# Patient Record
Sex: Male | Born: 1955 | Race: Black or African American | Hispanic: No | Marital: Single | Smoking: Current every day smoker
Health system: Southern US, Community
[De-identification: ages and names within clinical notes are randomized; demographics above are authoritative.]

## PROBLEM LIST (undated history)

## (undated) DIAGNOSIS — J449 Chronic obstructive pulmonary disease, unspecified: Secondary | ICD-10-CM

## (undated) DIAGNOSIS — E119 Type 2 diabetes mellitus without complications: Secondary | ICD-10-CM

## (undated) DIAGNOSIS — I10 Essential (primary) hypertension: Secondary | ICD-10-CM

## (undated) DIAGNOSIS — Z720 Tobacco use: Secondary | ICD-10-CM

## (undated) DIAGNOSIS — I2510 Atherosclerotic heart disease of native coronary artery without angina pectoris: Secondary | ICD-10-CM

## (undated) DIAGNOSIS — K219 Gastro-esophageal reflux disease without esophagitis: Secondary | ICD-10-CM

## (undated) DIAGNOSIS — M719 Bursopathy, unspecified: Secondary | ICD-10-CM

## (undated) DIAGNOSIS — C3490 Malignant neoplasm of unspecified part of unspecified bronchus or lung: Secondary | ICD-10-CM

## (undated) DIAGNOSIS — Z8546 Personal history of malignant neoplasm of prostate: Secondary | ICD-10-CM

## (undated) DIAGNOSIS — I739 Peripheral vascular disease, unspecified: Secondary | ICD-10-CM

## (undated) DIAGNOSIS — E785 Hyperlipidemia, unspecified: Secondary | ICD-10-CM

## (undated) HISTORY — DX: Gastro-esophageal reflux disease without esophagitis: K21.9

## (undated) HISTORY — DX: Peripheral vascular disease, unspecified: I73.9

## (undated) HISTORY — DX: Essential (primary) hypertension: I10

## (undated) HISTORY — DX: Hyperlipidemia, unspecified: E78.5

## (undated) HISTORY — DX: Atherosclerotic heart disease of native coronary artery without angina pectoris: I25.10

## (undated) HISTORY — DX: Personal history of malignant neoplasm of prostate: Z85.46

## (undated) HISTORY — DX: Tobacco use: Z72.0

---

## 2011-11-01 HISTORY — PX: STENT PLACEMENT VASCULAR (ARMC HX): HXRAD1737

## 2013-03-31 HISTORY — PX: FEMORAL ARTERY STENT: SHX1583

## 2014-12-30 HISTORY — PX: CORONARY STENT PLACEMENT: SHX1402

## 2015-10-23 ENCOUNTER — Ambulatory Visit (INDEPENDENT_AMBULATORY_CARE_PROVIDER_SITE_OTHER): Payer: Medicare Other | Admitting: Urology

## 2015-10-23 DIAGNOSIS — N50811 Right testicular pain: Secondary | ICD-10-CM | POA: Diagnosis not present

## 2015-10-23 DIAGNOSIS — C61 Malignant neoplasm of prostate: Secondary | ICD-10-CM

## 2015-10-23 DIAGNOSIS — N5201 Erectile dysfunction due to arterial insufficiency: Secondary | ICD-10-CM | POA: Diagnosis not present

## 2015-11-26 ENCOUNTER — Ambulatory Visit: Payer: Medicare Other

## 2015-11-26 DIAGNOSIS — M25512 Pain in left shoulder: Secondary | ICD-10-CM

## 2015-12-10 ENCOUNTER — Ambulatory Visit: Payer: Medicare Other | Admitting: Orthopaedic Surgery

## 2015-12-10 ENCOUNTER — Encounter: Payer: Self-pay | Admitting: Orthopaedic Surgery

## 2015-12-23 ENCOUNTER — Ambulatory Visit (INDEPENDENT_AMBULATORY_CARE_PROVIDER_SITE_OTHER): Payer: Medicare Other | Admitting: Orthopaedic Surgery

## 2015-12-23 ENCOUNTER — Encounter: Payer: Self-pay | Admitting: Orthopaedic Surgery

## 2015-12-23 VITALS — BP 135/80 | HR 89 | Temp 97.9°F | Ht 68.0 in | Wt 166.8 lb

## 2015-12-23 DIAGNOSIS — M25512 Pain in left shoulder: Secondary | ICD-10-CM | POA: Diagnosis not present

## 2015-12-23 MED ORDER — HYDROCODONE-ACETAMINOPHEN 5-325 MG PO TABS: 1.0000 | ORAL_TABLET | ORAL | Status: DC | PRN

## 2015-12-23 MED ORDER — DICLOFENAC SODIUM 75 MG PO TBEC: 75.0000 mg | DELAYED_RELEASE_TABLET | Freq: Two times a day (BID) | ORAL | Status: DC

## 2015-12-23 NOTE — Progress Notes (Signed)
Patient Fred Hernandez:OINOM Womac, male DOB:Nov 10, 1955, 60 y.o. VEH:[id number]  Chief Complaint  Patient presents with  . Results    MRI Left shoulder    HPI  Fred Hernandez is a 60 y.o. male who has chronic left shoulder pain for 8 months.  He had a MRI recently at [place].  He continues to have pain;  Shoulder Pain  The pain is present in the left shoulder. This is a chronic problem. The current episode started more than 1 month ago. There has been no history of extremity trauma. The problem occurs daily. The problem has been gradually worsening. The quality of the pain is described as aching and dull. The pain is at a severity of 5/10. The pain is moderate. Associated symptoms include a limited range of motion. Pertinent negatives include no numbness, stiffness or tingling. The symptoms are aggravated by activity and cold. He has tried cold, NSAIDS, oral narcotics and rest for the symptoms. The treatment provided mild relief. His past medical history is significant for diabetes.    Body mass index is 25.37 kg/(m^2).   Review of Systems  Constitutional:       Patient has Diabetes Mellitus. Patient has hypertension. Patient has COPD or shortness of breath. Patient does not have BMI > 35. Patient has current smoking history  Musculoskeletal: Positive for myalgias and arthralgias. Negative for stiffness.  Neurological: Negative for tingling and numbness.    Past Medical History  Diagnosis Date  . Hypertension   . Diabetes [facility])     Past Surgical History  Procedure Laterality Date  . Stent placement vascular ([facility])    . Coronary stent placement      No family history on file.  Social History Social History  Substance Use Topics  . Smoking status: Not on file  . Smokeless tobacco: Not on file  . Alcohol Use: Not on file    Allergies  Allergen Reactions  . Lisinopril Anaphylaxis  . Penicillins Anaphylaxis  . Ace Inhibitors Swelling    Current Outpatient Prescriptions   Medication Sig Dispense Refill  . aspirin 81 MG tablet Take 81 mg by mouth daily.    . clopidogrel (PLAVIX) 75 MG tablet Take 75 mg by mouth daily.    . metFORMIN (GLUCOPHAGE) 500 MG tablet Take 500 mg by mouth 2 (two) times daily with a meal.    . metoprolol tartrate (LOPRESSOR) 25 MG tablet Take 25 mg by mouth 2 (two) times daily.    . Omega-3 Fatty Acids (FISH OIL PO) Take by mouth.    . ranitidine (ZANTAC) 150 MG capsule Take 150 mg by mouth 2 (two) times daily.    . diclofenac (VOLTAREN) 75 MG EC tablet Take 1 tablet (75 mg total) by mouth 2 (two) times daily with a meal. 60 tablet 2  . HYDROcodone-acetaminophen (NORCO/VICODIN) 5-325 MG tablet Take 1 tablet by mouth every 4 (four) hours as needed for moderate pain (Must last 30 days.  Do not take and drive a car or use machinery.). 120 tablet 0   No current facility-administered medications for this visit.     Physical Exam  Blood pressure 135/80, pulse 89, temperature 97.9 F (36.6 C), height '5\' 8"'$  (1.727 m), weight 166 lb 12.8 oz (75.66 kg).  Constitutional: overall normal hygiene, normal nutrition, well developed, normal grooming, normal body habitus. Assistive device:none  Musculoskeletal: gait and station Limp none, muscle tone and strength are normal, no tremors or atrophy is present.  .  Neurological: coordination overall normal.  Deep tendon reflex/nerve stretch intact.  Sensation normal.  Cranial nerves II-XII intact.   Skin:   normal overall no scars, lesions, ulcers or rashes. No psoriasis.  Psychiatric: Alert and oriented x 3.  Recent memory intact, remote memory unclear.  Normal mood and affect. Well groomed.  Good eye contact.  Cardiovascular: overall no swelling, no varicosities, no edema bilaterally, normal temperatures of the legs and arms, no clubbing, cyanosis and good capillary refill.  Lymphatic: palpation is normal.  Examination of left Upper Extremity is done.  Inspection:   Overall:  Elbow non-tender  without crepitus or defects, forearm non-tender without crepitus or defects, wrist non-tender without crepitus or defects, hand non-tender.    Shoulder: with glenohumeral joint tenderness, without effusion.   Upper arm: without swelling and tenderness   Range of motion:   Overall:  Full range of motion of the elbow, full range of motion of wrist and full range of motion in fingers.   Shoulder:  left  100 degrees forward flexion; 75 degrees abduction; 30 degrees internal rotation, 30 degrees external rotation, 15 degrees extension, 40 degrees adduction.   Stability:   Overall:  Shoulder, elbow and wrist stable   Strength and Tone:   Overall full shoulder muscles strength, full upper arm strength and normal upper arm bulk and tone. Extremities:the right shoulder has full motion, no pain Inspection normal right shoulder Strength and tone normal right shoulder Range of motion full right shoulder  Additional services performed: I reviewed the MRI from [facility] and the report.  I went over the findings with him.  It was normal.  I will begin PT for him.  He has had medicines, injections, normal x-rays and normal MRI.  He continues to have pain.  I will switch to diclofenac as his Mobic has not helped.  I have gone over exercises with him as well.  He needs to cut back on smoking. He does not feel he can.  His diabetes is well controlled.  He is monitoring it well.  The patient has been educated about the nature of the problem(s) and counseled on treatment options.  The patient appeared to understand what I have discussed and is in agreement with it.  PLAN Call if any problems.  Precautions discussed.  Continue current medications.   Return to clinic 2 wks

## 2015-12-23 NOTE — Patient Instructions (Addendum)
Call [facility] in [place] to schedule your therapy visits  Stop Meloxicam, start Diclofenac  Use heat for comfort, no more than 30 minutes at a time

## 2015-12-30 ENCOUNTER — Ambulatory Visit: Payer: Medicare Other | Admitting: Orthopaedic Surgery

## 2016-01-01 ENCOUNTER — Encounter: Payer: Self-pay | Admitting: Orthopaedic Surgery

## 2016-01-06 ENCOUNTER — Ambulatory Visit: Payer: Medicare Other | Admitting: Orthopaedic Surgery

## 2016-01-14 ENCOUNTER — Other Ambulatory Visit: Payer: Self-pay | Admitting: Orthopaedic Surgery

## 2016-01-14 DIAGNOSIS — M25512 Pain in left shoulder: Secondary | ICD-10-CM

## 2016-01-22 ENCOUNTER — Ambulatory Visit (INDEPENDENT_AMBULATORY_CARE_PROVIDER_SITE_OTHER): Payer: Medicare Other | Admitting: Urology

## 2016-01-22 DIAGNOSIS — C61 Malignant neoplasm of prostate: Secondary | ICD-10-CM

## 2016-01-22 DIAGNOSIS — N50811 Right testicular pain: Secondary | ICD-10-CM | POA: Diagnosis not present

## 2016-01-22 DIAGNOSIS — N5201 Erectile dysfunction due to arterial insufficiency: Secondary | ICD-10-CM

## 2016-01-26 ENCOUNTER — Ambulatory Visit (INDEPENDENT_AMBULATORY_CARE_PROVIDER_SITE_OTHER): Payer: Medicare Other | Admitting: Orthopaedic Surgery

## 2016-01-26 ENCOUNTER — Encounter: Payer: Self-pay | Admitting: Orthopaedic Surgery

## 2016-01-26 VITALS — BP 133/77 | HR 78 | Temp 97.5°F | Resp 16 | Ht 68.0 in | Wt 170.6 lb

## 2016-01-26 DIAGNOSIS — M25512 Pain in left shoulder: Secondary | ICD-10-CM | POA: Diagnosis not present

## 2016-01-26 MED ORDER — HYDROCODONE-ACETAMINOPHEN 5-325 MG PO TABS: 1.0000 | ORAL_TABLET | ORAL | Status: DC | PRN

## 2016-01-26 NOTE — Progress Notes (Signed)
Patient Fred Hernandez, male DOB:Aug 03, 1956, 60 y.o. EXB:[id number]  Chief Complaint  Patient presents with  . Shoulder Pain    left    HPI  Fred Hernandez is a 60 y.o. male who has left shoulder pain. He has been going to PT and is making some progress.  His MRI done prior was negative.  He has some exercises to do at home and he has started doing them.  He has no new trauma.He has no paresthesia.  He says he sees some improvement.  He has no new pains.  HPI  Body mass index is 25.95 kg/(m^2).  Review of Systems  Constitutional:       Patient has Diabetes Mellitus. Patient has hypertension. Patient has COPD or shortness of breath. Patient does not have BMI > 35. Patient has current smoking history  HENT: Negative for congestion.   Cardiovascular: Negative for chest pain.  Endocrine: Positive for cold intolerance.  Musculoskeletal: Positive for myalgias and arthralgias.  Allergic/Immunologic: Positive for environmental allergies.  Neurological: Negative for numbness.    Past Medical History  Diagnosis Date  . Hypertension   . Diabetes [facility])     Past Surgical History  Procedure Laterality Date  . Stent placement vascular ([facility])    . Coronary stent placement      No family history on file.  Social History Social History  Substance Use Topics  . Smoking status: Current Every Day Smoker  . Smokeless tobacco: Never Used  . Alcohol Use: 0.0 oz/week    0 Standard drinks or equivalent per week     Comment: socially    Allergies  Allergen Reactions  . Lisinopril Anaphylaxis  . Penicillins Anaphylaxis  . Ace Inhibitors Swelling    Current Outpatient Prescriptions  Medication Sig Dispense Refill  . aspirin 81 MG tablet Take 81 mg by mouth daily.    [name] atorvastatin (LIPITOR) 80 MG tablet TK 1 T PO QD HS  1  . clopidogrel (PLAVIX) 75 MG tablet Take 75 mg by mouth daily.    . diclofenac (VOLTAREN) 75 MG EC tablet Take 1 tablet (75 mg total) by mouth 2 (two) times daily  with a meal. 60 tablet 2  . fluticasone (FLONASE) 50 MCG/ACT nasal spray SHAKE LQ AND U 1 SPR IEN QD  3  . HYDROcodone-acetaminophen (NORCO/VICODIN) 5-325 MG tablet Take 1 tablet by mouth every 4 (four) hours as needed for moderate pain (Must last 30 days.  Do not take and drive a car or use machinery.). 120 tablet 0  . metFORMIN (GLUCOPHAGE) 500 MG tablet Take 500 mg by mouth 2 (two) times daily with a meal.    . metoprolol tartrate (LOPRESSOR) 25 MG tablet Take 25 mg by mouth 2 (two) times daily.    . Omega-3 Fatty Acids (FISH OIL PO) Take by mouth.    [name] omeprazole (PRILOSEC) 20 MG capsule TK 2 CS PO QD  1  . ranitidine (ZANTAC) 150 MG capsule Take 150 mg by mouth 2 (two) times daily.     No current facility-administered medications for this visit.     Physical Exam  Blood pressure 133/77, pulse 78, temperature 97.5 F (36.4 C), temperature source Tympanic, resp. rate 16, height '5\' 8"'$  (1.727 m), weight 170 lb 9.6 oz (77.384 kg).  Constitutional: overall normal hygiene, normal nutrition, well developed, normal grooming, normal body habitus. Assistive device:none  Musculoskeletal: gait and station Limp none, muscle tone and strength are normal, no tremors or atrophy is present.  [name]  Neurological: coordination overall normal.  Deep tendon reflex/nerve stretch intact.  Sensation normal.  Cranial nerves II-XII intact.   Skin:   normal overall no scars, lesions, ulcers or rashes. No psoriasis.  Psychiatric: Alert and oriented x 3.  Recent memory intact, remote memory unclear.  Normal mood and affect. Well groomed.  Good eye contact.  Cardiovascular: overall no swelling, no varicosities, no edema bilaterally, normal temperatures of the legs and arms, no clubbing, cyanosis and good capillary refill.  Lymphatic: palpation is normal.  Examination of left Upper Extremity is done.  Inspection:   Overall:  Elbow non-tender without crepitus or defects, forearm non-tender without crepitus or  defects, wrist non-tender without crepitus or defects, hand non-tender.    Shoulder: with glenohumeral joint tenderness, without effusion.   Upper arm: without swelling and tenderness   Range of motion:   Overall:  Full range of motion of the elbow, full range of motion of wrist and full range of motion in fingers.   Shoulder:  left  135 degrees forward flexion; 100 degrees abduction; 35 degrees internal rotation, 35 degrees external rotation, 20 degrees extension, 40 degrees adduction.   Stability:   Overall:  Shoulder, elbow and wrist stable   Strength and Tone:   Overall full shoulder muscles strength, full upper arm strength and normal upper arm bulk and tone.   The patient has been educated about the nature of the problem(s) and counseled on treatment options.  The patient appeared to understand what I have discussed and is in agreement with it.  Encounter Diagnosis  Name Primary?  . Left shoulder pain Yes    PLAN Call if any problems.  Precautions discussed.  Continue current medications.   Continue PT.  Return to clinic 3 weeks

## 2016-02-09 ENCOUNTER — Telehealth: Payer: Self-pay | Admitting: Orthopaedic Surgery

## 2016-02-09 NOTE — Telephone Encounter (Signed)
Routing to DR. Luna Glasgow

## 2016-02-09 NOTE — Telephone Encounter (Signed)
[facility] representative called and stated they are in need of new PT order.  Please fax to [phone]

## 2016-02-10 NOTE — Telephone Encounter (Signed)
Do it

## 2016-02-16 ENCOUNTER — Ambulatory Visit: Payer: Medicare Other | Admitting: Orthopaedic Surgery

## 2016-02-18 ENCOUNTER — Encounter: Payer: Self-pay | Admitting: *Deleted

## 2016-02-22 ENCOUNTER — Encounter: Payer: Self-pay | Admitting: Cardiovascular Disease

## 2016-02-22 ENCOUNTER — Ambulatory Visit (INDEPENDENT_AMBULATORY_CARE_PROVIDER_SITE_OTHER): Payer: Medicare Other | Admitting: Cardiovascular Disease

## 2016-02-22 ENCOUNTER — Encounter: Payer: Self-pay | Admitting: *Deleted

## 2016-02-22 VITALS — BP 118/77 | HR 71 | Ht 69.0 in | Wt 178.0 lb

## 2016-02-22 DIAGNOSIS — I739 Peripheral vascular disease, unspecified: Secondary | ICD-10-CM

## 2016-02-22 DIAGNOSIS — I25810 Atherosclerosis of coronary artery bypass graft(s) without angina pectoris: Secondary | ICD-10-CM | POA: Diagnosis not present

## 2016-02-22 DIAGNOSIS — Z955 Presence of coronary angioplasty implant and graft: Secondary | ICD-10-CM

## 2016-02-22 DIAGNOSIS — E785 Hyperlipidemia, unspecified: Secondary | ICD-10-CM | POA: Diagnosis not present

## 2016-02-22 DIAGNOSIS — I2510 Atherosclerotic heart disease of native coronary artery without angina pectoris: Secondary | ICD-10-CM | POA: Diagnosis not present

## 2016-02-22 DIAGNOSIS — Z716 Tobacco abuse counseling: Secondary | ICD-10-CM

## 2016-02-22 NOTE — Patient Instructions (Signed)
   Referral to Dr. Gwenlyn Found or Dr. Fletcher Anon for peripheral vascular disease. Continue all current medications. Your physician has requested that you have an ankle brachial index (ABI). During this test an ultrasound and blood pressure cuff are used to evaluate the arteries that supply the arms and legs with blood. Allow thirty minutes for this exam. There are no restrictions or special instructions. Office will contact with results via phone or letter.   Your physician wants you to follow up in:  1 year.  You will receive a reminder letter in the mail one-two months in advance.  If you don't receive a letter, please call our office to schedule the follow up appointment

## 2016-02-22 NOTE — Progress Notes (Signed)
Patient ID: Fred Hernandez, male   DOB: 07/22/1956, 60 y.o.   MRN: [id number]       CARDIOLOGY CONSULT NOTE  Patient ID: Fred Hernandez MRN: [id number] DOB/AGE: [birth year] 60 y.o.  Admit date: (Not on file) Primary Physician: [facility] Referring Physician:   Reason for Consultation: PVD  HPI: The patientIs a 60 year old male with a history of coronary artery disease and stent placement (?artery) in March 2016, diabetes, hyperlipidemia, and tobacco abuse. He is referred today for right thigh and calf pain with walking.  He apparently has also undergone stenting of both legs. I do not have these records and it was reportedly performed in [place], [place]. He denies exertional chest pain. He has chronic exertional dyspnea which is stable. He smokes 1.5 packs of cigarettes daily. For the past one month, he has had tightening and cramping in his right calf and his right hamstring with walking. He said he can't stop smoking.  ECG performed in the office today which I personally reviewed demonstrates normal sinus rhythm with no ischemic ST segment or T-wave abnormalities, nor any arrhythmias.     Allergies  Allergen Reactions  . Lisinopril Anaphylaxis  . Penicillins Anaphylaxis  . Ace Inhibitors Swelling    Current Outpatient Prescriptions  Medication Sig Dispense Refill  . aspirin 81 MG tablet Take 81 mg by mouth daily.    [name] atorvastatin (LIPITOR) 80 MG tablet Take 80 mg by mouth daily.    . clopidogrel (PLAVIX) 75 MG tablet Take 75 mg by mouth daily.    . fluticasone (FLONASE) 50 MCG/ACT nasal spray SHAKE LQ AND U 1 SPR IEN QD  3  . meloxicam (MOBIC) 7.5 MG tablet Take 1 tablet by mouth daily.  0  . metFORMIN (GLUCOPHAGE) 500 MG tablet Take 500 mg by mouth 2 (two) times daily with a meal.    . metoprolol succinate (TOPROL-XL) 50 MG 24 hr tablet Take 1 tablet by mouth daily.    . Omega-3 Fatty Acids (FISH OIL PO) Take 2 capsules by mouth 2 (two) times daily.      [name] omeprazole (PRILOSEC) 20 MG capsule Take 40 mg by mouth daily.     No current facility-administered medications for this visit.    Past Medical History  Diagnosis Date  . Hypertension   . Diabetes ([place])   . GERD (gastroesophageal reflux disease)   . History of prostate cancer   . CAD (coronary artery disease)   . Hyperlipemia   . Tobacco abuse     Past Surgical History  Procedure Laterality Date  . Stent placement vascular ([facility])  2013  . Coronary stent placement  12-2014    [facility]    Social History   Social History  . Marital Status: Single    Spouse Name: N/A  . Number of Children: N/A  . Years of Education: N/A   Occupational History  . Not on file.   Social History Main Topics  . Smoking status: Current Every Day Smoker -- 0.50 packs/day    Types: Cigarettes    Start date: 02/12/1971  . Smokeless tobacco: Never Used  . Alcohol Use: 0.0 oz/week    0 Standard drinks or equivalent per week     Comment: socially  . Drug Use: No  . Sexual Activity: Not on file   Other Topics Concern  . Not on file   Social History Narrative     No family history of premature CAD  in 1st degree relatives.  Prior to Admission medications   Medication Sig Start Date End Date Taking? Authorizing Provider  aspirin 81 MG tablet Take 81 mg by mouth daily.   Yes Historical Provider, MD  atorvastatin (LIPITOR) 80 MG tablet Take 80 mg by mouth daily.   Yes Historical Provider, MD  clopidogrel (PLAVIX) 75 MG tablet Take 75 mg by mouth daily.   Yes Historical Provider, MD  fluticasone (FLONASE) 50 MCG/ACT nasal spray SHAKE LQ AND U 1 SPR IEN QD 01/04/16  Yes Historical Provider, MD  meloxicam (MOBIC) 7.5 MG tablet Take 1 tablet by mouth daily. 01/27/16  Yes Historical Provider, MD  metFORMIN (GLUCOPHAGE) 500 MG tablet Take 500 mg by mouth 2 (two) times daily with a meal.   Yes Historical Provider, MD  metoprolol succinate (TOPROL-XL) 50 MG 24 hr tablet  Take 1 tablet by mouth daily. 02/08/16  Yes Historical Provider, MD  Omega-3 Fatty Acids (FISH OIL PO) Take 2 capsules by mouth 2 (two) times daily.    Yes Historical Provider, MD  omeprazole (PRILOSEC) 20 MG capsule Take 40 mg by mouth daily.   Yes Historical Provider, MD     Review of systems complete and found to be negative unless listed above in HPI     Physical exam Blood pressure 118/77, pulse 71, height '5\' 9"'$  (1.753 m), weight 178 lb (80.74 kg), SpO2 98 %. General: NAD Neck: No JVD, no thyromegaly or thyroid nodule.  Lungs: Diminished throughout, no rales/wheezes. CV: Nondisplaced PMI. Regular rate and rhythm, normal S1/S2, no S3/S4, no murmur.  No peripheral edema. Diminished pedal pulses on right, right calf cooler to palpation than left.  Abdomen: Soft, nontender, no distention.  Skin: Intact without lesions or rashes.  Neurologic: Alert and oriented x 3.  Psych: Normal affect. Extremities: No clubbing or cyanosis.  HEENT: Normal.   ECG: Most recent ECG reviewed.  Labs:  No results found for: WBC, HGB, HCT, MCV, PLT No results for input(s): NA, K, CL, CO2, BUN, CREATININE, CALCIUM, PROT, BILITOT, ALKPHOS, ALT, AST, GLUCOSE in the last 168 hours.  Invalid input(s): LABALBU No results found for: CKTOTAL, CKMB, CKMBINDEX, TROPONINI No results found for: CHOL No results found for: HDL No results found for: LDLCALC No results found for: TRIG No results found for: CHOLHDL No results found for: LDLDIRECT       Studies: No results found.  ASSESSMENT AND PLAN:  1. Right thigh and calf claudication in context of PVD with prior stenting: Will likely need angiography. I will obtain ABI's.  Continue aspirin, Plavix, and statin. I will make a referral to my colleagues who specialize in peripheral vascular disease. Tobacco cessation emphasized. Will obtain records from [place] to determine where stents were placed.  2. CAD with stent: Stable. Continue aspirin, statin, and  Plavix for the time being. Will obtain records from [facility] to determine which coronary artery was stented.  3. Hyperlipidemia: Continue high intensity statin therapy with Lipitor 80 mg.  4. Tobacco abuse: Cessation counseling provided (1 minute). Says he cannot quit.  Dispo: fu 1 year.   Signed: Kate Sable, M.D., F.A.C.C.  02/22/2016, 1:48 PM

## 2016-02-22 NOTE — Addendum Note (Signed)
Addended by: Laurine Blazer on: 02/22/2016 02:15 PM   Modules accepted: Orders

## 2016-02-24 ENCOUNTER — Encounter: Payer: Self-pay | Admitting: Orthopaedic Surgery

## 2016-02-24 ENCOUNTER — Ambulatory Visit (INDEPENDENT_AMBULATORY_CARE_PROVIDER_SITE_OTHER): Payer: Medicare Other | Admitting: Orthopaedic Surgery

## 2016-02-24 VITALS — BP 124/77 | HR 80 | Temp 97.7°F | Resp 16 | Ht 69.0 in | Wt 178.0 lb

## 2016-02-24 DIAGNOSIS — M25512 Pain in left shoulder: Secondary | ICD-10-CM | POA: Diagnosis not present

## 2016-02-24 MED ORDER — HYDROCODONE-ACETAMINOPHEN 5-325 MG PO TABS: 1.0000 | ORAL_TABLET | ORAL | Status: DC | PRN

## 2016-02-24 NOTE — Progress Notes (Signed)
Patient [name], male DOB:Jan 22, 1956, 60 y.o. JOA:[id number]  Chief Complaint  Patient presents with  . Follow-up    left shoulder pain    HPI  Fred Hernandez is a 60 y.o. male who has had left shoulder pain. He has been to PT/OT and is doing well with the left shoulder.  He has full motion and almost no pain now.  The right shoulder is bothering him now.  He is doing the exercises he learned in PT for the right shoulder.  He has some popping and some tenderness at night.  He has no paresthesias or new trauma. He is taking his medicine and using ice.  HPI  Body mass index is 26.27 kg/(m^2).  Review of Systems  Constitutional:       Patient has Diabetes Mellitus. Patient has hypertension. Patient has COPD or shortness of breath. Patient does not have BMI > 35. Patient has current smoking history  HENT: Negative for congestion.   Cardiovascular: Negative for chest pain.  Endocrine: Positive for cold intolerance.  Musculoskeletal: Positive for myalgias and arthralgias.  Allergic/Immunologic: Positive for environmental allergies.  Neurological: Negative for numbness.    Past Medical History  Diagnosis Date  . Hypertension   . Diabetes ([place])   . GERD (gastroesophageal reflux disease)   . History of prostate cancer   . CAD (coronary artery disease)   . Hyperlipemia   . Tobacco abuse     Past Surgical History  Procedure Laterality Date  . Stent placement vascular ([facility])  2013  . Coronary stent placement  12-2014    [facility]    Family History  Problem Relation Age of Onset  . Diabetes Mother   . Heart disease Mother   . Hypertension Mother   . Arthritis Brother   . Heart disease Brother   . Arthritis Sister   . Heart disease Sister     Social History Social History  Substance Use Topics  . Smoking status: Current Every Day Smoker -- 0.50 packs/day    Types: Cigarettes    Start date: 02/12/1971  . Smokeless tobacco: Never Used  .  Alcohol Use: 0.0 oz/week    0 Standard drinks or equivalent per week     Comment: socially    Allergies  Allergen Reactions  . Lisinopril Anaphylaxis  . Penicillins Anaphylaxis  . Ace Inhibitors Swelling    Current Outpatient Prescriptions  Medication Sig Dispense Refill  . aspirin 81 MG tablet Take 81 mg by mouth daily.    [name] atorvastatin (LIPITOR) 80 MG tablet Take 80 mg by mouth daily.    . clopidogrel (PLAVIX) 75 MG tablet Take 75 mg by mouth daily.    . fluticasone (FLONASE) 50 MCG/ACT nasal spray SHAKE LQ AND U 1 SPR IEN QD  3  . meloxicam (MOBIC) 7.5 MG tablet Take 1 tablet by mouth daily.  0  . metFORMIN (GLUCOPHAGE) 500 MG tablet Take 500 mg by mouth 2 (two) times daily with a meal.    . metoprolol succinate (TOPROL-XL) 50 MG 24 hr tablet Take 1 tablet by mouth daily.    . Omega-3 Fatty Acids (FISH OIL PO) Take 2 capsules by mouth 2 (two) times daily.     [name] omeprazole (PRILOSEC) 20 MG capsule Take 40 mg by mouth daily.    [name] HYDROcodone-acetaminophen (NORCO/VICODIN) 5-325 MG tablet Take 1 tablet by mouth every 4 (four) hours as needed for moderate pain (Must last 30 days.  Do not take and  drive a car or use machinery.). 120 tablet 0   No current facility-administered medications for this visit.     Physical Exam  Blood pressure 124/77, pulse 80, temperature 97.7 F (36.5 C), resp. rate 16, height '5\' 9"'$  (1.753 m), weight 178 lb (80.74 kg).  Constitutional: overall normal hygiene, normal nutrition, well developed, normal grooming, normal body habitus. Assistive device:none  Musculoskeletal: gait and station Limp none, muscle tone and strength are normal, no tremors or atrophy is present.  .  Neurological: coordination overall normal.  Deep tendon reflex/nerve stretch intact.  Sensation normal.  Cranial nerves II-XII intact.   Skin:   normal overall no scars, lesions, ulcers or rashes. No psoriasis.  Psychiatric: Alert and oriented x 3.  Recent memory intact, remote  memory unclear.  Normal mood and affect. Well groomed.  Good eye contact.  Cardiovascular: overall no swelling, no varicosities, no edema bilaterally, normal temperatures of the legs and arms, no clubbing, cyanosis and good capillary refill.  Lymphatic: palpation is normal. Right Shoulder Exam   Tenderness  The patient is experiencing tenderness in the acromioclavicular joint.  Muscle Strength  The patient has normal right shoulder strength.  Tests  Apprehension: negative  Other  Erythema: absent Scars: absent Sensation: normal Pulse: present  Comments:  He has full motion but the right shoulder is tender now.  He has no crepitus. He has no redness.  It is just sore.   Left Shoulder Exam  Left shoulder exam is normal.  Range of Motion  The patient has normal left shoulder ROM.  Muscle Strength  The patient has normal left shoulder strength.  Tests  Apprehension: positive  Other  Erythema: absent Scars: absent Sensation: normal Pulse: present       The patient has been educated about the nature of the problem(s) and counseled on treatment options.  The patient appeared to understand what I have discussed and is in agreement with it.  Encounter Diagnosis  Name Primary?  . Left shoulder pain Yes    PLAN Call if any problems.  Precautions discussed.  Continue current medications.   Return to clinic 6 weeks

## 2016-02-25 ENCOUNTER — Other Ambulatory Visit: Payer: Self-pay | Admitting: Cardiovascular Disease

## 2016-02-25 DIAGNOSIS — I739 Peripheral vascular disease, unspecified: Secondary | ICD-10-CM

## 2016-03-08 ENCOUNTER — Encounter: Payer: Medicare Other | Admitting: Cardiovascular Disease

## 2016-03-10 ENCOUNTER — Ambulatory Visit: Payer: Medicare Other

## 2016-03-10 DIAGNOSIS — I739 Peripheral vascular disease, unspecified: Secondary | ICD-10-CM | POA: Diagnosis not present

## 2016-03-14 ENCOUNTER — Telehealth: Payer: Self-pay | Admitting: *Deleted

## 2016-03-14 NOTE — Telephone Encounter (Signed)
Notes Recorded by Laurine Blazer, LPN on 5/46/5681 at 2:75 PM Fiance Royden Purl) notified. Copy to pmd.  Notes Recorded by [name], MD on 03/11/2016 at 1:49 PM Occluded right SFA stent. Already has appt scheduled with Dr. Gwenlyn Found.

## 2016-03-16 ENCOUNTER — Encounter: Payer: Self-pay | Admitting: Cardiovascular Disease

## 2016-03-16 ENCOUNTER — Ambulatory Visit (INDEPENDENT_AMBULATORY_CARE_PROVIDER_SITE_OTHER): Payer: Medicare Other | Admitting: Cardiovascular Disease

## 2016-03-16 ENCOUNTER — Other Ambulatory Visit: Payer: Self-pay | Admitting: *Deleted

## 2016-03-16 VITALS — BP 128/80 | HR 78 | Ht 69.0 in | Wt 170.4 lb

## 2016-03-16 DIAGNOSIS — I739 Peripheral vascular disease, unspecified: Secondary | ICD-10-CM

## 2016-03-16 DIAGNOSIS — Z01818 Encounter for other preprocedural examination: Secondary | ICD-10-CM

## 2016-03-16 DIAGNOSIS — Z79899 Other long term (current) drug therapy: Secondary | ICD-10-CM

## 2016-03-16 DIAGNOSIS — I779 Disorder of arteries and arterioles, unspecified: Secondary | ICD-10-CM | POA: Diagnosis not present

## 2016-03-16 NOTE — Assessment & Plan Note (Signed)
Fred Hernandez presents now as a referral from Dr. Bronson Ing  for evaluation of symptomatic. PAD. He has a history of bilateral reduction restenting in [place] in 2013 which resulted in improvement in his claudication symptoms. Her Lasix months he's had recurrent symptoms. Dopplers performed 03/10/16 the right ABI 0.67 with an occluded right SFA. We will arrange for him to undergo angiography and potential intervention for lifestyle limiting claudication.

## 2016-03-16 NOTE — Progress Notes (Signed)
03/16/2016 Fred Hernandez   Apr 05, 1956  [id number]  Primary [facility] Primary Cardiologist: Lorretta Harp MD Renae Gloss   HPI:  Fred Hernandez is a 60 year old engaged African-American male with no children accompanied by his fiancs [name]. He was referred by Dr. Bronson Ing for evaluation of symptomatic PAD. He has a history of 75 years of tobacco abuse smoking 1-1/2 packs per day In addition, he has treated hypertension, hyperlipidemia and diabetes. He did have a stent placed in his coronary arteries Daniel 2016 after positive stress test. He's had recurrent claudication over the last 6 months in his right lower extremity. Dopplers performed 03/10/16 revealed a right ABI of 0.67 with occluded right SFA.   Current Outpatient Prescriptions  Medication Sig Dispense Refill  . aspirin 81 MG tablet Take 81 mg by mouth daily.    [name] atorvastatin (LIPITOR) 80 MG tablet Take 80 mg by mouth daily.    . clopidogrel (PLAVIX) 75 MG tablet Take 75 mg by mouth daily.    . fluticasone (FLONASE) 50 MCG/ACT nasal spray SHAKE LQ AND U 1 SPR IEN QD  3  . HYDROcodone-acetaminophen (NORCO/VICODIN) 5-325 MG tablet Take 1 tablet by mouth every 4 (four) hours as needed for moderate pain (Must last 30 days.  Do not take and drive a car or use machinery.). 120 tablet 0  . metFORMIN (GLUCOPHAGE) 500 MG tablet Take 500 mg by mouth 2 (two) times daily with a meal.    . metoprolol succinate (TOPROL-XL) 50 MG 24 hr tablet Take 1 tablet by mouth daily.    . Omega-3 Fatty Acids (FISH OIL PO) Take 2 capsules by mouth 2 (two) times daily.     [name] omeprazole (PRILOSEC) 20 MG capsule Take 40 mg by mouth daily.     No current facility-administered medications for this visit.    Allergies  Allergen Reactions  . Lisinopril Anaphylaxis  . Penicillins Anaphylaxis  . Ace Inhibitors Swelling    Social History   Social History  . Marital Status: Single    Spouse Name: N/A  .  Number of Children: N/A  . Years of Education: N/A   Occupational History  . Not on file.   Social History Main Topics  . Smoking status: Current Every Day Smoker -- 0.50 packs/day    Types: Cigarettes    Start date: 02/12/1971  . Smokeless tobacco: Never Used  . Alcohol Use: 0.0 oz/week    0 Standard drinks or equivalent per week     Comment: socially  . Drug Use: No  . Sexual Activity: Not on file   Other Topics Concern  . Not on file   Social History Narrative     Review of Systems: General: negative for chills, fever, night sweats or weight changes.  Cardiovascular: negative for chest pain, dyspnea on exertion, edema, orthopnea, palpitations, paroxysmal nocturnal dyspnea or shortness of breath Dermatological: negative for rash Respiratory: negative for cough or wheezing Urologic: negative for hematuria Abdominal: negative for nausea, vomiting, diarrhea, bright red blood per rectum, melena, or hematemesis Neurologic: negative for visual changes, syncope, or dizziness All other systems reviewed and are otherwise negative except as noted above.    Blood pressure 128/80, pulse 78, height '5\' 9"'$  (1.753 m), weight 170 lb 6 oz (77.282 kg).  General appearance: alert and no distress Neck: no adenopathy, no JVD, supple, symmetrical, trachea midline, thyroid not enlarged, symmetric, no tenderness/mass/nodules and oft right carotid bruit Lungs: clear to auscultation  bilaterally Heart: regular rate and rhythm, S1, S2 normal, no murmur, click, rub or gallop Extremities: 2+ left pedal pulse, absent right pedal pulse  EKG not performed today  ASSESSMENT AND PLAN:   Peripheral arterial disease ([place]) Fred. Eyer presents now as a referral from Dr. Bronson Ing  for evaluation of symptomatic. PAD. He has a history of bilateral reduction restenting in [place] in 2013 which resulted in improvement in his claudication symptoms. Her Lasix months he's had recurrent symptoms. Dopplers  performed 03/10/16 the right ABI 0.67 with an occluded right SFA. We will arrange for him to undergo angiography and potential intervention for lifestyle limiting claudication.      Lorretta Harp MD FACP,FACC,FAHA, [facility] 03/16/2016 1:46 PM

## 2016-03-16 NOTE — Patient Instructions (Addendum)
Medication Instructions:  Your physician recommends that you continue on your current medications as directed. Please refer to the Current Medication list given to you today.   Labwork: Your physician recommends that you return for lab work WITHIN 14 DAYS OF [place].  The lab can be found on the FIRST FLOOR of out building in [address]    Testing/Procedures: Your physician has requested that you have a carotid duplex. This test is an ultrasound of the carotid arteries in your neck. It looks at blood flow through these arteries that supply the brain with blood. Allow one hour for this exam. There are no restrictions or special instructions. BEFORE ANGIOGRAM.   Your physician has requested that you have a peripheral vascular angiogram. This exam is performed at the hospital. During this exam IV contrast is used to look at arterial blood flow. Please review the information sheet given for details. June 5 OR June 8TH.   Following your catheterization, you will not be allowed to drive for 3 days.  No lifting, pushing, or pulling greater that 10 pounds is allowed for 1 week.  You will be required to have the following tests prior to the procedure:  1. Blood work-the blood work can be done no more than 14 days prior to the procedure.  It can be done at any [facility] lab.  There is one downstairs on the first floor of this building and one in the [facility] building [phone]).  2. Chest Xray-the chest xray order has already been placed at the [facility].      Puncture site left groin REPS Scott and Kandy Garrison    Any Other Special Instructions Will Be Listed Below (If Applicable).     If you need a refill on your cardiac medications before your next appointment, please call your pharmacy.

## 2016-03-17 ENCOUNTER — Encounter (HOSPITAL_COMMUNITY): Payer: Medicare Other

## 2016-03-29 ENCOUNTER — Ambulatory Visit
Admission: RE | Admit: 2016-03-29 | Discharge: 2016-03-29 | Disposition: A | Discharge status: Home / Self Care | Payer: Medicare Other | Source: Ambulatory Visit | Attending: Cardiovascular Disease | Admitting: Cardiovascular Disease

## 2016-03-29 ENCOUNTER — Ambulatory Visit (HOSPITAL_COMMUNITY)
Admission: RE | Admit: 2016-03-29 | Discharge: 2016-03-29 | Disposition: A | Discharge status: Home / Self Care | Payer: Medicare Other | Source: Ambulatory Visit | Attending: Cardiovascular Disease | Admitting: Cardiovascular Disease

## 2016-03-29 DIAGNOSIS — K219 Gastro-esophageal reflux disease without esophagitis: Secondary | ICD-10-CM | POA: Diagnosis not present

## 2016-03-29 DIAGNOSIS — Z720 Tobacco use: Secondary | ICD-10-CM | POA: Insufficient documentation

## 2016-03-29 DIAGNOSIS — I6523 Occlusion and stenosis of bilateral carotid arteries: Secondary | ICD-10-CM | POA: Insufficient documentation

## 2016-03-29 DIAGNOSIS — I739 Peripheral vascular disease, unspecified: Secondary | ICD-10-CM | POA: Diagnosis not present

## 2016-03-29 DIAGNOSIS — I779 Disorder of arteries and arterioles, unspecified: Secondary | ICD-10-CM | POA: Diagnosis not present

## 2016-03-29 DIAGNOSIS — I10 Essential (primary) hypertension: Secondary | ICD-10-CM | POA: Diagnosis not present

## 2016-03-29 DIAGNOSIS — E785 Hyperlipidemia, unspecified: Secondary | ICD-10-CM | POA: Diagnosis not present

## 2016-03-29 DIAGNOSIS — Z01818 Encounter for other preprocedural examination: Secondary | ICD-10-CM

## 2016-03-29 DIAGNOSIS — R0989 Other specified symptoms and signs involving the circulatory and respiratory systems: Secondary | ICD-10-CM | POA: Diagnosis not present

## 2016-03-29 DIAGNOSIS — E119 Type 2 diabetes mellitus without complications: Secondary | ICD-10-CM | POA: Insufficient documentation

## 2016-03-29 DIAGNOSIS — I2510 Atherosclerotic heart disease of native coronary artery without angina pectoris: Secondary | ICD-10-CM | POA: Diagnosis not present

## 2016-03-30 ENCOUNTER — Telehealth: Payer: Self-pay | Admitting: Cardiovascular Disease

## 2016-03-30 LAB — CBC WITH DIFFERENTIAL/PLATELET
Basophils Absolute: 96 {cells}/uL (ref 0–200)
Basophils Relative: 1 %
Eosinophils Absolute: 480 {cells}/uL (ref 15–500)
Eosinophils Relative: 5 %
HCT: 45.1 % (ref 38.5–50.0)
Hemoglobin: 14.7 g/dL (ref 13.2–17.1)
Lymphocytes Relative: 28 %
Lymphs Abs: 2688 {cells}/uL (ref 850–3900)
MCH: 28.6 pg (ref 27.0–33.0)
MCHC: 32.6 g/dL (ref 32.0–36.0)
MCV: 87.7 fL (ref 80.0–100.0)
MPV: 9.6 fL (ref 7.5–12.5)
Monocytes Absolute: 672 {cells}/uL (ref 200–950)
Monocytes Relative: 7 %
Neutro Abs: 5664 {cells}/uL (ref 1500–7800)
Neutrophils Relative %: 59 %
Platelets: 346 K/uL (ref 140–400)
RBC: 5.14 MIL/uL (ref 4.20–5.80)
RDW: 14.6 % (ref 11.0–15.0)
WBC: 9.6 K/uL (ref 3.8–10.8)

## 2016-03-30 LAB — BASIC METABOLIC PANEL
BUN: 10 mg/dL (ref 7–25)
CHLORIDE: 104 mmol/L (ref 98–110)
CO2: 24 mmol/L (ref 20–31)
Calcium: 9.5 mg/dL (ref 8.6–10.3)
Creat: 0.86 mg/dL (ref 0.70–1.25)
GLUCOSE: 104 mg/dL — AB (ref 65–99)
POTASSIUM: 4.7 mmol/L (ref 3.5–5.3)
SODIUM: 137 mmol/L (ref 135–146)

## 2016-03-30 LAB — PROTIME-INR
INR: 0.96
Prothrombin Time: 12.9 s (ref 11.6–15.2)

## 2016-03-30 LAB — APTT: aPTT: 33 s (ref 24–37)

## 2016-03-30 LAB — TSH: TSH: 3.6 mIU/L (ref 0.40–4.50)

## 2016-03-30 NOTE — Telephone Encounter (Signed)
New Message  Pt wife calling to clarify all instructions for cardioversion scheduled for 6/1. Please call back and discuss.

## 2016-03-30 NOTE — Telephone Encounter (Signed)
Spoke to Ms Radford Pax, confirmed the correct address to take patient to day for procedure Main entrance "A" at [place]- go to short stay area. Ms Radford Pax verbalized understanding.

## 2016-04-04 ENCOUNTER — Encounter (HOSPITAL_COMMUNITY)
Admission: RE | Disposition: A | Discharge status: Home / Self Care | Payer: Self-pay | Source: Ambulatory Visit | Attending: Cardiovascular Disease

## 2016-04-04 ENCOUNTER — Ambulatory Visit (HOSPITAL_COMMUNITY)
Admission: RE | Admit: 2016-04-04 | Discharge: 2016-04-05 | Disposition: A | Discharge status: Home / Self Care | Payer: Medicare Other | Source: Ambulatory Visit | Attending: Cardiovascular Disease | Admitting: Cardiovascular Disease

## 2016-04-04 DIAGNOSIS — Z720 Tobacco use: Secondary | ICD-10-CM

## 2016-04-04 DIAGNOSIS — I10 Essential (primary) hypertension: Secondary | ICD-10-CM | POA: Diagnosis not present

## 2016-04-04 DIAGNOSIS — Z7982 Long term (current) use of aspirin: Secondary | ICD-10-CM | POA: Insufficient documentation

## 2016-04-04 DIAGNOSIS — Z7984 Long term (current) use of oral hypoglycemic drugs: Secondary | ICD-10-CM | POA: Diagnosis not present

## 2016-04-04 DIAGNOSIS — I70211 Atherosclerosis of native arteries of extremities with intermittent claudication, right leg: Secondary | ICD-10-CM | POA: Insufficient documentation

## 2016-04-04 DIAGNOSIS — E785 Hyperlipidemia, unspecified: Secondary | ICD-10-CM

## 2016-04-04 DIAGNOSIS — F17210 Nicotine dependence, cigarettes, uncomplicated: Secondary | ICD-10-CM | POA: Diagnosis not present

## 2016-04-04 DIAGNOSIS — Y812 Prosthetic and other implants, materials and accessory general- and plastic-surgery devices associated with adverse incidents: Secondary | ICD-10-CM | POA: Insufficient documentation

## 2016-04-04 DIAGNOSIS — Z955 Presence of coronary angioplasty implant and graft: Secondary | ICD-10-CM | POA: Diagnosis not present

## 2016-04-04 DIAGNOSIS — Z880 Allergy status to penicillin: Secondary | ICD-10-CM | POA: Diagnosis not present

## 2016-04-04 DIAGNOSIS — E1151 Type 2 diabetes mellitus with diabetic peripheral angiopathy without gangrene: Secondary | ICD-10-CM | POA: Diagnosis present

## 2016-04-04 DIAGNOSIS — I2510 Atherosclerotic heart disease of native coronary artery without angina pectoris: Secondary | ICD-10-CM | POA: Diagnosis not present

## 2016-04-04 DIAGNOSIS — Z7902 Long term (current) use of antithrombotics/antiplatelets: Secondary | ICD-10-CM | POA: Insufficient documentation

## 2016-04-04 DIAGNOSIS — I7092 Chronic total occlusion of artery of the extremities: Secondary | ICD-10-CM | POA: Diagnosis not present

## 2016-04-04 DIAGNOSIS — I739 Peripheral vascular disease, unspecified: Secondary | ICD-10-CM | POA: Diagnosis present

## 2016-04-04 DIAGNOSIS — T82856A Stenosis of peripheral vascular stent, initial encounter: Secondary | ICD-10-CM | POA: Insufficient documentation

## 2016-04-04 DIAGNOSIS — E1159 Type 2 diabetes mellitus with other circulatory complications: Secondary | ICD-10-CM

## 2016-04-04 HISTORY — PX: PERIPHERAL VASCULAR CATHETERIZATION: SHX172C

## 2016-04-04 HISTORY — DX: Type 2 diabetes mellitus without complications: E11.9

## 2016-04-04 LAB — GLUCOSE, CAPILLARY
GLUCOSE-CAPILLARY: 101 mg/dL — AB (ref 65–99)
GLUCOSE-CAPILLARY: 89 mg/dL (ref 65–99)
GLUCOSE-CAPILLARY: 102 mg/dL — AB (ref 65–99)

## 2016-04-04 LAB — POCT ACTIVATED CLOTTING TIME
ACTIVATED CLOTTING TIME: 291 s
ACTIVATED CLOTTING TIME: 157 s
Activated Clotting Time: 235 seconds
Activated Clotting Time: 183 seconds

## 2016-04-04 SURGERY — LOWER EXTREMITY ANGIOGRAPHY

## 2016-04-04 MED ORDER — CLOPIDOGREL BISULFATE 75 MG PO TABS
75.0000 mg | ORAL_TABLET | Freq: Every day | ORAL | Status: DC
  Administered 2016-04-05: 09:00:00 75 mg via ORAL
  Filled 2016-04-04: qty 1

## 2016-04-04 MED ORDER — HEPARIN SODIUM (PORCINE) 1000 UNIT/ML IJ SOLN
INTRAMUSCULAR | Status: AC
  Filled 2016-04-04: qty 1

## 2016-04-04 MED ORDER — ATROPINE SULFATE 1 MG/10ML IJ SOSY
PREFILLED_SYRINGE | INTRAMUSCULAR | Status: AC
  Filled 2016-04-04: qty 10

## 2016-04-04 MED ORDER — SODIUM CHLORIDE 0.9 % IV SOLN
INTRAVENOUS | Status: AC
  Administered 2016-04-04: 16:00:00 via INTRAVENOUS

## 2016-04-04 MED ORDER — HEPARIN (PORCINE) IN NACL 2-0.9 UNIT/ML-% IJ SOLN
INTRAMUSCULAR | Status: AC
  Filled 2016-04-04: qty 2000

## 2016-04-04 MED ORDER — HEPARIN SODIUM (PORCINE) 1000 UNIT/ML IJ SOLN
INTRAMUSCULAR | Status: DC | PRN
  Administered 2016-04-04: 2500 [IU] via INTRAVENOUS
  Administered 2016-04-04: 7000 [IU] via INTRAVENOUS

## 2016-04-04 MED ORDER — LIDOCAINE HCL (PF) 1 % IJ SOLN
INTRAMUSCULAR | Status: AC
  Filled 2016-04-04: qty 60

## 2016-04-04 MED ORDER — SODIUM CHLORIDE 0.9% FLUSH: 3.0000 mL | INTRAVENOUS | Status: DC | PRN

## 2016-04-04 MED ORDER — HYDROCODONE-ACETAMINOPHEN 5-325 MG PO TABS
1.0000 | ORAL_TABLET | ORAL | Status: DC | PRN
  Administered 2016-04-04: 17:00:00 1 via ORAL
  Filled 2016-04-04: qty 1

## 2016-04-04 MED ORDER — METOPROLOL SUCCINATE ER 50 MG PO TB24
50.0000 mg | ORAL_TABLET | Freq: Every day | ORAL | Status: DC
  Filled 2016-04-04: qty 1

## 2016-04-04 MED ORDER — LIDOCAINE HCL (PF) 1 % IJ SOLN
INTRAMUSCULAR | Status: DC | PRN
  Administered 2016-04-04: 30 mL via SUBCUTANEOUS

## 2016-04-04 MED ORDER — NITROGLYCERIN 1 MG/10 ML FOR IR/CATH LAB
INTRA_ARTERIAL | Status: AC
  Filled 2016-04-04: qty 10

## 2016-04-04 MED ORDER — SODIUM CHLORIDE 0.9 % WEIGHT BASED INFUSION: 1.0000 mL/kg/h | INTRAVENOUS | Status: DC

## 2016-04-04 MED ORDER — MIDAZOLAM HCL 2 MG/2ML IJ SOLN
INTRAMUSCULAR | Status: DC | PRN
  Administered 2016-04-04: 1 mg via INTRAVENOUS

## 2016-04-04 MED ORDER — NITROGLYCERIN 1 MG/10 ML FOR IR/CATH LAB
INTRA_ARTERIAL | Status: DC | PRN
Start: 2016-04-04 — End: 2016-04-04
  Administered 2016-04-04: 200 ug via INTRA_ARTERIAL

## 2016-04-04 MED ORDER — PANTOPRAZOLE SODIUM 40 MG PO TBEC
80.0000 mg | DELAYED_RELEASE_TABLET | Freq: Every day | ORAL | Status: DC
  Administered 2016-04-05: 80 mg via ORAL
  Filled 2016-04-04: qty 2

## 2016-04-04 MED ORDER — MIDAZOLAM HCL 2 MG/2ML IJ SOLN
INTRAMUSCULAR | Status: AC
  Filled 2016-04-04: qty 2

## 2016-04-04 MED ORDER — HEPARIN (PORCINE) IN NACL 2-0.9 UNIT/ML-% IJ SOLN
INTRAMUSCULAR | Status: DC | PRN
  Administered 2016-04-04: 1000 mL

## 2016-04-04 MED ORDER — FENTANYL CITRATE (PF) 100 MCG/2ML IJ SOLN
INTRAMUSCULAR | Status: DC | PRN
  Administered 2016-04-04: 25 ug via INTRAVENOUS

## 2016-04-04 MED ORDER — MORPHINE SULFATE (PF) 2 MG/ML IV SOLN
2.0000 mg | INTRAVENOUS | Status: DC | PRN
  Administered 2016-04-04: 2 mg via INTRAVENOUS
  Filled 2016-04-04: qty 1

## 2016-04-04 MED ORDER — FENTANYL CITRATE (PF) 100 MCG/2ML IJ SOLN
INTRAMUSCULAR | Status: AC
  Filled 2016-04-04: qty 2

## 2016-04-04 MED ORDER — SODIUM CHLORIDE 0.9 % WEIGHT BASED INFUSION
3.0000 mL/kg/h | INTRAVENOUS | Status: DC
  Administered 2016-04-04: 3 mL/kg/h via INTRAVENOUS

## 2016-04-04 MED ORDER — HYDRALAZINE HCL 20 MG/ML IJ SOLN
10.0000 mg | INTRAMUSCULAR | Status: DC | PRN
Start: 2016-04-04 — End: 2016-04-05
  Filled 2016-04-04: qty 1

## 2016-04-04 MED ORDER — ASPIRIN EC 325 MG PO TBEC: 325.0000 mg | DELAYED_RELEASE_TABLET | Freq: Every day | ORAL | Status: DC

## 2016-04-04 MED ORDER — CLOPIDOGREL BISULFATE 75 MG PO TABS: 75.0000 mg | ORAL_TABLET | Freq: Every day | ORAL | Status: DC

## 2016-04-04 MED ORDER — ONDANSETRON HCL 4 MG/2ML IJ SOLN: 4.0000 mg | Freq: Four times a day (QID) | INTRAMUSCULAR | Status: DC | PRN

## 2016-04-04 MED ORDER — ASPIRIN EC 81 MG PO TBEC
81.0000 mg | DELAYED_RELEASE_TABLET | Freq: Every day | ORAL | Status: DC
  Administered 2016-04-05: 81 mg via ORAL
  Filled 2016-04-04: qty 1

## 2016-04-04 MED ORDER — ACETAMINOPHEN 325 MG PO TABS
650.0000 mg | ORAL_TABLET | ORAL | Status: DC | PRN
  Administered 2016-04-05: 09:00:00 650 mg via ORAL
  Filled 2016-04-04: qty 2

## 2016-04-04 MED ORDER — ASPIRIN 81 MG PO CHEW: 81.0000 mg | CHEWABLE_TABLET | ORAL | Status: DC

## 2016-04-04 MED ORDER — ANGIOPLASTY BOOK
Freq: Once | Status: AC
  Administered 2016-04-04: 22:00:00
  Filled 2016-04-04: qty 1

## 2016-04-04 MED ORDER — SODIUM CHLORIDE 0.9 % IV SOLN: 250.0000 mL | INTRAVENOUS | Status: DC | PRN

## 2016-04-04 MED ORDER — ATORVASTATIN CALCIUM 80 MG PO TABS
80.0000 mg | ORAL_TABLET | Freq: Every day | ORAL | Status: DC
  Filled 2016-04-04: qty 1

## 2016-04-04 MED ORDER — IODIXANOL 320 MG/ML IV SOLN
INTRAVENOUS | Status: DC | PRN
  Administered 2016-04-04: 200 mL

## 2016-04-04 MED ORDER — SODIUM CHLORIDE 0.9% FLUSH: 3.0000 mL | Freq: Two times a day (BID) | INTRAVENOUS | Status: DC

## 2016-04-04 SURGICAL SUPPLY — 30 items
BAG SNAP BAND KOVER 36X36 (MISCELLANEOUS) ×4 IMPLANT
BALLN ANGIOSCULPT OTW 5.0X20 (BALLOONS) ×4
BALLN ARMADA 18 6.0 X 200 (BALLOONS) ×4
BALLN COYOTE OTW 2.5X220X150 (BALLOONS) ×4
BALLN STERLING OTW 5X220X150 (BALLOONS) ×4
BALLOON ANGIOSCULPT OTW 5.0X20 (BALLOONS) ×2 IMPLANT
BALLOON ARMADA 18 6.0 X 200 (BALLOONS) ×2 IMPLANT
BALLOON COYOTE OTW 2.5X220X150 (BALLOONS) ×2 IMPLANT
BALLOON STERLING OTW 5X220X150 (BALLOONS) ×2 IMPLANT
CATH ANGIO 5F PIGTAIL 65CM (CATHETERS) ×4 IMPLANT
CATH CROSS OVER TEMPO 5F (CATHETERS) ×4 IMPLANT
CATH VIANCE CROSS STAND 150CM (MICROCATHETER) ×4
CATH VIANCE CROSS STD 150CM (MICROCATHETER) ×2 IMPLANT
DEVICE CONTINUOUS FLUSH (MISCELLANEOUS) ×4 IMPLANT
GUIDEWIRE ASTATO XS 20G 300CM (WIRE) ×4 IMPLANT
KIT ENCORE 26 ADVANTAGE (KITS) ×4 IMPLANT
KIT PV (KITS) ×4 IMPLANT
SHEATH HIGHFLEX ANSEL 6FRX55 (SHEATH) ×4 IMPLANT
SHEATH PINNACLE 5F 10CM (SHEATH) ×4 IMPLANT
SHEATH PINNACLE 6F 10CM (SHEATH) ×4 IMPLANT
STENT VIABAHN 6X150X120 (Permanent Stent) ×4 IMPLANT
STENT VIABAHN 6X250X120 (Permanent Stent) ×4 IMPLANT
STOPCOCK MORSE 400PSI 3WAY (MISCELLANEOUS) ×4 IMPLANT
SYRINGE MEDRAD AVANTA MACH 7 (SYRINGE) ×4 IMPLANT
TAPE RADIOPAQUE TURBO (MISCELLANEOUS) ×4 IMPLANT
TRANSDUCER W/STOPCOCK (MISCELLANEOUS) ×4 IMPLANT
TRAY PV CATH (CUSTOM PROCEDURE TRAY) ×4 IMPLANT
TUBING CIL FLEX 10 FLL-RA (TUBING) ×4 IMPLANT
WIRE HITORQ VERSACORE ST 145CM (WIRE) ×4 IMPLANT
WIRE SPARTACORE .014X300CM (WIRE) ×8 IMPLANT

## 2016-04-04 NOTE — H&P (View-Only) (Signed)
03/16/2016 Fred Hernandez   04/07/1956  [id number]  Primary [facility] Primary Cardiologist: Lorretta Harp MD Renae Gloss   HPI:  Fred Fred Hernandez is a 60 year old engaged African-American male with no children accompanied by his fiancs [name]. He was referred by Dr. Bronson Ing for evaluation of symptomatic PAD. He has a history of 75 years of tobacco abuse smoking 1-1/2 packs per day In addition, he has treated hypertension, hyperlipidemia and diabetes. He did have a stent placed in his coronary arteries Daniel 2016 after positive stress test. He's had recurrent claudication over the last 6 months in his right lower extremity. Dopplers performed 03/10/16 revealed a right ABI of 0.67 with occluded right SFA.   Current Outpatient Prescriptions  Medication Sig Dispense Refill  . aspirin 81 MG tablet Take 81 mg by mouth daily.    [name] atorvastatin (LIPITOR) 80 MG tablet Take 80 mg by mouth daily.    . clopidogrel (PLAVIX) 75 MG tablet Take 75 mg by mouth daily.    . fluticasone (FLONASE) 50 MCG/ACT nasal spray SHAKE LQ AND U 1 SPR IEN QD  3  . HYDROcodone-acetaminophen (NORCO/VICODIN) 5-325 MG tablet Take 1 tablet by mouth every 4 (four) hours as needed for moderate pain (Must last 30 days.  Do not take and drive a car or use machinery.). 120 tablet 0  . metFORMIN (GLUCOPHAGE) 500 MG tablet Take 500 mg by mouth 2 (two) times daily with a meal.    . metoprolol succinate (TOPROL-XL) 50 MG 24 hr tablet Take 1 tablet by mouth daily.    . Omega-3 Fatty Acids (FISH OIL PO) Take 2 capsules by mouth 2 (two) times daily.     [name] omeprazole (PRILOSEC) 20 MG capsule Take 40 mg by mouth daily.     No current facility-administered medications for this visit.    Allergies  Allergen Reactions  . Lisinopril Anaphylaxis  . Penicillins Anaphylaxis  . Ace Inhibitors Swelling    Social History   Social History  . Marital Status: Single    Spouse Name: N/A  .  Number of Children: N/A  . Years of Education: N/A   Occupational History  . Not on file.   Social History Main Topics  . Smoking status: Current Every Day Smoker -- 0.50 packs/day    Types: Cigarettes    Start date: 02/12/1971  . Smokeless tobacco: Never Used  . Alcohol Use: 0.0 oz/week    0 Standard drinks or equivalent per week     Comment: socially  . Drug Use: No  . Sexual Activity: Not on file   Other Topics Concern  . Not on file   Social History Narrative     Review of Systems: General: negative for chills, fever, night sweats or weight changes.  Cardiovascular: negative for chest pain, dyspnea on exertion, edema, orthopnea, palpitations, paroxysmal nocturnal dyspnea or shortness of breath Dermatological: negative for rash Respiratory: negative for cough or wheezing Urologic: negative for hematuria Abdominal: negative for nausea, vomiting, diarrhea, bright red blood per rectum, melena, or hematemesis Neurologic: negative for visual changes, syncope, or dizziness All other systems reviewed and are otherwise negative except as noted above.    Blood pressure 128/80, pulse 78, height '5\' 9"'$  (1.753 m), weight 170 lb 6 oz (77.282 kg).  General appearance: alert and no distress Neck: no adenopathy, no JVD, supple, symmetrical, trachea midline, thyroid not enlarged, symmetric, no tenderness/mass/nodules and oft right carotid bruit Lungs: clear to auscultation  bilaterally Heart: regular rate and rhythm, S1, S2 normal, no murmur, click, rub or gallop Extremities: 2+ left pedal pulse, absent right pedal pulse  EKG not performed today  ASSESSMENT AND PLAN:   Peripheral arterial disease ([place]) Fred. Hernandez presents now as a referral from Dr. Bronson Ing  for evaluation of symptomatic. PAD. He has a history of bilateral reduction restenting in [place] in 2013 which resulted in improvement in his claudication symptoms. Her Lasix months he's had recurrent symptoms. Dopplers  performed 03/10/16 the right ABI 0.67 with an occluded right SFA. We will arrange for him to undergo angiography and potential intervention for lifestyle limiting claudication.      Lorretta Harp MD FACP,FACC,FAHA, [facility] 03/16/2016 1:46 PM

## 2016-04-04 NOTE — Interval H&P Note (Signed)
History and Physical Interval Note:  04/04/2016 1:49 PM  Fred Hernandez  has presented today for surgery, with the diagnosis of claudicaiton  The various methods of treatment have been discussed with the patient and family. After consideration of risks, benefits and other options for treatment, the patient has consented to  Procedure(s): Lower Extremity Angiography (N/A) as a surgical intervention .  The patient's history has been reviewed, patient examined, no change in status, stable for surgery.  I have reviewed the patient's chart and labs.  Questions were answered to the patient's satisfaction.     Quay Burow

## 2016-04-05 ENCOUNTER — Other Ambulatory Visit: Payer: Self-pay | Admitting: Physician Assistant

## 2016-04-05 ENCOUNTER — Telehealth: Payer: Self-pay | Admitting: Physician Assistant

## 2016-04-05 ENCOUNTER — Encounter (HOSPITAL_COMMUNITY): Payer: Self-pay

## 2016-04-05 DIAGNOSIS — E1151 Type 2 diabetes mellitus with diabetic peripheral angiopathy without gangrene: Secondary | ICD-10-CM | POA: Diagnosis not present

## 2016-04-05 DIAGNOSIS — I739 Peripheral vascular disease, unspecified: Secondary | ICD-10-CM

## 2016-04-05 DIAGNOSIS — Z720 Tobacco use: Secondary | ICD-10-CM

## 2016-04-05 DIAGNOSIS — E785 Hyperlipidemia, unspecified: Secondary | ICD-10-CM

## 2016-04-05 DIAGNOSIS — I10 Essential (primary) hypertension: Secondary | ICD-10-CM

## 2016-04-05 DIAGNOSIS — E1159 Type 2 diabetes mellitus with other circulatory complications: Secondary | ICD-10-CM

## 2016-04-05 LAB — BASIC METABOLIC PANEL
ANION GAP: 10 (ref 5–15)
BUN: 11 mg/dL (ref 6–20)
CHLORIDE: 106 mmol/L (ref 101–111)
CO2: 22 mmol/L (ref 22–32)
CREATININE: 0.85 mg/dL (ref 0.61–1.24)
Calcium: 9 mg/dL (ref 8.9–10.3)
GFR calc non Af Amer: >60 mL/min (ref >60)
GLUCOSE: 109 mg/dL — AB (ref 65–99)
Potassium: 4.2 mmol/L (ref 3.5–5.1)
Sodium: 138 mmol/L (ref 135–145)

## 2016-04-05 LAB — GLUCOSE, CAPILLARY
GLUCOSE-CAPILLARY: 151 mg/dL — AB (ref 65–99)
GLUCOSE-CAPILLARY: 128 mg/dL — AB (ref 65–99)

## 2016-04-05 LAB — CBC
HCT: 43 % (ref 39.0–52.0)
HEMOGLOBIN: 13.8 g/dL (ref 13.0–17.0)
MCH: 27.9 pg (ref 26.0–34.0)
MCHC: 32.1 g/dL (ref 30.0–36.0)
MCV: 87 fL (ref 78.0–100.0)
PLATELETS: 303 10*3/uL (ref 150–400)
RBC: 4.94 MIL/uL (ref 4.22–5.81)
RDW: 13.7 % (ref 11.5–15.5)
WBC: 9.3 10*3/uL (ref 4.0–10.5)

## 2016-04-05 MED ORDER — PANTOPRAZOLE SODIUM 40 MG PO TBEC: 80.0000 mg | DELAYED_RELEASE_TABLET | Freq: Every day | ORAL | Status: DC

## 2016-04-05 MED ORDER — NICOTINE 21 MG/24HR TD PT24
21.0000 mg | MEDICATED_PATCH | Freq: Every day | TRANSDERMAL | Status: DC
  Administered 2016-04-05: 05:00:00 21 mg via TRANSDERMAL
  Filled 2016-04-05 (×2): qty 1

## 2016-04-05 NOTE — Progress Notes (Signed)
Subjective:  No chest pain or sob; walked this am without claudication  Objective:   Vital Signs : Filed Vitals:   04/05/16 0000 04/05/16 0100 04/05/16 0429 04/05/16 0735  BP: 127/70 124/61 124/62 136/78  Pulse:   73 76  Temp:   98.1 F (36.7 C) 97 F (36.1 C)  TempSrc:   Oral Oral  Resp: '20 19 18 21  '$ Height:      Weight:   171 lb 15.3 oz (78 kg)   SpO2:   94% 95%    Intake/Output from previous day:  Intake/Output Summary (Last 24 hours) at 04/05/16 1006 Last data filed at 04/05/16 0735  Gross per 24 hour  Intake   1065 ml  Output    700 ml  Net    365 ml    I/O since admission: +365  Wt Readings from Last 3 Encounters:  04/05/16 171 lb 15.3 oz (78 kg)  03/16/16 170 lb 6 oz (77.282 kg)  02/24/16 178 lb (80.74 kg)    Medications: . aspirin EC  81 mg Oral Daily  . atorvastatin  80 mg Oral Daily  . clopidogrel  75 mg Oral Daily  . metoprolol succinate  50 mg Oral Daily  . nicotine  21 mg Transdermal Daily  . pantoprazole  80 mg Oral Daily  . sodium chloride flush  3 mL Intravenous Q12H       Physical Exam:   General appearance: alert, cooperative and no distress Neck: no adenopathy, no JVD, supple, symmetrical, trachea midline and thyroid not enlarged, symmetric, no tenderness/mass/nodules Lungs: clear to auscultation bilaterally Heart: regular rate and rhythm; 1/6 systolic murmur Abdomen: soft, non-tender; bowel sounds normal; no masses,  no organomegaly Extremities: no edema, redness or tenderness in the calves or thighs Pulses: 2+ and symmetric: left groin site tender but without ecchymosis or hematoma Skin: Skin color, texture, turgor normal. No rashes or lesions Neurologic: Grossly normal   Rate: 75  Rhythm: normal sinus rhythm   Lab Results:   Recent Labs  04/05/16 0420  NA 138  K 4.2  CL 106  CO2 22  GLUCOSE 109*  BUN 11  CREATININE 0.85  CALCIUM 9.0    No flowsheet data found.   Recent Labs  04/05/16 0420  WBC 9.3  HGB  13.8  HCT 43.0  MCV 87.0  PLT 303    No results for input(s): TROPONINI in the last 72 hours.  Invalid input(s): CK, MB  Lab Results  Component Value Date   TSH 3.60 03/29/2016   No results for input(s): HGBA1C in the last 72 hours.  No results for input(s): PROT, ALBUMIN, AST, ALT, ALKPHOS, BILITOT, BILIDIR, IBILI in the last 72 hours. No results for input(s): INR in the last 72 hours. BNP (last 3 results) No results for input(s): BNP in the last 8760 hours.  ProBNP (last 3 results) No results for input(s): PROBNP in the last 8760 hours.   Lipid Panel  No results found for: CHOL, TRIG, HDL, CHOLHDL, VLDL, LDLCALC, LDLDIRECT    Imaging:  No results found.    PV PROCEDURE: Angiographic Data:   1: Abdominal aortogram-renal artery is widely patent. The infrarenal aorta was free of atherosclerotic changes 2: Left lower extremity-there was a patent nitinol self expanding stent in the mid and distal left SFA With 3 vessel runoff 3:Right lower extremity-theright SFA stent was occluded from its origin down to the adductor canal. There was two-vessel runoff with occluded anterior tibial  Final Impression:  successful PTA and covered stenting using overlapping Viabahn covered stents of a long segment "in-stent restenosis of previously placed nitinol self expanding stents in the proximal mid and distal right SFA. The patient tolerated the procedure well. He is already on dual antibiotic therapy. Sheath will be removed and pressure held once the AST falls below 170. He'll be hydrated overnight, discharged home in the morning.[name] He'll get follow-up lower extremity arterial Doppler studies in our [place] l office next week and I will see him back 2-3 weeks thereafter. Assessment/Plan:   Active Problems:   Peripheral arterial disease ([facility])   Claudication ([facility])   Day 1 s/p successful PTA and covered stenting using overlapping Viabahncovered stents of a long segment in-stent  restenosis of previously placed nitinol self expanding stents in the proximal mid and distal right SFA. Will dc today.   Troy Sine, MD, [facility] 04/05/2016, 10:06 AM

## 2016-04-05 NOTE — Care Management Note (Signed)
Case Management Note  Patient Details  Name: JAEDON SILER MRN: [id number] Date of Birth: [birth year]  Subjective/Objective:     s/p PTA with stent         Action/Plan: Discharge Planning: AVS reviewed: Chart reviewed. No NCM needs identified.    Expected Discharge Date:  04/05/16               Expected Discharge Plan:  Home/Self Care  In-House Referral:  NA  Discharge planning Services  CM Consult  Post Acute Care Choice:  NA Choice offered to:  NA  DME Arranged:  N/A DME Agency:  NA  HH Arranged:  NA HH Agency:  NA  Status of Service:  Completed, signed off  Medicare Important Message Given:    Date Medicare IM Given:    Medicare IM give by:    Date Additional Medicare IM Given:    Additional Medicare Important Message give by:     If discussed at [place] of Stay Meetings, dates discussed:    Additional Comments:  Erenest Rasher, RN 04/05/2016, 12:25 PM

## 2016-04-05 NOTE — Progress Notes (Signed)
Site area: left groin  Site Prior to Removal:  Level 0  Pressure Applied For 20 MINUTES    Minutes Beginning at 2045  Manual:   Yes.    Patient Status During Pull:  Hemodynamically stable; no s/s vagal reaction. No complaints   Post Pull Groin Site:  Level 0  Post Pull Instructions Given:  Yes.    Post Pull Pulses Present:  Yes.    Dressing Applied:  Yes.    Comments:  the patient tolerated well; has no complaints. Verbalizes understanding of bedrest, arterial bleeding, precautions, and what to do if bleeding occurs. Site is soft, hemostasis achieved.

## 2016-04-05 NOTE — Telephone Encounter (Signed)
Error

## 2016-04-05 NOTE — Discharge Summary (Signed)
Discharge Summary    Patient ID: Fred Hernandez,  MRN: [id number], DOB/AGE: October 07, 1956 60 y.o.  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 PV angio - see below. _____________   History of Present Illness & Hospital Course    Fred Hernandez is a 60 y/o M with history of HTN, HLD, DM, tobacco abuse, CAD s/p stent placement unknown artery 12/2014, PAD (s/p prior leg stent placement) who presented for planned PV angiogram. He was recently referred to Fred Hernandez for evaluation of claudication and abnormal LE duplex. On 04/04/16 he underwent successful PTA and covered stenting using overlapping Viabahn covered stents of a long segment in-stent restenosis of previously placed nitinol self expanding stents in the proximal mid and distal right SFA. He will be maintained on DAPT. He will get f/u LE art duplex studies next week and f/u 2-3 weeks thereafter. He did well post-procedure. Fred Hernandez has seen and examined the patient today and feels he is stable for discharge. He was told he can restart his Metformin tomorrow evening (>48 hr out from cath) and was instructed to change Prilosec to equivalent Protonix given Plavix use.  Consultants: N/A _____________  Discharge Vitals Blood pressure 121/77, pulse 80, temperature 97.7 F (36.5 C), temperature source Oral, resp. rate 21, height '5\' 9"'$  (1.753 m), weight 171 lb 15.3 oz (78 kg), SpO2 98 %.  Filed Weights   04/04/16 0940 04/05/16 0429  Weight: 171 lb (77.565 kg) 171 lb 15.3 oz (78 kg)    Labs & Radiologic Studies    CBC  Recent Labs  04/05/16 0420  WBC 9.3  HGB 13.8    HCT 43.0  MCV 87.0  PLT 768   Basic Metabolic Panel  Recent Labs  04/05/16 0420  NA 138  K 4.2  CL 106  CO2 22  GLUCOSE 109*  BUN 11  CREATININE 0.85  CALCIUM 9.0   _____________  Dg Chest 2 View  03/29/2016  CLINICAL DATA:  Preoperative examination prior to heart catheterization; occasional cough and shortness of breath, current smoker. History of coronary stent placement, diabetes, and peripheral vascular disease. EXAM: CHEST  2 VIEW COMPARISON:  None in PACs FINDINGS: The left lung is well-expanded and clear. On the right there are is increased density in the perihilar region. There is linear density in the right upper lobe. No air bronchograms are observed. There is no pleural effusion or pneumothorax. The bony thorax is unremarkable. IMPRESSION: Increased interstitial density in the right perihilar region consistent with atelectasis or pneumonia or less likely scarring. There is likely scarring in the right apex. Without previous studies it is not possible to confirm whether or not the findings are chronic. Electronically Signed   By: Fred  Hernandez M.D.   On: 03/29/2016 14:41   Disposition   Pt is being discharged home today in good condition.  Follow-up Plans & Appointments    Follow-up Information    Follow up with Fred Hernandez.   Specialty:  Cardiology   Why:  04/13/16 at 2:30pm for your follow-up leg ultrasound   Contact information:   [place]  [place] [phone]      Follow up with Fred Burow, MD.   Specialties:  Cardiology, Radiology   Why:  04/21/16 at [facility] information:   [address] [place] [address] [phone]      Discharge Instructions    Diet - low sodium heart healthy    Complete by:  As directed      Increase activity slowly    Complete by:  As directed   No driving for 2 days. No lifting over 5 lbs for 1 week. No sexual activity for 1 week. Keep  procedure site clean & dry. If you notice increased pain, swelling, bleeding or pus, call/return!  You may shower, but no soaking baths/hot tubs/pools for 1 week.   You may restart your Metformin tomorrow evening (04/06/16).  Some studies suggest Prilosec/Omeprazole interacts with Plavix. We changed your Prilosec/Omeprazole to equivalent dose of Protonix for less chance of interaction.           Discharge Medications   Current Discharge Medication List    START taking these medications   Details  pantoprazole (PROTONIX) 40 MG tablet Take 2 tablets (80 mg total) by mouth daily. Qty: 60 tablet, Refills: 1      CONTINUE these medications which have NOT CHANGED   Details  aspirin 81 MG tablet Take 81 mg by mouth daily.    atorvastatin (LIPITOR) 80 MG tablet Take 80 mg by mouth daily.    clopidogrel (PLAVIX) 75 MG tablet Take 75 mg by mouth daily.    fluticasone (FLONASE) 50 MCG/ACT nasal spray Place 1 spray into both nostrils daily.    HYDROcodone-acetaminophen (NORCO/VICODIN) 5-325 MG tablet Take 1 tablet by mouth every 4 (four) hours as needed for moderate pain (Must last 30 days.  Do not take and drive a car or use machinery.).     metFORMIN (GLUCOPHAGE) 500 MG tablet Take 500 mg by mouth 2 (two) times daily with a meal.     metoprolol succinate (TOPROL-XL) 50 MG 24 hr tablet Take 1 tablet by mouth daily.    Omega-3 Fatty Acids (FISH OIL PO) Take 2 capsules by mouth 2 (two) times daily.       STOP taking these medications     omeprazole (PRILOSEC) 20 MG capsule          Allergies:  Allergies  Allergen Reactions  . Lisinopril Anaphylaxis  . Penicillins Anaphylaxis  . Ace Inhibitors Swelling    Outstanding Labs/Studies   NA  Duration of Discharge Encounter   Greater than 30 minutes including physician time.  Signed, Charlie Pitter PA-C 04/05/2016, 11:47 AM

## 2016-04-06 ENCOUNTER — Other Ambulatory Visit: Payer: Self-pay | Admitting: Cardiovascular Disease

## 2016-04-06 ENCOUNTER — Encounter: Payer: Self-pay | Admitting: Orthopaedic Surgery

## 2016-04-06 ENCOUNTER — Ambulatory Visit (INDEPENDENT_AMBULATORY_CARE_PROVIDER_SITE_OTHER): Payer: Medicare Other | Admitting: Orthopaedic Surgery

## 2016-04-06 VITALS — BP 122/73 | HR 90 | Temp 97.9°F | Ht 67.0 in | Wt 169.4 lb

## 2016-04-06 DIAGNOSIS — M25512 Pain in left shoulder: Secondary | ICD-10-CM | POA: Diagnosis not present

## 2016-04-06 DIAGNOSIS — I10 Essential (primary) hypertension: Secondary | ICD-10-CM

## 2016-04-06 DIAGNOSIS — I739 Peripheral vascular disease, unspecified: Secondary | ICD-10-CM

## 2016-04-06 MED ORDER — HYDROCODONE-ACETAMINOPHEN 7.5-325 MG PO TABS: 1.0000 | ORAL_TABLET | ORAL | Status: DC | PRN

## 2016-04-06 NOTE — Progress Notes (Signed)
Patient Fred Hernandez:Fred Hernandez, male DOB:Mar 24, 1956, 60 y.o. HWE:[id number]  Chief Complaint  Patient presents with  . Follow-up    left shoulder pain    HPI  GER RINGENBERG is a 60 y.o. male who has chronic pain of the left shoulder.  He has no new trauma but he did have a surgical procedure for placement of a stent into the right leg two days ago.  They kept the left arm out on an arm board which made his left shoulder pain worse.  He has no paresthesias, no redness or other trauma.  He is doing his exercises.    HPI  Body mass index is 26.53 kg/(m^2).  ROS  Review of Systems  Constitutional:       Patient has Diabetes Mellitus. Patient has hypertension. Patient has COPD or shortness of breath. Patient does not have BMI > 35. Patient has current smoking history  HENT: Negative for congestion.   Cardiovascular: Negative for chest pain.  Endocrine: Positive for cold intolerance.  Musculoskeletal: Positive for myalgias and arthralgias.  Allergic/Immunologic: Positive for environmental allergies.  Neurological: Negative for numbness.    Past Medical History  Diagnosis Date  . Hypertension   . Diabetes mellitus ([place])   . GERD (gastroesophageal reflux disease)   . History of prostate cancer   . CAD (coronary artery disease)      s/p stent placement unknown artery 12/2014,  . Hyperlipemia   . Tobacco abuse   . Peripheral arterial disease ([place])     a. h/o stents. b. 03/2016  - successful PTA and covered stenting using overlapping Viabahn covered stents of a long segment in-stent restenosis of previously placed nitinol self expanding stents in the proximal mid and distal right SFA.    Past Surgical History  Procedure Laterality Date  . Stent placement vascular ([facility])  2013  . Coronary stent placement  12-2014    [facility]  . Peripheral vascular catheterization N/A 04/04/2016    Procedure: Lower Extremity Angiography;  Surgeon: Lorretta Harp, MD;  Location:  [facility];  Service: Cardiovascular;  Laterality: N/A;  . Peripheral vascular catheterization N/A 04/04/2016    Procedure: Abdominal Aortogram;  Surgeon: Lorretta Harp, MD;  Location: [place] [facility];  Service: Cardiovascular;  Laterality: N/A;  . Peripheral vascular catheterization  04/04/2016    Procedure: Peripheral Vascular Intervention;  Surgeon: Lorretta Harp, MD;  Location: [place] [facility];  Service: Cardiovascular;;  rt SFA stent    Family History  Problem Relation Age of Onset  . Diabetes Mother   . Heart disease Mother   . Hypertension Mother   . Arthritis Brother   . Heart disease Brother   . Arthritis Sister   . Heart disease Sister     Social History Social History  Substance Use Topics  . Smoking status: Current Every Day Smoker -- 1.50 packs/day    Types: Cigarettes    Start date: 02/12/1971  . Smokeless tobacco: Never Used  . Alcohol Use: 0.0 oz/week    0 Standard drinks or equivalent per week     Comment: socially    Allergies  Allergen Reactions  . Lisinopril Anaphylaxis  . Penicillins Anaphylaxis  . Ace Inhibitors Swelling    Current Outpatient Prescriptions  Medication Sig Dispense Refill  . aspirin 81 MG tablet Take 81 mg by mouth daily.    [name] atorvastatin (LIPITOR) 80 MG tablet Take 80 mg by mouth daily.    [name]  clopidogrel (PLAVIX) 75 MG tablet Take 75 mg by mouth daily.    . fluticasone (FLONASE) 50 MCG/ACT nasal spray Place 1 spray into both nostrils daily.    [name] HYDROcodone-acetaminophen (NORCO) 7.5-325 MG tablet Take 1 tablet by mouth every 4 (four) hours as needed for moderate pain (Must last 30 days.  Do not drive or operate machinery while taking this medicine.). 120 tablet 0  . metFORMIN (GLUCOPHAGE) 500 MG tablet Take 500 mg by mouth 2 (two) times daily with a meal.    . metoprolol succinate (TOPROL-XL) 50 MG 24 hr tablet Take 1 tablet by mouth daily.    . Omega-3 Fatty Acids (FISH OIL PO) Take 2 capsules by mouth 2 (two) times  daily.     . pantoprazole (PROTONIX) 40 MG tablet Take 2 tablets (80 mg total) by mouth daily. 60 tablet 1   No current facility-administered medications for this visit.     Physical Exam  Blood pressure 122/73, pulse 90, temperature 97.9 F (36.6 C), height '5\' 7"'$  (1.702 m), weight 169 lb 6.4 oz (76.839 kg).  Constitutional: overall normal hygiene, normal nutrition, well developed, normal grooming, normal body habitus. Assistive device:none  Musculoskeletal: gait and station Limp none, muscle tone and strength are normal, no tremors or atrophy is present.  .  Neurological: coordination overall normal.  Deep tendon reflex/nerve stretch intact.  Sensation normal.  Cranial nerves II-XII intact.   Skin:   normal overall no scars, lesions, ulcers or rashes. No psoriasis.  Psychiatric: Alert and oriented x 3.  Recent memory intact, remote memory unclear.  Normal mood and affect. Well groomed.  Good eye contact.  Cardiovascular: overall no swelling, no varicosities, no edema bilaterally, normal temperatures of the legs and arms, no clubbing, cyanosis and good capillary refill.  Lymphatic: palpation is normal.  Examination of left Upper Extremity is done.  Inspection:   Overall:  Elbow non-tender without crepitus or defects, forearm non-tender without crepitus or defects, wrist non-tender without crepitus or defects, hand non-tender.    Shoulder: with glenohumeral joint tenderness, without effusion.   Upper arm: without swelling and tenderness   Range of motion:   Overall:  Full range of motion of the elbow, full range of motion of wrist and full range of motion in fingers.   Shoulder:  left  145 degrees forward flexion; 120 degrees abduction; 30 degrees internal rotation, 30 degrees external rotation, 10 degrees extension, 35 degrees adduction.   Stability:   Overall:  Shoulder, elbow and wrist stable   Strength and Tone:   Overall full shoulder muscles strength, full upper arm  strength and normal upper arm bulk and tone.    The patient has been educated about the nature of the problem(s) and counseled on treatment options.  The patient appeared to understand what I have discussed and is in agreement with it.  Encounter Diagnoses  Name Primary?  . Left shoulder pain Yes  . Essential hypertension   . Peripheral arterial disease ([place])     PLAN Call if any problems.  Precautions discussed.  Continue current medications.   Return to clinic 6 weeks   Continue exercises.  Electronically [place], MD 6/7/20172:43 PM

## 2016-04-08 ENCOUNTER — Encounter (HOSPITAL_COMMUNITY): Payer: Self-pay | Admitting: Cardiovascular Disease

## 2016-04-13 ENCOUNTER — Ambulatory Visit (HOSPITAL_COMMUNITY)
Admission: RE | Admit: 2016-04-13 | Discharge: 2016-04-13 | Disposition: A | Discharge status: Home / Self Care | Payer: Medicare Other | Source: Ambulatory Visit | Attending: Physician Assistant | Admitting: Physician Assistant

## 2016-04-13 DIAGNOSIS — E785 Hyperlipidemia, unspecified: Secondary | ICD-10-CM | POA: Insufficient documentation

## 2016-04-13 DIAGNOSIS — I10 Essential (primary) hypertension: Secondary | ICD-10-CM | POA: Diagnosis not present

## 2016-04-13 DIAGNOSIS — K219 Gastro-esophageal reflux disease without esophagitis: Secondary | ICD-10-CM | POA: Insufficient documentation

## 2016-04-13 DIAGNOSIS — I739 Peripheral vascular disease, unspecified: Secondary | ICD-10-CM | POA: Insufficient documentation

## 2016-04-13 DIAGNOSIS — Z720 Tobacco use: Secondary | ICD-10-CM | POA: Insufficient documentation

## 2016-04-13 DIAGNOSIS — E119 Type 2 diabetes mellitus without complications: Secondary | ICD-10-CM | POA: Diagnosis not present

## 2016-04-13 DIAGNOSIS — I70201 Unspecified atherosclerosis of native arteries of extremities, right leg: Secondary | ICD-10-CM | POA: Insufficient documentation

## 2016-04-13 DIAGNOSIS — I2510 Atherosclerotic heart disease of native coronary artery without angina pectoris: Secondary | ICD-10-CM | POA: Insufficient documentation

## 2016-04-21 ENCOUNTER — Ambulatory Visit (INDEPENDENT_AMBULATORY_CARE_PROVIDER_SITE_OTHER): Payer: Medicare Other | Admitting: Cardiovascular Disease

## 2016-04-21 ENCOUNTER — Encounter: Payer: Self-pay | Admitting: Cardiovascular Disease

## 2016-04-21 VITALS — BP 102/64 | HR 70 | Ht 69.0 in | Wt 172.0 lb

## 2016-04-21 DIAGNOSIS — I739 Peripheral vascular disease, unspecified: Secondary | ICD-10-CM | POA: Diagnosis not present

## 2016-04-21 NOTE — Patient Instructions (Signed)
Medication Instructions:  Your physician recommends that you continue on your current medications as directed. Please refer to the Current Medication list given to you today.   Labwork: NONE  Testing/Procedures: Your physician has requested that you have a lower extremity arterial doppler- During this test, ultrasound is used to evaluate arterial blood flow in the legs. Allow approximately one hour for this exam.  IN 6 MONTHS.   Follow-Up: Your physician wants you to follow-up in: [place]. You will receive a reminder letter in the mail two months in advance. If you don't receive a letter, please call our office to schedule the follow-up appointment.   Any Other Special Instructions Will Be Listed Below (If Applicable).     If you need a refill on your cardiac medications before your next appointment, please call your pharmacy.

## 2016-04-21 NOTE — Assessment & Plan Note (Signed)
Fred Hernandez was referred to me by Dr. Bronson Ing  for evaluation of symptomatically. His initial Dopplers performed 03/10/16 revealed a right ABI 0.67 with an occluded right SFA. I intubated him and him 04/04/16 confirming a total right SFA down to the adductor canal. I was able to revascularize him with overlapping via Bahn covered stents.his follow-up Dopplers performed 04/13/16 revealed a widely patent SFA with a right ABI that it had increased from 0.67 up to 0.95. He no longer has claudication. He is on dual antiplatelet therapy.

## 2016-04-21 NOTE — Progress Notes (Signed)
04/21/2016 Fred Hernandez   [birth year]  [id number]  Primary Physician Antionette Fairy, PA-C Primary Cardiologist: Lorretta Harp MD Renae Gloss  HPI:  Fred Hernandez is a 60 year old engaged African-American male with no children accompanied by his fiancs Fred Hernandez. He was referred by Dr. Bronson Ing for evaluation of symptomatic PAD. I last saw him in the office 03/16/16.He has a history of 75 years of tobacco abuse smoking 1-1/2 packs per day In addition, he has treated hypertension, hyperlipidemia and diabetes. He did have a stent placed in his coronary arteries Fred Hernandez 2016 after positive stress test. He's had recurrent claudication over the last 6 months in his right lower extremity. Dopplers performed 03/10/16 revealed a right ABI of 0.67 with occluded right SFA. He presents today for angiography and potential intervention. I performed angiography on him 04/04/16 confirming occluded SFA from the origin down to the adductor canal with two-vessel runoff is able to revascularize him percutaneously with overlapping bye-bye and covered stents excellent clinical angiographic and ultrasonographic result. He no longer has claudication. He does continue to smoke however.   Current Outpatient Prescriptions  Medication Sig Dispense Refill  . aspirin 81 MG tablet Take 81 mg by mouth daily.    [name] atorvastatin (LIPITOR) 80 MG tablet Take 80 mg by mouth daily.    . clopidogrel (PLAVIX) 75 MG tablet Take 75 mg by mouth daily.    . fluticasone (FLONASE) 50 MCG/ACT nasal spray Place 1 spray into both nostrils daily.    [name] HYDROcodone-acetaminophen (NORCO) 7.5-325 MG tablet Take 1 tablet by mouth every 4 (four) hours as needed for moderate pain (Must last 30 days.  Do not drive or operate machinery while taking this medicine.). 120 tablet 0  . metFORMIN (GLUCOPHAGE) 500 MG tablet Take 500 mg by mouth 2 (two) times daily with a meal.    . metoprolol succinate (TOPROL-XL) 50 MG 24 hr tablet Take 1 tablet by  mouth daily.    . Omega-3 Fatty Acids (FISH OIL PO) Take 2 capsules by mouth 2 (two) times daily.     . pantoprazole (PROTONIX) 40 MG tablet Take 2 tablets (80 mg total) by mouth daily. 60 tablet 1   No current facility-administered medications for this visit.    Allergies  Allergen Reactions  . Lisinopril Anaphylaxis  . Penicillins Anaphylaxis  . Ace Inhibitors Swelling    Social History   Social History  . Marital Status: Single    Spouse Name: N/A  . Number of Children: N/A  . Years of Education: N/A   Occupational History  . Not on file.   Social History Main Topics  . Smoking status: Current Every Day Smoker -- 1.50 packs/day    Types: Cigarettes    Start date: 02/12/1971  . Smokeless tobacco: Never Used  . Alcohol Use: 0.0 oz/week    0 Standard drinks or equivalent per week     Comment: socially  . Drug Use: No  . Sexual Activity: Not on file   Other Topics Concern  . Not on file   Social History Narrative     Review of Systems: General: negative for chills, fever, night sweats or weight changes.  Cardiovascular: negative for chest pain, dyspnea on exertion, edema, orthopnea, palpitations, paroxysmal nocturnal dyspnea or shortness of breath Dermatological: negative for rash Respiratory: negative for cough or wheezing Urologic: negative for hematuria Abdominal: negative for nausea, vomiting, diarrhea, bright red blood per rectum, melena, or hematemesis Neurologic: negative for visual  changes, syncope, or dizziness All other systems reviewed and are otherwise negative except as noted above.    Blood pressure 102/64, pulse 70, height '5\' 9"'$  (1.753 m), weight 172 lb (78.019 kg).  General appearance: alert and no distress Neck: no adenopathy, no carotid bruit, no JVD, supple, symmetrical, trachea midline and thyroid not enlarged, symmetric, no tenderness/mass/nodules Lungs: clear to auscultation bilaterally Heart: regular rate and rhythm, S1, S2 normal, no  murmur, click, rub or gallop Extremities: extremities normal, atraumatic, no cyanosis or edema and his left femoral arterial puncture site is well-healed. He has a palpable right posterior tibial pulse.  EKG not performed today  ASSESSMENT AND PLAN:   Peripheral arterial disease [facility]) Fred. Soy was referred to me by Dr. Bronson Ing  for evaluation of symptomatically. His initial Dopplers performed 03/10/16 revealed a right ABI 0.67 with an occluded right SFA. I intubated him and him 04/04/16 confirming a total right SFA down to the adductor canal. I was able to revascularize him with overlapping via Bahn covered stents.his follow-up Dopplers performed 04/13/16 revealed a widely patent SFA with a right ABI that it had increased from 0.67 up to 0.95. He no longer has claudication. He is on dual antiplatelet therapy.      Lorretta Harp MD FACP,FACC,FAHA, [facility] 04/21/2016 11:53 AM

## 2016-05-09 ENCOUNTER — Telehealth: Payer: Self-pay | Admitting: Orthopaedic Surgery

## 2016-05-09 MED ORDER — HYDROCODONE-ACETAMINOPHEN 7.5-325 MG PO TABS: 1.0000 | ORAL_TABLET | ORAL | Status: DC | PRN

## 2016-05-09 NOTE — Telephone Encounter (Signed)
Hydrocodone-Acetaminophen 7.5/325mg Qty 120 Tablets °

## 2016-05-09 NOTE — Telephone Encounter (Signed)
Rx done. 

## 2016-05-18 ENCOUNTER — Ambulatory Visit (INDEPENDENT_AMBULATORY_CARE_PROVIDER_SITE_OTHER): Payer: Medicare Other | Admitting: Orthopaedic Surgery

## 2016-05-18 VITALS — BP 126/77 | HR 83 | Temp 97.5°F | Ht 68.0 in | Wt 172.8 lb

## 2016-05-18 DIAGNOSIS — M25512 Pain in left shoulder: Secondary | ICD-10-CM | POA: Diagnosis not present

## 2016-05-18 NOTE — Progress Notes (Signed)
Patient PI:Fred Hernandez, male DOB:03/15/1956, 60 y.o. Fred Hernandez  Chief Complaint  Patient presents with  . Follow-up    Left shoulder    HPI  Fred Hernandez is a 60 y.o. male who has left shoulder pain.  He is somewhat better.  He has no paresthesias, no redness, no trauma.  He is doing his exercises and taking his medicine.  HPI  Body mass index is 26.28 kg/(m^2).  ROS  Review of Systems  Constitutional:       Patient has Diabetes Mellitus. Patient has hypertension. Patient has COPD or shortness of breath. Patient does not have BMI > 35. Patient has current smoking history  HENT: Negative for congestion.   Cardiovascular: Negative for chest pain.  Endocrine: Positive for cold intolerance.  Musculoskeletal: Positive for myalgias and arthralgias.  Allergic/Immunologic: Positive for environmental allergies.  Neurological: Negative for numbness.    Past Medical History  Diagnosis Date  . Hypertension   . Diabetes mellitus ([place])   . GERD (gastroesophageal reflux disease)   . History of prostate cancer   . CAD (coronary artery disease)      s/p stent placement unknown artery 12/2014,  . Hyperlipemia   . Tobacco abuse   . Peripheral arterial disease ([place])     a. h/o stents. b. 03/2016  - successful PTA and covered stenting using overlapping Viabahn covered stents of a long segment in-stent restenosis of previously placed nitinol self expanding stents in the proximal mid and distal right SFA.    Past Surgical History  Procedure Laterality Date  . Stent placement vascular ([facility] h[facility])  2013  . Coronary stent placement  12-2014    [facility]  . Peripheral vascular catheterization N/A 04/04/2016    Procedure: Lower Extremity Angiography;  Surgeon: Lorretta Harp, MD;  Location: [place] [facility];  Service: Cardiovascular;  Laterality: N/A;  . Peripheral vascular catheterization N/A 04/04/2016    Procedure: Abdominal Aortogram;  Surgeon: Lorretta Harp,  MD;  Location: [place] [facility];  Service: Cardiovascular;  Laterality: N/A;  . Peripheral vascular catheterization  04/04/2016    Procedure: Peripheral Vascular Intervention;  Surgeon: Lorretta Harp, MD;  Location: [place] [facility];  Service: Cardiovascular;;  rt SFA stent    Family History  Problem Relation Age of Onset  . Diabetes Mother   . Heart disease Mother   . Hypertension Mother   . Healthy Brother   . Other Brother     accident   . Arthritis Sister   . Arthritis Sister   . SIDS Brother     Social History Social History  Substance Use Topics  . Smoking status: Current Every Day Smoker -- 1.50 packs/day    Types: Cigarettes    Start date: 02/12/1971  . Smokeless tobacco: Never Used  . Alcohol Use: 0.0 oz/week    0 Standard drinks or equivalent per week     Comment: socially    Allergies  Allergen Reactions  . Lisinopril Anaphylaxis  . Penicillins Anaphylaxis  . Ace Inhibitors Swelling    Current Outpatient Prescriptions  Medication Sig Dispense Refill  . aspirin 81 MG tablet Take 81 mg by mouth daily.    [name] atorvastatin (LIPITOR) 80 MG tablet Take 80 mg by mouth daily.    . clopidogrel (PLAVIX) 75 MG tablet Take 75 mg by mouth daily.    . fluticasone (FLONASE) 50 MCG/ACT nasal spray Place 1 spray into both nostrils daily.    [name]  HYDROcodone-acetaminophen (NORCO) 7.5-325 MG tablet Take 1 tablet by mouth every 4 (four) hours as needed for moderate pain (Must last 30 days.  Do not drive or operate machinery while taking this medicine.). 120 tablet 0  . metFORMIN (GLUCOPHAGE) 500 MG tablet Take 500 mg by mouth 2 (two) times daily with a meal.    . metoprolol succinate (TOPROL-XL) 50 MG 24 hr tablet Take 1 tablet by mouth daily.    . Omega-3 Fatty Acids (FISH OIL PO) Take 2 capsules by mouth 2 (two) times daily.     . pantoprazole (PROTONIX) 40 MG tablet Take 2 tablets (80 mg total) by mouth daily. 60 tablet 1   No current facility-administered medications for  this visit.     Physical Exam  Blood pressure 126/77, pulse 83, temperature 97.5 F (36.4 C), height '5\' 8"'$  (1.727 m), weight 172 lb 12.8 oz (78.382 kg).  Constitutional: overall normal hygiene, normal nutrition, well developed, normal grooming, normal body habitus. Assistive device:none  Musculoskeletal: gait and station Limp none, muscle tone and strength are normal, no tremors or atrophy is present.  .  Neurological: coordination overall normal.  Deep tendon reflex/nerve stretch intact.  Sensation normal.  Cranial nerves II-XII intact.   Skin:   normal overall no scars, lesions, ulcers or rashes. No psoriasis.  Psychiatric: Alert and oriented x 3.  Recent memory intact, remote memory unclear.  Normal mood and affect. Well groomed.  Good eye contact.  Cardiovascular: overall no swelling, no varicosities, no edema bilaterally, normal temperatures of the legs and arms, no clubbing, cyanosis and good capillary refill.  Lymphatic: palpation is normal.  Examination of left Upper Extremity is done.  Inspection:   Overall:  Elbow non-tender without crepitus or defects, forearm non-tender without crepitus or defects, wrist non-tender without crepitus or defects, hand non-tender.    Shoulder: with glenohumeral joint tenderness, without effusion.   Upper arm: without swelling and tenderness   Range of motion:   Overall:  Full range of motion of the elbow, full range of motion of wrist and full range of motion in fingers.   Shoulder:  left  180 degrees forward flexion; 165 degrees abduction; 35 degrees internal rotation, 35 degrees external rotation, 20 degrees extension, 40 degrees adduction.   Stability:   Overall:  Shoulder, elbow and wrist stable   Strength and Tone:   Overall full shoulder muscles strength, full upper arm strength and normal upper arm bulk and tone.  The patient has been educated about the nature of the problem(s) and counseled on treatment options.  The patient  appeared to understand what I have discussed and is in agreement with it.  Encounter Diagnosis  Name Primary?  . Left shoulder pain Yes    PLAN Call if any problems.  Precautions discussed.  Continue current medications.   Return to clinic 1 month   Electronically Signed Sanjuana Kava, MD 7/19/201711:24 PM

## 2016-06-15 ENCOUNTER — Ambulatory Visit (INDEPENDENT_AMBULATORY_CARE_PROVIDER_SITE_OTHER): Payer: Medicare Other | Admitting: Orthopaedic Surgery

## 2016-06-15 ENCOUNTER — Encounter: Payer: Self-pay | Admitting: Orthopaedic Surgery

## 2016-06-15 VITALS — BP 122/79 | HR 81 | Temp 97.7°F | Ht 68.0 in | Wt 174.0 lb

## 2016-06-15 DIAGNOSIS — I10 Essential (primary) hypertension: Secondary | ICD-10-CM

## 2016-06-15 DIAGNOSIS — Z720 Tobacco use: Secondary | ICD-10-CM

## 2016-06-15 DIAGNOSIS — M25512 Pain in left shoulder: Secondary | ICD-10-CM | POA: Diagnosis not present

## 2016-06-15 DIAGNOSIS — F17200 Nicotine dependence, unspecified, uncomplicated: Secondary | ICD-10-CM

## 2016-06-15 MED ORDER — HYDROCODONE-ACETAMINOPHEN 7.5-325 MG PO TABS: 1.0000 | ORAL_TABLET | ORAL | 0 refills | Status: DC | PRN

## 2016-06-15 NOTE — Progress Notes (Signed)
Patient Fred Hernandez, male DOB:22-Jan-1956, 60 y.o. VXB:[id number]  Chief Complaint  Patient presents with  . Follow-up    left shoulder pain    HPI  Fred Hernandez is a 60 y.o. male who has chronic left shoulder pain.  He has been doing his exercises at home daily.  He has slowly improved but still has pain with overhead use.  He has no paresthesias or new trauma.  He is taking his medicine.  He has been trying to cut back on smoking.  He has just begun to use the patch and is tolerating it well.  I have encouraged him to continue. HPI  Body mass index is 26.46 kg/m.  ROS  Review of Systems  Constitutional:       Patient has Diabetes Mellitus. Patient has hypertension. Patient has COPD or shortness of breath. Patient does not have BMI > 35. Patient has current smoking history  HENT: Negative for congestion.   Cardiovascular: Negative for chest pain.  Endocrine: Positive for cold intolerance.  Musculoskeletal: Positive for arthralgias and myalgias.  Allergic/Immunologic: Positive for environmental allergies.  Neurological: Negative for numbness.    Past Medical History:  Diagnosis Date  . CAD (coronary artery disease)     s/p stent placement unknown artery 12/2014,  . Diabetes mellitus ([place])   . GERD (gastroesophageal reflux disease)   . History of prostate cancer   . Hyperlipemia   . Hypertension   . Peripheral arterial disease ([place])    a. h/o stents. b. 03/2016  - successful PTA and covered stenting using overlapping Viabahn covered stents of a long segment in-stent restenosis of previously placed nitinol self expanding stents in the proximal mid and distal right SFA.  . Tobacco abuse     Past Surgical History:  Procedure Laterality Date  . CORONARY STENT PLACEMENT  12-2014   [facility]  . PERIPHERAL VASCULAR CATHETERIZATION N/A 04/04/2016   Procedure: Lower Extremity Angiography;  Surgeon: Lorretta Harp, MD;  Location: [facility];   Service: Cardiovascular;  Laterality: N/A;  . PERIPHERAL VASCULAR CATHETERIZATION N/A 04/04/2016   Procedure: Abdominal Aortogram;  Surgeon: Lorretta Harp, MD;  Location: [place] [facility];  Service: Cardiovascular;  Laterality: N/A;  . PERIPHERAL VASCULAR CATHETERIZATION  04/04/2016   Procedure: Peripheral Vascular Intervention;  Surgeon: Lorretta Harp, MD;  Location: [facility];  Service: Cardiovascular;;  rt SFA stent  . STENT PLACEMENT VASCULAR ([place] [facility])  2013    Family History  Problem Relation Age of Onset  . Diabetes Mother   . Heart disease Mother   . Hypertension Mother   . Healthy Brother   . Other Brother     accident   . Arthritis Sister   . Arthritis Sister   . SIDS Brother     Social History Social History  Substance Use Topics  . Smoking status: Current Every Day Smoker    Packs/day: 1.50    Types: Cigarettes    Start date: 02/12/1971  . Smokeless tobacco: Never Used  . Alcohol use 0.0 oz/week     Comment: socially    Allergies  Allergen Reactions  . Lisinopril Anaphylaxis  . Penicillins Anaphylaxis  . Ace Inhibitors Swelling    Current Outpatient Prescriptions  Medication Sig Dispense Refill  . aspirin 81 MG tablet Take 81 mg by mouth daily.    [name] atorvastatin (LIPITOR) 80 MG tablet Take 80 mg by mouth daily.    . clopidogrel (PLAVIX)  75 MG tablet Take 75 mg by mouth daily.    . fluticasone (FLONASE) 50 MCG/ACT nasal spray Place 1 spray into both nostrils daily.    [name] HYDROcodone-acetaminophen (NORCO) 7.5-325 MG tablet Take 1 tablet by mouth every 4 (four) hours as needed for moderate pain (Must last 30 days.  Do not drive or operate machinery while taking this medicine.). 120 tablet 0  . metFORMIN (GLUCOPHAGE) 500 MG tablet Take 500 mg by mouth 2 (two) times daily with a meal.    . metoprolol succinate (TOPROL-XL) 50 MG 24 hr tablet Take 1 tablet by mouth daily.    . Omega-3 Fatty Acids (FISH OIL PO) Take 2 capsules by mouth 2 (two) times  daily.     . pantoprazole (PROTONIX) 40 MG tablet Take 2 tablets (80 mg total) by mouth daily. 60 tablet 1   No current facility-administered medications for this visit.      Physical Exam  Blood pressure 122/79, pulse 81, temperature 97.7 F (36.5 C), height '5\' 8"'$  (1.727 m), weight 174 lb (78.9 kg).  Constitutional: overall normal hygiene, normal nutrition, well developed, normal grooming, normal body habitus. Assistive device:none  Musculoskeletal: gait and station Limp none, muscle tone and strength are normal, no tremors or atrophy is present.  .  Neurological: coordination overall normal.  Deep tendon reflex/nerve stretch intact.  Sensation normal.  Cranial nerves II-XII intact.   Skin:   normal overall no scars, lesions, ulcers or rashes. No psoriasis.  Psychiatric: Alert and oriented x 3.  Recent memory intact, remote memory unclear.  Normal mood and affect. Well groomed.  Good eye contact.  Cardiovascular: overall no swelling, no varicosities, no edema bilaterally, normal temperatures of the legs and arms, no clubbing, cyanosis and good capillary refill.  Lymphatic: palpation is normal.  Examination of left Upper Extremity is done.  Inspection:   Overall:  Elbow non-tender without crepitus or defects, forearm non-tender without crepitus or defects, wrist non-tender without crepitus or defects, hand non-tender.    Shoulder: with glenohumeral joint tenderness, without effusion.   Upper arm: without swelling and tenderness   Range of motion:   Overall:  Full range of motion of the elbow, full range of motion of wrist and full range of motion in fingers.   Shoulder:  left  165 degrees forward flexion; 140 degrees abduction; 35 degrees internal rotation, 35 degrees external rotation, 15 degrees extension, 40 degrees adduction.   Stability:   Overall:  Shoulder, elbow and wrist stable   Strength and Tone:   Overall full shoulder muscles strength, full upper arm strength and  normal upper arm bulk and tone.   The patient has been educated about the nature of the problem(s) and counseled on treatment options.  The patient appeared to understand what I have discussed and is in agreement with it.  Encounter Diagnoses  Name Primary?  . Left shoulder pain Yes  . Essential hypertension   . Tobacco smoker within last 12 months     PLAN Call if any problems.  Precautions discussed.  Continue current medications.   Return to clinic 1 month   Continue exercises.  Electronically Signed Sanjuana Kava, MD 8/16/20171:42 PM

## 2016-06-30 ENCOUNTER — Encounter (HOSPITAL_COMMUNITY): Payer: Self-pay | Admitting: *Deleted

## 2016-07-13 ENCOUNTER — Ambulatory Visit (INDEPENDENT_AMBULATORY_CARE_PROVIDER_SITE_OTHER): Payer: Medicare Other | Admitting: Orthopaedic Surgery

## 2016-07-13 ENCOUNTER — Encounter: Payer: Self-pay | Admitting: Orthopaedic Surgery

## 2016-07-13 VITALS — BP 115/69 | HR 81 | Ht 68.0 in | Wt 174.0 lb

## 2016-07-13 DIAGNOSIS — Z720 Tobacco use: Secondary | ICD-10-CM | POA: Diagnosis not present

## 2016-07-13 DIAGNOSIS — I10 Essential (primary) hypertension: Secondary | ICD-10-CM | POA: Diagnosis not present

## 2016-07-13 DIAGNOSIS — M25512 Pain in left shoulder: Secondary | ICD-10-CM

## 2016-07-13 DIAGNOSIS — F17200 Nicotine dependence, unspecified, uncomplicated: Secondary | ICD-10-CM

## 2016-07-13 MED ORDER — HYDROCODONE-ACETAMINOPHEN 7.5-325 MG PO TABS: 1.0000 | ORAL_TABLET | Freq: Four times a day (QID) | ORAL | 0 refills | Status: DC | PRN

## 2016-07-13 NOTE — Progress Notes (Signed)
Patient ST:Fred Hernandez, male DOB:Mar 07, 1956, 60 y.o. [id number]:[id number]  Chief Complaint  Patient presents with  . Follow-up    left shoulder pain    HPI  Fred Hernandez is a 60 y.o. male who has chronic left shoulder pain. He still has pain with overhead use.  He has been to OT and they say they can do no more and recommend MRI of the left shoulder.  I have read their notes and agree.  I will order MRI to rule out rotator cuff tear.  He has had medicine, rest, OT and still has pain and limitations of the left shoulder. HPI  Body mass index is 26.46 kg/m.  ROS  Review of Systems  Constitutional:       Patient has Diabetes Mellitus. Patient has hypertension. Patient has COPD or shortness of breath. Patient does not have BMI > 35. Patient has current smoking history  HENT: Negative for congestion.   Cardiovascular: Negative for chest pain.  Endocrine: Positive for cold intolerance.  Musculoskeletal: Positive for arthralgias and myalgias.  Allergic/Immunologic: Positive for environmental allergies.  Neurological: Negative for numbness.    Past Medical History:  Diagnosis Date  . CAD (coronary artery disease)     s/p stent placement unknown artery 12/2014,  . Diabetes mellitus ([place])   . GERD (gastroesophageal reflux disease)   . History of prostate cancer   . Hyperlipemia   . Hypertension   . Peripheral arterial disease ([place])    a. h/o stents. b. 03/2016  - successful PTA and covered stenting using overlapping Viabahn covered stents of a long segment in-stent restenosis of previously placed nitinol self expanding stents in the proximal mid and distal right SFA.  . Tobacco abuse     Past Surgical History:  Procedure Laterality Date  . CORONARY STENT PLACEMENT  12-2014   [facility]  . PERIPHERAL VASCULAR CATHETERIZATION N/A 04/04/2016   Procedure: Lower Extremity Angiography;  Surgeon: Lorretta Harp, MD;  Location: [place] [facility];  Service:  Cardiovascular;  Laterality: N/A;  . PERIPHERAL VASCULAR CATHETERIZATION N/A 04/04/2016   Procedure: Abdominal Aortogram;  Surgeon: Lorretta Harp, MD;  Location: [facility] [facility];  Service: Cardiovascular;  Laterality: N/A;  . PERIPHERAL VASCULAR CATHETERIZATION  04/04/2016   Procedure: Peripheral Vascular Intervention;  Surgeon: Lorretta Harp, MD;  Location: [place] [facility];  Service: Cardiovascular;;  rt SFA stent  . STENT PLACEMENT VASCULAR ([place] [facility])  2013    Family History  Problem Relation Age of Onset  . Diabetes Mother   . Heart disease Mother   . Hypertension Mother   . Healthy Brother   . Other Brother     accident   . Arthritis Sister   . Arthritis Sister   . SIDS Brother     Social History Social History  Substance Use Topics  . Smoking status: Current Every Day Smoker    Packs/day: 1.50    Types: Cigarettes    Start date: 02/12/1971  . Smokeless tobacco: Never Used  . Alcohol use 0.0 oz/week     Comment: socially    Allergies  Allergen Reactions  . Lisinopril Anaphylaxis  . Penicillins Anaphylaxis  . Ace Inhibitors Swelling    Current Outpatient Prescriptions  Medication Sig Dispense Refill  . azithromycin (ZITHROMAX) 1 g powder Take 1 g by mouth once.    [name] aspirin 81 MG tablet Take 81 mg by mouth daily.    [name] atorvastatin (LIPITOR) 80 MG tablet  Take 80 mg by mouth daily.    . clopidogrel (PLAVIX) 75 MG tablet Take 75 mg by mouth daily.    . fluticasone (FLONASE) 50 MCG/ACT nasal spray Place 1 spray into both nostrils daily.    [name] HYDROcodone-acetaminophen (NORCO) 7.5-325 MG tablet Take 1 tablet by mouth every 6 (six) hours as needed for moderate pain (Must last 30 days.Do not drive or operate machinery while taking this medicine.). 110 tablet 0  . metFORMIN (GLUCOPHAGE) 500 MG tablet Take 500 mg by mouth 2 (two) times daily with a meal.    . metoprolol succinate (TOPROL-XL) 50 MG 24 hr tablet Take 1 tablet by mouth daily.    . Omega-3 Fatty Acids  (FISH OIL PO) Take 2 capsules by mouth 2 (two) times daily.     . pantoprazole (PROTONIX) 40 MG tablet Take 2 tablets (80 mg total) by mouth daily. 60 tablet 1   No current facility-administered medications for this visit.      Physical Exam  Blood pressure 115/69, pulse 81, height '5\' 8"'$  (1.727 m), weight 174 lb (78.9 kg).  Constitutional: overall normal hygiene, normal nutrition, well developed, normal grooming, normal body habitus. Assistive device:none  Musculoskeletal: gait and station Limp none, muscle tone and strength are normal, no tremors or atrophy is present.  .  Neurological: coordination overall normal.  Deep tendon reflex/nerve stretch intact.  Sensation normal.  Cranial nerves II-XII intact.   Skin:   Normal overall no scars, lesions, ulcers or rashes. No psoriasis.  Psychiatric: Alert and oriented x 3.  Recent memory intact, remote memory unclear.  Normal mood and affect. Well groomed.  Good eye contact.  Cardiovascular: overall no swelling, no varicosities, no edema bilaterally, normal temperatures of the legs and arms, no clubbing, cyanosis and good capillary refill.  Lymphatic: palpation is normal.  Examination of left Upper Extremity is done.  Inspection:   Overall:  Elbow non-tender without crepitus or defects, forearm non-tender without crepitus or defects, wrist non-tender without crepitus or defects, hand non-tender.    Shoulder: with glenohumeral joint tenderness, without effusion.   Upper arm: without swelling and tenderness   Range of motion:   Overall:  Full range of motion of the elbow, full range of motion of wrist and full range of motion in fingers.   Shoulder:  left  165 degrees forward flexion; 140 degrees abduction; 30 degrees internal rotation, 30 degrees external rotation, 15 degrees extension, 40 degrees adduction.   Stability:   Overall:  Shoulder, elbow and wrist stable   Strength and Tone:   Overall full shoulder muscles strength, full  upper arm strength and normal upper arm bulk and tone.   The patient has been educated about the nature of the problem(s) and counseled on treatment options.  The patient appeared to understand what I have discussed and is in agreement with it.  Encounter Diagnoses  Name Primary?  . Left shoulder pain Yes  . Essential hypertension   . Tobacco smoker within last 12 months     PLAN Call if any problems.  Precautions discussed.  Continue current medications.   Return to clinic after MRI of the left shoulder   Electronically Signed Sanjuana Kava, MD 9/13/20179:56 AM

## 2016-07-22 ENCOUNTER — Ambulatory Visit: Payer: Medicare Other | Admitting: Urology

## 2016-07-28 ENCOUNTER — Ambulatory Visit
Admission: RE | Admit: 2016-07-28 | Discharge: 2016-07-28 | Disposition: A | Discharge status: Home / Self Care | Payer: Medicare Other | Source: Ambulatory Visit | Attending: Orthopaedic Surgery | Admitting: Orthopaedic Surgery

## 2016-07-28 DIAGNOSIS — M25512 Pain in left shoulder: Secondary | ICD-10-CM

## 2016-08-09 ENCOUNTER — Ambulatory Visit: Payer: Medicare Other | Admitting: Orthopaedic Surgery

## 2016-08-17 ENCOUNTER — Ambulatory Visit (INDEPENDENT_AMBULATORY_CARE_PROVIDER_SITE_OTHER): Payer: Medicare Other | Admitting: Orthopaedic Surgery

## 2016-08-17 VITALS — BP 110/67 | HR 78 | Temp 97.2°F | Ht 68.0 in | Wt 175.0 lb

## 2016-08-17 DIAGNOSIS — M25512 Pain in left shoulder: Secondary | ICD-10-CM

## 2016-08-17 DIAGNOSIS — G8929 Other chronic pain: Secondary | ICD-10-CM

## 2016-08-17 DIAGNOSIS — F17210 Nicotine dependence, cigarettes, uncomplicated: Secondary | ICD-10-CM

## 2016-08-17 DIAGNOSIS — I10 Essential (primary) hypertension: Secondary | ICD-10-CM

## 2016-08-17 MED ORDER — HYDROCODONE-ACETAMINOPHEN 7.5-325 MG PO TABS: 1.0000 | ORAL_TABLET | Freq: Four times a day (QID) | ORAL | 0 refills | Status: DC | PRN

## 2016-08-17 NOTE — Progress Notes (Signed)
Patient Fred Hernandez, male DOB:1956-09-12, 60 y.o. KVQ:[id number]  Chief Complaint  Patient presents with  . Results    MRI Left Shoulder    HPI  Fred Hernandez is a 60 y.o. male who has left shoulder pain. He had the MRI done and it showed: IMPRESSION: 1. Mild tendinosis of the supraspinatus tendon without a tear. 2. Mild tenosynovitis of the proximal extra-articular portion of the long head of the biceps tendon.  I have explained the findings to him.  He does not want to go to PT.  He will do his exercises at home.  He will continue his present medicine.  He is still smoking but trying to cut back.  He is well motivated.  HPI  Body mass index is 26.61 kg/m.  ROS  Review of Systems  Constitutional:       Patient has Diabetes Mellitus. Patient has hypertension. Patient has COPD or shortness of breath. Patient does not have BMI > 35. Patient has current smoking history  HENT: Negative for congestion.   Cardiovascular: Negative for chest pain.  Endocrine: Positive for cold intolerance.  Musculoskeletal: Positive for arthralgias and myalgias.  Allergic/Immunologic: Positive for environmental allergies.  Neurological: Negative for numbness.    Past Medical History:  Diagnosis Date  . CAD (coronary artery disease)     s/p stent placement unknown artery 12/2014,  . Diabetes mellitus ([place])   . GERD (gastroesophageal reflux disease)   . History of prostate cancer   . Hyperlipemia   . Hypertension   . Peripheral arterial disease ([place])    a. h/o stents. b. 03/2016  - successful PTA and covered stenting using overlapping Viabahn covered stents of a long segment in-stent restenosis of previously placed nitinol self expanding stents in the proximal mid and distal right SFA.  . Tobacco abuse     Past Surgical History:  Procedure Laterality Date  . CORONARY STENT PLACEMENT  12-2014   [facility]  . PERIPHERAL VASCULAR CATHETERIZATION N/A 04/04/2016   Procedure: Lower Extremity Angiography;  Surgeon: Lorretta Harp, MD;  Location: [facility];  Service: Cardiovascular;  Laterality: N/A;  . PERIPHERAL VASCULAR CATHETERIZATION N/A 04/04/2016   Procedure: Abdominal Aortogram;  Surgeon: Lorretta Harp, MD;  Location: [facility];  Service: Cardiovascular;  Laterality: N/A;  . PERIPHERAL VASCULAR CATHETERIZATION  04/04/2016   Procedure: Peripheral Vascular Intervention;  Surgeon: Lorretta Harp, MD;  Location: [facility];  Service: Cardiovascular;;  rt SFA stent  . STENT PLACEMENT VASCULAR ([place] [facility])  2013    Family History  Problem Relation Age of Onset  . Diabetes Mother   . Heart disease Mother   . Hypertension Mother   . Healthy Brother   . Other Brother     accident   . Arthritis Sister   . Arthritis Sister   . SIDS Brother     Social History Social History  Substance Use Topics  . Smoking status: Current Every Day Smoker    Packs/day: 1.50    Types: Cigarettes    Start date: 02/12/1971  . Smokeless tobacco: Never Used  . Alcohol use 0.0 oz/week     Comment: socially    Allergies  Allergen Reactions  . Lisinopril Anaphylaxis  . Penicillins Anaphylaxis  . Ace Inhibitors Swelling    Current Outpatient Prescriptions  Medication Sig Dispense Refill  . co-enzyme Q-10 30 MG capsule Take 30 mg by mouth 3 (three) times daily.    [name]  Magnesium 100 MG TABS Take by mouth.    [name] aspirin 81 MG tablet Take 81 mg by mouth daily.    [name] atorvastatin (LIPITOR) 80 MG tablet Take 80 mg by mouth daily.    [name] azithromycin (ZITHROMAX) 1 g powder Take 1 g by mouth once.    . clopidogrel (PLAVIX) 75 MG tablet Take 75 mg by mouth daily.    . fluticasone (FLONASE) 50 MCG/ACT nasal spray Place 1 spray into both nostrils daily.    [name] HYDROcodone-acetaminophen (NORCO) 7.5-325 MG tablet Take 1 tablet by mouth every 6 (six) hours as needed for moderate pain (Must last 30 days.Do not drive or operate machinery while taking this  medicine.). 110 tablet 0  . metFORMIN (GLUCOPHAGE) 500 MG tablet Take 500 mg by mouth 2 (two) times daily with a meal.    . metoprolol succinate (TOPROL-XL) 50 MG 24 hr tablet Take 1 tablet by mouth daily.    . Omega-3 Fatty Acids (FISH OIL PO) Take 2 capsules by mouth 2 (two) times daily.     . pantoprazole (PROTONIX) 40 MG tablet Take 2 tablets (80 mg total) by mouth daily. 60 tablet 1   No current facility-administered medications for this visit.      Physical Exam  Blood pressure 110/67, pulse 78, temperature 97.2 F (36.2 C), height '5\' 8"'$  (1.727 m), weight 175 lb (79.4 kg).  Constitutional: overall normal hygiene, normal nutrition, well developed, normal grooming, normal body habitus. Assistive device:none  Musculoskeletal: gait and station Limp none, muscle tone and strength are normal, no tremors or atrophy is present.  .  Neurological: coordination overall normal.  Deep tendon reflex/nerve stretch intact.  Sensation normal.  Cranial nerves II-XII intact.   Skin:   Normal overall no scars, lesions, ulcers or rashes. No psoriasis.  Psychiatric: Alert and oriented x 3.  Recent memory intact, remote memory unclear.  Normal mood and affect. Well groomed.  Good eye contact.  Cardiovascular: overall no swelling, no varicosities, no edema bilaterally, normal temperatures of the legs and arms, no clubbing, cyanosis and good capillary refill.  Lymphatic: palpation is normal.   Examination of left Upper Extremity is done.  Inspection:   Overall:  Elbow non-tender without crepitus or defects, forearm non-tender without crepitus or defects, wrist non-tender without crepitus or defects, hand non-tender.    Shoulder: with glenohumeral joint tenderness, without effusion.   Upper arm: without swelling and tenderness   Range of motion:   Overall:  Full range of motion of the elbow, full range of motion of wrist and full range of motion in fingers.   Shoulder:  left  full degrees forward  flexion; 165 degrees abduction; full degrees internal rotation, full degrees external rotation, 20 degrees extension, full degrees adduction.   Stability:   Overall:  Shoulder, elbow and wrist stable   Strength and Tone:   Overall full shoulder muscles strength, full upper arm strength and normal upper arm bulk and tone.  The patient has been educated about the nature of the problem(s) and counseled on treatment options.  The patient appeared to understand what I have discussed and is in agreement with it.  Encounter Diagnoses  Name Primary?  . Chronic left shoulder pain Yes  . Essential hypertension   . Cigarette nicotine dependence without complication     PLAN Call if any problems.  Precautions discussed.  Continue current medications.   Return to clinic 1 month   Electronically Signed Sanjuana Kava, MD 10/18/201710:44 AM

## 2016-08-17 NOTE — Addendum Note (Signed)
Addended by: Moreen Fowler R on: 08/17/2016 10:52 AM   Modules accepted: Orders

## 2016-08-17 NOTE — Patient Instructions (Signed)
Shoulder Range of Motion Exercises Shoulder range of motion (ROM) exercises are designed to keep the shoulder moving freely. They are often recommended for people who have shoulder pain. MOVEMENT EXERCISE When you are able, do this exercise 5-6 days per week, or as told by your health care provider. Work toward doing 2 sets of 10 swings. Pendulum Exercise How To Do This Exercise Lying Down 1. Lie face-down on a bed with your abdomen close to the side of the bed. 2. Let your arm hang over the side of the bed. 3. Relax your shoulder, arm, and hand. 4. Slowly and gently swing your arm forward and back. Do not use your neck muscles to swing your arm. They should be relaxed. If you are struggling to swing your arm, have someone gently swing it for you. When you do this exercise for the first time, swing your arm at a 15 degree angle for 15 seconds, or swing your arm 10 times. As pain lessens over time, increase the angle of the swing to 30-45 degrees. 5. Repeat steps 1-4 with the other arm. How To Do This Exercise While Standing 1. Stand next to a sturdy chair or table and hold on to it with your hand.  Bend forward at the waist.  Bend your knees slightly.  Relax your other arm and let it hang limp.  Relax the shoulder blade of the arm that is hanging and let it drop.  While keeping your shoulder relaxed, use body motion to swing your arm in small circles. The first time you do this exercise, swing your arm for about 30 seconds or 10 times. When you do it next time, swing your arm for a little longer.  Stand up tall and relax.  Repeat steps 1-7, this time changing the direction of the circles. 2. Repeat steps 1-8 with the other arm. STRETCHING EXERCISES Do these exercises 3-4 times per day on 5-6 days per week or as told by your health care provider. Work toward holding the stretch for 20 seconds. Stretching Exercise 1 1. Lift your arm straight out in front of you. 2. Bend your arm 90  degrees at the elbow (right angle) so your forearm goes across your body and looks like the letter "L." 3. Use your other arm to gently pull the elbow forward and across your body. 4. Repeat steps 1-3 with the other arm. Stretching Exercise 2 You will need a towel or rope for this exercise. 1. Bend one arm behind your back with the palm facing outward. 2. Hold a towel with your other hand. 3. Reach the arm that holds the towel above your head, and bend that arm at the elbow. Your wrist should be behind your neck. 4. Use your free hand to grab the free end of the towel. 5. With the higher hand, gently pull the towel up behind you. 6. With the lower hand, pull the towel down behind you. 7. Repeat steps 1-6 with the other arm. STRENGTHENING EXERCISES Do each of these exercises at four different times of day (sessions) every day or as told by your health care provider. To begin with, repeat each exercise 5 times (repetitions). Work toward doing 3 sets of 12 repetitions or as told by your health care provider. Strengthening Exercise 1 You will need a light weight for this activity. As you grow stronger, you may use a heavier weight. 1. Standing with a weight in your hand, lift your arm straight out to the side  until it is at the same height as your shoulder. 2. Bend your arm at 90 degrees so that your fingers are pointing to the ceiling. 3. Slowly raise your hand until your arm is straight up in the air. 4. Repeat steps 1-3 with the other arm. Strengthening Exercise 2 You will need a light weight for this activity. As you grow stronger, you may use a heavier weight. 1. Standing with a weight in your hand, gradually move your straight arm in an arc, starting at your side, then out in front of you, then straight up over your head. 2. Gradually move your other arm in an arc, starting at your side, then out in front of you, then straight up over your head. 3. Repeat steps 1-2 with the other  arm. Strengthening Exercise 3 You will need an elastic band for this activity. As you grow stronger, gradually increase the size of the bands or increase the number of bands that you use at one time. 1. While standing, hold an elastic band in one hand and raise that arm up in the air. 2. With your other hand, pull down the band until that hand is by your side. 3. Repeat steps 1-2 with the other arm.   This information is not intended to replace advice given to you by your health care provider. Make sure you discuss any questions you have with your health care provider.   Document Released: 07/16/2003 Document Revised: 03/03/2015 Document Reviewed: 10/13/2014 Elsevier Interactive Patient Education [employer].

## 2016-08-24 ENCOUNTER — Telehealth: Payer: Self-pay

## 2016-08-24 NOTE — Telephone Encounter (Signed)
[phone]  PATIENT RECEIVED LETTER TO SCHEDULE TCS

## 2016-08-31 NOTE — Telephone Encounter (Signed)
I called and spoke with his friend, Fred Hernandez. Pt has hx of cancerous polyps and had to have radiation after the last colonoscopy. Referred from [facility]. [name] will try to go by and get copies of the last reports and bring by before appt.  Leaving this info for susan to follow up on before his appt. He has been scheduled an OV with Walden Field, NP on 09/08/2016 @ 9:30 Am.

## 2016-09-06 NOTE — Telephone Encounter (Signed)
Patient rescheduled his OV to 11/21 and I sent a request for records to [facility].

## 2016-09-08 ENCOUNTER — Ambulatory Visit: Payer: Medicare Other | Admitting: Nurse Practitioner

## 2016-09-08 NOTE — Telephone Encounter (Signed)
Noted  

## 2016-09-15 ENCOUNTER — Telehealth: Payer: Self-pay | Admitting: Orthopaedic Surgery

## 2016-09-15 ENCOUNTER — Other Ambulatory Visit: Payer: Self-pay | Admitting: *Deleted

## 2016-09-15 MED ORDER — HYDROCODONE-ACETAMINOPHEN 7.5-325 MG PO TABS
1.0000 | ORAL_TABLET | Freq: Four times a day (QID) | ORAL | 0 refills | Status: DC | PRN
Start: 2016-09-15 — End: 2016-10-18

## 2016-09-15 NOTE — Telephone Encounter (Signed)
Hydrocodone-Acetaminophen  7.5/'325mg'$  Qty 110 Tablets  Take 1 tablet by mouth every 6(six) hours as needed for moderate pain (Must last 30 days. Do not drive or operate machinery while taking this medicine.).

## 2016-09-20 ENCOUNTER — Telehealth: Payer: Self-pay

## 2016-09-20 ENCOUNTER — Ambulatory Visit (INDEPENDENT_AMBULATORY_CARE_PROVIDER_SITE_OTHER): Payer: Medicare Other | Admitting: Nurse Practitioner

## 2016-09-20 ENCOUNTER — Encounter: Payer: Self-pay | Admitting: Nurse Practitioner

## 2016-09-20 DIAGNOSIS — Z86010 Personal history of colonic polyps: Secondary | ICD-10-CM

## 2016-09-20 NOTE — Assessment & Plan Note (Addendum)
The patient has a history of colon polyps. His last colonoscopy was in 2010 and he cannot remember when it was recommended he have a repeat exam. His last colonoscopy was completed by Dr. Earley Brooke in [place], [place] at [facility]. We will request these records. He is generally asymptomatic from a GI standpoint today.  The patient has also seen cardiology. He is currently on dual antiplatelet therapy and has had cardiac stenting as well as peripheral stenting. His last stent was placed about 5-6 months ago peripherally. There is no indication to hold Plavix or aspirin for colonoscopy, and a general sense. I will request clearance from cardiology just to make sure there is no underlying cardiac etiology that I have not seen in review of his records.  Tentatively scheduled the patient for colonoscopy on propofol/MAC with Dr. Oneida Alar in the near future. The risks, benefits, and alternatives have been discussed in detail with the patient. They state understanding and desire to proceed.   The patient is currently on Plavix, hydrocodone, drinks about 3 times a week with occasional binges, denies drug use. We will plan for the procedure on propofol/MAC to promote adequate sedation.

## 2016-09-20 NOTE — Telephone Encounter (Signed)
Can proceed, but would not stop ASA or Plavix.

## 2016-09-20 NOTE — Progress Notes (Signed)
cc'ed to pcp °

## 2016-09-20 NOTE — Telephone Encounter (Signed)
Dr. Jacinta Shoe,  This mutual patient was seen in our office today by Walden Field, NP.  He would like cardiac clearance to schedule a colonoscopy in the every near future.  Please advise and thanks so much for your help.   Daksh Coates H. Nicole Kindred, LPN

## 2016-09-20 NOTE — Telephone Encounter (Signed)
Forwarding to Walden Field, NP and Ginger.

## 2016-09-20 NOTE — Patient Instructions (Signed)
1. We will contact cardiology to get clearance for your procedure. 2. Presuming we get clearance, we will tentatively schedule your colonoscopy. 3. We will request records from your previous colonoscopy in [place], [place]. 4. We'll get clearance from cardiology we will finalize scheduling over the phone. 5. Return for follow-up based on postprocedure recommendations.

## 2016-09-20 NOTE — Progress Notes (Addendum)
REVIEWED-NO ADDITIONAL RECOMMENDATIONS.  Primary Care Physician:  Vesta Mixer Primary Gastroenterologist:  Dr. Oneida Alar  Chief Complaint  Patient presents with  . Colonoscopy    hx polyps, last TCS approx 11 years    HPI:   Fred Hernandez is a 60 y.o. male who presents on referral from primary care for scheduling of repeat colonoscopy. Per phone note, the patient had a history of "cancerous polyp" and had to have radiation after last colonoscopy. We had requested records. The records were requested from [facility].  No record of colonoscopy or endoscopy in our system.  Review of previous GI records. Last saw GI 11/24/2009 which notes multiple adenomas in December 2010, recommended repeat colonoscopy in one year or earlier. Adenomas were found at the ileocecal valve, hepatic flexure, 50 cm in the colon, 40 cm in the colon. These were removed of adenomatous polyp and tubular adenoma.  Today he states his last colonoscopy was in 2010 and states he had polyps in 2010, not sure when they  recommended repeat colonoscopy. Denies abdominal pain, N/V, hematochezia, melena. Has a bowel movement daily, occasionally/rarely constipated which is diet dependent. Denies fever, chills, unintentional weight loss. Denies chest pain, dyspnea, dizziness, lightheadedness, syncope, near syncope. Denies any other upper or lower GI symptoms.  Had cardiac stenting at [facility]. Peripheral stenting Apr 05 2016 in [place] with Dr. Gwenlyn Found.  Past Medical History:  Diagnosis Date  . CAD (coronary artery disease)     s/p stent placement unknown artery 12/2014,  . Diabetes mellitus ([place])   . GERD (gastroesophageal reflux disease)   . History of prostate cancer   . Hyperlipemia   . Hypertension   . Peripheral arterial disease ([place])    a. h/o stents. b. 03/2016  - successful PTA and covered stenting using overlapping Viabahn covered stents of a long segment in-stent restenosis of previously  placed nitinol self expanding stents in the proximal mid and distal right SFA.  . Tobacco abuse     Past Surgical History:  Procedure Laterality Date  . CORONARY STENT PLACEMENT  12-2014   [facility]  . PERIPHERAL VASCULAR CATHETERIZATION N/A 04/04/2016   Procedure: Lower Extremity Angiography;  Surgeon: Lorretta Harp, MD;  Location: [place] [facility];  Service: Cardiovascular;  Laterality: N/A;  . PERIPHERAL VASCULAR CATHETERIZATION N/A 04/04/2016   Procedure: Abdominal Aortogram;  Surgeon: Lorretta Harp, MD;  Location: [place] [facility];  Service: Cardiovascular;  Laterality: N/A;  . PERIPHERAL VASCULAR CATHETERIZATION  04/04/2016   Procedure: Peripheral Vascular Intervention;  Surgeon: Lorretta Harp, MD;  Location: [place] [facility];  Service: Cardiovascular;;  rt SFA stent  . STENT PLACEMENT VASCULAR ([place] HX)  2013    Current Outpatient Prescriptions  Medication Sig Dispense Refill  . aspirin 81 MG tablet Take 81 mg by mouth daily.    [name] atorvastatin (LIPITOR) 80 MG tablet Take 80 mg by mouth daily.    . Calcium-Magnesium 250-125 MG TABS Take by mouth daily.     . clopidogrel (PLAVIX) 75 MG tablet Take 75 mg by mouth daily.    [name] co-enzyme Q-10 30 MG capsule Take 30 mg by mouth 3 (three) times daily.    . fluticasone (FLONASE) 50 MCG/ACT nasal spray Place 1 spray into both nostrils daily.    [name] HYDROcodone-acetaminophen (NORCO) 7.5-325 MG tablet Take 1 tablet by mouth every 6 (six) hours as needed for moderate pain (Must last 30 days.Do not drive or operate  machinery while taking this medicine.). 110 tablet 0  . metFORMIN (GLUCOPHAGE) 500 MG tablet Take 500 mg by mouth 2 (two) times daily with a meal.    . metoprolol succinate (TOPROL-XL) 50 MG 24 hr tablet Take 1 tablet by mouth daily.    . Omega-3 Fatty Acids (FISH OIL PO) Take 2 capsules by mouth 2 (two) times daily.     . pantoprazole (PROTONIX) 40 MG tablet Take 2 tablets (80 mg total) by mouth daily.  60 tablet 1  . Specialty Vitamins Products (MAGNESIUM, AMINO ACID CHELATE,) 133 MG tablet Take 1 tablet by mouth 2 (two) times daily.     No current facility-administered medications for this visit.     Allergies as of 09/20/2016 - Review Complete 09/20/2016  Allergen Reaction Noted  . Lisinopril Anaphylaxis 12/23/2015  . Penicillins Anaphylaxis 12/23/2015  . Ace inhibitors Swelling 12/23/2015    Family History  Problem Relation Age of Onset  . Diabetes Mother   . Heart disease Mother   . Hypertension Mother   . Healthy Brother   . Other Brother     accident   . Arthritis Sister   . Arthritis Sister   . SIDS Brother   . Colon cancer Neg Hx     Social History   Social History  . Marital status: Single    Spouse name: N/A  . Number of children: N/A  . Years of education: N/A   Occupational History  . Not on file.   Social History Main Topics  . Smoking status: Current Every Day Smoker    Packs/day: 1.50    Types: Cigarettes    Start date: 02/12/1971  . Smokeless tobacco: Never Used  . Alcohol use 0.0 oz/week     Comment: Socially: beer or liquor about 3 times a week; occasional binge.  . Drug use: No  . Sexual activity: Not on file   Other Topics Concern  . Not on file   Social History Narrative  . No narrative on file    Review of Systems: Complete ROS negative except as per HPI.    Physical Exam: BP (!) 148/90   Pulse 88   Temp 98.3 F (36.8 C) (Oral)   Ht '5\' 8"'$  (1.727 m)   Wt 175 lb 12.8 oz (79.7 kg)   BMI 26.73 kg/m  General:   Alert and oriented. Pleasant and cooperative. Well-nourished and well-developed.  Eyes:  Without icterus, sclera clear and conjunctiva pink.  Ears:  Normal auditory acuity. Cardiovascular:  S1, S2 present without murmurs appreciated. Extremities without clubbing or edema. Respiratory:  Clear to auscultation bilaterally. No wheezes, rales, or rhonchi. No distress.  Gastrointestinal:  +BS, soft, non-tender and  non-distended. No HSM noted. No guarding or rebound. No masses appreciated.  Rectal:  Deferred  Musculoskalatal:  Symmetrical without gross deformities.  Neurologic:  Alert and oriented x4;  grossly normal neurologically. Psych:  Alert and cooperative. Normal mood and affect. Heme/Lymph/Immune: No excessive bruising noted.    09/20/2016 9:53 AM   Disclaimer: This note was dictated with voice recognition software. Similar sounding words can inadvertently be transcribed and may not be corrected upon review.

## 2016-09-21 ENCOUNTER — Encounter: Payer: Self-pay | Admitting: Orthopaedic Surgery

## 2016-09-21 ENCOUNTER — Ambulatory Visit (INDEPENDENT_AMBULATORY_CARE_PROVIDER_SITE_OTHER): Payer: Medicare Other | Admitting: Orthopaedic Surgery

## 2016-09-21 VITALS — BP 124/77 | HR 81 | Temp 97.5°F | Wt 176.0 lb

## 2016-09-21 DIAGNOSIS — I10 Essential (primary) hypertension: Secondary | ICD-10-CM

## 2016-09-21 DIAGNOSIS — F17210 Nicotine dependence, cigarettes, uncomplicated: Secondary | ICD-10-CM

## 2016-09-21 DIAGNOSIS — G8929 Other chronic pain: Secondary | ICD-10-CM

## 2016-09-21 DIAGNOSIS — M25512 Pain in left shoulder: Secondary | ICD-10-CM

## 2016-09-21 NOTE — Progress Notes (Signed)
Patient SW:Fred Hernandez, male DOB:08-Dec-1955, 60 y.o. TFT:[id number]  Chief Complaint  Patient presents with  . Follow-up    LEFT SHOULDER PAIN    HPI  Fred Hernandez is a 60 y.o. male who has chronic pain of the left shoulder.  He is better and has less pain.  He has been doing his exercises and taking his medicine.  He continues to smoke but is trying to cut back. HPI  Body mass index is 26.76 kg/m.  ROS  Review of Systems  Constitutional:       Patient has Diabetes Mellitus. Patient has hypertension. Patient has COPD or shortness of breath. Patient does not have BMI > 35. Patient has current smoking history  HENT: Negative for congestion.   Cardiovascular: Negative for chest pain.  Endocrine: Positive for cold intolerance.  Musculoskeletal: Positive for arthralgias and myalgias.  Allergic/Immunologic: Positive for environmental allergies.  Neurological: Negative for numbness.    Past Medical History:  Diagnosis Date  . CAD (coronary artery disease)     s/p stent placement unknown artery 12/2014,  . Diabetes mellitus ([place])   . GERD (gastroesophageal reflux disease)   . History of prostate cancer   . Hyperlipemia   . Hypertension   . Peripheral arterial disease ([place])    a. h/o stents. b. 03/2016  - successful PTA and covered stenting using overlapping Viabahn covered stents of a long segment in-stent restenosis of previously placed nitinol self expanding stents in the proximal mid and distal right SFA.  . Tobacco abuse     Past Surgical History:  Procedure Laterality Date  . CORONARY STENT PLACEMENT  12-2014   [facility]  . PERIPHERAL VASCULAR CATHETERIZATION N/A 04/04/2016   Procedure: Lower Extremity Angiography;  Surgeon: Lorretta Harp, MD;  Location: [place] [facility];  Service: Cardiovascular;  Laterality: N/A;  . PERIPHERAL VASCULAR CATHETERIZATION N/A 04/04/2016   Procedure: Abdominal Aortogram;  Surgeon: Lorretta Harp, MD;   Location: [place] [facility];  Service: Cardiovascular;  Laterality: N/A;  . PERIPHERAL VASCULAR CATHETERIZATION  04/04/2016   Procedure: Peripheral Vascular Intervention;  Surgeon: Lorretta Harp, MD;  Location: [place] [facility];  Service: Cardiovascular;;  rt SFA stent  . STENT PLACEMENT VASCULAR ([place] HX)  2013    Family History  Problem Relation Age of Onset  . Diabetes Mother   . Heart disease Mother   . Hypertension Mother   . Healthy Brother   . Other Brother     accident   . Arthritis Sister   . Arthritis Sister   . SIDS Brother   . Colon cancer Neg Hx     Social History Social History  Substance Use Topics  . Smoking status: Current Every Day Smoker    Packs/day: 1.50    Types: Cigarettes    Start date: 02/12/1971  . Smokeless tobacco: Never Used  . Alcohol use 0.0 oz/week     Comment: Socially: beer or liquor about 3 times a week; occasional binge.    Allergies  Allergen Reactions  . Lisinopril Anaphylaxis  . Penicillins Anaphylaxis  . Ace Inhibitors Swelling    Current Outpatient Prescriptions  Medication Sig Dispense Refill  . aspirin 81 MG tablet Take 81 mg by mouth daily.    [name] atorvastatin (LIPITOR) 80 MG tablet Take 80 mg by mouth daily.    . Calcium-Magnesium 250-125 MG TABS Take by mouth daily.     . clopidogrel (PLAVIX) 75 MG tablet Take 75  mg by mouth daily.    [name] co-enzyme Q-10 30 MG capsule Take 30 mg by mouth 3 (three) times daily.    . fluticasone (FLONASE) 50 MCG/ACT nasal spray Place 1 spray into both nostrils daily.    [name] HYDROcodone-acetaminophen (NORCO) 7.5-325 MG tablet Take 1 tablet by mouth every 6 (six) hours as needed for moderate pain (Must last 30 days.Do not drive or operate machinery while taking this medicine.). 110 tablet 0  . metFORMIN (GLUCOPHAGE) 500 MG tablet Take 500 mg by mouth 2 (two) times daily with a meal.    . metoprolol succinate (TOPROL-XL) 50 MG 24 hr tablet Take 1 tablet by mouth daily.    . Omega-3 Fatty Acids  (FISH OIL PO) Take 2 capsules by mouth 2 (two) times daily.     . pantoprazole (PROTONIX) 40 MG tablet Take 2 tablets (80 mg total) by mouth daily. 60 tablet 1  . Specialty Vitamins Products (MAGNESIUM, AMINO ACID CHELATE,) 133 MG tablet Take 1 tablet by mouth 2 (two) times daily.     No current facility-administered medications for this visit.      Physical Exam  Blood pressure 124/77, pulse 81, temperature 97.5 F (36.4 C), weight 176 lb (79.8 kg).  Constitutional: overall normal hygiene, normal nutrition, well developed, normal grooming, normal body habitus. Assistive device:none  Musculoskeletal: gait and station Limp none, muscle tone and strength are normal, no tremors or atrophy is present.  .  Neurological: coordination overall normal.  Deep tendon reflex/nerve stretch intact.  Sensation normal.  Cranial nerves II-XII intact.   Skin:   Normal overall no scars, lesions, ulcers or rashes. No psoriasis.  Psychiatric: Alert and oriented x 3.  Recent memory intact, remote memory unclear.  Normal mood and affect. Well groomed.  Good eye contact.  Cardiovascular: overall no swelling, no varicosities, no edema bilaterally, normal temperatures of the legs and arms, no clubbing, cyanosis and good capillary refill.  Lymphatic: palpation is normal.  Examination of left Upper Extremity is done.  Inspection:   Overall:  Elbow non-tender without crepitus or defects, forearm non-tender without crepitus or defects, wrist non-tender without crepitus or defects, hand non-tender.    Shoulder: with glenohumeral joint tenderness, without effusion.   Upper arm: with swelling and tenderness   Range of motion:   Overall:  Full range of motion of the elbow, full range of motion of wrist and full range of motion in fingers.   Shoulder:  left  165 degrees forward flexion; 145 degrees abduction; 30 degrees internal rotation, 30 degrees external rotation, 15 degrees extension, 40 degrees  adduction.   Stability:   Overall:  Shoulder, elbow and wrist stable   Strength and Tone:   Overall full shoulder muscles strength, full upper arm strength and normal upper arm bulk and tone.   The patient has been educated about the nature of the problem(s) and counseled on treatment options.  The patient appeared to understand what I have discussed and is in agreement with it.  Encounter Diagnoses  Name Primary?  . Chronic left shoulder pain Yes  . Essential hypertension   . Cigarette nicotine dependence without complication     PLAN Call if any problems.  Precautions discussed.  Continue current medications.   Return to clinic 7 weeks   Electronically Signed Sanjuana Kava, MD 11/22/201710:47 AM

## 2016-09-21 NOTE — Patient Instructions (Signed)
Steps to Quit Smoking Smoking tobacco can be bad for your health. It can also affect almost every organ in your body. Smoking puts you and people around you at risk for many serious long-lasting (chronic) diseases. Quitting smoking is hard, but it is one of the best things that you can do for your health. It is never too late to quit. What are the benefits of quitting smoking? When you quit smoking, you lower your risk for getting serious diseases and conditions. They can include:  Lung cancer or lung disease.  Heart disease.  Stroke.  Heart attack.  Not being able to have children (infertility).  Weak bones (osteoporosis) and broken bones (fractures). If you have coughing, wheezing, and shortness of breath, those symptoms may get better when you quit. You may also get sick less often. If you are pregnant, quitting smoking can help to lower your chances of having a baby of low birth weight. What can I do to help me quit smoking? Talk with your doctor about what can help you quit smoking. Some things you can do (strategies) include:  Quitting smoking totally, instead of slowly cutting back how much you smoke over a period of time.  Going to in-person counseling. You are more likely to quit if you go to many counseling sessions.  Using resources and support systems, such as:  Online chats with a counselor.  Phone quitlines.  Printed self-help materials.  Support groups or group counseling.  Text messaging programs.  Mobile phone apps or applications.  Taking medicines. Some of these medicines may have nicotine in them. If you are pregnant or breastfeeding, do not take any medicines to quit smoking unless your doctor says it is okay. Talk with your doctor about counseling or other things that can help you. Talk with your doctor about using more than one strategy at the same time, such as taking medicines while you are also going to in-person counseling. This can help make quitting  easier. What things can I do to make it easier to quit? Quitting smoking might feel very hard at first, but there is a lot that you can do to make it easier. Take these steps:  Talk to your family and friends. Ask them to support and encourage you.  Call phone quitlines, reach out to support groups, or work with a counselor.  Ask people who smoke to not smoke around you.  Avoid places that make you want (trigger) to smoke, such as:  Bars.  Parties.  Smoke-break areas at work.  Spend time with people who do not smoke.  Lower the stress in your life. Stress can make you want to smoke. Try these things to help your stress:  Getting regular exercise.  Deep-breathing exercises.  Yoga.  Meditating.  Doing a body scan. To do this, close your eyes, focus on one area of your body at a time from head to toe, and notice which parts of your body are tense. Try to relax the muscles in those areas.  Download or buy apps on your mobile phone or tablet that can help you stick to your quit plan. There are many free apps, such as QuitGuide from the CDC (Centers for Disease Control and Prevention). You can find more support from smokefree.gov and other websites. This information is not intended to replace advice given to you by your health care provider. Make sure you discuss any questions you have with your health care provider. Document Released: 08/13/2009 Document Revised: 06/14/2016 Document   Reviewed: 03/03/2015 Elsevier Interactive Patient Education  2017 [employer]  

## 2016-09-29 NOTE — Telephone Encounter (Signed)
Does he need propofol or is he a regular case?

## 2016-09-29 NOTE — Telephone Encounter (Signed)
Propofol please

## 2016-09-29 NOTE — Telephone Encounter (Signed)
Let me know where we're at on scheduling this gentleman and if you need anything else from me.

## 2016-09-30 ENCOUNTER — Other Ambulatory Visit: Payer: Self-pay

## 2016-09-30 DIAGNOSIS — Z86010 Personal history of colonic polyps: Secondary | ICD-10-CM

## 2016-09-30 MED ORDER — PEG 3350-KCL-NA BICARB-NACL 420 G PO SOLR: 4000.0000 mL | ORAL | 0 refills | Status: DC

## 2016-09-30 NOTE — Telephone Encounter (Signed)
Pt is set up for TCS on 10/11/16 @ 815 am. He is aware and instructions are in the mail.

## 2016-10-03 NOTE — Patient Instructions (Signed)
PA for TCS: [id number]

## 2016-10-04 ENCOUNTER — Telehealth: Payer: Self-pay

## 2016-10-04 NOTE — Telephone Encounter (Signed)
Pt's wife called to let us know that he has pneumonia and would not be able to have the TCS. I told her to have him call when he is better to make another office visit.

## 2016-10-06 NOTE — Telephone Encounter (Signed)
Noted, no further recommendations. 

## 2016-10-07 ENCOUNTER — Inpatient Hospital Stay (HOSPITAL_COMMUNITY): Admission: RE | Admit: 2016-10-07 | Payer: Medicare Other | Source: Ambulatory Visit

## 2016-10-11 ENCOUNTER — Encounter (HOSPITAL_COMMUNITY): Admission: RE | Payer: Self-pay | Source: Ambulatory Visit

## 2016-10-11 ENCOUNTER — Ambulatory Visit (HOSPITAL_COMMUNITY): Admission: RE | Admit: 2016-10-11 | Payer: Medicare Other | Source: Ambulatory Visit | Admitting: Gastroenterology

## 2016-10-11 ENCOUNTER — Other Ambulatory Visit: Payer: Self-pay | Admitting: Cardiovascular Disease

## 2016-10-11 DIAGNOSIS — I739 Peripheral vascular disease, unspecified: Secondary | ICD-10-CM

## 2016-10-11 SURGERY — COLONOSCOPY WITH PROPOFOL

## 2016-10-17 ENCOUNTER — Telehealth: Payer: Self-pay | Admitting: Orthopaedic Surgery

## 2016-10-17 NOTE — Telephone Encounter (Signed)
Patient requests a refill on Hydrocodone/Acetaminophen (Norco)  7.5-325  Mgs.   Qty  110       Sig: Take 1 tablet by mouth every 6 (six) hours as needed for moderate pain (Must last 30 days.Do not drive or operate machinery while taking this medicine.).

## 2016-10-18 MED ORDER — HYDROCODONE-ACETAMINOPHEN 7.5-325 MG PO TABS: 1.0000 | ORAL_TABLET | Freq: Four times a day (QID) | ORAL | 0 refills | Status: DC | PRN

## 2016-10-20 ENCOUNTER — Inpatient Hospital Stay (HOSPITAL_COMMUNITY): Admission: RE | Admit: 2016-10-20 | Payer: Medicare Other | Source: Ambulatory Visit

## 2016-10-21 ENCOUNTER — Ambulatory Visit: Payer: Medicare Other | Admitting: Urology

## 2016-10-26 ENCOUNTER — Other Ambulatory Visit: Payer: Self-pay

## 2016-10-26 ENCOUNTER — Telehealth: Payer: Self-pay

## 2016-10-26 DIAGNOSIS — Z86010 Personal history of colonic polyps: Secondary | ICD-10-CM

## 2016-10-26 NOTE — Telephone Encounter (Signed)
Pt's significant other ([name]) called to reschedule TCS. TCS w/Propofol with [facility] rescheduled for 11/22/16 at 10:00 am. Pre-op appt 11/17/16 at 9:00 am. Instructions and letter mailed. Pt already has rx for prep. Orders entered.  Doris, can you please triage pt for upcoming TCS.

## 2016-10-26 NOTE — Telephone Encounter (Signed)
Opened in error

## 2016-11-02 ENCOUNTER — Telehealth: Payer: Self-pay

## 2016-11-02 NOTE — Telephone Encounter (Signed)
Gastroenterology Pre-Procedure Review  Request Date: 11/02/2016 Requesting Physician:   PATIENT REVIEW QUESTIONS: The patient responded to the following health history questions as indicated:    1. Diabetes Melitis: yes 2. Joint replacements in the past 12 months: no 3. Major health problems in the past 3 months: no 4. Has an artificial valve or MVP: no 5. Has a defibrillator: no 6. Has been advised in past to take antibiotics in advance of a procedure like teeth cleaning: no 7. Family history of colon cancer: no  8. Alcohol PPJ:KDTO OCCASIONALLY NOW 9. History of sleep apnea: no  10. History of coronary artery or other vascular stents placed within the last 12 months: yes Right leg  June 2017.  ( he had coronary stent in 12/2014 and right and left leg stents in 2014. )    MEDICATIONS & ALLERGIES:    Patient reports the following regarding taking any blood thinners:   Plavix? YES Aspirin? YES Coumadin? NO Brilinta? no Xarelto? no Eliquis? no Pradaxa? no Savaysa? no Effient? no  Patient confirms/reports the following medications:  Current Outpatient Prescriptions  Medication Sig Dispense Refill  . albuterol (PROVENTIL HFA;VENTOLIN HFA) 108 (90 Base) MCG/ACT inhaler Inhale 1-2 puffs into the lungs every 6 (six) hours as needed for wheezing or shortness of breath.    [name] aspirin 81 MG chewable tablet Chew 81 mg by mouth daily.    [name] atorvastatin (LIPITOR) 80 MG tablet Take 80 mg by mouth at bedtime.     . clopidogrel (PLAVIX) 75 MG tablet Take 75 mg by mouth daily.    [name] co-enzyme Q-10 30 MG capsule Take 30 mg by mouth 3 (three) times daily.    . fluticasone (FLONASE) 50 MCG/ACT nasal spray Place 1 spray into both nostrils daily.    [name] HYDROcodone-acetaminophen (NORCO) 7.5-325 MG tablet Take 1 tablet by mouth every 6 (six) hours as needed for moderate pain (Must last 30 days.Do not drive or operate machinery while taking this medicine.). 100 tablet 0  . ibuprofen (ADVIL,MOTRIN) 200 MG  tablet Take 600 mg by mouth every 8 (eight) hours as needed (for pain.).    [name] ipratropium-albuterol (DUONEB) 0.5-2.5 (3) MG/3ML SOLN Take 3 mLs by nebulization every 6 (six) hours as needed (for shortness of breath).    . metFORMIN (GLUCOPHAGE) 500 MG tablet Take 500 mg by mouth 2 (two) times daily.     . metoprolol succinate (TOPROL-XL) 50 MG 24 hr tablet Take 50 mg by mouth at bedtime.     . Omega-3 Fatty Acids (FISH OIL PO) Take 2 capsules by mouth 2 (two) times daily.     . pantoprazole (PROTONIX) 40 MG tablet Take 2 tablets (80 mg total) by mouth daily. (Patient taking differently: Take 40 mg by mouth 2 (two) times daily. ) 60 tablet 1  . Calcium-Magnesium 250-125 MG TABS Take 1 tablet by mouth at bedtime.     . polyethylene glycol-electrolytes (TRILYTE) 420 g solution Take 4,000 mLs by mouth as directed. 4000 mL 0  . Specialty Vitamins Products (MAGNESIUM, AMINO ACID CHELATE,) 133 MG tablet Take 1 tablet by mouth at bedtime.      No current facility-administered medications for this visit.     Patient confirms/reports the following allergies:  Allergies  Allergen Reactions  . Lisinopril Anaphylaxis  . Penicillins Anaphylaxis    Has patient had a PCN reaction causing immediate rash, facial/tongue/throat swelling, SOB or lightheadedness with hypotension:Yes Has patient had a PCN reaction causing severe rash involving mucus membranes  or skin necrosis:unsure Has patient had a PCN reaction that required hospitalization:unsure Has patient had a PCN reaction occurring within the last 10 years:~10 years per patient If all of the above answers are "NO", then may proceed with Cephalosporin use.   . Ace Inhibitors Swelling    No orders of the defined types were placed in this encounter.   AUTHORIZATION INFORMATION Primary Insurance:  ID #:   Group #:  Pre-Cert / Auth required:  Pre-Cert / Auth #:   Secondary Insurance:   ID #:   Group #:  Pre-Cert / Auth required:  Pre-Cert / Auth #:    SCHEDULE INFORMATION: Procedure has been scheduled as follows:  Date: Time:   Location:   This Gastroenterology Pre-Precedure Review Form is being routed to the following provider(s): Barney Drain, MD

## 2016-11-02 NOTE — Telephone Encounter (Signed)
See triage dated 11/02/2016.

## 2016-11-02 NOTE — Telephone Encounter (Signed)
MOVI PREP SPLIT DOSING, FULL LIQUIDS WITH BREAKFAST.  CONTINUE PLAVIX AND GLUCOPHAGE.  Full Liquid Diet A high-calorie, high-protein supplement should be used to meet your nutritional requirements when the full liquid diet is continued for more than 2 or 3 days. If this diet is to be used for an extended period of time (more than 7 days), a multivitamin should be considered.  Breads and Starches  Allowed: None are allowed   Avoid: Any others.    Potatoes/Pasta/Rice  Allowed: ANY ITEM AS A SOUP OR SMALL PLATE OF MASHED POTATOES OR SCRAMBLED EGGS. (DO NOT EAT MORE THAN ONE SERVING ON THE DAY BEFORE COLONOSCOPY).    Vegetables  Allowed: Strained tomato or vegetable juice. Vegetables pureed in soup.   Avoid: Any others.    Fruit  Allowed: Any strained fruit juices and fruit drinks. Include 1 serving of citrus or vitamin C-enriched fruit juice daily.   Avoid: Any others.  Meat and Meat Substitutes  Allowed: Egg  Avoid: Any meat, fish, or fowl. All cheese.  Milk  Allowed: SOY Milk beverages, including milk shakes and instant breakfast mixes. Smooth yogurt.   Avoid: Any others. Avoid dairy products if not tolerated.    Soups and Combination Foods  Allowed: Broth, strained cream soups. Strained, broth-based soups.   Avoid: Any others.    Desserts and Sweets  Allowed: flavored gelatin, tapioca, ice cream, sherbet, smooth pudding, junket, fruit ices, frozen ice pops, pudding pops, frozen fudge pops, chocolate syrup. Sugar, honey, jelly, syrup.   Avoid: Any others.  Fats and Oils  Allowed: Margarine, butter, cream, sour cream, oils.   Avoid: Any others.  Beverages  Allowed: All.   Avoid: None.  Condiments  Allowed: Iodized salt, pepper, spices, flavorings. Cocoa powder.   Avoid: Any others.    SAMPLE MEAL PLAN Breakfast   cup orange juice.   1 OR 2 EGGS  1 cup milk.   1 cup beverage (coffee or tea).   Cream or sugar, if desired.     Midmorning Snack  2 SCRAMBLED OR HARD BOILED EGG   Lunch  1 cup cream soup.    cup fruit juice.   1 cup milk.    cup custard.   1 cup beverage (coffee or tea).   Cream or sugar, if desired.    Midafternoon Snack  1 cup milk shake.  Dinner  1 cup cream soup.    cup fruit juice.   1 cup MILK    cup pudding.   1 cup beverage (coffee or tea).   Cream or sugar, if desired.  Evening Snack  1 cup supplement.  To increase calories, add sugar, cream, butter, or margarine if possible. Nutritional supplements will also increase the total calories.

## 2016-11-03 NOTE — Telephone Encounter (Signed)
Forwarding to [place], who sent his instructions.

## 2016-11-03 NOTE — Telephone Encounter (Signed)
Called pt, he has already picked up his Tri-Lyte prep.

## 2016-11-03 NOTE — Telephone Encounter (Signed)
Pt's insurance does not cover Movi Prep. Tri-Lyte has already been sent in and instructions mailed.

## 2016-11-09 ENCOUNTER — Ambulatory Visit: Payer: Medicare Other | Admitting: Orthopaedic Surgery

## 2016-11-15 ENCOUNTER — Encounter (HOSPITAL_COMMUNITY): Payer: Medicare Other

## 2016-11-15 NOTE — Patient Instructions (Signed)
Fred Hernandez Eng  11/15/2016     '@PREFPERIOPPHARMACY'$ @   Your procedure is scheduled on 11/22/2016.  Report to [facility] at 8:30 A.M.  Call this number if you have problems the morning of surgery:  [phone]   Remember:  Do not eat food or drink liquids after midnight.  Take these medicines the morning of surgery with A SIP OF WATER flonase, Hydrocodone, Duoneb, Metoprolol, Protonix   Do not wear jewelry, make-up or nail polish.  Do not wear lotions, powders, or perfumes, or deoderant.  Do not shave 48 hours prior to surgery.  Men may shave face and neck.  Do not bring valuables to the hospital.  [facility] is not responsible for any belongings or valuables.  Contacts, dentures or bridgework may not be worn into surgery.  Leave your suitcase in the car.  After surgery it may be brought to your room.  For patients admitted to the hospital, discharge time will be determined by your treatment team.  Patients discharged the day of surgery will not be allowed to drive home.    Please read over the following fact sheets that you were given. Anesthesia Post-op Instructions     PATIENT INSTRUCTIONS POST-ANESTHESIA  IMMEDIATELY FOLLOWING SURGERY:  Do not drive or operate machinery for the first twenty four hours after surgery.  Do not make any important decisions for twenty four hours after surgery or while taking narcotic pain medications or sedatives.  If you develop intractable nausea and vomiting or a severe headache please notify your doctor immediately.  FOLLOW-UP:  Please make an appointment with your surgeon as instructed. You do not need to follow up with anesthesia unless specifically instructed to do so.  WOUND CARE INSTRUCTIONS (if applicable):  Keep a dry clean dressing on the anesthesia/puncture wound site if there is drainage.  Once the wound has quit draining you may leave it open to air.  Generally you should leave the bandage intact for twenty four hours unless  there is drainage.  If the epidural site drains for more than 36-48 hours please call the anesthesia department.  QUESTIONS?:  Please feel free to call your physician or the hospital operator if you have any questions, and they will be happy to assist you.      Colonoscopy, Adult A colonoscopy is an exam to look at the entire large intestine. During the exam, a lubricated, bendable tube is inserted into the anus and then passed into the rectum, colon, and other parts of the large intestine. A colonoscopy is often done as a part of normal colorectal screening or in response to certain symptoms, such as anemia, persistent diarrhea, abdominal pain, and blood in the stool. The exam can help screen for and diagnose medical problems, including:  Tumors.  Polyps.  Inflammation.  Areas of bleeding. Tell a health care provider about:  Any allergies you have.  All medicines you are taking, including vitamins, herbs, eye drops, creams, and over-the-counter medicines.  Any problems you or family members have had with anesthetic medicines.  Any blood disorders you have.  Any surgeries you have had.  Any medical conditions you have.  Any problems you have had passing stool. What are the risks? Generally, this is a safe procedure. However, problems may occur, including:  Bleeding.  A tear in the intestine.  A reaction to medicines given during the exam.  Infection (rare). What happens before the procedure? Eating and drinking restrictions  Follow instructions from your health care  provider about eating and drinking, which may include:  A few days before the procedure - follow a low-fiber diet. Avoid nuts, seeds, dried fruit, raw fruits, and vegetables.  1-3 days before the procedure - follow a clear liquid diet. Drink only clear liquids, such as clear broth or bouillon, black coffee or tea, clear juice, clear soft drinks or sports drinks, gelatin desert, and popsicles. Avoid any  liquids that contain red or purple dye.  On the day of the procedure - do not eat or drink anything during the 2 hours before the procedure, or within the time period that your health care provider recommends. Bowel prep  If you were prescribed an oral bowel prep to clean out your colon:  Take it as told by your health care provider. Starting the day before your procedure, you will need to drink a large amount of medicated liquid. The liquid will cause you to have multiple loose stools until your stool is almost clear or light green.  If your skin or anus gets irritated from diarrhea, you may use these to relieve the irritation:  Medicated wipes, such as adult wet wipes with aloe and vitamin E.  A skin soothing-product like petroleum jelly.  If you vomit while drinking the bowel prep, take a break for up to 60 minutes and then begin the bowel prep again. If vomiting continues and you cannot take the bowel prep without vomiting, call your health care provider. General instructions  Ask your health care provider about changing or stopping your regular medicines. This is especially important if you are taking diabetes medicines or blood thinners.  Plan to have someone take you home from the hospital or clinic. What happens during the procedure?  An IV tube may be inserted into one of your veins.  You will be given medicine to help you relax (sedative).  To reduce your risk of infection:  Your health care team will wash or sanitize their hands.  Your anal area will be washed with soap.  You will be asked to lie on your side with your knees bent.  Your health care provider will lubricate a long, thin, flexible tube. The tube will have a camera and a light on the end.  The tube will be inserted into your anus.  The tube will be gently eased through your rectum and colon.  Air will be delivered into your colon to keep it open. You may feel some pressure or cramping.  The camera  will be used to take images during the procedure.  A small tissue sample may be removed from your body to be examined under a microscope (biopsy). If any potential problems are found, the tissue will be sent to a lab for testing.  If small polyps are found, your health care provider may remove them and have them checked for cancer cells.  The tube that was inserted into your anus will be slowly removed. The procedure may vary among health care providers and hospitals. What happens after the procedure?  Your blood pressure, heart rate, breathing rate, and blood oxygen level will be monitored until the medicines you were given have worn off.  Do not drive for 24 hours after the exam.  You may have a small amount of blood in your stool.  You may pass gas and have mild abdominal cramping or bloating due to the air that was used to inflate your colon during the exam.  It is up to you to get the  results of your procedure. Ask your health care provider, or the department performing the procedure, when your results will be ready. This information is not intended to replace advice given to you by your health care provider. Make sure you discuss any questions you have with your health care provider. Document Released: 10/14/2000 Document Revised: 05/06/2016 Document Reviewed: 12/29/2015 Elsevier Interactive Patient Education  2017 [employer].

## 2016-11-16 ENCOUNTER — Ambulatory Visit: Payer: Medicare Other | Admitting: Orthopaedic Surgery

## 2016-11-16 ENCOUNTER — Telehealth: Payer: Self-pay | Admitting: Orthopaedic Surgery

## 2016-11-16 NOTE — Telephone Encounter (Signed)
Patient requests refill on pain medication due to our office re-schedule of recent appointments on 1/10 and 11/16/16(weather) to appointment date of 11/22/16.  Please advise: HYDROcodone-acetaminophen (NORCO) 7.5-325 MG tablet 100 tablet   - insurance: [place] [employer] primary                      Medicaid secondary

## 2016-11-17 ENCOUNTER — Encounter (HOSPITAL_COMMUNITY)
Admission: RE | Admit: 2016-11-17 | Discharge: 2016-11-17 | Disposition: A | Discharge status: Home / Self Care | Payer: Medicare Other | Source: Ambulatory Visit | Attending: Gastroenterology | Admitting: Gastroenterology

## 2016-11-17 MED ORDER — HYDROCODONE-ACETAMINOPHEN 7.5-325 MG PO TABS: 1.0000 | ORAL_TABLET | Freq: Four times a day (QID) | ORAL | 0 refills | Status: DC | PRN

## 2016-11-21 ENCOUNTER — Encounter (HOSPITAL_COMMUNITY): Payer: Self-pay

## 2016-11-21 ENCOUNTER — Encounter (HOSPITAL_COMMUNITY)
Admission: RE | Admit: 2016-11-21 | Discharge: 2016-11-21 | Disposition: A | Discharge status: Home / Self Care | Payer: Medicare Other | Source: Ambulatory Visit | Attending: Gastroenterology | Admitting: Gastroenterology

## 2016-11-21 DIAGNOSIS — D124 Benign neoplasm of descending colon: Secondary | ICD-10-CM | POA: Diagnosis not present

## 2016-11-21 DIAGNOSIS — Z7984 Long term (current) use of oral hypoglycemic drugs: Secondary | ICD-10-CM | POA: Diagnosis not present

## 2016-11-21 DIAGNOSIS — Z1211 Encounter for screening for malignant neoplasm of colon: Secondary | ICD-10-CM | POA: Diagnosis not present

## 2016-11-21 DIAGNOSIS — Q438 Other specified congenital malformations of intestine: Secondary | ICD-10-CM | POA: Diagnosis not present

## 2016-11-21 DIAGNOSIS — Z79899 Other long term (current) drug therapy: Secondary | ICD-10-CM | POA: Diagnosis not present

## 2016-11-21 DIAGNOSIS — K644 Residual hemorrhoidal skin tags: Secondary | ICD-10-CM | POA: Diagnosis not present

## 2016-11-21 DIAGNOSIS — K648 Other hemorrhoids: Secondary | ICD-10-CM | POA: Diagnosis not present

## 2016-11-21 DIAGNOSIS — I2510 Atherosclerotic heart disease of native coronary artery without angina pectoris: Secondary | ICD-10-CM | POA: Diagnosis not present

## 2016-11-21 DIAGNOSIS — I10 Essential (primary) hypertension: Secondary | ICD-10-CM | POA: Diagnosis not present

## 2016-11-21 DIAGNOSIS — E785 Hyperlipidemia, unspecified: Secondary | ICD-10-CM | POA: Diagnosis not present

## 2016-11-21 DIAGNOSIS — D128 Benign neoplasm of rectum: Secondary | ICD-10-CM | POA: Diagnosis not present

## 2016-11-21 DIAGNOSIS — Z955 Presence of coronary angioplasty implant and graft: Secondary | ICD-10-CM | POA: Diagnosis not present

## 2016-11-21 DIAGNOSIS — Z86010 Personal history of colonic polyps: Secondary | ICD-10-CM | POA: Diagnosis not present

## 2016-11-21 DIAGNOSIS — Z7951 Long term (current) use of inhaled steroids: Secondary | ICD-10-CM | POA: Diagnosis not present

## 2016-11-21 DIAGNOSIS — K219 Gastro-esophageal reflux disease without esophagitis: Secondary | ICD-10-CM | POA: Diagnosis not present

## 2016-11-21 DIAGNOSIS — Z7902 Long term (current) use of antithrombotics/antiplatelets: Secondary | ICD-10-CM | POA: Diagnosis not present

## 2016-11-21 DIAGNOSIS — F17210 Nicotine dependence, cigarettes, uncomplicated: Secondary | ICD-10-CM | POA: Diagnosis not present

## 2016-11-21 DIAGNOSIS — Z7982 Long term (current) use of aspirin: Secondary | ICD-10-CM | POA: Diagnosis not present

## 2016-11-21 DIAGNOSIS — E1151 Type 2 diabetes mellitus with diabetic peripheral angiopathy without gangrene: Secondary | ICD-10-CM | POA: Diagnosis not present

## 2016-11-21 DIAGNOSIS — Z8546 Personal history of malignant neoplasm of prostate: Secondary | ICD-10-CM | POA: Diagnosis not present

## 2016-11-21 DIAGNOSIS — D123 Benign neoplasm of transverse colon: Secondary | ICD-10-CM | POA: Diagnosis not present

## 2016-11-21 DIAGNOSIS — Z95820 Peripheral vascular angioplasty status with implants and grafts: Secondary | ICD-10-CM | POA: Diagnosis not present

## 2016-11-21 LAB — BASIC METABOLIC PANEL
ANION GAP: 10 (ref 5–15)
BUN: 10 mg/dL (ref 6–20)
CALCIUM: 9.2 mg/dL (ref 8.9–10.3)
CO2: 22 mmol/L (ref 22–32)
CREATININE: 0.79 mg/dL (ref 0.61–1.24)
Chloride: 104 mmol/L (ref 101–111)
GFR calc Af Amer: >60 mL/min (ref >60)
GLUCOSE: 114 mg/dL — AB (ref 65–99)
Potassium: 4 mmol/L (ref 3.5–5.1)
Sodium: 136 mmol/L (ref 135–145)

## 2016-11-21 LAB — CBC WITH DIFFERENTIAL/PLATELET
BASOS ABS: 0.1 10*3/uL (ref 0.0–0.1)
BASOS PCT: 1 %
EOS ABS: 0.4 10*3/uL (ref 0.0–0.7)
EOS PCT: 3 %
HCT: 41.5 % (ref 39.0–52.0)
Hemoglobin: 13.9 g/dL (ref 13.0–17.0)
Lymphocytes Relative: 22 %
Lymphs Abs: 2.5 10*3/uL (ref 0.7–4.0)
MCH: 28.6 pg (ref 26.0–34.0)
MCHC: 33.5 g/dL (ref 30.0–36.0)
MCV: 85.4 fL (ref 78.0–100.0)
MONO ABS: 0.8 10*3/uL (ref 0.1–1.0)
Monocytes Relative: 7 %
Neutro Abs: 7.5 10*3/uL (ref 1.7–7.7)
Neutrophils Relative %: 67 %
PLATELETS: 467 10*3/uL — AB (ref 150–400)
RBC: 4.86 MIL/uL (ref 4.22–5.81)
RDW: 15.6 % — AB (ref 11.5–15.5)
WBC: 11.1 10*3/uL — ABNORMAL HIGH (ref 4.0–10.5)

## 2016-11-22 ENCOUNTER — Ambulatory Visit (HOSPITAL_COMMUNITY): Payer: Medicare Other | Admitting: Anesthesiology

## 2016-11-22 ENCOUNTER — Encounter (HOSPITAL_COMMUNITY): Payer: Self-pay | Admitting: *Deleted

## 2016-11-22 ENCOUNTER — Encounter (HOSPITAL_COMMUNITY)
Admission: RE | Disposition: A | Discharge status: Home / Self Care | Payer: Self-pay | Source: Ambulatory Visit | Attending: Gastroenterology

## 2016-11-22 ENCOUNTER — Ambulatory Visit (HOSPITAL_COMMUNITY)
Admission: RE | Admit: 2016-11-22 | Discharge: 2016-11-22 | Disposition: A | Discharge status: Home / Self Care | Payer: Medicare Other | Source: Ambulatory Visit | Attending: Gastroenterology | Admitting: Gastroenterology

## 2016-11-22 ENCOUNTER — Ambulatory Visit: Payer: Medicare Other | Admitting: Orthopaedic Surgery

## 2016-11-22 DIAGNOSIS — D124 Benign neoplasm of descending colon: Secondary | ICD-10-CM | POA: Insufficient documentation

## 2016-11-22 DIAGNOSIS — E1151 Type 2 diabetes mellitus with diabetic peripheral angiopathy without gangrene: Secondary | ICD-10-CM | POA: Insufficient documentation

## 2016-11-22 DIAGNOSIS — Z955 Presence of coronary angioplasty implant and graft: Secondary | ICD-10-CM | POA: Insufficient documentation

## 2016-11-22 DIAGNOSIS — Z8546 Personal history of malignant neoplasm of prostate: Secondary | ICD-10-CM | POA: Insufficient documentation

## 2016-11-22 DIAGNOSIS — K648 Other hemorrhoids: Secondary | ICD-10-CM | POA: Insufficient documentation

## 2016-11-22 DIAGNOSIS — Z1211 Encounter for screening for malignant neoplasm of colon: Secondary | ICD-10-CM | POA: Insufficient documentation

## 2016-11-22 DIAGNOSIS — K644 Residual hemorrhoidal skin tags: Secondary | ICD-10-CM | POA: Insufficient documentation

## 2016-11-22 DIAGNOSIS — Z95820 Peripheral vascular angioplasty status with implants and grafts: Secondary | ICD-10-CM | POA: Insufficient documentation

## 2016-11-22 DIAGNOSIS — Z86010 Personal history of colonic polyps: Secondary | ICD-10-CM | POA: Diagnosis not present

## 2016-11-22 DIAGNOSIS — D128 Benign neoplasm of rectum: Secondary | ICD-10-CM | POA: Diagnosis not present

## 2016-11-22 DIAGNOSIS — D123 Benign neoplasm of transverse colon: Secondary | ICD-10-CM | POA: Diagnosis not present

## 2016-11-22 DIAGNOSIS — Z7902 Long term (current) use of antithrombotics/antiplatelets: Secondary | ICD-10-CM | POA: Insufficient documentation

## 2016-11-22 DIAGNOSIS — K219 Gastro-esophageal reflux disease without esophagitis: Secondary | ICD-10-CM | POA: Insufficient documentation

## 2016-11-22 DIAGNOSIS — Z7984 Long term (current) use of oral hypoglycemic drugs: Secondary | ICD-10-CM | POA: Insufficient documentation

## 2016-11-22 DIAGNOSIS — I10 Essential (primary) hypertension: Secondary | ICD-10-CM | POA: Insufficient documentation

## 2016-11-22 DIAGNOSIS — I2510 Atherosclerotic heart disease of native coronary artery without angina pectoris: Secondary | ICD-10-CM | POA: Insufficient documentation

## 2016-11-22 DIAGNOSIS — Z7982 Long term (current) use of aspirin: Secondary | ICD-10-CM | POA: Insufficient documentation

## 2016-11-22 DIAGNOSIS — Z79899 Other long term (current) drug therapy: Secondary | ICD-10-CM | POA: Insufficient documentation

## 2016-11-22 DIAGNOSIS — Q438 Other specified congenital malformations of intestine: Secondary | ICD-10-CM | POA: Insufficient documentation

## 2016-11-22 DIAGNOSIS — D125 Benign neoplasm of sigmoid colon: Secondary | ICD-10-CM

## 2016-11-22 DIAGNOSIS — Z7951 Long term (current) use of inhaled steroids: Secondary | ICD-10-CM | POA: Insufficient documentation

## 2016-11-22 DIAGNOSIS — E785 Hyperlipidemia, unspecified: Secondary | ICD-10-CM | POA: Insufficient documentation

## 2016-11-22 DIAGNOSIS — F17210 Nicotine dependence, cigarettes, uncomplicated: Secondary | ICD-10-CM | POA: Insufficient documentation

## 2016-11-22 HISTORY — PX: POLYPECTOMY: SHX5525

## 2016-11-22 HISTORY — PX: COLONOSCOPY WITH PROPOFOL: SHX5780

## 2016-11-22 LAB — GLUCOSE, CAPILLARY
Glucose-Capillary: 109 mg/dL — ABNORMAL HIGH (ref 65–99)
Glucose-Capillary: 122 mg/dL — ABNORMAL HIGH (ref 65–99)

## 2016-11-22 SURGERY — COLONOSCOPY WITH PROPOFOL
Anesthesia: Monitor Anesthesia Care

## 2016-11-22 MED ORDER — PROPOFOL 500 MG/50ML IV EMUL
INTRAVENOUS | Status: DC | PRN
  Administered 2016-11-22: 09:00:00 via INTRAVENOUS
  Administered 2016-11-22: 125 ug/kg/min via INTRAVENOUS

## 2016-11-22 MED ORDER — MIDAZOLAM HCL 2 MG/2ML IJ SOLN
0.5000 mg | INTRAMUSCULAR | Status: DC | PRN
Start: 2016-11-22 — End: 2016-11-22
  Administered 2016-11-22: 2 mg via INTRAVENOUS

## 2016-11-22 MED ORDER — FENTANYL CITRATE (PF) 100 MCG/2ML IJ SOLN
INTRAMUSCULAR | Status: AC
  Filled 2016-11-22: qty 2

## 2016-11-22 MED ORDER — MIDAZOLAM HCL 2 MG/2ML IJ SOLN
INTRAMUSCULAR | Status: AC
  Filled 2016-11-22: qty 2

## 2016-11-22 MED ORDER — SIMETHICONE 40 MG/0.6ML PO SUSP
ORAL | Status: DC | PRN
  Administered 2016-11-22: 100 mL

## 2016-11-22 MED ORDER — LACTATED RINGERS IV SOLN
INTRAVENOUS | Status: DC
  Administered 2016-11-22: 08:00:00 via INTRAVENOUS

## 2016-11-22 MED ORDER — FENTANYL CITRATE (PF) 100 MCG/2ML IJ SOLN
25.0000 ug | INTRAMUSCULAR | Status: AC | PRN
  Administered 2016-11-22 (×2): 25 ug via INTRAVENOUS

## 2016-11-22 MED ORDER — PROPOFOL 10 MG/ML IV BOLUS
INTRAVENOUS | Status: AC
  Filled 2016-11-22: qty 40

## 2016-11-22 MED ORDER — MIDAZOLAM HCL 5 MG/5ML IJ SOLN
INTRAMUSCULAR | Status: DC | PRN
  Administered 2016-11-22: 2 mg via INTRAVENOUS

## 2016-11-22 NOTE — H&P (Signed)
Primary Care Physician:  Vesta Mixer Primary Gastroenterologist:  Dr. Oneida Alar  Pre-Procedure History & Physical: HPI:  Fred Hernandez is a 61 y.o. male here for  PERSONAL HISTORY OF POLYPS.  Past Medical History:  Diagnosis Date  . CAD (coronary artery disease)     s/p stent placement unknown artery 12/2014,  . Diabetes mellitus ([place])   . GERD (gastroesophageal reflux disease)   . History of prostate cancer   . Hyperlipemia   . Hypertension   . Peripheral arterial disease ([place])    a. h/o stents. b. 03/2016  - successful PTA and covered stenting using overlapping Viabahn covered stents of a long segment in-stent restenosis of previously placed nitinol self expanding stents in the proximal mid and distal right SFA.  . Tobacco abuse     Past Surgical History:  Procedure Laterality Date  . CORONARY STENT PLACEMENT  12-2014   [facility]  . FEMORAL ARTERY STENT Bilateral 03/2013  . PERIPHERAL VASCULAR CATHETERIZATION N/A 04/04/2016   Procedure: Lower Extremity Angiography;  Surgeon: Lorretta Harp, MD;  Location: [facility];  Service: Cardiovascular;  Laterality: N/A;  . PERIPHERAL VASCULAR CATHETERIZATION N/A 04/04/2016   Procedure: Abdominal Aortogram;  Surgeon: Lorretta Harp, MD;  Location: [facility];  Service: Cardiovascular;  Laterality: N/A;  . PERIPHERAL VASCULAR CATHETERIZATION  04/04/2016   Procedure: Peripheral Vascular Intervention;  Surgeon: Lorretta Harp, MD;  Location: [facility];  Service: Cardiovascular;;  rt SFA stent  . STENT PLACEMENT VASCULAR ([place] [facility])  2013    Prior to Admission medications   Medication Sig Start Date End Date Taking? Authorizing Provider  aspirin 81 MG chewable tablet Chew 81 mg by mouth daily.   Yes Historical Provider, MD  atorvastatin (LIPITOR) 80 MG tablet Take 80 mg by mouth at bedtime.    Yes Historical Provider, MD  CALCIUM-MAGNESIUM PO Take 1 tablet by mouth at bedtime.   Yes  Historical Provider, MD  clopidogrel (PLAVIX) 75 MG tablet Take 75 mg by mouth daily.   Yes Historical Provider, MD  co-enzyme Q-10 30 MG capsule Take 30 mg by mouth at bedtime.    Yes Historical Provider, MD  FLOVENT HFA 110 MCG/ACT inhaler Inhale 1 puff into the lungs 2 (two) times daily. 11/02/16  Yes Historical Provider, MD  fluticasone (FLONASE) 50 MCG/ACT nasal spray Place 1 spray into both nostrils daily.   Yes Historical Provider, MD  HYDROcodone-acetaminophen (NORCO) 7.5-325 MG tablet Take 1 tablet by mouth every 6 (six) hours as needed for moderate pain (Must last 30 days.Do not drive or operate machinery while taking this medicine.). 11/17/16  Yes Sanjuana Kava, MD  ibuprofen (ADVIL,MOTRIN) 200 MG tablet Take 600 mg by mouth every 8 (eight) hours as needed (for pain.).   Yes Historical Provider, MD  ipratropium-albuterol (DUONEB) 0.5-2.5 (3) MG/3ML SOLN Take 3 mLs by nebulization every 6 (six) hours as needed (for shortness of breath).   Yes Historical Provider, MD  metFORMIN (GLUCOPHAGE) 500 MG tablet Take 500 mg by mouth 2 (two) times daily.    Yes Historical Provider, MD  metoprolol succinate (TOPROL-XL) 50 MG 24 hr tablet Take 50 mg by mouth at bedtime.  02/08/16  Yes Historical Provider, MD  Omega-3 Fatty Acids (FISH OIL) 1000 MG CAPS Take 1 capsule by mouth 2 (two) times daily.    Yes Historical Provider, MD  pantoprazole (PROTONIX) 40 MG tablet Take 2 tablets (80 mg total) by mouth daily.  Patient taking differently: Take 40 mg by mouth 2 (two) times daily.  04/05/16  Yes Dayna N Dunn, PA-C    Allergies as of 10/26/2016 - Review Complete 10/04/2016  Allergen Reaction Noted  . Lisinopril Anaphylaxis 12/23/2015  . Penicillins Anaphylaxis 12/23/2015  . Ace inhibitors Swelling 12/23/2015    Family History  Problem Relation Age of Onset  . Diabetes Mother   . Heart disease Mother   . Hypertension Mother   . Healthy Brother   . Other Brother     accident   . Arthritis Sister   .  Arthritis Sister   . SIDS Brother   . Colon cancer Neg Hx     Social History   Social History  . Marital status: Single    Spouse name: N/A  . Number of children: N/A  . Years of education: N/A   Occupational History  . Not on file.   Social History Main Topics  . Smoking status: Current Every Day Smoker    Packs/day: 1.50    Types: Cigarettes    Start date: 02/12/1971  . Smokeless tobacco: Never Used  . Alcohol use 0.0 oz/week     Comment: Socially: beer or liquor about 3 times a week; occasional binge.  . Drug use: No  . Sexual activity: Yes   Other Topics Concern  . Not on file   Social History Narrative  . No narrative on file    Review of Systems: See HPI, otherwise negative ROS   Physical Exam: BP 134/89   Pulse 85   Temp 97.9 F (36.6 C) (Oral)   Resp (!) 21   SpO2 97%  General:   Alert,  pleasant and cooperative in NAD Head:  Normocephalic and atraumatic. Neck:  Supple; Lungs:  Clear throughout to auscultation.    Heart:  Regular rate and rhythm. Abdomen:  Soft, nontender and nondistended. Normal bowel sounds, without guarding, and without rebound.   Neurologic:  Alert and  oriented x4;  grossly normal neurologically.  Impression/Plan:    PERSONAL HISTORY OF POLYPS.  PLAN:  1. TCS TODAY. DISCUSSED PROCEDURE, BENEFITS, & RISKS: < 1% chance of medication reaction, bleeding, perforation, or rupture of spleen/liver.

## 2016-11-22 NOTE — Anesthesia Preprocedure Evaluation (Signed)
Anesthesia Evaluation  Patient identified by MRN, date of birth, ID band Patient awake    Reviewed: Allergy & Precautions, NPO status , Patient's Chart, lab work & pertinent test results  Airway Mallampati: I  TM Distance: >3 FB     Dental  (+) Edentulous Upper, Edentulous Lower   Pulmonary Current Smoker (am cough),    breath sounds clear to auscultation       Cardiovascular hypertension, (-) angina+ CAD, + Cardiac Stents and + Peripheral Vascular Disease ( FEMORAL ARTERY STENT)   Rhythm:Regular Rate:Normal     Neuro/Psych    GI/Hepatic GERD  Medicated and Controlled,  Endo/Other  diabetes, Type 2  Renal/GU      Musculoskeletal   Abdominal   Peds  Hematology   Anesthesia Other Findings   Reproductive/Obstetrics                             Anesthesia Physical Anesthesia Plan  ASA: III  Anesthesia Plan: MAC   Post-op Pain Management:    Induction: Intravenous  Airway Management Planned: Simple Face Mask  Additional Equipment:   Intra-op Plan:   Post-operative Plan:   Informed Consent: I have reviewed the patients History and Physical, chart, labs and discussed the procedure including the risks, benefits and alternatives for the proposed anesthesia with the patient or authorized representative who has indicated his/her understanding and acceptance.     Plan Discussed with:   Anesthesia Plan Comments:         Anesthesia Quick Evaluation

## 2016-11-22 NOTE — Anesthesia Postprocedure Evaluation (Signed)
Anesthesia Post Note  Patient: Fred Hernandez  Procedure(s) Performed: Procedure(s) (LRB): COLONOSCOPY WITH PROPOFOL (N/A) POLYPECTOMY  Patient location during evaluation: PACU Anesthesia Type: MAC Level of consciousness: patient cooperative Pain management: pain level controlled Vital Signs Assessment: post-procedure vital signs reviewed and stable Respiratory status: spontaneous breathing, nonlabored ventilation and respiratory function stable Cardiovascular status: blood pressure returned to baseline Postop Assessment: no signs of nausea or vomiting Anesthetic complications: no     Last Vitals:  Vitals:   11/22/16 0820 11/22/16 0825  BP: 115/75 116/73  Pulse:    Resp: 16 19  Temp:      Last Pain:  Vitals:   11/22/16 0741  TempSrc: Oral                 Ellery Tash J

## 2016-11-22 NOTE — Op Note (Signed)
[facility] Patient Name: Fred Hernandez Procedure Date: 11/22/2016 8:21 AM MRN: [id number] Date of Birth: February 21, 1956 Attending MD: Barney Drain , MD CSN: [id number] Age: 61 Admit Type: Outpatient Procedure:                Colonoscopy WITH COLD SNARE/HOT SNARE POLYPECTOMY Indications:              High risk colon cancer surveillance: Personal                            history of colonic polyps Providers:                Barney Drain, MD, Rosina Lowenstein, RN, Aram Candela Referring MD:             Lennon Alstrom. Baucom, MD Medicines:                Propofol per Anesthesia Complications:            No immediate complications. Estimated Blood Loss:     Estimated blood loss was minimal. Procedure:                Pre-Anesthesia Assessment:                           - Prior to the procedure, a History and Physical                            was performed, and patient medications and                            allergies were reviewed. The patient's tolerance of                            previous anesthesia was also reviewed. The risks                            and benefits of the procedure and the sedation                            options and risks were discussed with the patient.                            All questions were answered, and informed consent                            was obtained. Prior Anticoagulants: The patient has                            taken aspirin, last dose was day of procedure. ASA                            Grade Assessment: II - A patient with mild systemic                            disease. After reviewing the risks and benefits,  the patient was deemed in satisfactory condition to                            undergo the procedure. After obtaining informed                            consent, the colonoscope was passed under direct                            vision. Throughout the procedure, the patient's                            blood  pressure, pulse, and oxygen saturations were                            monitored continuously. The EC-[id number] ([id number])                            scope was introduced through the anus and advanced                            to the the cecum, identified by appendiceal orifice                            and ileocecal valve. The ileocecal valve,                            appendiceal orifice, and rectum were photographed.                            The patient tolerated the procedure well. The                            quality of the bowel preparation was good. The                            colonoscopy was somewhat difficult due to a                            tortuous colon. Successful completion of the                            procedure was aided by COLOWRAP. Scope In: 8:48:42 AM Scope Out: 9:07:25 AM Scope Withdrawal Time: 0 hours 14 minutes 45 seconds  Total Procedure Duration: 0 hours 18 minutes 43 seconds  Findings:      Three sessile and semi-pedunculated (RECTUM) polyps were found in the       rectum, mid descending colon and mid transverse colon. The polyps were 5       to 7 mm in size. These polyps were removed with a hot snare. Resection       and retrieval were complete.      A 4 mm polyp was found in the sigmoid colon. The polyp was sessile. The       polyp was removed  with a cold snare. Resection and retrieval were       complete.      The recto-sigmoid colon, sigmoid colon and descending colon were       moderately redundant.      External and internal hemorrhoids were found during retroflexion. The       hemorrhoids were moderate. Impression:               - Three 5 to 7 mm polyps in the rectum, in the mid                            descending colon and in the mid transverse colon,                            removed with a hot snare. Resected and retrieved.                           - One 4 mm polyp in the sigmoid colon, removed with                            a cold  snare. Resected and retrieved.                           - Redundant colon.                           - External and internal hemorrhoids. Moderate Sedation:      Per Anesthesia Care Recommendation:           - High fiber diet.                           - Continue present medications.                           - Await pathology results.                           - Repeat colonoscopy in 3 - 5 years for                            surveillance.                           - Patient has a contact number available for                            emergencies. The signs and symptoms of potential                            delayed complications were discussed with the                            patient. Return to normal activities tomorrow.                            Written discharge  instructions were provided to the                            patient. Procedure Code(s):        --- Professional ---                           [id number], Colonoscopy, flexible; with removal of                            tumor(s), polyp(s), or other lesion(s) by snare                            technique Diagnosis Code(s):        --- Professional ---                           Z86.010, Personal history of colonic polyps                           K62.1, Rectal polyp                           D12.4, Benign neoplasm of descending colon                           D12.3, Benign neoplasm of transverse colon (hepatic                            flexure or splenic flexure)                           D12.5, Benign neoplasm of sigmoid colon                           K64.8, Other hemorrhoids                           Q43.8, Other specified congenital malformations of                            intestine CPT copyright 2016 [employer]. All rights reserved. The codes documented in this report are preliminary and upon coder review may  be revised to meet current compliance requirements. Barney Drain, MD Barney Drain,  MD 11/22/2016 9:17:50 AM This report has been signed electronically. Number of Addenda: 0

## 2016-11-22 NOTE — Discharge Instructions (Addendum)
You had 4 polyp removed FROM YOUR COLON AND RECTUM. You have MODERATE SIZE internal hemorrhoids.   DRINK WATER TO KEEP YOUR URINE LIGHT YELLOW.  FOLLOW A HIGH FIBER DIET. AVOID ITEMS THAT CAUSE BLOATING & GAS. SEE INFO BELOW.  YOUR BIOPSY RESULTS WILL BE AVAILABLE IN MY CHART AFTER JAN 26 AND MY OFFICE WILL CONTACT YOU IN 10-14 DAYS WITH YOUR RESULTS.   Next colonoscopy in 3-5 years. YOUR SISTERS, BROTHERS, CHILDREN, AND PARENTS NEED TO HAVE A COLONOSCOPY STARTING AT THE AGE OF 40.   Colonoscopy Care After Read the instructions outlined below and refer to this sheet in the next week. These discharge instructions provide you with general information on caring for yourself after you leave the hospital. While your treatment has been planned according to the most current medical practices available, unavoidable complications occasionally occur. If you have any problems or questions after discharge, call DR. FIELDS, [phone].  ACTIVITY  You may resume your regular activity, but move at a slower pace for the next 24 hours.   Take frequent rest periods for the next 24 hours.   Walking will help get rid of the air and reduce the bloated feeling in your belly (abdomen).   No driving for 24 hours (because of the medicine (anesthesia) used during the test).   You may shower.   Do not sign any important legal documents or operate any machinery for 24 hours (because of the anesthesia used during the test).    NUTRITION  Drink plenty of fluids.   You may resume your normal diet as instructed by your doctor.   Begin with a light meal and progress to your normal diet. Heavy or fried foods are harder to digest and may make you feel sick to your stomach (nauseated).   Avoid alcoholic beverages for 24 hours or as instructed.    MEDICATIONS  You may resume your normal medications.   WHAT YOU CAN EXPECT TODAY  Some feelings of bloating in the abdomen.   Passage of more gas than  usual.   Spotting of blood in your stool or on the toilet paper  .  IF YOU HAD POLYPS REMOVED DURING THE COLONOSCOPY:  Eat a soft diet IF YOU HAVE NAUSEA, BLOATING, ABDOMINAL PAIN, OR VOMITING.    FINDING OUT THE RESULTS OF YOUR TEST Not all test results are available during your visit. DR. Oneida Alar WILL CALL YOU WITHIN 14 DAYS OF YOUR PROCEDUE WITH YOUR RESULTS. Do not assume everything is normal if you have not heard from DR. FIELDS, CALL HER OFFICE AT [phone].  SEEK IMMEDIATE MEDICAL ATTENTION AND CALL THE OFFICE: [phone] IF:  You have more than a spotting of blood in your stool.   Your belly is swollen (abdominal distention).   You are nauseated or vomiting.   You have a temperature over 101F.   You have abdominal pain or discomfort that is severe or gets worse throughout the day.   High-Fiber Diet A high-fiber diet changes your normal diet to include more whole grains, legumes, fruits, and vegetables. Changes in the diet involve replacing refined carbohydrates with unrefined foods. The calorie level of the diet is essentially unchanged. The Dietary Reference Intake (recommended amount) for adult males is 38 grams per day. For adult females, it is 25 grams per day. Pregnant and lactating women should consume 28 grams of fiber per day. Fiber is the intact part of a plant that is not broken down during digestion. Functional fiber is fiber that  has been isolated from the plant to provide a beneficial effect in the body. PURPOSE  Increase stool bulk.   Ease and regulate bowel movements.   Lower cholesterol.   REDUCE RISK OF COLON CANCER  INDICATIONS THAT YOU NEED MORE FIBER  Constipation and hemorrhoids.   Uncomplicated diverticulosis (intestine condition) and irritable bowel syndrome.   Weight management.   As a protective measure against hardening of the arteries (atherosclerosis), diabetes, and cancer.   GUIDELINES FOR INCREASING FIBER IN THE DIET  Start  adding fiber to the diet slowly. A gradual increase of about 5 more grams (2 slices of whole-wheat bread, 2 servings of most fruits or vegetables, or 1 bowl of high-fiber cereal) per day is best. Too rapid an increase in fiber may result in constipation, flatulence, and bloating.   Drink enough water and fluids to keep your urine clear or pale yellow. Water, juice, or caffeine-free drinks are recommended. Not drinking enough fluid may cause constipation.   Eat a variety of high-fiber foods rather than one type of fiber.   Try to increase your intake of fiber through using high-fiber foods rather than fiber pills or supplements that contain small amounts of fiber.   The goal is to change the types of food eaten. Do not supplement your present diet with high-fiber foods, but replace foods in your present diet.   INCLUDE A VARIETY OF FIBER SOURCES  Replace refined and processed grains with whole grains, canned fruits with fresh fruits, and incorporate other fiber sources. White rice, white breads, and most bakery goods contain little or no fiber.   Brown whole-grain rice, buckwheat oats, and many fruits and vegetables are all good sources of fiber. These include: broccoli, Brussels sprouts, cabbage, cauliflower, beets, sweet potatoes, white potatoes (skin on), carrots, tomatoes, eggplant, squash, berries, fresh fruits, and dried fruits.   Cereals appear to be the richest source of fiber. Cereal fiber is found in whole grains and bran. Bran is the fiber-rich outer coat of cereal grain, which is largely removed in refining. In whole-grain cereals, the bran remains. In breakfast cereals, the largest amount of fiber is found in those with "bran" in their names. The fiber content is sometimes indicated on the label.   You may need to include additional fruits and vegetables each day.   In baking, for 1 cup white flour, you may use the following substitutions:   1 cup whole-wheat flour minus 2  tablespoons.   1/2 cup white flour plus 1/2 cup whole-wheat flour.   Polyps, Colon  A polyp is extra tissue that grows inside your body. Colon polyps grow in the large intestine. The large intestine, also called the colon, is part of your digestive system. It is a long, hollow tube at the end of your digestive tract where your body makes and stores stool. Most polyps are not dangerous. They are benign. This means they are not cancerous. But over time, some types of polyps can turn into cancer. Polyps that are smaller than a pea are usually not harmful. But larger polyps could someday become or may already be cancerous. To be safe, doctors remove all polyps and test them.   PREVENTION There is not one sure way to prevent polyps. You might be able to lower your risk of getting them if you:  Eat more fruits and vegetables and less fatty food.   Do not smoke.   Avoid alcohol.   Exercise every day.   Lose weight if you are  overweight.   Eating more calcium and folate can also lower your risk of getting polyps. Some foods that are rich in calcium are milk, cheese, and broccoli. Some foods that are rich in folate are chickpeas, kidney beans, and spinach.   Hemorrhoids Hemorrhoids are dilated (enlarged) veins around the rectum. Sometimes clots will form in the veins. This makes them swollen and painful. These are called thrombosed hemorrhoids. Causes of hemorrhoids include:  Constipation.   Straining to have a bowel movement.   HEAVY LIFTING  HOME CARE INSTRUCTIONS  Eat a well balanced diet and drink 6 to 8 glasses of water every day to avoid constipation. You may also use a bulk laxative.   Avoid straining to have bowel movements.   Keep anal area dry and clean.   Do not use a donut shaped pillow or sit on the toilet for long periods. This increases blood pooling and pain.   Move your bowels when your body has the urge; this will require less straining and will decrease pain and  pressure.

## 2016-11-22 NOTE — Transfer of Care (Signed)
Immediate Anesthesia Transfer of Care Note  Patient: Fred Hernandez  Procedure(s) Performed: Procedure(s) with comments: COLONOSCOPY WITH PROPOFOL (N/A) - 10:00 am POLYPECTOMY - descending colon polyp, transverse colon polyp, sigmoid colon polyp, rectal polyp  Patient Location: PACU  Anesthesia Type:MAC  Level of Consciousness: sedated  Airway & Oxygen Therapy: Patient Spontanous Breathing and Patient connected to face mask oxygen  Post-op Assessment: Report given to RN, Post -op Vital signs reviewed and stable and Patient moving all extremities  Post vital signs: Reviewed and stable  Last Vitals:  Vitals:   11/22/16 0820 11/22/16 0825  BP: 115/75 116/73  Pulse:    Resp: 16 19  Temp:      Last Pain:  Vitals:   11/22/16 0741  TempSrc: Oral      Patients Stated Pain Goal: 8 (32/20/25 4270)  Complications: No apparent anesthesia complications

## 2016-11-23 ENCOUNTER — Encounter (HOSPITAL_COMMUNITY): Payer: Medicare Other

## 2016-11-24 ENCOUNTER — Ambulatory Visit (INDEPENDENT_AMBULATORY_CARE_PROVIDER_SITE_OTHER): Payer: Medicare Other | Admitting: Orthopaedic Surgery

## 2016-11-24 ENCOUNTER — Encounter (HOSPITAL_COMMUNITY): Payer: Self-pay | Admitting: Gastroenterology

## 2016-11-24 VITALS — BP 132/77 | HR 89 | Temp 98.1°F | Ht 68.0 in | Wt 176.0 lb

## 2016-11-24 DIAGNOSIS — G8929 Other chronic pain: Secondary | ICD-10-CM

## 2016-11-24 DIAGNOSIS — I10 Essential (primary) hypertension: Secondary | ICD-10-CM | POA: Diagnosis not present

## 2016-11-24 DIAGNOSIS — F17210 Nicotine dependence, cigarettes, uncomplicated: Secondary | ICD-10-CM | POA: Diagnosis not present

## 2016-11-24 DIAGNOSIS — M25512 Pain in left shoulder: Secondary | ICD-10-CM

## 2016-11-24 NOTE — Patient Instructions (Signed)
Steps to Quit Smoking Smoking tobacco can be bad for your health. It can also affect almost every organ in your body. Smoking puts you and people around you at risk for many serious long-lasting (chronic) diseases. Quitting smoking is hard, but it is one of the best things that you can do for your health. It is never too late to quit. What are the benefits of quitting smoking? When you quit smoking, you lower your risk for getting serious diseases and conditions. They can include:  Lung cancer or lung disease.  Heart disease.  Stroke.  Heart attack.  Not being able to have children (infertility).  Weak bones (osteoporosis) and broken bones (fractures). If you have coughing, wheezing, and shortness of breath, those symptoms may get better when you quit. You may also get sick less often. If you are pregnant, quitting smoking can help to lower your chances of having a baby of low birth weight. What can I do to help me quit smoking? Talk with your doctor about what can help you quit smoking. Some things you can do (strategies) include:  Quitting smoking totally, instead of slowly cutting back how much you smoke over a period of time.  Going to in-person counseling. You are more likely to quit if you go to many counseling sessions.  Using resources and support systems, such as:  Online chats with a counselor.  Phone quitlines.  Printed self-help materials.  Support groups or group counseling.  Text messaging programs.  Mobile phone apps or applications.  Taking medicines. Some of these medicines may have nicotine in them. If you are pregnant or breastfeeding, do not take any medicines to quit smoking unless your doctor says it is okay. Talk with your doctor about counseling or other things that can help you. Talk with your doctor about using more than one strategy at the same time, such as taking medicines while you are also going to in-person counseling. This can help make quitting  easier. What things can I do to make it easier to quit? Quitting smoking might feel very hard at first, but there is a lot that you can do to make it easier. Take these steps:  Talk to your family and friends. Ask them to support and encourage you.  Call phone quitlines, reach out to support groups, or work with a counselor.  Ask people who smoke to not smoke around you.  Avoid places that make you want (trigger) to smoke, such as:  Bars.  Parties.  Smoke-break areas at work.  Spend time with people who do not smoke.  Lower the stress in your life. Stress can make you want to smoke. Try these things to help your stress:  Getting regular exercise.  Deep-breathing exercises.  Yoga.  Meditating.  Doing a body scan. To do this, close your eyes, focus on one area of your body at a time from head to toe, and notice which parts of your body are tense. Try to relax the muscles in those areas.  Download or buy apps on your mobile phone or tablet that can help you stick to your quit plan. There are many free apps, such as QuitGuide from the CDC (Centers for Disease Control and Prevention). You can find more support from smokefree.gov and other websites. This information is not intended to replace advice given to you by your health care provider. Make sure you discuss any questions you have with your health care provider. Document Released: 08/13/2009 Document Revised: 06/14/2016 Document   Reviewed: 03/03/2015 Elsevier Interactive Patient Education  2017 [employer]  

## 2016-11-24 NOTE — Progress Notes (Signed)
Patient Fred Hernandez, male DOB:1956-02-01, 60 y.o. ATF:[id number]  Chief Complaint  Patient presents with  . Follow-up    chronic left shoulder pain    HPI  Fred Hernandez is a 61 y.o. male who has chronic left shoulder pain.  He has no paresthesia.  He has pain with overhead use.  He has been doing his exercises and taking his medicine. HPI  Body mass index is 26.76 kg/m.  ROS  Review of Systems  Constitutional:       Patient has Diabetes Mellitus. Patient has hypertension. Patient has COPD or shortness of breath. Patient does not have BMI > 35. Patient has current smoking history  HENT: Negative for congestion.   Cardiovascular: Negative for chest pain.  Endocrine: Positive for cold intolerance.  Musculoskeletal: Positive for arthralgias and myalgias.  Allergic/Immunologic: Positive for environmental allergies.  Neurological: Negative for numbness.    Past Medical History:  Diagnosis Date  . CAD (coronary artery disease)     s/p stent placement unknown artery 12/2014,  . Diabetes mellitus ([place])   . GERD (gastroesophageal reflux disease)   . History of prostate cancer   . Hyperlipemia   . Hypertension   . Peripheral arterial disease ([place])    a. h/o stents. b. 03/2016  - successful PTA and covered stenting using overlapping Viabahn covered stents of a long segment in-stent restenosis of previously placed nitinol self expanding stents in the proximal mid and distal right SFA.  . Tobacco abuse     Past Surgical History:  Procedure Laterality Date  . COLONOSCOPY WITH PROPOFOL N/A 11/22/2016   Procedure: COLONOSCOPY WITH PROPOFOL;  Surgeon: [name], MD;  [facility];  Service: Endoscopy;  Laterality: N/A;  10:00 am  . CORONARY STENT PLACEMENT  12-2014   [facility] [employer]  . FEMORAL ARTERY STENT Bilateral 03/2013  . PERIPHERAL VASCULAR CATHETERIZATION N/A 04/04/2016   Procedure: Lower Extremity Angiography;  Surgeon: Lorretta Harp, MD;   Location: [facility];  Service: Cardiovascular;  Laterality: N/A;  . PERIPHERAL VASCULAR CATHETERIZATION N/A 04/04/2016   Procedure: Abdominal Aortogram;  Surgeon: Lorretta Harp, MD;  Location: [facility];  Service: Cardiovascular;  Laterality: N/A;  . PERIPHERAL VASCULAR CATHETERIZATION  04/04/2016   Procedure: Peripheral Vascular Intervention;  Surgeon: Lorretta Harp, MD;  Location: [facility];  Service: Cardiovascular;;  rt SFA stent  . POLYPECTOMY  11/22/2016   Procedure: POLYPECTOMY;  Surgeon: [name], MD;  Location: [facility];  Service: Endoscopy;;  descending colon polyp, transverse colon polyp, sigmoid colon polyp, rectal polyp  . STENT PLACEMENT VASCULAR ([place] [facility])  2013    Family History  Problem Relation Age of Onset  . Diabetes Mother   . Heart disease Mother   . Hypertension Mother   . Healthy Brother   . Other Brother     accident   . Arthritis Sister   . Arthritis Sister   . SIDS Brother   . Colon cancer Neg Hx     Social History Social History  Substance Use Topics  . Smoking status: Current Every Day Smoker    Packs/day: 1.50    Types: Cigarettes    Start date: 02/12/1971  . Smokeless tobacco: Never Used  . Alcohol use 0.0 oz/week     Comment: Socially: beer or liquor about 3 times a week; occasional binge.    Allergies  Allergen Reactions  . Lisinopril Anaphylaxis  . Penicillins Anaphylaxis  Has patient had a PCN reaction causing immediate rash, facial/tongue/throat swelling, SOB or lightheadedness with hypotension:Yes Has patient had a PCN reaction causing severe rash involving mucus membranes or skin necrosis:unsure Has patient had a PCN reaction that required hospitalization:unsure Has patient had a PCN reaction occurring within the last 10 years:~10 years per patient If all of the above answers are "NO", then may proceed with Cephalosporin use.   . Ace Inhibitors Swelling    Current Outpatient Prescriptions   Medication Sig Dispense Refill  . aspirin 81 MG chewable tablet Chew 81 mg by mouth daily.    [name] atorvastatin (LIPITOR) 80 MG tablet Take 80 mg by mouth at bedtime.     [name] CALCIUM-MAGNESIUM PO Take 1 tablet by mouth at bedtime.    . clopidogrel (PLAVIX) 75 MG tablet Take 75 mg by mouth daily.    [name] co-enzyme Q-10 30 MG capsule Take 30 mg by mouth at bedtime.     [name] FLOVENT HFA 110 MCG/ACT inhaler Inhale 1 puff into the lungs 2 (two) times daily.  2  . fluticasone (FLONASE) 50 MCG/ACT nasal spray Place 1 spray into both nostrils daily.    [name] HYDROcodone-acetaminophen (NORCO) 7.5-325 MG tablet Take 1 tablet by mouth every 6 (six) hours as needed for moderate pain (Must last 30 days.Do not drive or operate machinery while taking this medicine.). 90 tablet 0  . ibuprofen (ADVIL,MOTRIN) 200 MG tablet Take 600 mg by mouth every 8 (eight) hours as needed (for pain.).    [name] ipratropium-albuterol (DUONEB) 0.5-2.5 (3) MG/3ML SOLN Take 3 mLs by nebulization every 6 (six) hours as needed (for shortness of breath).    . metFORMIN (GLUCOPHAGE) 500 MG tablet Take 500 mg by mouth 2 (two) times daily.     . metoprolol succinate (TOPROL-XL) 50 MG 24 hr tablet Take 50 mg by mouth at bedtime.     . Omega-3 Fatty Acids (FISH OIL) 1000 MG CAPS Take 1 capsule by mouth 2 (two) times daily.     . pantoprazole (PROTONIX) 40 MG tablet Take 2 tablets (80 mg total) by mouth daily. (Patient taking differently: Take 40 mg by mouth 2 (two) times daily. ) 60 tablet 1   No current facility-administered medications for this visit.      Physical Exam  Blood pressure 132/77, pulse 89, temperature 98.1 F (36.7 C), height '5\' 8"'$  (1.727 m), weight 176 lb (79.8 kg).  Constitutional: overall normal hygiene, normal nutrition, well developed, normal grooming, normal body habitus. Assistive device:none  Musculoskeletal: gait and station Limp none, muscle tone and strength are normal, no tremors or atrophy is present.  .   Neurological: coordination overall normal.  Deep tendon reflex/nerve stretch intact.  Sensation normal.  Cranial nerves II-XII intact.   Skin:   Normal overall no scars, lesions, ulcers or rashes. No psoriasis.  Psychiatric: Alert and oriented x 3.  Recent memory intact, remote memory unclear.  Normal mood and affect. Well groomed.  Good eye contact.  Cardiovascular: overall no swelling, no varicosities, no edema bilaterally, normal temperatures of the legs and arms, no clubbing, cyanosis and good capillary refill.  Lymphatic: palpation is normal.  Examination of left Upper Extremity is done.  Inspection:   Overall:  Elbow non-tender without crepitus or defects, forearm non-tender without crepitus or defects, wrist non-tender without crepitus or defects, hand non-tender.    Shoulder: with glenohumeral joint tenderness, without effusion.   Upper arm: without swelling and tenderness   Range of motion:   Overall:  Full range of motion of the elbow, full range of motion of wrist and full range of motion in fingers.   Shoulder:  left  170 degrees forward flexion; 165 degrees abduction; 40 degrees internal rotation, 40 degrees external rotation, 15 degrees extension, 40 degrees adduction.   Stability:   Overall:  Shoulder, elbow and wrist stable   Strength and Tone:   Overall full shoulder muscles strength, full upper arm strength and normal upper arm bulk and tone.   The patient has been educated about the nature of the problem(s) and counseled on treatment options.  The patient appeared to understand what I have discussed and is in agreement with it.  Encounter Diagnoses  Name Primary?  . Chronic left shoulder pain Yes  . Essential hypertension   . Cigarette nicotine dependence without complication     PLAN Call if any problems.  Precautions discussed.  Continue current medications.   Return to clinic 2 months   Electronically Signed Sanjuana Kava, MD 1/25/20181:35 PM

## 2016-11-25 ENCOUNTER — Telehealth: Payer: Self-pay | Admitting: Gastroenterology

## 2016-11-25 NOTE — Telephone Encounter (Signed)
Please call pt. HE had four simple adenomas removed. FOLLOW A HIGH FIBER DIET. Next TCS IN 3 YEARS.

## 2016-11-28 ENCOUNTER — Telehealth: Payer: Self-pay | Admitting: Gastroenterology

## 2016-11-28 NOTE — Telephone Encounter (Signed)
Tried to call and VM not set up. Mailing a letter to call for results.

## 2016-11-28 NOTE — Telephone Encounter (Signed)
Reminder in epic °

## 2016-11-28 NOTE — Telephone Encounter (Signed)
Spoke to Fred Hernandez and she is aware of the results ( I had tried to call earlier and could not leave a message). She said the pt just does not have an appetite since procedure. Before he was eating fine,. He is making himself eat now, but just does not want it. I told her that is not something we see following a procedure. He is not having any other problems at all since procedure. She asked if he should see PCP and I told her that would be a good idea. I told her Dr. Oneida Alar is not here today, but will be back tomorrow at the hospital and I will let her know and see what she advises.

## 2016-11-28 NOTE — Telephone Encounter (Signed)
[phone] PATIENT [facility] CALLED AND STATED THAT HE HAS HAD NOT APPETITE SINCE HIS PROCEDURE. WHAT CAN THEY DO AND IS THIS NORMAL?

## 2016-12-05 NOTE — Telephone Encounter (Signed)
Tried to call and VM not set up.  

## 2016-12-05 NOTE — Telephone Encounter (Signed)
PLEASE CALL PT. HE SHOULD SEE HIS PCP FOR HIS ANOREXIA.

## 2016-12-06 NOTE — Telephone Encounter (Signed)
Fred Hernandez is aware.

## 2016-12-14 ENCOUNTER — Ambulatory Visit (HOSPITAL_COMMUNITY)
Admission: RE | Admit: 2016-12-14 | Discharge: 2016-12-14 | Disposition: A | Discharge status: Home / Self Care | Payer: Medicare Other | Source: Ambulatory Visit | Attending: Cardiology | Admitting: Cardiology

## 2016-12-14 DIAGNOSIS — I10 Essential (primary) hypertension: Secondary | ICD-10-CM | POA: Diagnosis not present

## 2016-12-14 DIAGNOSIS — E785 Hyperlipidemia, unspecified: Secondary | ICD-10-CM | POA: Insufficient documentation

## 2016-12-14 DIAGNOSIS — Z87891 Personal history of nicotine dependence: Secondary | ICD-10-CM | POA: Diagnosis not present

## 2016-12-14 DIAGNOSIS — E1151 Type 2 diabetes mellitus with diabetic peripheral angiopathy without gangrene: Secondary | ICD-10-CM | POA: Insufficient documentation

## 2016-12-14 DIAGNOSIS — I739 Peripheral vascular disease, unspecified: Secondary | ICD-10-CM | POA: Diagnosis not present

## 2016-12-14 DIAGNOSIS — I70201 Unspecified atherosclerosis of native arteries of extremities, right leg: Secondary | ICD-10-CM | POA: Diagnosis not present

## 2016-12-14 DIAGNOSIS — Z95828 Presence of other vascular implants and grafts: Secondary | ICD-10-CM | POA: Insufficient documentation

## 2016-12-14 DIAGNOSIS — I2510 Atherosclerotic heart disease of native coronary artery without angina pectoris: Secondary | ICD-10-CM | POA: Diagnosis not present

## 2016-12-16 ENCOUNTER — Telehealth: Payer: Self-pay | Admitting: Orthopaedic Surgery

## 2016-12-16 NOTE — Telephone Encounter (Signed)
Hydrocodone-Acetaminophen  7.5/'325mg'$   Qty 90 Tablets

## 2016-12-20 MED ORDER — HYDROCODONE-ACETAMINOPHEN 7.5-325 MG PO TABS: 1.0000 | ORAL_TABLET | Freq: Four times a day (QID) | ORAL | 0 refills | Status: DC | PRN

## 2016-12-23 ENCOUNTER — Ambulatory Visit (INDEPENDENT_AMBULATORY_CARE_PROVIDER_SITE_OTHER): Payer: Medicare Other | Admitting: Cardiovascular Disease

## 2016-12-23 ENCOUNTER — Encounter: Payer: Self-pay | Admitting: Cardiovascular Disease

## 2016-12-23 VITALS — BP 143/83 | HR 88 | Ht 68.0 in | Wt 171.8 lb

## 2016-12-23 DIAGNOSIS — I739 Peripheral vascular disease, unspecified: Secondary | ICD-10-CM

## 2016-12-23 NOTE — Assessment & Plan Note (Signed)
History of peripheral arterial disease status post bilateral SFA stenting in [place]. Because of right lower extreme and claudication angiogram to him 04/04/16 revealing a patent left SFA stent and occluded right SFA stent. I was able to recanalize his right SFA using overlapping Viabahn  covered stents with an excellent result. This follow-up lower extremity Dopplers performed a week later revealed a right ABI 0.95. He developed recurrent claudication in November or December of last year and recent Dopplers performed 12/14/16 revealed a decline in his right ABI to 0.7 and an occluded right SFA. At this point, he is minimally symptomatic. His only option revascularization should that become necessary would be femoropopliteal bypass grafting.

## 2016-12-23 NOTE — Progress Notes (Signed)
12/23/2016 Fred Hernandez   Jul 25, 1956  [id number]  Primary Physician Antionette Fairy, PA-C Primary Cardiologist: Lorretta Harp MD Renae Gloss  HPI:  Mr Fred Hernandez is a 61 year old engaged African-American male with no children accompanied by his fiancs [name]. He was referred by Dr. Bronson Ing for evaluation of symptomatic PAD. I last saw him in the office 04/21/16.He has a history of 75 years of tobacco abuse smoking 1-1/2 packs per day In addition, he has treated hypertension, hyperlipidemia and diabetes. He did have a stent placed in his coronary arteries Daniel 2016 after positive stress test. He's had recurrent claudication over the last 6 months in his right lower extremity. Dopplers performed 03/10/16 revealed a right ABI of 0.67 with occluded right SFA. He presents today for angiography and potential intervention. I performed angiography on him 04/04/16 confirming occluded SFA from the origin down to the adductor canal with two-vessel runoff is able to revascularize him percutaneously with overlapping bye-bye and covered stents excellent clinical angiographic and ultrasonographic result. His follow-up Dopplers performed 04/13/16 revealed an improvement in his right ABI from 0.67 up to 9.95. He had recurrent discomfort in his right leg beginning late last year and follow-up Dopplers performed 12/14/16 revealed a decline in his right ABI 0.7 and an occluded right SFA.  Current Outpatient Prescriptions  Medication Sig Dispense Refill  . aspirin 81 MG chewable tablet Chew 81 mg by mouth daily.    [name] atorvastatin (LIPITOR) 80 MG tablet Take 80 mg by mouth at bedtime.     [name] CALCIUM-MAGNESIUM PO Take 1 tablet by mouth at bedtime.    . clopidogrel (PLAVIX) 75 MG tablet Take 75 mg by mouth daily.    [name] co-enzyme Q-10 30 MG capsule Take 30 mg by mouth at bedtime.     [name] FLOVENT HFA 110 MCG/ACT inhaler Inhale 1 puff into the lungs 2 (two) times daily.  2  . fluticasone (FLONASE) 50 MCG/ACT nasal  spray Place 1 spray into both nostrils daily.    [name] HYDROcodone-acetaminophen (NORCO) 7.5-325 MG tablet Take 1 tablet by mouth every 6 (six) hours as needed for moderate pain (Must last 30 days.Do not drive or operate machinery while taking this medicine.). 50 tablet 0  . ibuprofen (ADVIL,MOTRIN) 200 MG tablet Take 600 mg by mouth every 8 (eight) hours as needed (for pain.).    [name] ipratropium-albuterol (DUONEB) 0.5-2.5 (3) MG/3ML SOLN Take 3 mLs by nebulization every 6 (six) hours as needed (for shortness of breath).    . metFORMIN (GLUCOPHAGE) 500 MG tablet Take 500 mg by mouth 2 (two) times daily.     . metoprolol succinate (TOPROL-XL) 50 MG 24 hr tablet Take 50 mg by mouth at bedtime.     . Omega-3 Fatty Acids (FISH OIL) 1000 MG CAPS Take 1 capsule by mouth 2 (two) times daily.     . pantoprazole (PROTONIX) 40 MG tablet Take 2 tablets (80 mg total) by mouth daily. (Patient taking differently: Take 40 mg by mouth 2 (two) times daily. ) 60 tablet 1   No current facility-administered medications for this visit.     Allergies  Allergen Reactions  . Lisinopril Anaphylaxis  . Penicillins Anaphylaxis    Has patient had a PCN reaction causing immediate rash, facial/tongue/throat swelling, SOB or lightheadedness with hypotension:Yes Has patient had a PCN reaction causing severe rash involving mucus membranes or skin necrosis:unsure Has patient had a PCN reaction that required hospitalization:unsure Has patient had a PCN reaction  occurring within the last 10 years:~10 years per patient If all of the above answers are "NO", then may proceed with Cephalosporin use.   . Ace Inhibitors Swelling    Social History   Social History  . Marital status: Single    Spouse name: N/A  . Number of children: N/A  . Years of education: N/A   Occupational History  . Not on file.   Social History Main Topics  . Smoking status: Current Every Day Smoker    Packs/day: 1.50    Types: Cigarettes    Start  date: 02/12/1971  . Smokeless tobacco: Never Used  . Alcohol use 0.0 oz/week     Comment: Socially: beer or liquor about 3 times a week; occasional binge.  . Drug use: No  . Sexual activity: Yes   Other Topics Concern  . Not on file   Social History Narrative  . No narrative on file     Review of Systems: General: negative for chills, fever, night sweats or weight changes.  Cardiovascular: negative for chest pain, dyspnea on exertion, edema, orthopnea, palpitations, paroxysmal nocturnal dyspnea or shortness of breath Dermatological: negative for rash Respiratory: negative for cough or wheezing Urologic: negative for hematuria Abdominal: negative for nausea, vomiting, diarrhea, bright red blood per rectum, melena, or hematemesis Neurologic: negative for visual changes, syncope, or dizziness All other systems reviewed and are otherwise negative except as noted above.    Blood pressure (!) 143/83, pulse 88, height '5\' 8"'$  (1.727 m), weight 171 lb 12.8 oz (77.9 kg), SpO2 95 %.  General appearance: alert and no distress Neck: no adenopathy, no carotid bruit, no JVD, supple, symmetrical, trachea midline and thyroid not enlarged, symmetric, no tenderness/mass/nodules Lungs: clear to auscultation bilaterally Heart: regular rate and rhythm, S1, S2 normal, no murmur, click, rub or gallop Extremities: extremities normal, atraumatic, no cyanosis or edema  EKG sinus rhythm at 88 without ST or T-wave changes. I personally reviewed this EKG  ASSESSMENT AND PLAN:   Peripheral arterial disease ([place]) History of peripheral arterial disease status post bilateral SFA stenting in [place]. Because of right lower extreme and claudication angiogram to him 04/04/16 revealing a patent left SFA stent and occluded right SFA stent. I was able to recanalize his right SFA using overlapping Viabahn  covered stents with an excellent result. This follow-up lower extremity Dopplers performed a week later  revealed a right ABI 0.95. He developed recurrent claudication in November or December of last year and recent Dopplers performed 12/14/16 revealed a decline in his right ABI to 0.7 and an occluded right SFA. At this point, he is minimally symptomatic. His only option revascularization should that become necessary would be femoropopliteal bypass grafting.      Lorretta Harp MD FACP,FACC,FAHA, [facility] 12/23/2016 12:11 PM

## 2016-12-23 NOTE — Patient Instructions (Signed)
Medication Instructions: Your physician recommends that you continue on your current medications as directed. Please refer to the Current Medication list given to you today.   Testing/Procedures: Your physician has requested that you have a lower extremity arterial duplex. During this test, ultrasound is used to evaluate arterial blood flow in the legs. Allow one hour for this exam. There are no restrictions or special instructions.  Your physician has requested that you have an ankle brachial index (ABI). During this test an ultrasound and blood pressure cuff are used to evaluate the arteries that supply the arms and legs with blood. Allow thirty minutes for this exam. There are no restrictions or special instructions. (In 1 year)  Follow-Up: Your physician wants you to follow-up in: 1 year with Dr. Gwenlyn Found. You will receive a reminder letter in the mail two months in advance. If you don't receive a letter, please call our office to schedule the follow-up appointment.  If you need a refill on your cardiac medications before your next appointment, please call your pharmacy.

## 2016-12-26 NOTE — Addendum Note (Signed)
Addended by: Ulice Brilliant T on: 12/26/2016 01:58 PM   Modules accepted: Orders

## 2017-01-17 ENCOUNTER — Telehealth: Payer: Self-pay | Admitting: Orthopaedic Surgery

## 2017-01-17 MED ORDER — HYDROCODONE-ACETAMINOPHEN 7.5-325 MG PO TABS: 1.0000 | ORAL_TABLET | Freq: Four times a day (QID) | ORAL | 0 refills | Status: DC | PRN

## 2017-01-17 NOTE — Telephone Encounter (Signed)
Patient requests a refill on Hydrocodone/Acetaminophen 7.5-325  mgs.   Qty 50       Sig: Take 1 tablet by mouth every 6 (six) hours as needed for moderate pain (Must last 30 days.Do not drive or operate machinery while taking this medicine.).

## 2017-01-24 ENCOUNTER — Ambulatory Visit (INDEPENDENT_AMBULATORY_CARE_PROVIDER_SITE_OTHER): Payer: Medicare Other | Admitting: Orthopaedic Surgery

## 2017-01-24 ENCOUNTER — Encounter: Payer: Self-pay | Admitting: Orthopaedic Surgery

## 2017-01-24 VITALS — BP 131/81 | HR 99 | Temp 97.3°F | Ht 68.0 in | Wt 172.0 lb

## 2017-01-24 DIAGNOSIS — M25512 Pain in left shoulder: Secondary | ICD-10-CM | POA: Diagnosis not present

## 2017-01-24 DIAGNOSIS — G8929 Other chronic pain: Secondary | ICD-10-CM | POA: Diagnosis not present

## 2017-01-24 DIAGNOSIS — F17210 Nicotine dependence, cigarettes, uncomplicated: Secondary | ICD-10-CM | POA: Diagnosis not present

## 2017-01-24 DIAGNOSIS — I10 Essential (primary) hypertension: Secondary | ICD-10-CM

## 2017-01-24 NOTE — Progress Notes (Signed)
Patient Fred Hernandez, male DOB:12/18/1955, 61 y.o. ZJQ:[id number]  Chief Complaint  Patient presents with  . Follow-up    shoulder pain    HPI  Fred Hernandez is a 61 y.o. male who has chronic pain of the left shoulder.  He has been doing his exercises and taking his medicine.  He has no new trauma, no redness, no numbness.  He has less pain now.  He continues to smoke and is not willing to stop. HPI  Body mass index is 26.15 kg/m.  ROS  Review of Systems  Constitutional:       Patient has Diabetes Mellitus. Patient has hypertension. Patient has COPD or shortness of breath. Patient does not have BMI > 35. Patient has current smoking history  HENT: Negative for congestion.   Cardiovascular: Negative for chest pain.  Endocrine: Positive for cold intolerance.  Musculoskeletal: Positive for arthralgias and myalgias.  Allergic/Immunologic: Positive for environmental allergies.  Neurological: Negative for numbness.    Past Medical History:  Diagnosis Date  . CAD (coronary artery disease)     s/p stent placement unknown artery 12/2014,  . Diabetes mellitus ([place])   . GERD (gastroesophageal reflux disease)   . History of prostate cancer   . Hyperlipemia   . Hypertension   . Peripheral arterial disease ([place])    a. h/o stents. b. 03/2016  - successful PTA and covered stenting using overlapping Viabahn covered stents of a long segment in-stent restenosis of previously placed nitinol self expanding stents in the proximal mid and distal right SFA.  . Tobacco abuse     Past Surgical History:  Procedure Laterality Date  . COLONOSCOPY WITH PROPOFOL N/A 11/22/2016   Procedure: COLONOSCOPY WITH PROPOFOL;  Surgeon: [name], MD;  [facility];  Service: Endoscopy;  Laterality: N/A;  10:00 am  . CORONARY STENT PLACEMENT  12-2014   [facility] [employer]  . FEMORAL ARTERY STENT Bilateral 03/2013  . PERIPHERAL VASCULAR CATHETERIZATION N/A 04/04/2016    Procedure: Lower Extremity Angiography;  Surgeon: Lorretta Harp, MD;  Location: [facility];  Service: Cardiovascular;  Laterality: N/A;  . PERIPHERAL VASCULAR CATHETERIZATION N/A 04/04/2016   Procedure: Abdominal Aortogram;  Surgeon: Lorretta Harp, MD;  Location: [place];  Service: Cardiovascular;  Laterality: N/A;  . PERIPHERAL VASCULAR CATHETERIZATION  04/04/2016   Procedure: Peripheral Vascular Intervention;  Surgeon: Lorretta Harp, MD;  Location: [place];  Service: Cardiovascular;;  rt SFA stent  . POLYPECTOMY  11/22/2016   Procedure: POLYPECTOMY;  Surgeon: [name], MD;  [facility];  Service: Endoscopy;;  descending colon polyp, transverse colon polyp, sigmoid colon polyp, rectal polyp  . STENT PLACEMENT VASCULAR ([place] [facility])  2013    Family History  Problem Relation Age of Onset  . Diabetes Mother   . Heart disease Mother   . Hypertension Mother   . Healthy Brother   . Other Brother     accident   . Arthritis Sister   . Arthritis Sister   . SIDS Brother   . Colon cancer Neg Hx     Social History Social History  Substance Use Topics  . Smoking status: Current Every Day Smoker    Packs/day: 1.50    Types: Cigarettes    Start date: 02/12/1971  . Smokeless tobacco: Never Used  . Alcohol use 0.0 oz/week     Comment: Socially: beer or liquor about 3 times a week; occasional binge.  Allergies  Allergen Reactions  . Lisinopril Anaphylaxis  . Penicillins Anaphylaxis    Has patient had a PCN reaction causing immediate rash, facial/tongue/throat swelling, SOB or lightheadedness with hypotension:Yes Has patient had a PCN reaction causing severe rash involving mucus membranes or skin necrosis:unsure Has patient had a PCN reaction that required hospitalization:unsure Has patient had a PCN reaction occurring within the last 10 years:~10 years per patient If all of the above answers are "NO", then may proceed with Cephalosporin  use.   . Ace Inhibitors Swelling    Current Outpatient Prescriptions  Medication Sig Dispense Refill  . aspirin 81 MG chewable tablet Chew 81 mg by mouth daily.    [name] atorvastatin (LIPITOR) 80 MG tablet Take 80 mg by mouth at bedtime.     [name] CALCIUM-MAGNESIUM PO Take 1 tablet by mouth at bedtime.    . clopidogrel (PLAVIX) 75 MG tablet Take 75 mg by mouth daily.    [name] co-enzyme Q-10 30 MG capsule Take 30 mg by mouth at bedtime.     [name] FLOVENT HFA 110 MCG/ACT inhaler Inhale 1 puff into the lungs 2 (two) times daily.  2  . fluticasone (FLONASE) 50 MCG/ACT nasal spray Place 1 spray into both nostrils daily.    [name] HYDROcodone-acetaminophen (NORCO) 7.5-325 MG tablet Take 1 tablet by mouth every 6 (six) hours as needed for moderate pain (Must last 30 days.Do not drive or operate machinery while taking this medicine.). 30 tablet 0  . ibuprofen (ADVIL,MOTRIN) 200 MG tablet Take 600 mg by mouth every 8 (eight) hours as needed (for pain.).    [name] ipratropium-albuterol (DUONEB) 0.5-2.5 (3) MG/3ML SOLN Take 3 mLs by nebulization every 6 (six) hours as needed (for shortness of breath).    . metFORMIN (GLUCOPHAGE) 500 MG tablet Take 500 mg by mouth 2 (two) times daily.     . metoprolol succinate (TOPROL-XL) 50 MG 24 hr tablet Take 50 mg by mouth at bedtime.     . Omega-3 Fatty Acids (FISH OIL) 1000 MG CAPS Take 1 capsule by mouth 2 (two) times daily.     . pantoprazole (PROTONIX) 40 MG tablet Take 2 tablets (80 mg total) by mouth daily. (Patient taking differently: Take 40 mg by mouth 2 (two) times daily. ) 60 tablet 1   No current facility-administered medications for this visit.      Physical Exam  Blood pressure 131/81, pulse 99, temperature 97.3 F (36.3 C), height '5\' 8"'$  (1.727 m), weight 172 lb (78 kg).  Constitutional: overall normal hygiene, normal nutrition, well developed, normal grooming, normal body habitus. Assistive device:none  Musculoskeletal: gait and station Limp none, muscle tone and  strength are normal, no tremors or atrophy is present.  .  Neurological: coordination overall normal.  Deep tendon reflex/nerve stretch intact.  Sensation normal.  Cranial nerves II-XII intact.   Skin:   Normal overall no scars, lesions, ulcers or rashes. No psoriasis.  Psychiatric: Alert and oriented x 3.  Recent memory intact, remote memory unclear.  Normal mood and affect. Well groomed.  Good eye contact.  Cardiovascular: overall no swelling, no varicosities, no edema bilaterally, normal temperatures of the legs and arms, no clubbing, cyanosis and good capillary refill.  Lymphatic: palpation is normal.  Examination of left Upper Extremity is done.  Inspection:   Overall:  Elbow non-tender without crepitus or defects, forearm non-tender without crepitus or defects, wrist non-tender without crepitus or defects, hand non-tender.    Shoulder: with glenohumeral joint tenderness, without effusion.  Upper arm: without swelling and tenderness   Range of motion:   Overall:  Full range of motion of the elbow, full range of motion of wrist and full range of motion in fingers.   Shoulder:  left  180 degrees forward flexion; 180 degrees abduction; 40 degrees internal rotation, 40 degrees external rotation, 20 degrees extension, 40 degrees adduction.   Stability:   Overall:  Shoulder, elbow and wrist stable   Strength and Tone:   Overall full shoulder muscles strength, full upper arm strength and normal upper arm bulk and tone.   The patient has been educated about the nature of the problem(s) and counseled on treatment options.  The patient appeared to understand what I have discussed and is in agreement with it.  Encounter Diagnoses  Name Primary?  . Chronic left shoulder pain Yes  . Essential hypertension   . Cigarette nicotine dependence without complication     PLAN Call if any problems.  Precautions discussed.  Continue current medications.   Return to clinic 3  months   Electronically Signed Sanjuana Kava, MD 3/27/20181:52 PM

## 2017-01-24 NOTE — Patient Instructions (Signed)
Steps to Quit Smoking Smoking tobacco can be bad for your health. It can also affect almost every organ in your body. Smoking puts you and people around you at risk for many serious Vaneta Hammontree-lasting (chronic) diseases. Quitting smoking is hard, but it is one of the best things that you can do for your health. It is never too late to quit. What are the benefits of quitting smoking? When you quit smoking, you lower your risk for getting serious diseases and conditions. They can include:  Lung cancer or lung disease.  Heart disease.  Stroke.  Heart attack.  Not being able to have children (infertility).  Weak bones (osteoporosis) and broken bones (fractures). If you have coughing, wheezing, and shortness of breath, those symptoms may get better when you quit. You may also get sick less often. If you are pregnant, quitting smoking can help to lower your chances of having a baby of low birth weight. What can I do to help me quit smoking? Talk with your doctor about what can help you quit smoking. Some things you can do (strategies) include:  Quitting smoking totally, instead of slowly cutting back how much you smoke over a period of time.  Going to in-person counseling. You are more likely to quit if you go to many counseling sessions.  Using resources and support systems, such as:  Online chats with a counselor.  Phone quitlines.  Printed self-help materials.  Support groups or group counseling.  Text messaging programs.  Mobile phone apps or applications.  Taking medicines. Some of these medicines may have nicotine in them. If you are pregnant or breastfeeding, do not take any medicines to quit smoking unless your doctor says it is okay. Talk with your doctor about counseling or other things that can help you. Talk with your doctor about using more than one strategy at the same time, such as taking medicines while you are also going to in-person counseling. This can help make quitting  easier. What things can I do to make it easier to quit? Quitting smoking might feel very hard at first, but there is a lot that you can do to make it easier. Take these steps:  Talk to your family and friends. Ask them to support and encourage you.  Call phone quitlines, reach out to support groups, or work with a counselor.  Ask people who smoke to not smoke around you.  Avoid places that make you want (trigger) to smoke, such as:  Bars.  Parties.  Smoke-break areas at work.  Spend time with people who do not smoke.  Lower the stress in your life. Stress can make you want to smoke. Try these things to help your stress:  Getting regular exercise.  Deep-breathing exercises.  Yoga.  Meditating.  Doing a body scan. To do this, close your eyes, focus on one area of your body at a time from head to toe, and notice which parts of your body are tense. Try to relax the muscles in those areas.  Download or buy apps on your mobile phone or tablet that can help you stick to your quit plan. There are many free apps, such as QuitGuide from the CDC (Centers for Disease Control and Prevention). You can find more support from smokefree.gov and other websites. This information is not intended to replace advice given to you by your health care provider. Make sure you discuss any questions you have with your health care provider. Document Released: 08/13/2009 Document Revised: 06/14/2016 Document   Reviewed: 03/03/2015 Elsevier Interactive Patient Education  2017 Elsevier Inc.  

## 2017-02-15 ENCOUNTER — Telehealth: Payer: Self-pay | Admitting: Orthopaedic Surgery

## 2017-02-15 MED ORDER — HYDROCODONE-ACETAMINOPHEN 7.5-325 MG PO TABS: 1.0000 | ORAL_TABLET | Freq: Four times a day (QID) | ORAL | 0 refills | Status: DC | PRN

## 2017-02-15 NOTE — Telephone Encounter (Signed)
Hydrocodone-Acetatminophen  7.5/325 mg  Qty 30 Tablets

## 2017-03-01 ENCOUNTER — Ambulatory Visit (INDEPENDENT_AMBULATORY_CARE_PROVIDER_SITE_OTHER): Payer: Medicare Other | Admitting: Cardiovascular Disease

## 2017-03-01 ENCOUNTER — Encounter: Payer: Self-pay | Admitting: Cardiovascular Disease

## 2017-03-01 VITALS — BP 138/83 | HR 83 | Ht 68.0 in | Wt 165.6 lb

## 2017-03-01 DIAGNOSIS — I739 Peripheral vascular disease, unspecified: Secondary | ICD-10-CM | POA: Diagnosis not present

## 2017-03-01 MED ORDER — CILOSTAZOL 50 MG PO TABS: 50.0000 mg | ORAL_TABLET | Freq: Two times a day (BID) | ORAL | 6 refills | Status: DC

## 2017-03-01 NOTE — Assessment & Plan Note (Signed)
History of prior left SFA-like smell self-expanding stenting with right lower extremity claudication. Angiogramed him 04/04/16 revealing an occluded right SFA from the origin down to the adductor canal. I was able to recanalize this and place overlapping Viabahn  covered stents. There was two-vessel runoff. Initially his right ABI improved up to 0.95 on 04/13/16 however repeat Doppler 12/14/16 revealed his right SFA to have occluded the decline in his ABI to 0.7. He does complain of right calf claudication. I do not believe he has additional endovascular options. I am going to start him on Pletal 50 mg by mouth twice a day I will see him back in 3 or 4 months. If he has no improvement on pharmacologic therapy I will refer him to a vascular surgeon for consideration of femoropopliteal bypass grafting. We have discussed the importance of smoking cessation.

## 2017-03-01 NOTE — Patient Instructions (Signed)
Medication Instructions: START Pletal (Cilostazol) 50 mg twice daily.  Follow-Up: Your physician recommends that you schedule a follow-up appointment in: 4 months with Dr. Gwenlyn Found.  If you need a refill on your cardiac medications before your next appointment, please call your pharmacy.  Dr. [facility] at [address], [place] [address]  Main: [phone]

## 2017-03-01 NOTE — Progress Notes (Signed)
03/01/2017 Fred Hernandez   1956/09/14  [id number]  Primary Physician Antionette Fairy, PA-C Primary Cardiologist: Lorretta Harp MD Renae Gloss  HPI:  Mr Emanuelson is a 61 year old engaged African-American male with no children accompanied by his fiancs [name]. He was referred by Dr. Bronson Ing for evaluation of symptomatic PAD. I last saw him in the office 12/23/16.He has a history of 75 years of tobacco abuse smoking 1-1/2 packs per day In addition, he has treated hypertension, hyperlipidemia and diabetes. He did have a stent placed in his coronary arteries in 2016 after positive stress test. He's had recurrent claudication over the last 6 months in his right lower extremity. Dopplers performed 03/10/16 revealed a right ABI of 0.67 with occluded right SFA. . I performed angiography on him 04/04/16 confirming occluded SFA from the origin down to the adductor canal with two-vessel runoff .  I was able to revascularize him percutaneously with overlapping Viabahn covered stents with an  excellent clinical angiographic and ultrasonographic result. His follow-up Dopplers performed 04/13/16 revealed an improvement in his right ABI from 0.67 up to .95. He had recurrent discomfort in his right leg beginning late last year and follow-up Dopplers performed 12/14/16 revealed a decline in his right ABI 0.7 and an occluded right SFA. He does continue to smoke and complaint of right calf claudication.   Current Outpatient Prescriptions  Medication Sig Dispense Refill  . aspirin 81 MG chewable tablet Chew 81 mg by mouth daily.    [name] atorvastatin (LIPITOR) 80 MG tablet Take 80 mg by mouth at bedtime.     [name] CALCIUM-MAGNESIUM PO Take 1 tablet by mouth at bedtime.    . clopidogrel (PLAVIX) 75 MG tablet Take 75 mg by mouth daily.    [name] co-enzyme Q-10 30 MG capsule Take 30 mg by mouth at bedtime.     Mariane Baumgarten Sodium (COLACE PO) Take 1 tablet by mouth daily.    [name] FLOVENT HFA 110 MCG/ACT inhaler Inhale 1 puff  into the lungs 2 (two) times daily.  2  . fluticasone (FLONASE) 50 MCG/ACT nasal spray Place 1 spray into both nostrils daily.    [name] HYDROcodone-acetaminophen (NORCO) 7.5-325 MG tablet Take 1 tablet by mouth every 6 (six) hours as needed for moderate pain (Must last 30 days.Do not drive or operate machinery while taking this medicine.). 20 tablet 0  . ibuprofen (ADVIL,MOTRIN) 200 MG tablet Take 600 mg by mouth every 8 (eight) hours as needed (for pain.).    [name] ipratropium-albuterol (DUONEB) 0.5-2.5 (3) MG/3ML SOLN Take 3 mLs by nebulization every 6 (six) hours as needed (for shortness of breath).    . metFORMIN (GLUCOPHAGE) 500 MG tablet Take 500 mg by mouth 2 (two) times daily.     . metoprolol succinate (TOPROL-XL) 50 MG 24 hr tablet Take 50 mg by mouth at bedtime.     . Omega-3 Fatty Acids (FISH OIL) 1000 MG CAPS Take 1 capsule by mouth 2 (two) times daily.     . pantoprazole (PROTONIX) 40 MG tablet Take 2 tablets (80 mg total) by mouth daily. (Patient taking differently: Take 40 mg by mouth 2 (two) times daily. ) 60 tablet 1  . cilostazol (PLETAL) 50 MG tablet Take 1 tablet (50 mg total) by mouth 2 (two) times daily. 60 tablet 6   No current facility-administered medications for this visit.     Allergies  Allergen Reactions  . Lisinopril Anaphylaxis  . Penicillins Anaphylaxis  Has patient had a PCN reaction causing immediate rash, facial/tongue/throat swelling, SOB or lightheadedness with hypotension:Yes Has patient had a PCN reaction causing severe rash involving mucus membranes or skin necrosis:unsure Has patient had a PCN reaction that required hospitalization:unsure Has patient had a PCN reaction occurring within the last 10 years:~10 years per patient If all of the above answers are "NO", then may proceed with Cephalosporin use.   . Ace Inhibitors Swelling    Social History   Social History  . Marital status: Single    Spouse name: N/A  . Number of children: N/A  . Years  of education: N/A   Occupational History  . Not on file.   Social History Main Topics  . Smoking status: Current Every Day Smoker    Packs/day: 1.50    Types: Cigarettes    Start date: 02/12/1971  . Smokeless tobacco: Never Used  . Alcohol use 0.0 oz/week     Comment: Socially: beer or liquor about 3 times a week; occasional binge.  . Drug use: No  . Sexual activity: Yes   Other Topics Concern  . Not on file   Social History Narrative  . No narrative on file     Review of Systems: General: negative for chills, fever, night sweats or weight changes.  Cardiovascular: negative for chest pain, dyspnea on exertion, edema, orthopnea, palpitations, paroxysmal nocturnal dyspnea or shortness of breath Dermatological: negative for rash Respiratory: negative for cough or wheezing Urologic: negative for hematuria Abdominal: negative for nausea, vomiting, diarrhea, bright red blood per rectum, melena, or hematemesis Neurologic: negative for visual changes, syncope, or dizziness All other systems reviewed and are otherwise negative except as noted above.    Blood pressure 138/83, pulse 83, height '5\' 8"'$  (1.727 m), weight 165 lb 9.6 oz (75.1 kg), SpO2 97 %.  General appearance: alert and no distress Neck: no adenopathy, no carotid bruit, no JVD, supple, symmetrical, trachea midline and thyroid not enlarged, symmetric, no tenderness/mass/nodules Lungs: clear to auscultation bilaterally Heart: regular rate and rhythm, S1, S2 normal, no murmur, click, rub or gallop Extremities: extremities normal, atraumatic, no cyanosis or edema  EKG not performed today  ASSESSMENT AND PLAN:   Peripheral arterial disease ([place]) History of prior left SFA-like smell self-expanding stenting with right lower extremity claudication. Angiogramed him 04/04/16 revealing an occluded right SFA from the origin down to the adductor canal. I was able to recanalize this and place overlapping Viabahn  covered stents. There  was two-vessel runoff. Initially his right ABI improved up to 0.95 on 04/13/16 however repeat Doppler 12/14/16 revealed his right SFA to have occluded the decline in his ABI to 0.7. He does complain of right calf claudication. I do not believe he has additional endovascular options. I am going to start him on Pletal 50 mg by mouth twice a day I will see him back in 3 or 4 months. If he has no improvement on pharmacologic therapy I will refer him to a vascular surgeon for consideration of femoropopliteal bypass grafting. We have discussed the importance of smoking cessation.      Lorretta Harp MD FACP,FACC,FAHA, [facility] 03/01/2017 10:10 AM

## 2017-03-15 ENCOUNTER — Telehealth: Payer: Self-pay | Admitting: Orthopaedic Surgery

## 2017-03-15 NOTE — Telephone Encounter (Signed)
No more narcotics. Take Tylenol, Advil or Aleve.

## 2017-03-15 NOTE — Telephone Encounter (Signed)
Patient requests refill on Hydrocodone/Acetaminophen 7.5-325  Mgs.  Qty  20  Sig: Take 1 tablet by mouth every 6 (six) hours as needed for moderate pain (Must last 30 days.Do not drive or operate machinery while taking this medicine.).

## 2017-03-31 ENCOUNTER — Ambulatory Visit: Payer: Medicare Other | Admitting: Cardiovascular Disease

## 2017-04-26 ENCOUNTER — Ambulatory Visit: Payer: Medicare Other | Admitting: Orthopaedic Surgery

## 2017-05-08 ENCOUNTER — Ambulatory Visit: Payer: Medicare Other | Admitting: Cardiovascular Disease

## 2017-05-09 ENCOUNTER — Ambulatory Visit: Payer: Medicare Other | Admitting: Orthopaedic Surgery

## 2017-05-11 ENCOUNTER — Encounter: Payer: Self-pay | Admitting: Orthopaedic Surgery

## 2017-05-11 ENCOUNTER — Ambulatory Visit (INDEPENDENT_AMBULATORY_CARE_PROVIDER_SITE_OTHER): Payer: Medicare Other | Admitting: Orthopaedic Surgery

## 2017-05-11 VITALS — BP 117/66 | HR 100 | Temp 97.7°F | Ht 68.0 in | Wt 166.0 lb

## 2017-05-11 DIAGNOSIS — F17210 Nicotine dependence, cigarettes, uncomplicated: Secondary | ICD-10-CM

## 2017-05-11 DIAGNOSIS — M25512 Pain in left shoulder: Secondary | ICD-10-CM

## 2017-05-11 DIAGNOSIS — G8929 Other chronic pain: Secondary | ICD-10-CM | POA: Diagnosis not present

## 2017-05-11 NOTE — Patient Instructions (Signed)
Steps to Quit Smoking Smoking tobacco can be bad for your health. It can also affect almost every organ in your body. Smoking puts you and people around you at risk for many serious Sigismund Cross-lasting (chronic) diseases. Quitting smoking is hard, but it is one of the best things that you can do for your health. It is never too late to quit. What are the benefits of quitting smoking? When you quit smoking, you lower your risk for getting serious diseases and conditions. They can include:  Lung cancer or lung disease.  Heart disease.  Stroke.  Heart attack.  Not being able to have children (infertility).  Weak bones (osteoporosis) and broken bones (fractures).  If you have coughing, wheezing, and shortness of breath, those symptoms may get better when you quit. You may also get sick less often. If you are pregnant, quitting smoking can help to lower your chances of having a baby of low birth weight. What can I do to help me quit smoking? Talk with your doctor about what can help you quit smoking. Some things you can do (strategies) include:  Quitting smoking totally, instead of slowly cutting back how much you smoke over a period of time.  Going to in-person counseling. You are more likely to quit if you go to many counseling sessions.  Using resources and support systems, such as: ? Online chats with a counselor. ? Phone quitlines. ? Printed self-help materials. ? Support groups or group counseling. ? Text messaging programs. ? Mobile phone apps or applications.  Taking medicines. Some of these medicines may have nicotine in them. If you are pregnant or breastfeeding, do not take any medicines to quit smoking unless your doctor says it is okay. Talk with your doctor about counseling or other things that can help you.  Talk with your doctor about using more than one strategy at the same time, such as taking medicines while you are also going to in-person counseling. This can help make  quitting easier. What things can I do to make it easier to quit? Quitting smoking might feel very hard at first, but there is a lot that you can do to make it easier. Take these steps:  Talk to your family and friends. Ask them to support and encourage you.  Call phone quitlines, reach out to support groups, or work with a counselor.  Ask people who smoke to not smoke around you.  Avoid places that make you want (trigger) to smoke, such as: ? Bars. ? Parties. ? Smoke-break areas at work.  Spend time with people who do not smoke.  Lower the stress in your life. Stress can make you want to smoke. Try these things to help your stress: ? Getting regular exercise. ? Deep-breathing exercises. ? Yoga. ? Meditating. ? Doing a body scan. To do this, close your eyes, focus on one area of your body at a time from head to toe, and notice which parts of your body are tense. Try to relax the muscles in those areas.  Download or buy apps on your mobile phone or tablet that can help you stick to your quit plan. There are many free apps, such as QuitGuide from the CDC (Centers for Disease Control and Prevention). You can find more support from smokefree.gov and other websites.  This information is not intended to replace advice given to you by your health care provider. Make sure you discuss any questions you have with your health care provider. Document Released: 08/13/2009 Document   Revised: 06/14/2016 Document Reviewed: 03/03/2015 Elsevier Interactive Patient Education  2018 Elsevier Inc.  

## 2017-05-11 NOTE — Progress Notes (Signed)
Patient IO:Fred Hernandez, male DOB:1956-07-14, 61 y.o. GDJ:[id number]  Chief Complaint  Patient presents with  . Follow-up    Left Shoulder    HPI  Fred Hernandez is a 61 y.o. male who has left shoulder pain.  He is much improved and has little pain now.  He has no new trauma. HPI  Body mass index is 25.24 kg/m.  ROS  Review of Systems  Constitutional:       Patient has Diabetes Mellitus. Patient has hypertension. Patient has COPD or shortness of breath. Patient does not have BMI > 35. Patient has current smoking history  HENT: Negative for congestion.   Cardiovascular: Negative for chest pain.  Endocrine: Positive for cold intolerance.  Musculoskeletal: Positive for arthralgias and myalgias.  Allergic/Immunologic: Positive for environmental allergies.  Neurological: Negative for numbness.    Past Medical History:  Diagnosis Date  . CAD (coronary artery disease)     s/p stent placement unknown artery 12/2014,  . Diabetes mellitus ([place])   . GERD (gastroesophageal reflux disease)   . History of prostate cancer   . Hyperlipemia   . Hypertension   . Peripheral arterial disease ([place])    a. h/o stents. b. 03/2016  - successful PTA and covered stenting using overlapping Viabahn covered stents of a long segment in-stent restenosis of previously placed nitinol self expanding stents in the proximal mid and distal right SFA.  . Tobacco abuse     Past Surgical History:  Procedure Laterality Date  . COLONOSCOPY WITH PROPOFOL N/A 11/22/2016   Procedure: COLONOSCOPY WITH PROPOFOL;  Surgeon: [name], MD;  [facility];  Service: Endoscopy;  Laterality: N/A;  10:00 am  . CORONARY STENT PLACEMENT  12-2014   [facility] [employer]  . FEMORAL ARTERY STENT Bilateral 03/2013  . PERIPHERAL VASCULAR CATHETERIZATION N/A 04/04/2016   Procedure: Lower Extremity Angiography;  Surgeon: Lorretta Harp, MD;  Location: [place] [facility];  Service: Cardiovascular;   Laterality: N/A;  . PERIPHERAL VASCULAR CATHETERIZATION N/A 04/04/2016   Procedure: Abdominal Aortogram;  Surgeon: Lorretta Harp, MD;  Location: [place] [facility];  Service: Cardiovascular;  Laterality: N/A;  . PERIPHERAL VASCULAR CATHETERIZATION  04/04/2016   Procedure: Peripheral Vascular Intervention;  Surgeon: Lorretta Harp, MD;  Location: [place] [facility];  Service: Cardiovascular;;  rt SFA stent  . POLYPECTOMY  11/22/2016   Procedure: POLYPECTOMY;  Surgeon: [name], MD;  [facility];  Service: Endoscopy;;  descending colon polyp, transverse colon polyp, sigmoid colon polyp, rectal polyp  . STENT PLACEMENT VASCULAR ([place] HX)  2013    Family History  Problem Relation Age of Onset  . Diabetes Mother   . Heart disease Mother   . Hypertension Mother   . Healthy Brother   . Other Brother        accident   . Arthritis Sister   . Arthritis Sister   . SIDS Brother   . Colon cancer Neg Hx     Social History Social History  Substance Use Topics  . Smoking status: Current Every Day Smoker    Packs/day: 1.50    Types: Cigarettes    Start date: 02/12/1971  . Smokeless tobacco: Never Used  . Alcohol use 0.0 oz/week     Comment: Socially: beer or liquor about 3 times a week; occasional binge.    Allergies  Allergen Reactions  . Lisinopril Anaphylaxis  . Penicillins Anaphylaxis    Has patient had a  PCN reaction causing immediate rash, facial/tongue/throat swelling, SOB or lightheadedness with hypotension:Yes Has patient had a PCN reaction causing severe rash involving mucus membranes or skin necrosis:unsure Has patient had a PCN reaction that required hospitalization:unsure Has patient had a PCN reaction occurring within the last 10 years:~10 years per patient If all of the above answers are "NO", then may proceed with Cephalosporin use.   . Ace Inhibitors Swelling    Current Outpatient Prescriptions  Medication Sig Dispense Refill  . aspirin 81 MG  chewable tablet Chew 81 mg by mouth daily.    [name] atorvastatin (LIPITOR) 80 MG tablet Take 80 mg by mouth at bedtime.     [name] CALCIUM-MAGNESIUM PO Take 1 tablet by mouth at bedtime.    . cilostazol (PLETAL) 50 MG tablet Take 1 tablet (50 mg total) by mouth 2 (two) times daily. 60 tablet 6  . clopidogrel (PLAVIX) 75 MG tablet Take 75 mg by mouth daily.    [name] co-enzyme Q-10 30 MG capsule Take 30 mg by mouth at bedtime.     Mariane Baumgarten Sodium (COLACE PO) Take 1 tablet by mouth daily.    [name] FLOVENT HFA 110 MCG/ACT inhaler Inhale 1 puff into the lungs 2 (two) times daily.  2  . fluticasone (FLONASE) 50 MCG/ACT nasal spray Place 1 spray into both nostrils daily.    [name] HYDROcodone-acetaminophen (NORCO) 7.5-325 MG tablet Take 1 tablet by mouth every 6 (six) hours as needed for moderate pain (Must last 30 days.Do not drive or operate machinery while taking this medicine.). 20 tablet 0  . ibuprofen (ADVIL,MOTRIN) 200 MG tablet Take 600 mg by mouth every 8 (eight) hours as needed (for pain.).    [name] ipratropium-albuterol (DUONEB) 0.5-2.5 (3) MG/3ML SOLN Take 3 mLs by nebulization every 6 (six) hours as needed (for shortness of breath).    . metFORMIN (GLUCOPHAGE) 500 MG tablet Take 500 mg by mouth 2 (two) times daily.     . metoprolol succinate (TOPROL-XL) 50 MG 24 hr tablet Take 50 mg by mouth at bedtime.     . Omega-3 Fatty Acids (FISH OIL) 1000 MG CAPS Take 1 capsule by mouth 2 (two) times daily.     . pantoprazole (PROTONIX) 40 MG tablet Take 2 tablets (80 mg total) by mouth daily. (Patient taking differently: Take 40 mg by mouth 2 (two) times daily. ) 60 tablet 1   No current facility-administered medications for this visit.      Physical Exam  Blood pressure 117/66, pulse 100, temperature 97.7 F (36.5 C), height 5\' 8"  (1.727 m), weight 166 lb (75.3 kg).  Constitutional: overall normal hygiene, normal nutrition, well developed, normal grooming, normal body habitus. Assistive  device:none  Musculoskeletal: gait and station Limp none, muscle tone and strength are normal, no tremors or atrophy is present.  .  Neurological: coordination overall normal.  Deep tendon reflex/nerve stretch intact.  Sensation normal.  Cranial nerves II-XII intact.   Skin:   Normal overall no scars, lesions, ulcers or rashes. No psoriasis.  Psychiatric: Alert and oriented x 3.  Recent memory intact, remote memory unclear.  Normal mood and affect. Well groomed.  Good eye contact.  Cardiovascular: overall no swelling, no varicosities, no edema bilaterally, normal temperatures of the legs and arms, no clubbing, cyanosis and good capillary refill.  Lymphatic: palpation is normal.  His left shoulder has full ROM, no pain, NV intact.  Negative exam.   The patient has been educated about the nature of the problem(s) and  counseled on treatment options.  The patient appeared to understand what I have discussed and is in agreement with it.  Encounter Diagnoses  Name Primary?  . Chronic left shoulder pain Yes  . Cigarette nicotine dependence without complication     PLAN Call if any problems.  Precautions discussed.  Continue current medications.   Return to clinic PRN   He is to try to cut back on smoking. Electronically Signed Sanjuana Kava, MD 7/12/20183:48 PM

## 2017-06-02 ENCOUNTER — Ambulatory Visit: Payer: Medicare Other | Admitting: Cardiovascular Disease

## 2017-06-27 ENCOUNTER — Ambulatory Visit: Payer: Medicare Other | Admitting: Cardiovascular Disease

## 2017-07-06 ENCOUNTER — Ambulatory Visit (INDEPENDENT_AMBULATORY_CARE_PROVIDER_SITE_OTHER): Payer: Medicare Other | Admitting: Cardiovascular Disease

## 2017-07-06 ENCOUNTER — Encounter: Payer: Self-pay | Admitting: Cardiovascular Disease

## 2017-07-06 VITALS — BP 120/66 | HR 96 | Ht 68.0 in | Wt 164.0 lb

## 2017-07-06 DIAGNOSIS — Z955 Presence of coronary angioplasty implant and graft: Secondary | ICD-10-CM

## 2017-07-06 DIAGNOSIS — I739 Peripheral vascular disease, unspecified: Secondary | ICD-10-CM

## 2017-07-06 DIAGNOSIS — Z720 Tobacco use: Secondary | ICD-10-CM | POA: Diagnosis not present

## 2017-07-06 DIAGNOSIS — I25118 Atherosclerotic heart disease of native coronary artery with other forms of angina pectoris: Secondary | ICD-10-CM | POA: Diagnosis not present

## 2017-07-06 DIAGNOSIS — E785 Hyperlipidemia, unspecified: Secondary | ICD-10-CM

## 2017-07-06 NOTE — Progress Notes (Signed)
SUBJECTIVE: The patient presents for routine follow up. He saw Dr. Gwenlyn Found on 03/01/17 for PVD with occluded SFA and right calf claudication. He was started on Pletal. He has a h/o CAD and stent placement on 01/16/2015 (?artery) by Dr. Hildred Alamin. He has a long h/o tobacco abuse.  The patient denies any symptoms of chest pain, palpitations, shortness of breath, lightheadedness, dizziness, leg swelling, orthopnea, PND, and syncope.  He does complain of right calf claudication, and is scheduled to see Dr. Gwenlyn Found on 07/12/17.     Review of Systems: As per "subjective", otherwise negative.  Allergies  Allergen Reactions  . Lisinopril Anaphylaxis  . Penicillins Anaphylaxis    Has patient had a PCN reaction causing immediate rash, facial/tongue/throat swelling, SOB or lightheadedness with hypotension:Yes Has patient had a PCN reaction causing severe rash involving mucus membranes or skin necrosis:unsure Has patient had a PCN reaction that required hospitalization:unsure Has patient had a PCN reaction occurring within the last 10 years:~10 years per patient If all of the above answers are "NO", then may proceed with Cephalosporin use.   . Ace Inhibitors Swelling    Current Outpatient Prescriptions  Medication Sig Dispense Refill  . aspirin 81 MG chewable tablet Chew 81 mg by mouth daily.    [name] atorvastatin (LIPITOR) 80 MG tablet Take 80 mg by mouth at bedtime.     [name] CALCIUM-MAGNESIUM PO Take 1 tablet by mouth at bedtime.    . cilostazol (PLETAL) 50 MG tablet Take 1 tablet (50 mg total) by mouth 2 (two) times daily. 60 tablet 6  . clopidogrel (PLAVIX) 75 MG tablet Take 75 mg by mouth daily.    [name] co-enzyme Q-10 30 MG capsule Take 30 mg by mouth at bedtime.     Mariane Baumgarten Sodium (COLACE PO) Take 1 tablet by mouth daily.    [name] FLOVENT HFA 110 MCG/ACT inhaler Inhale 1 puff into the lungs 2 (two) times daily.  2  . fluticasone (FLONASE) 50 MCG/ACT nasal spray Place 1 spray into both  nostrils daily.    [name] HYDROcodone-acetaminophen (NORCO) 7.5-325 MG tablet Take 1 tablet by mouth every 6 (six) hours as needed for moderate pain (Must last 30 days.Do not drive or operate machinery while taking this medicine.). 20 tablet 0  . ibuprofen (ADVIL,MOTRIN) 200 MG tablet Take 600 mg by mouth every 8 (eight) hours as needed (for pain.).    [name] ipratropium-albuterol (DUONEB) 0.5-2.5 (3) MG/3ML SOLN Take 3 mLs by nebulization every 6 (six) hours as needed (for shortness of breath).    . metFORMIN (GLUCOPHAGE) 500 MG tablet Take 500 mg by mouth 2 (two) times daily.     . metoprolol succinate (TOPROL-XL) 50 MG 24 hr tablet Take 50 mg by mouth at bedtime.     . Omega-3 Fatty Acids (FISH OIL) 1000 MG CAPS Take 1 capsule by mouth 2 (two) times daily.     . pantoprazole (PROTONIX) 40 MG tablet Take 2 tablets (80 mg total) by mouth daily. (Patient taking differently: Take 40 mg by mouth 2 (two) times daily. ) 60 tablet 1   No current facility-administered medications for this visit.     Past Medical History:  Diagnosis Date  . CAD (coronary artery disease)     s/p stent placement unknown artery 12/2014,  . Diabetes mellitus ([place])   . GERD (gastroesophageal reflux disease)   . History of prostate cancer   . Hyperlipemia   . Hypertension   . Peripheral arterial  disease ([place])    a. h/o stents. b. 03/2016  - successful PTA and covered stenting using overlapping Viabahn covered stents of a long segment in-stent restenosis of previously placed nitinol self expanding stents in the proximal mid and distal right SFA.  . Tobacco abuse     Past Surgical History:  Procedure Laterality Date  . COLONOSCOPY WITH PROPOFOL N/A 11/22/2016   Procedure: COLONOSCOPY WITH PROPOFOL;  Surgeon: [name], MD;  [facility];  Service: Endoscopy;  Laterality: N/A;  10:00 am  . CORONARY STENT PLACEMENT  12-2014   [facility] [employer]  . FEMORAL ARTERY STENT Bilateral 03/2013  .  PERIPHERAL VASCULAR CATHETERIZATION N/A 04/04/2016   Procedure: Lower Extremity Angiography;  Surgeon: Lorretta Harp, MD;  Location: [place] [facility];  Service: Cardiovascular;  Laterality: N/A;  . PERIPHERAL VASCULAR CATHETERIZATION N/A 04/04/2016   Procedure: Abdominal Aortogram;  Surgeon: Lorretta Harp, MD;  Location: [place] [facility];  Service: Cardiovascular;  Laterality: N/A;  . PERIPHERAL VASCULAR CATHETERIZATION  04/04/2016   Procedure: Peripheral Vascular Intervention;  Surgeon: Lorretta Harp, MD;  Location: [place] [facility];  Service: Cardiovascular;;  rt SFA stent  . POLYPECTOMY  11/22/2016   Procedure: POLYPECTOMY;  Surgeon: [name], MD;  [facility];  Service: Endoscopy;;  descending colon polyp, transverse colon polyp, sigmoid colon polyp, rectal polyp  . STENT PLACEMENT VASCULAR ([place] HX)  2013    Social History   Social History  . Marital status: Single    Spouse name: N/A  . Number of children: N/A  . Years of education: N/A   Occupational History  . Not on file.   Social History Main Topics  . Smoking status: Current Every Day Smoker    Packs/day: 1.00    Types: Cigarettes    Start date: 02/12/1971  . Smokeless tobacco: Never Used  . Alcohol use 0.0 oz/week     Comment: Socially: beer or liquor about 3 times a week; occasional binge.  . Drug use: No  . Sexual activity: Yes   Other Topics Concern  . Not on file   Social History Narrative  . No narrative on file     Vitals:   07/06/17 1507  BP: 120/66  Pulse: 96  SpO2: 96%  Weight: 164 lb (74.4 kg)  Height: 5\' 8"  (1.727 m)    Wt Readings from Last 3 Encounters:  07/06/17 164 lb (74.4 kg)  05/11/17 166 lb (75.3 kg)  03/01/17 165 lb 9.6 oz (75.1 kg)     PHYSICAL EXAM General: NAD HEENT: Normal. Neck: No JVD, no thyromegaly. Lungs: Clear to auscultation bilaterally with normal respiratory effort. CV: Nondisplaced PMI.  Regular rate and rhythm, normal S1/S2, no S3/S4,  no murmur. No pretibial or periankle edema.  No carotid bruit.   Abdomen: Soft, nontender, no distention.  Neurologic: Alert and oriented.  Psych: Normal affect. Skin: Normal. Musculoskeletal: No gross deformities.    ECG: Most recent ECG reviewed.   Labs: Lab Results  Component Value Date/Time   K 4.0 11/21/2016 09:21 AM   BUN 10 11/21/2016 09:21 AM   CREATININE 0.79 11/21/2016 09:21 AM   CREATININE 0.86 03/29/2016 11:39 AM   TSH 3.60 03/29/2016 11:39 AM   HGB 13.9 11/21/2016 09:21 AM     Lipids: No results found for: LDLCALC, LDLDIRECT, CHOL, TRIG, HDL     ASSESSMENT AND PLAN:  1. PVD with right calf claudication: Occluded SFA as noted above.  Continue aspirin, Pletal, and statin. He follows with Dr. Gwenlyn Found and will see him 07/12/17. Tobacco cessation emphasized.   2. CAD with stent: He has a h/o CAD and stent placement on 01/16/2015 (?artery) by Dr. Hildred Alamin. I will try and obtain records. Symptomatically stable. Continue aspirin,  Metoprolol, and statin. I will stop Plavix.  3. Hyperlipidemia: Continue high intensity statin therapy with Lipitor 80 mg.  4. Tobacco abuse: Cessation counseling previously provided.      Disposition: Follow up 1 year.   Kate Sable, M.D., F.A.C.C.

## 2017-07-06 NOTE — Patient Instructions (Addendum)
Medication Instructions:   Stop Plavix (Clopidogrel).   Continue all other medications.    Labwork: none  Testing/Procedures: none  Follow-Up: Your physician wants you to follow up in:  1 year.  You will receive a reminder letter in the mail one-two months in advance.  If you don't receive a letter, please call our office to schedule the follow up appointment   Any Other Special Instructions Will Be Listed Below (If Applicable).  If you need a refill on your cardiac medications before your next appointment, please call your pharmacy.

## 2017-07-12 ENCOUNTER — Ambulatory Visit (INDEPENDENT_AMBULATORY_CARE_PROVIDER_SITE_OTHER): Payer: Medicare Other | Admitting: Cardiovascular Disease

## 2017-07-12 ENCOUNTER — Encounter: Payer: Self-pay | Admitting: Cardiovascular Disease

## 2017-07-12 VITALS — BP 124/82 | HR 86 | Ht 68.0 in | Wt 164.6 lb

## 2017-07-12 DIAGNOSIS — I739 Peripheral vascular disease, unspecified: Secondary | ICD-10-CM

## 2017-07-12 DIAGNOSIS — I10 Essential (primary) hypertension: Secondary | ICD-10-CM

## 2017-07-12 DIAGNOSIS — Z720 Tobacco use: Secondary | ICD-10-CM | POA: Diagnosis not present

## 2017-07-12 DIAGNOSIS — E7800 Pure hypercholesterolemia, unspecified: Secondary | ICD-10-CM

## 2017-07-12 NOTE — Assessment & Plan Note (Signed)
History of essential hypertension blood pressure measured 124/82. He is on metoprolol. Continue current meds at current dosing

## 2017-07-12 NOTE — Progress Notes (Signed)
07/12/2017 Rafferty [place]   [birth year]  [id number]  Primary Physician Vidal Schwalbe, MD Primary Cardiologist: Lorretta Harp MD Renae Gloss  HPI:  Fred Hernandez is a 61 y.o. male  engaged African-American male with no children accompanied by his fiancs [name]. He was referred by Dr. Bronson Ing for evaluation of symptomatic PAD. I last saw him in the office 03/01/17.He has a history of 75 years of tobacco abuse smoking 1-1/2 packs per day In addition, he has treated hypertension, hyperlipidemia and diabetes. He did have a stent placed in his coronary arteries in 2016 after positive stress test. He's had recurrent claudication over the last 6 months in his right lower extremity. Dopplers performed 03/10/16 revealed a right ABI of 0.67 with occluded right SFA. . I performed angiography on him 04/04/16 confirming occluded SFA from the origin down to the adductor canal with two-vessel runoff .  I was able to revascularize him percutaneously with overlapping Viabahn covered stents with an  excellent clinical angiographic and ultrasonographic result. His follow-up Dopplers performed 04/13/16 revealed an improvement in his right ABI from 0.67 up to .95. He had recurrent discomfort in his right leg beginning late last year and follow-up Dopplers performed 12/14/16 revealed a decline in his right ABI 0.7 and an occluded right SFA. He does continue to smoke and complaint of right calf claudication. Since I saw him back in 4 months ago he continues to have right calf claudication which is lifestyle limiting and he continues to smoke. We've discussed revascularization options which would be limited to femoropopliteal bypass grafting.    Current Meds  Medication Sig  . aspirin 81 MG chewable tablet Chew 81 mg by mouth daily.  [name] atorvastatin (LIPITOR) 80 MG tablet Take 80 mg by mouth at bedtime.   [name] CALCIUM-MAGNESIUM PO Take 1 tablet by mouth at bedtime.  . cilostazol (PLETAL) 50 MG tablet Take 1  tablet (50 mg total) by mouth 2 (two) times daily.  [name] co-enzyme Q-10 30 MG capsule Take 30 mg by mouth at bedtime.   Mariane Baumgarten Sodium (COLACE PO) Take 1 tablet by mouth daily.  [name] FLOVENT HFA 110 MCG/ACT inhaler Inhale 1 puff into the lungs 2 (two) times daily.  . fluticasone (FLONASE) 50 MCG/ACT nasal spray Place 1 spray into both nostrils daily.  [name] ipratropium-albuterol (DUONEB) 0.5-2.5 (3) MG/3ML SOLN Take 3 mLs by nebulization every 6 (six) hours as needed (for shortness of breath).  . metFORMIN (GLUCOPHAGE) 500 MG tablet Take 500 mg by mouth 2 (two) times daily.   . metoprolol succinate (TOPROL-XL) 50 MG 24 hr tablet Take 50 mg by mouth at bedtime.   . Omega-3 Fatty Acids (FISH OIL) 1000 MG CAPS Take 1 capsule by mouth 2 (two) times daily.   . pantoprazole (PROTONIX) 40 MG tablet Take 40 mg by mouth 2 (two) times daily.     Allergies  Allergen Reactions  . Lisinopril Anaphylaxis  . Penicillins Anaphylaxis    Has patient had a PCN reaction causing immediate rash, facial/tongue/throat swelling, SOB or lightheadedness with hypotension:Yes Has patient had a PCN reaction causing severe rash involving mucus membranes or skin necrosis:unsure Has patient had a PCN reaction that required hospitalization:unsure Has patient had a PCN reaction occurring within the last 10 years:~10 years per patient If all of the above answers are "NO", then may proceed with Cephalosporin use.   . Ace Inhibitors Swelling    Social History   Social History  .  Marital status: Single    Spouse name: N/A  . Number of children: N/A  . Years of education: N/A   Occupational History  . Not on file.   Social History Main Topics  . Smoking status: Current Every Day Smoker    Packs/day: 1.00    Types: Cigarettes    Start date: 02/12/1971  . Smokeless tobacco: Never Used  . Alcohol use 0.0 oz/week     Comment: Socially: beer or liquor about 3 times a week; occasional binge.  . Drug use: No  . Sexual  activity: Yes   Other Topics Concern  . Not on file   Social History Narrative  . No narrative on file     Review of Systems: General: negative for chills, fever, night sweats or weight changes.  Cardiovascular: negative for chest pain, dyspnea on exertion, edema, orthopnea, palpitations, paroxysmal nocturnal dyspnea or shortness of breath Dermatological: negative for rash Respiratory: negative for cough or wheezing Urologic: negative for hematuria Abdominal: negative for nausea, vomiting, diarrhea, bright red blood per rectum, melena, or hematemesis Neurologic: negative for visual changes, syncope, or dizziness All other systems reviewed and are otherwise negative except as noted above.    Blood pressure 124/82, pulse 86, height 5\' 8"  (1.727 m), weight 164 lb 9.6 oz (74.7 kg).  General appearance: no distress Neck: no adenopathy, no carotid bruit, no JVD, supple, symmetrical, trachea midline and thyroid not enlarged, symmetric, no tenderness/mass/nodules Lungs: clear to auscultation bilaterally Heart: regular rate and rhythm, S1, S2 normal, no murmur, click, rub or gallop Extremities: extremities normal, atraumatic, no cyanosis or edema  EKG sinus rhythm at 86 without ST or T-wave changes. I personally reviewed this EKG.  ASSESSMENT AND PLAN:   Peripheral arterial disease ([place]) History of history of peripheral arterial disease status post complex right SFA percutaneous intervention 04/04/16 with implantation of overlapping via Bahn covered stents excellent result. His ABI increased from 0.67-0.95 and his claudication resolved at that time. He began to have recurrent symptoms late last year and Dopplers performed 12/14/16 revealed an occluded SFA with a reduction in his right ABI back down to 0.7. He does have lifestyle limiting claudication on that side. We talked about options and I do not think he is at percutaneous revascularization candidate. He would need an pop bypass grafting  but only if he has stop smoking.  Essential hypertension History of essential hypertension blood pressure measured 124/82. He is on metoprolol. Continue current meds at current dosing  Hyperlipidemia History of hyperlipidemia on statin therapy followed by his PCP  Tobacco abuse History of continued tobacco abuse recalcitrant risk factor modification.      Lorretta Harp MD FACP,FACC,FAHA, [facility] 07/12/2017 11:37 AM

## 2017-07-12 NOTE — Patient Instructions (Signed)

## 2017-07-12 NOTE — Assessment & Plan Note (Signed)
History of history of peripheral arterial disease status post complex right SFA percutaneous intervention 04/04/16 with implantation of overlapping via Bahn covered stents excellent result. His ABI increased from 0.67-0.95 and his claudication resolved at that time. He began to have recurrent symptoms late last year and Dopplers performed 12/14/16 revealed an occluded SFA with a reduction in his right ABI back down to 0.7. He does have lifestyle limiting claudication on that side. We talked about options and I do not think he is at percutaneous revascularization candidate. He would need an pop bypass grafting but only if he has stop smoking.

## 2017-07-12 NOTE — Assessment & Plan Note (Signed)
History of continued tobacco abuse recalcitrant risk factor modification 

## 2017-07-12 NOTE — Assessment & Plan Note (Signed)
History of hyperlipidemia on statin therapy followed by his PCP 

## 2017-09-27 ENCOUNTER — Other Ambulatory Visit: Payer: Self-pay | Admitting: Cardiovascular Disease

## 2017-09-27 DIAGNOSIS — I739 Peripheral vascular disease, unspecified: Secondary | ICD-10-CM

## 2017-10-13 ENCOUNTER — Ambulatory Visit: Payer: Medicare Other | Admitting: Urology

## 2017-11-01 ENCOUNTER — Ambulatory Visit: Payer: Medicare Other | Admitting: Orthopaedic Surgery

## 2017-11-07 ENCOUNTER — Ambulatory Visit (INDEPENDENT_AMBULATORY_CARE_PROVIDER_SITE_OTHER): Payer: Medicare Other

## 2017-11-07 ENCOUNTER — Encounter: Payer: Self-pay | Admitting: Orthopaedic Surgery

## 2017-11-07 ENCOUNTER — Ambulatory Visit (INDEPENDENT_AMBULATORY_CARE_PROVIDER_SITE_OTHER): Payer: Medicare Other | Admitting: Orthopaedic Surgery

## 2017-11-07 VITALS — BP 136/75 | HR 121 | Ht 68.0 in | Wt 162.0 lb

## 2017-11-07 DIAGNOSIS — M25511 Pain in right shoulder: Secondary | ICD-10-CM

## 2017-11-07 DIAGNOSIS — F17210 Nicotine dependence, cigarettes, uncomplicated: Secondary | ICD-10-CM

## 2017-11-07 DIAGNOSIS — I10 Essential (primary) hypertension: Secondary | ICD-10-CM | POA: Diagnosis not present

## 2017-11-07 DIAGNOSIS — I739 Peripheral vascular disease, unspecified: Secondary | ICD-10-CM

## 2017-11-07 NOTE — Progress Notes (Signed)
Patient Fred Hernandez, male DOB:[birth year], 62 y.o. AVW:[id number]  Chief Complaint  Patient presents with  . Shoulder Pain    right     HPI  Fred Hernandez is a 62 y.o. male who is having right shoulder pain.  He had left shoulder pain in the past.  Now the right shoulder is hurting with overhead use and rolling on it at night.  He has no numbness, no trauma, no redness, no swelling.  He has tried ice, heat and Advil with some slight help.    He smokes.  He had cut back but is back up to a full pack a day now.  He will try to cut back. HPI  Body mass index is 24.63 kg/m.  ROS  Review of Systems  Past Medical History:  Diagnosis Date  . CAD (coronary artery disease)     s/p stent placement unknown artery 12/2014,  . Diabetes mellitus ([place])   . GERD (gastroesophageal reflux disease)   . History of prostate cancer   . Hyperlipemia   . Hypertension   . Peripheral arterial disease ([place])    a. h/o stents. b. 03/2016  - successful PTA and covered stenting using overlapping Viabahn covered stents of a long segment in-stent restenosis of previously placed nitinol self expanding stents in the proximal mid and distal right SFA.  . Tobacco abuse     Past Surgical History:  Procedure Laterality Date  . COLONOSCOPY WITH PROPOFOL N/A 11/22/2016   Procedure: COLONOSCOPY WITH PROPOFOL;  Surgeon: [name], MD;  Location: [facility];  Service: Endoscopy;  Laterality: N/A;  10:00 am  . CORONARY STENT PLACEMENT  12-2014   [facility] [employer]  . FEMORAL ARTERY STENT Bilateral 03/2013  . PERIPHERAL VASCULAR CATHETERIZATION N/A 04/04/2016   Procedure: Lower Extremity Angiography;  Surgeon: Lorretta Harp, MD;  Location: [place] [facility];  Service: Cardiovascular;  Laterality: N/A;  . PERIPHERAL VASCULAR CATHETERIZATION N/A 04/04/2016   Procedure: Abdominal Aortogram;  Surgeon: Lorretta Harp, MD;  Location: [place] [facility];  Service: Cardiovascular;  Laterality: N/A;  .  PERIPHERAL VASCULAR CATHETERIZATION  04/04/2016   Procedure: Peripheral Vascular Intervention;  Surgeon: Lorretta Harp, MD;  Location: [place] [facility];  Service: Cardiovascular;;  rt SFA stent  . POLYPECTOMY  11/22/2016   Procedure: POLYPECTOMY;  Surgeon: [name], MD;  Location: [facility];  Service: Endoscopy;;  descending colon polyp, transverse colon polyp, sigmoid colon polyp, rectal polyp  . STENT PLACEMENT VASCULAR ([place] [facility])  2013    Family History  Problem Relation Age of Onset  . Diabetes Mother   . Heart disease Mother   . Hypertension Mother   . Healthy Brother   . Other Brother        accident   . Arthritis Sister   . Arthritis Sister   . SIDS Brother   . Colon cancer Neg Hx     Social History Social History   Tobacco Use  . Smoking status: Current Every Day Smoker    Packs/day: 1.00    Types: Cigarettes    Start date: 02/12/1971  . Smokeless tobacco: Never Used  Substance Use Topics  . Alcohol use: Yes    Alcohol/week: 0.0 oz    Comment: Socially: beer or liquor about 3 times a week; occasional binge.  . Drug use: No    Allergies  Allergen Reactions  . Lisinopril Anaphylaxis  . Penicillins Anaphylaxis    Has  patient had a PCN reaction causing immediate rash, facial/tongue/throat swelling, SOB or lightheadedness with hypotension:Yes Has patient had a PCN reaction causing severe rash involving mucus membranes or skin necrosis:unsure Has patient had a PCN reaction that required hospitalization:unsure Has patient had a PCN reaction occurring within the last 10 years:~10 years per patient If all of the above answers are "NO", then may proceed with Cephalosporin use.   . Ace Inhibitors Swelling    Current Outpatient Medications  Medication Sig Dispense Refill  . aspirin 81 MG chewable tablet Chew 81 mg by mouth daily.    [name] atorvastatin (LIPITOR) 80 MG tablet Take 80 mg by mouth at bedtime.     [name] CALCIUM-MAGNESIUM PO Take 1 tablet by mouth at  bedtime.    . cilostazol (PLETAL) 50 MG tablet TAKE 1 TABLET(50 MG) BY MOUTH TWICE DAILY 60 tablet 6  . co-enzyme Q-10 30 MG capsule Take 30 mg by mouth at bedtime.     Mariane Baumgarten Sodium (COLACE PO) Take 1 tablet by mouth daily.    [name] FLOVENT HFA 110 MCG/ACT inhaler Inhale 1 puff into the lungs 2 (two) times daily.  2  . fluticasone (FLONASE) 50 MCG/ACT nasal spray Place 1 spray into both nostrils daily.    [name] ipratropium-albuterol (DUONEB) 0.5-2.5 (3) MG/3ML SOLN Take 3 mLs by nebulization every 6 (six) hours as needed (for shortness of breath).    [name] levofloxacin (LEVAQUIN) 750 MG tablet TK 1 T PO QD FOR 10 DAYS  0  . metFORMIN (GLUCOPHAGE) 500 MG tablet Take 500 mg by mouth 2 (two) times daily.     . metoprolol succinate (TOPROL-XL) 50 MG 24 hr tablet Take 50 mg by mouth at bedtime.     . Omega-3 Fatty Acids (FISH OIL) 1000 MG CAPS Take 1 capsule by mouth 2 (two) times daily.     . pantoprazole (PROTONIX) 40 MG tablet Take 40 mg by mouth 2 (two) times daily.     No current facility-administered medications for this visit.      Physical Exam  Blood pressure 136/75, pulse (!) 121, height 5\' 8"  (1.727 m), weight 162 lb (73.5 kg).  Constitutional: overall normal hygiene, normal nutrition, well developed, normal grooming, normal body habitus. Assistive device:none  Musculoskeletal: gait and station Limp none, muscle tone and strength are normal, no tremors or atrophy is present.  .  Neurological: coordination overall normal.  Deep tendon reflex/nerve stretch intact.  Sensation normal.  Cranial nerves II-XII intact.   Skin:   Normal overall no scars, lesions, ulcers or rashes. No psoriasis.  Psychiatric: Alert and oriented x 3.  Recent memory intact, remote memory unclear.  Normal mood and affect. Well groomed.  Good eye contact.  Cardiovascular: overall no swelling, no varicosities, no edema bilaterally, normal temperatures of the legs and arms, no clubbing, cyanosis and good capillary  refill.  Lymphatic: palpation is normal.  Examination of right Upper Extremity is done.  Inspection:   Overall:  Elbow non-tender without crepitus or defects, forearm non-tender without crepitus or defects, wrist non-tender without crepitus or defects, hand non-tender.    Shoulder: with glenohumeral joint tenderness, without effusion.   Upper arm: without swelling and tenderness   Range of motion:   Overall:  Full range of motion of the elbow, full range of motion of wrist and full range of motion in fingers.   Shoulder:  right  150 degrees forward flexion; 135 degrees abduction; 30 degrees internal rotation, 30 degrees external rotation, 15 degrees  extension, 40 degrees adduction.   Stability:   Overall:  Shoulder, elbow and wrist stable   Strength and Tone:   Overall full shoulder muscles strength, full upper arm strength and normal upper arm bulk and tone.  All other systems reviewed and are negative   The patient has been educated about the nature of the problem(s) and counseled on treatment options.  The patient appeared to understand what I have discussed and is in agreement with it.  X-rays were done of the right shoulder, reported separately.  Encounter Diagnoses  Name Primary?  . Pain in joint of right shoulder Yes  . Cigarette nicotine dependence without complication   . Essential hypertension   . Peripheral arterial disease ([place])     PROCEDURE NOTE:  The patient request injection, verbal consent was obtained.  The right shoulder was prepped appropriately after time out was performed.   Sterile technique was observed and injection of 1 cc of Depo-Medrol 40 mg with several cc's of plain xylocaine. Anesthesia was provided by ethyl chloride and a 20-gauge needle was used to inject the shoulder area. A posterior approach was used.  The injection was tolerated well.  A band aid dressing was applied.  The patient was advised to apply ice later today and tomorrow to the  injection sight as needed.   PLAN Call if any problems.  Precautions discussed.  Begin Aleve one tablet twice a day after eating.  Return to clinic 2 weeks  The patient was counseled about smoking and smoking cessation.  I spent about three to five minutes in doing this.  I, of course, determined the patient does smoke and frequency.  I have advised the patient of problems medically that can occur with continued smoking including poor wound and bone healing as well as the obvious respiratory problems.  I have assessed the patient's willingness to cut back and quit smoking.  The patient has stated he is willing to cut back and try to stop again.  I have told the patient to discuss with their family doctor also about smoking cessation.  I am willing to assist my patient in arranging this and to get additional help.I have suggested the patient have a set date to try to begin cutting back.  I have printed out a smoking cessation form about how to begin cutting back and suggestions.  I will discuss this further when I see the patient in the future.  I have stressed that although this will be very difficult to accomplish, the benefits are very substantial and will give long term health benefits from now on.   Electronically Signed Sanjuana Kava, MD 1/8/20192:21 PM

## 2017-11-07 NOTE — Patient Instructions (Signed)
Steps to Quit Smoking Smoking tobacco can be bad for your health. It can also affect almost every organ in your body. Smoking puts you and people around you at risk for many serious long-lasting (chronic) diseases. Quitting smoking is hard, but it is one of the best things that you can do for your health. It is never too late to quit. What are the benefits of quitting smoking? When you quit smoking, you lower your risk for getting serious diseases and conditions. They can include:  Lung cancer or lung disease.  Heart disease.  Stroke.  Heart attack.  Not being able to have children (infertility).  Weak bones (osteoporosis) and broken bones (fractures).  If you have coughing, wheezing, and shortness of breath, those symptoms may get better when you quit. You may also get sick less often. If you are pregnant, quitting smoking can help to lower your chances of having a baby of low birth weight. What can I do to help me quit smoking? Talk with your doctor about what can help you quit smoking. Some things you can do (strategies) include:  Quitting smoking totally, instead of slowly cutting back how much you smoke over a period of time.  Going to in-person counseling. You are more likely to quit if you go to many counseling sessions.  Using resources and support systems, such as: ? Online chats with a counselor. ? Phone quitlines. ? Printed self-help materials. ? Support groups or group counseling. ? Text messaging programs. ? Mobile phone apps or applications.  Taking medicines. Some of these medicines may have nicotine in them. If you are pregnant or breastfeeding, do not take any medicines to quit smoking unless your doctor says it is okay. Talk with your doctor about counseling or other things that can help you.  Talk with your doctor about using more than one strategy at the same time, such as taking medicines while you are also going to in-person counseling. This can help make  quitting easier. What things can I do to make it easier to quit? Quitting smoking might feel very hard at first, but there is a lot that you can do to make it easier. Take these steps:  Talk to your family and friends. Ask them to support and encourage you.  Call phone quitlines, reach out to support groups, or work with a counselor.  Ask people who smoke to not smoke around you.  Avoid places that make you want (trigger) to smoke, such as: ? Bars. ? Parties. ? Smoke-break areas at work.  Spend time with people who do not smoke.  Lower the stress in your life. Stress can make you want to smoke. Try these things to help your stress: ? Getting regular exercise. ? Deep-breathing exercises. ? Yoga. ? Meditating. ? Doing a body scan. To do this, close your eyes, focus on one area of your body at a time from head to toe, and notice which parts of your body are tense. Try to relax the muscles in those areas.  Download or buy apps on your mobile phone or tablet that can help you stick to your quit plan. There are many free apps, such as QuitGuide from the CDC (Centers for Disease Control and Prevention). You can find more support from smokefree.gov and other websites.  This information is not intended to replace advice given to you by your health care provider. Make sure you discuss any questions you have with your health care provider. Document Released: 08/13/2009 Document   Revised: 06/14/2016 Document Reviewed: 03/03/2015 Elsevier Interactive Patient Education  2018 [employer]. Shoulder Exercises Ask your health care provider which exercises are safe for you. Do exercises exactly as told by your health care provider and adjust them as directed. It is normal to feel mild stretching, pulling, tightness, or discomfort as you do these exercises, but you should stop right away if you feel sudden pain or your pain gets worse.Do not begin these exercises until told by your health care  provider. RANGE OF MOTION EXERCISES These exercises warm up your muscles and joints and improve the movement and flexibility of your shoulder. These exercises also help to relieve pain, numbness, and tingling. These exercises involve stretching your injured shoulder directly. Exercise A: Pendulum  1. Stand near a wall or a surface that you can hold onto for balance. 2. Bend at the waist and let your left / right arm hang straight down. Use your other arm to support you. Keep your back straight and do not lock your knees. 3. Relax your left / right arm and shoulder muscles, and move your hips and your trunk so your left / right arm swings freely. Your arm should swing because of the motion of your body, not because you are using your arm or shoulder muscles. 4. Keep moving your body so your arm swings in the following directions, as told by your health care provider: ? Side to side. ? Forward and backward. ? In clockwise and counterclockwise circles. 5. Continue each motion for __________ seconds, or for as long as told by your health care provider. 6. Slowly return to the starting position. Repeat __________ times. Complete this exercise __________ times a day. Exercise B:Flexion, Standing  1. Stand and hold a broomstick, a cane, or a similar object. Place your hands a little more than shoulder-width apart on the object. Your left / right hand should be palm-up, and your other hand should be palm-down. 2. Keep your elbow straight and keep your shoulder muscles relaxed. Push the stick down with your healthy arm to raise your left / right arm in front of your body, and then over your head until you feel a stretch in your shoulder. ? Avoid shrugging your shoulder while you raise your arm. Keep your shoulder blade tucked down toward the middle of your back. 3. Hold for __________ seconds. 4. Slowly return to the starting position. Repeat __________ times. Complete this exercise __________ times a  day. Exercise C: Abduction, Standing 1. Stand and hold a broomstick, a cane, or a similar object. Place your hands a little more than shoulder-width apart on the object. Your left / right hand should be palm-up, and your other hand should be palm-down. 2. While keeping your elbow straight and your shoulder muscles relaxed, push the stick across your body toward your left / right side. Raise your left / right arm to the side of your body and then over your head until you feel a stretch in your shoulder. ? Do not raise your arm above shoulder height, unless your health care provider tells you to do that. ? Avoid shrugging your shoulder while you raise your arm. Keep your shoulder blade tucked down toward the middle of your back. 3. Hold for __________ seconds. 4. Slowly return to the starting position. Repeat __________ times. Complete this exercise __________ times a day. Exercise D:Internal Rotation  1. Place your left / right hand behind your back, palm-up. 2. Use your other hand to dangle an exercise  band, a towel, or a similar object over your shoulder. Grasp the band with your left / right hand so you are holding onto both ends. 3. Gently pull up on the band until you feel a stretch in the front of your left / right shoulder. ? Avoid shrugging your shoulder while you raise your arm. Keep your shoulder blade tucked down toward the middle of your back. 4. Hold for __________ seconds. 5. Release the stretch by letting go of the band and lowering your hands. Repeat __________ times. Complete this exercise __________ times a day. STRETCHING EXERCISES These exercises warm up your muscles and joints and improve the movement and flexibility of your shoulder. These exercises also help to relieve pain, numbness, and tingling. These exercises are done using your healthy shoulder to help stretch the muscles of your injured shoulder. Exercise E: Warehouse manager (External Rotation and  Abduction)  1. Stand in a doorway with one of your feet slightly in front of the other. This is called a staggered stance. If you cannot reach your forearms to the door frame, stand facing a corner of a room. 2. Choose one of the following positions as told by your health care provider: ? Place your hands and forearms on the door frame above your head. ? Place your hands and forearms on the door frame at the height of your head. ? Place your hands on the door frame at the height of your elbows. 3. Slowly move your weight onto your front foot until you feel a stretch across your chest and in the front of your shoulders. Keep your head and chest upright and keep your abdominal muscles tight. 4. Hold for __________ seconds. 5. To release the stretch, shift your weight to your back foot. Repeat __________ times. Complete this stretch __________ times a day. Exercise F:Extension, Standing 1. Stand and hold a broomstick, a cane, or a similar object behind your back. ? Your hands should be a little wider than shoulder-width apart. ? Your palms should face away from your back. 2. Keeping your elbows straight and keeping your shoulder muscles relaxed, move the stick away from your body until you feel a stretch in your shoulder. ? Avoid shrugging your shoulders while you move the stick. Keep your shoulder blade tucked down toward the middle of your back. 3. Hold for __________ seconds. 4. Slowly return to the starting position. Repeat __________ times. Complete this exercise __________ times a day. STRENGTHENING EXERCISES These exercises build strength and endurance in your shoulder. Endurance is the ability to use your muscles for a long time, even after they get tired. Exercise G:External Rotation  1. Sit in a stable chair without armrests. 2. Secure an exercise band at elbow height on your left / right side. 3. Place a soft object, such as a folded towel or a small pillow, between your left /  right upper arm and your body to move your elbow a few inches away (about 10 cm) from your side. 4. Hold the end of the band so it is tight and there is no slack. 5. Keeping your elbow pressed against the soft object, move your left / right forearm out, away from your abdomen. Keep your body steady so only your forearm moves. 6. Hold for __________ seconds. 7. Slowly return to the starting position. Repeat __________ times. Complete this exercise __________ times a day. Exercise H:Shoulder Abduction  1. Sit in a stable chair without armrests, or stand. 2. Hold a __________ weight  in your left / right hand, or hold an exercise band with both hands. 3. Start with your arms straight down and your left / right palm facing in, toward your body. 4. Slowly lift your left / right hand out to your side. Do not lift your hand above shoulder height unless your health care provider tells you that this is safe. ? Keep your arms straight. ? Avoid shrugging your shoulder while you do this movement. Keep your shoulder blade tucked down toward the middle of your back. 5. Hold for __________ seconds. 6. Slowly lower your arm, and return to the starting position. Repeat __________ times. Complete this exercise __________ times a day. Exercise I:Shoulder Extension 1. Sit in a stable chair without armrests, or stand. 2. Secure an exercise band to a stable object in front of you where it is at shoulder height. 3. Hold one end of the exercise band in each hand. Your palms should face each other. 4. Straighten your elbows and lift your hands up to shoulder height. 5. Step back, away from the secured end of the exercise band, until the band is tight and there is no slack. 6. Squeeze your shoulder blades together as you pull your hands down to the sides of your thighs. Stop when your hands are straight down by your sides. Do not let your hands go behind your body. 7. Hold for __________ seconds. 8. Slowly return to  the starting position. Repeat __________ times. Complete this exercise __________ times a day. Exercise J:Standing Shoulder Row 1. Sit in a stable chair without armrests, or stand. 2. Secure an exercise band to a stable object in front of you so it is at waist height. 3. Hold one end of the exercise band in each hand. Your palms should be in a thumbs-up position. 4. Bend each of your elbows to an "L" shape (about 90 degrees) and keep your upper arms at your sides. 5. Step back until the band is tight and there is no slack. 6. Slowly pull your elbows back behind you. 7. Hold for __________ seconds. 8. Slowly return to the starting position. Repeat __________ times. Complete this exercise __________ times a day. Exercise K:Shoulder Press-Ups  1. Sit in a stable chair that has armrests. Sit upright, with your feet flat on the floor. 2. Put your hands on the armrests so your elbows are bent and your fingers are pointing forward. Your hands should be about even with the sides of your body. 3. Push down on the armrests and use your arms to lift yourself off of the chair. Straighten your elbows and lift yourself up as much as you comfortably can. ? Move your shoulder blades down, and avoid letting your shoulders move up toward your ears. ? Keep your feet on the ground. As you get stronger, your feet should support less of your body weight as you lift yourself up. 4. Hold for __________ seconds. 5. Slowly lower yourself back into the chair. Repeat __________ times. Complete this exercise __________ times a day. Exercise L: Wall Push-Ups  1. Stand so you are facing a stable wall. Your feet should be about one arm-length away from the wall. 2. Lean forward and place your palms on the wall at shoulder height. 3. Keep your feet flat on the floor as you bend your elbows and lean forward toward the wall. 4. Hold for __________ seconds. 5. Straighten your elbows to push yourself back to the starting  position. Repeat __________ times. Complete  this exercise __________ times a day. This information is not intended to replace advice given to you by your health care provider. Make sure you discuss any questions you have with your health care provider. Document Released: 08/31/2005 Document Revised: 07/11/2016 Document Reviewed: 06/28/2015 Elsevier Interactive Patient Education  2018 [employer].

## 2017-11-10 ENCOUNTER — Other Ambulatory Visit: Payer: Self-pay | Admitting: Emergency Medicine

## 2017-11-10 DIAGNOSIS — Z720 Tobacco use: Secondary | ICD-10-CM

## 2017-11-10 DIAGNOSIS — J431 Panlobular emphysema: Secondary | ICD-10-CM

## 2017-11-10 DIAGNOSIS — R0781 Pleurodynia: Secondary | ICD-10-CM

## 2017-11-10 DIAGNOSIS — C61 Malignant neoplasm of prostate: Secondary | ICD-10-CM

## 2017-11-17 ENCOUNTER — Ambulatory Visit: Payer: Medicare Other | Admitting: Urology

## 2017-11-20 ENCOUNTER — Ambulatory Visit
Admission: RE | Admit: 2017-11-20 | Discharge: 2017-11-20 | Disposition: A | Discharge status: Home / Self Care | Payer: Medicare Other | Source: Ambulatory Visit | Attending: Emergency Medicine | Admitting: Emergency Medicine

## 2017-11-20 DIAGNOSIS — R0781 Pleurodynia: Secondary | ICD-10-CM

## 2017-11-20 DIAGNOSIS — Z720 Tobacco use: Secondary | ICD-10-CM

## 2017-11-20 DIAGNOSIS — C61 Malignant neoplasm of prostate: Secondary | ICD-10-CM

## 2017-11-20 DIAGNOSIS — J431 Panlobular emphysema: Secondary | ICD-10-CM

## 2017-11-20 MED ORDER — IOPAMIDOL (ISOVUE-300) INJECTION 61%
75.0000 mL | Freq: Once | INTRAVENOUS | Status: AC | PRN
  Administered 2017-11-20: 75 mL via INTRAVENOUS

## 2017-11-21 ENCOUNTER — Ambulatory Visit (INDEPENDENT_AMBULATORY_CARE_PROVIDER_SITE_OTHER): Payer: Medicare Other | Admitting: Orthopaedic Surgery

## 2017-11-21 ENCOUNTER — Encounter: Payer: Self-pay | Admitting: Orthopaedic Surgery

## 2017-11-21 VITALS — BP 124/80 | HR 88 | Ht 68.0 in | Wt 162.0 lb

## 2017-11-21 DIAGNOSIS — G8929 Other chronic pain: Secondary | ICD-10-CM

## 2017-11-21 DIAGNOSIS — M25511 Pain in right shoulder: Secondary | ICD-10-CM

## 2017-11-21 MED ORDER — HYDROCODONE-ACETAMINOPHEN 5-325 MG PO TABS: ORAL_TABLET | ORAL | 0 refills | Status: DC

## 2017-11-21 NOTE — Patient Instructions (Signed)

## 2017-11-21 NOTE — Progress Notes (Signed)
Patient Fred Hernandez, male DOB:13-Jul-1956, 62 y.o. YSA:[id number]  Chief Complaint  Patient presents with  . Follow-up    Right Shoulder    HPI  Fred Hernandez is a 62 y.o. male who has continued pain of the right shoulder.  The injection last visit only lasted about four days.  He has pain with overhead use.  He has seen his family doctor about this in October of last year and more recently this month.  He has no trauma, no paresthesias.  I will get MRI of the shoulder as I am concerned about rotator cuff injury.  He smokes but has cut back.  HPI  Body mass index is 24.63 kg/m.  ROS  Review of Systems  Constitutional:       Patient has Diabetes Mellitus. Patient has hypertension. Patient has COPD or shortness of breath. Patient does not have BMI > 35. Patient has current smoking history  HENT: Negative for congestion.   Cardiovascular: Negative for chest pain.  Endocrine: Positive for cold intolerance.  Musculoskeletal: Positive for arthralgias and myalgias.  Allergic/Immunologic: Positive for environmental allergies.  Neurological: Negative for numbness.  All other systems reviewed and are negative.   Past Medical History:  Diagnosis Date  . CAD (coronary artery disease)     s/p stent placement unknown artery 12/2014,  . Diabetes mellitus ([place])   . GERD (gastroesophageal reflux disease)   . History of prostate cancer   . Hyperlipemia   . Hypertension   . Peripheral arterial disease ([place])    a. h/o stents. b. 03/2016  - successful PTA and covered stenting using overlapping Viabahn covered stents of a long segment in-stent restenosis of previously placed nitinol self expanding stents in the proximal mid and distal right SFA.  . Tobacco abuse     Past Surgical History:  Procedure Laterality Date  . COLONOSCOPY WITH PROPOFOL N/A 11/22/2016   Procedure: COLONOSCOPY WITH PROPOFOL;  Surgeon: [name], MD;  [facility];  Service: Endoscopy;  Laterality:  N/A;  10:00 am  . CORONARY STENT PLACEMENT  12-2014   [facility] [employer]  . FEMORAL ARTERY STENT Bilateral 03/2013  . PERIPHERAL VASCULAR CATHETERIZATION N/A 04/04/2016   Procedure: Lower Extremity Angiography;  Surgeon: Lorretta Harp, MD;  Location: [place] [facility];  Service: Cardiovascular;  Laterality: N/A;  . PERIPHERAL VASCULAR CATHETERIZATION N/A 04/04/2016   Procedure: Abdominal Aortogram;  Surgeon: Lorretta Harp, MD;  Location: [place] [facility];  Service: Cardiovascular;  Laterality: N/A;  . PERIPHERAL VASCULAR CATHETERIZATION  04/04/2016   Procedure: Peripheral Vascular Intervention;  Surgeon: Lorretta Harp, MD;  Location: [place] [facility];  Service: Cardiovascular;;  rt SFA stent  . POLYPECTOMY  11/22/2016   Procedure: POLYPECTOMY;  Surgeon: [name], MD;  [facility];  Service: Endoscopy;;  descending colon polyp, transverse colon polyp, sigmoid colon polyp, rectal polyp  . STENT PLACEMENT VASCULAR ([place] [facility]X)  2013    Family History  Problem Relation Age of Onset  . Diabetes Mother   . Heart disease Mother   . Hypertension Mother   . Healthy Brother   . Other Brother        accident   . Arthritis Sister   . Arthritis Sister   . SIDS Brother   . Colon cancer Neg Hx     Social History Social History   Tobacco Use  . Smoking status: Current Every Day Smoker    Packs/day: 1.00  Types: Cigarettes    Start date: 02/12/1971  . Smokeless tobacco: Never Used  Substance Use Topics  . Alcohol use: Yes    Alcohol/week: 0.0 oz    Comment: Socially: beer or liquor about 3 times a week; occasional binge.  . Drug use: No    Allergies  Allergen Reactions  . Lisinopril Anaphylaxis  . Penicillins Anaphylaxis    Has patient had a PCN reaction causing immediate rash, facial/tongue/throat swelling, SOB or lightheadedness with hypotension:Yes Has patient had a PCN reaction causing severe rash involving mucus membranes or skin  necrosis:unsure Has patient had a PCN reaction that required hospitalization:unsure Has patient had a PCN reaction occurring within the last 10 years:~10 years per patient If all of the above answers are "NO", then may proceed with Cephalosporin use.   . Ace Inhibitors Swelling    Current Outpatient Medications  Medication Sig Dispense Refill  . aspirin 81 MG chewable tablet Chew 81 mg by mouth daily.    [name] atorvastatin (LIPITOR) 80 MG tablet Take 80 mg by mouth at bedtime.     [name] CALCIUM-MAGNESIUM PO Take 1 tablet by mouth at bedtime.    . cilostazol (PLETAL) 50 MG tablet TAKE 1 TABLET(50 MG) BY MOUTH TWICE DAILY 60 tablet 6  . co-enzyme Q-10 30 MG capsule Take 30 mg by mouth at bedtime.     Mariane Baumgarten Sodium (COLACE PO) Take 1 tablet by mouth daily.    [name] FLOVENT HFA 110 MCG/ACT inhaler Inhale 1 puff into the lungs 2 (two) times daily.  2  . fluticasone (FLONASE) 50 MCG/ACT nasal spray Place 1 spray into both nostrils daily.    [name] ipratropium-albuterol (DUONEB) 0.5-2.5 (3) MG/3ML SOLN Take 3 mLs by nebulization every 6 (six) hours as needed (for shortness of breath).    [name] levofloxacin (LEVAQUIN) 750 MG tablet TK 1 T PO QD FOR 10 DAYS  0  . metFORMIN (GLUCOPHAGE) 500 MG tablet Take 500 mg by mouth 2 (two) times daily.     . metoprolol succinate (TOPROL-XL) 50 MG 24 hr tablet Take 50 mg by mouth at bedtime.     . Omega-3 Fatty Acids (FISH OIL) 1000 MG CAPS Take 1 capsule by mouth 2 (two) times daily.     . pantoprazole (PROTONIX) 40 MG tablet Take 40 mg by mouth 2 (two) times daily.     No current facility-administered medications for this visit.      Physical Exam  Blood pressure 124/80, pulse 88, height 5\' 8"  (1.727 m), weight 162 lb (73.5 kg).  Constitutional: overall normal hygiene, normal nutrition, well developed, normal grooming, normal body habitus. Assistive device:none  Musculoskeletal: gait and station Limp none, muscle tone and strength are normal, no tremors or atrophy is  present.  .  Neurological: coordination overall normal.  Deep tendon reflex/nerve stretch intact.  Sensation normal.  Cranial nerves II-XII intact.   Skin:   Normal overall no scars, lesions, ulcers or rashes. No psoriasis.  Psychiatric: Alert and oriented x 3.  Recent memory intact, remote memory unclear.  Normal mood and affect. Well groomed.  Good eye contact.  Cardiovascular: overall no swelling, no varicosities, no edema bilaterally, normal temperatures of the legs and arms, no clubbing, cyanosis and good capillary refill.  Lymphatic: palpation is normal.  Examination of right Upper Extremity is done.  Inspection:   Overall:  Elbow non-tender without crepitus or defects, forearm non-tender without crepitus or defects, wrist non-tender without crepitus or defects, hand non-tender.  Shoulder: with glenohumeral joint tenderness, without effusion.   Upper arm: without swelling and tenderness   Range of motion:   Overall:  Full range of motion of the elbow, full range of motion of wrist and full range of motion in fingers.   Shoulder:  right  165 degrees forward flexion; 150 degrees abduction; 35 degrees internal rotation, 35 degrees external rotation, 15 degrees extension, 40 degrees adduction.   Stability:   Overall:  Shoulder, elbow and wrist stable   Strength and Tone:   Overall full shoulder muscles strength, full upper arm strength and normal upper arm bulk and tone.  All other systems reviewed and are negative   The patient has been educated about the nature of the problem(s) and counseled on treatment options.  The patient appeared to understand what I have discussed and is in agreement with it.  Encounter Diagnoses  Name Primary?  . Pain in joint of right shoulder Yes  . Chronic right shoulder pain      PLAN Call if any problems.  Precautions discussed.  Continue current medications.   Return to clinic get MRI of the right shoulder.  I have reviewed the [place] web site prior to prescribing narcotic medicine for this patient.  Electronically Signed Sanjuana Kava, MD 1/22/20199:41 AM

## 2017-11-30 ENCOUNTER — Other Ambulatory Visit (HOSPITAL_COMMUNITY): Payer: Self-pay | Admitting: Adult Health

## 2017-11-30 ENCOUNTER — Encounter (HOSPITAL_COMMUNITY): Payer: Self-pay | Admitting: Adult Health

## 2017-11-30 DIAGNOSIS — R918 Other nonspecific abnormal finding of lung field: Secondary | ICD-10-CM

## 2017-11-30 DIAGNOSIS — R9389 Abnormal findings on diagnostic imaging of other specified body structures: Secondary | ICD-10-CM

## 2017-11-30 NOTE — Progress Notes (Signed)
[facility] received new patient consult for Fred Hernandez for new lung mass, concerning for malignancy.   CT chest from 11/20/17 reviewed.  4.7 cm mass appreciated in apex of (R) lung.  I called and spoke with Dr. Luan Pulling with pulmonology; he reviewed the patient's images and case with me via phone.  Per Dr. Luan Pulling, the mass is not amenable to biopsy via bronchoscopy. He thinks IR would be the best intervention to obtain tissue diagnosis of this lung mass.   Inbasket message sent to Dr. Jacqulynn Cadet with IR. Awaiting reply.    Orders placed for PET scan and is scheduled for 12/13/17. He will see [place] for new patient visit on 12/14/17.     Mike Craze, NP [place] [phone]

## 2017-12-01 ENCOUNTER — Ambulatory Visit: Payer: Medicare Other | Admitting: Urology

## 2017-12-01 ENCOUNTER — Ambulatory Visit (INDEPENDENT_AMBULATORY_CARE_PROVIDER_SITE_OTHER): Payer: Medicare Other | Admitting: Urology

## 2017-12-01 DIAGNOSIS — C3490 Malignant neoplasm of unspecified part of unspecified bronchus or lung: Secondary | ICD-10-CM

## 2017-12-01 DIAGNOSIS — R972 Elevated prostate specific antigen [PSA]: Secondary | ICD-10-CM | POA: Diagnosis not present

## 2017-12-01 HISTORY — DX: Malignant neoplasm of unspecified part of unspecified bronchus or lung: C34.90

## 2017-12-04 ENCOUNTER — Telehealth: Payer: Self-pay | Admitting: *Deleted

## 2017-12-04 ENCOUNTER — Ambulatory Visit (INDEPENDENT_AMBULATORY_CARE_PROVIDER_SITE_OTHER): Payer: Medicare Other | Admitting: Otolaryngology

## 2017-12-04 DIAGNOSIS — H903 Sensorineural hearing loss, bilateral: Secondary | ICD-10-CM

## 2017-12-04 NOTE — Telephone Encounter (Signed)
    [facility] Pre-operative Risk Assessment    Request for surgical clearance:  1. What type of surgery is being performed? PROSTATE BIOPSY    2. When is this surgery scheduled? 01/31/18   3. What type of clearance is required (medical clearance vs. Pharmacy clearance to hold med vs. Both)? BOTH  4. Are there any medications that need to be held prior to surgery and how long?CLOPIDIGREL 7 DAYS PRIOR   5. Practice name and name of physician performing surgery? [facility], [facility]    6. What is your office phone and fax number? PHONE [phone] FAX [phone]   7. Anesthesia type (None, local, MAC, general) ? UNKNOWN

## 2017-12-05 ENCOUNTER — Ambulatory Visit (INDEPENDENT_AMBULATORY_CARE_PROVIDER_SITE_OTHER): Payer: Medicare Other | Admitting: Orthopaedic Surgery

## 2017-12-05 ENCOUNTER — Encounter (HOSPITAL_COMMUNITY): Payer: Self-pay | Admitting: Adult Health

## 2017-12-05 ENCOUNTER — Encounter: Payer: Self-pay | Admitting: Orthopaedic Surgery

## 2017-12-05 ENCOUNTER — Other Ambulatory Visit: Payer: Self-pay | Admitting: Urology

## 2017-12-05 VITALS — BP 143/70 | HR 115 | Temp 98.3°F | Ht 68.0 in | Wt 156.0 lb

## 2017-12-05 DIAGNOSIS — R972 Elevated prostate specific antigen [PSA]: Secondary | ICD-10-CM

## 2017-12-05 DIAGNOSIS — M25511 Pain in right shoulder: Secondary | ICD-10-CM | POA: Diagnosis not present

## 2017-12-05 NOTE — Telephone Encounter (Signed)
  Pre-operative Risk Assessment - Provider Statement    Patient ID:  Fred Hernandez, DOB: 07/22/1956, MRN: [id number]   Mr. Wierzba's chart has been reviewed for pre-operative risk assessment. Because of this patient's past medical history and time since last visit, a follow-up visit is required in order to better asses peri-operative cardiovascular risk.  It will be 6 months since last seen by his primary cardiologist when he needs to undergo his procedure.  Pre-Op Covering Staff:   (1) Please schedule an appointment with Dr. Bronson Ing by early March 2019 and notify patient.    (2) Please contact surgeon or referring provider via preferred method (phone, fax, etc) to notify them of the need for an office visit prior to clearance.   I will also forward this to Dr. Gwenlyn Found to get his input regarding whether or not Plavix can be held (as it pertains to his PAD), as he sees the patient for follow up later this month.    Signed,  Richardson Dopp, PA-C  12/05/2017 3:03 PM

## 2017-12-05 NOTE — Telephone Encounter (Signed)
Per Richardson Dopp, PA I called and lmtcb to schedule a Pre- op clearance appt with Dr. Bronson Ing in the [place].

## 2017-12-05 NOTE — Progress Notes (Signed)
See below for Dr. Katrinka Blazing recommendations from IR:      Plan: Will await PET scan results and then likely refer patient to [facility] for IR-guided lung biopsy.    Mike Craze, NP [place] [phone]

## 2017-12-05 NOTE — Progress Notes (Signed)
Patient Fred Hernandez, male DOB:Aug 04, 1956, 62 y.o. WIO:[id number]  Chief Complaint  Patient presents with  . Shoulder Pain    right    HPI  Fred Hernandez is a 62 y.o. male who has pain in the right shoulder.  He is just a little better after the injection last week.  He still has pain with overhead use.  He had [facility] showing no rotator cuff tear but he has supraspinatus tendinosis.  I have explained the findings to him.  He prefers to do exercises at home and return as needed.  HPI  Body mass index is 23.72 kg/m.  ROS  Review of Systems  Constitutional:       Patient has Diabetes Mellitus. Patient has hypertension. Patient has COPD or shortness of breath. Patient does not have BMI > 35. Patient has current smoking history  HENT: Negative for congestion.   Cardiovascular: Negative for chest pain.  Endocrine: Positive for cold intolerance.  Musculoskeletal: Positive for arthralgias and myalgias.  Allergic/Immunologic: Positive for environmental allergies.  Neurological: Negative for numbness.  All other systems reviewed and are negative.   Past Medical History:  Diagnosis Date  . CAD (coronary artery disease)     s/p stent placement unknown artery 12/2014,  . Diabetes mellitus ([place])   . GERD (gastroesophageal reflux disease)   . History of prostate cancer   . Hyperlipemia   . Hypertension   . Peripheral arterial disease ([place])    a. h/o stents. b. 03/2016  - successful PTA and covered stenting using overlapping Viabahn covered stents of a long segment in-stent restenosis of previously placed nitinol self expanding stents in the proximal mid and distal right SFA.  . Tobacco abuse     Past Surgical History:  Procedure Laterality Date  . COLONOSCOPY WITH PROPOFOL N/A 11/22/2016   Procedure: COLONOSCOPY WITH PROPOFOL;  Surgeon: [name], MD;  [facility];  Service: Endoscopy;  Laterality: N/A;  10:00 am  . CORONARY STENT PLACEMENT  12-2014   [facility] [employer]  . FEMORAL ARTERY STENT Bilateral 03/2013  . PERIPHERAL VASCULAR CATHETERIZATION N/A 04/04/2016   Procedure: Lower Extremity Angiography;  Surgeon: Lorretta Harp, MD;  Location: [place] [facility];  Service: Cardiovascular;  Laterality: N/A;  . PERIPHERAL VASCULAR CATHETERIZATION N/A 04/04/2016   Procedure: Abdominal Aortogram;  Surgeon: Lorretta Harp, MD;  Location: [place] [facility];  Service: Cardiovascular;  Laterality: N/A;  . PERIPHERAL VASCULAR CATHETERIZATION  04/04/2016   Procedure: Peripheral Vascular Intervention;  Surgeon: Lorretta Harp, MD;  Location: [place] [facility];  Service: Cardiovascular;;  rt SFA stent  . POLYPECTOMY  11/22/2016   Procedure: POLYPECTOMY;  Surgeon: [name], MD;  [facility];  Service: Endoscopy;;  descending colon polyp, transverse colon polyp, sigmoid colon polyp, rectal polyp  . STENT PLACEMENT VASCULAR ([place] HX)  2013    Family History  Problem Relation Age of Onset  . Diabetes Mother   . Heart disease Mother   . Hypertension Mother   . Healthy Brother   . Other Brother        accident   . Arthritis Sister   . Arthritis Sister   . SIDS Brother   . Colon cancer Neg Hx     Social History Social History   Tobacco Use  . Smoking status: Current Every Day Smoker    Packs/day: 1.00    Types: Cigarettes    Start date: 02/12/1971  .  Smokeless tobacco: Never Used  Substance Use Topics  . Alcohol use: Yes    Alcohol/week: 0.0 oz    Comment: Socially: beer or liquor about 3 times a week; occasional binge.  . Drug use: No    Allergies  Allergen Reactions  . Lisinopril Anaphylaxis  . Penicillins Anaphylaxis    Has patient had a PCN reaction causing immediate rash, facial/tongue/throat swelling, SOB or lightheadedness with hypotension:Yes Has patient had a PCN reaction causing severe rash involving mucus membranes or skin necrosis:unsure Has patient had a PCN reaction that required  hospitalization:unsure Has patient had a PCN reaction occurring within the last 10 years:~10 years per patient If all of the above answers are "NO", then may proceed with Cephalosporin use.   . Ace Inhibitors Swelling    Current Outpatient Medications  Medication Sig Dispense Refill  . aspirin 81 MG chewable tablet Chew 81 mg by mouth daily.    [name] atorvastatin (LIPITOR) 80 MG tablet Take 80 mg by mouth at bedtime.     [name] CALCIUM-MAGNESIUM PO Take 1 tablet by mouth at bedtime.    . cilostazol (PLETAL) 50 MG tablet TAKE 1 TABLET(50 MG) BY MOUTH TWICE DAILY 60 tablet 6  . co-enzyme Q-10 30 MG capsule Take 30 mg by mouth at bedtime.     Mariane Baumgarten Sodium (COLACE PO) Take 1 tablet by mouth daily.    [name] FLOVENT HFA 110 MCG/ACT inhaler Inhale 1 puff into the lungs 2 (two) times daily.  2  . fluticasone (FLONASE) 50 MCG/ACT nasal spray Place 1 spray into both nostrils daily.    [name] HYDROcodone-acetaminophen (NORCO/VICODIN) 5-325 MG tablet One tablet every four hours as needed for acute pain.  Limit of five days per [place] statue. 30 tablet 0  . ipratropium-albuterol (DUONEB) 0.5-2.5 (3) MG/3ML SOLN Take 3 mLs by nebulization every 6 (six) hours as needed (for shortness of breath).    [name] levofloxacin (LEVAQUIN) 750 MG tablet TK 1 T PO QD FOR 10 DAYS  0  . metFORMIN (GLUCOPHAGE) 500 MG tablet Take 500 mg by mouth 2 (two) times daily.     . metoprolol succinate (TOPROL-XL) 50 MG 24 hr tablet Take 50 mg by mouth at bedtime.     . Omega-3 Fatty Acids (FISH OIL) 1000 MG CAPS Take 1 capsule by mouth 2 (two) times daily.     . pantoprazole (PROTONIX) 40 MG tablet Take 40 mg by mouth 2 (two) times daily.     No current facility-administered medications for this visit.      Physical Exam  Blood pressure (!) 143/70, pulse (!) 115, temperature 98.3 F (36.8 C), height 5\' 8"  (1.727 m), weight 156 lb (70.8 kg).  Constitutional: overall normal hygiene, normal nutrition, well developed, normal grooming, normal  body habitus. Assistive device:none  Musculoskeletal: gait and station Limp none, muscle tone and strength are normal, no tremors or atrophy is present.  .  Neurological: coordination overall normal.  Deep tendon reflex/nerve stretch intact.  Sensation normal.  Cranial nerves II-XII intact.   Skin:   Normal overall no scars, lesions, ulcers or rashes. No psoriasis.  Psychiatric: Alert and oriented x 3.  Recent memory intact, remote memory unclear.  Normal mood and affect. Well groomed.  Good eye contact.  Cardiovascular: overall no swelling, no varicosities, no edema bilaterally, normal temperatures of the legs and arms, no clubbing, cyanosis and good capillary refill.  Lymphatic: palpation is normal.  Examination of right Upper Extremity is done.  Inspection:  Overall:  Elbow non-tender without crepitus or defects, forearm non-tender without crepitus or defects, wrist non-tender without crepitus or defects, hand non-tender.    Shoulder: with glenohumeral joint tenderness, without effusion.   Upper arm: without swelling and tenderness   Range of motion:   Overall:  Full range of motion of the elbow, full range of motion of wrist and full range of motion in fingers.   Shoulder:  right  170 degrees forward flexion; 165 degrees abduction; 35 degrees internal rotation, 35 degrees external rotation, 15 degrees extension, 40 degrees adduction.   Stability:   Overall:  Shoulder, elbow and wrist stable   Strength and Tone:   Overall full shoulder muscles strength, full upper arm strength and normal upper arm bulk and tone.  All other systems reviewed and are negative   The patient has been educated about the nature of the problem(s) and counseled on treatment options.  The patient appeared to understand what I have discussed and is in agreement with it.  Encounter Diagnosis  Name Primary?  . Pain in joint of right shoulder Yes    PLAN Call if any problems.  Precautions discussed.   Continue current medications.   Return to clinic prn   Electronically Signed Sanjuana Kava, MD 2/5/20191:59 PM

## 2017-12-05 NOTE — Patient Instructions (Signed)
Steps to Quit Smoking Smoking tobacco can be bad for your health. It can also affect almost every organ in your body. Smoking puts you and people around you at risk for many serious long-lasting (chronic) diseases. Quitting smoking is hard, but it is one of the best things that you can do for your health. It is never too late to quit. What are the benefits of quitting smoking? When you quit smoking, you lower your risk for getting serious diseases and conditions. They can include:  Lung cancer or lung disease.  Heart disease.  Stroke.  Heart attack.  Not being able to have children (infertility).  Weak bones (osteoporosis) and broken bones (fractures).  If you have coughing, wheezing, and shortness of breath, those symptoms may get better when you quit. You may also get sick less often. If you are pregnant, quitting smoking can help to lower your chances of having a baby of low birth weight. What can I do to help me quit smoking? Talk with your doctor about what can help you quit smoking. Some things you can do (strategies) include:  Quitting smoking totally, instead of slowly cutting back how much you smoke over a period of time.  Going to in-person counseling. You are more likely to quit if you go to many counseling sessions.  Using resources and support systems, such as: ? Online chats with a counselor. ? Phone quitlines. ? Printed self-help materials. ? Support groups or group counseling. ? Text messaging programs. ? Mobile phone apps or applications.  Taking medicines. Some of these medicines may have nicotine in them. If you are pregnant or breastfeeding, do not take any medicines to quit smoking unless your doctor says it is okay. Talk with your doctor about counseling or other things that can help you.  Talk with your doctor about using more than one strategy at the same time, such as taking medicines while you are also going to in-person counseling. This can help make  quitting easier. What things can I do to make it easier to quit? Quitting smoking might feel very hard at first, but there is a lot that you can do to make it easier. Take these steps:  Talk to your family and friends. Ask them to support and encourage you.  Call phone quitlines, reach out to support groups, or work with a counselor.  Ask people who smoke to not smoke around you.  Avoid places that make you want (trigger) to smoke, such as: ? Bars. ? Parties. ? Smoke-break areas at work.  Spend time with people who do not smoke.  Lower the stress in your life. Stress can make you want to smoke. Try these things to help your stress: ? Getting regular exercise. ? Deep-breathing exercises. ? Yoga. ? Meditating. ? Doing a body scan. To do this, close your eyes, focus on one area of your body at a time from head to toe, and notice which parts of your body are tense. Try to relax the muscles in those areas.  Download or buy apps on your mobile phone or tablet that can help you stick to your quit plan. There are many free apps, such as QuitGuide from the CDC (Centers for Disease Control and Prevention). You can find more support from smokefree.gov and other websites.  This information is not intended to replace advice given to you by your health care provider. Make sure you discuss any questions you have with your health care provider. Document Released: 08/13/2009 Document   Revised: 06/14/2016 Document Reviewed: 03/03/2015 Elsevier Interactive Patient Education  2018 Elsevier Inc.  

## 2017-12-06 NOTE — Telephone Encounter (Signed)
Okay to interrupt his antiplatelet therapy for his prostate biopsy.

## 2017-12-07 NOTE — Telephone Encounter (Signed)
Follow up    Patient is returning call. Please call to discuss.

## 2017-12-07 NOTE — Telephone Encounter (Signed)
Attempted to call patient to schedule appointment with Dr. Bronson Ing or B. Strader, PA in [place]. Phone rang and there was no answer. At time of call, Fred Hernandez, [place] had an appt on Feb 21 @ 3:30pm

## 2017-12-07 NOTE — Telephone Encounter (Signed)
Returned call to patient. Scheduled with Maude Leriche PA on Feb 22 @ 3pm. Provided office address and phone # for [facility] in [place].   LM for [facility] surgery scheduling line that patient's clearance will be addressed at the scheduled appointment.

## 2017-12-09 ENCOUNTER — Ambulatory Visit (HOSPITAL_COMMUNITY): Payer: Medicare Other

## 2017-12-11 ENCOUNTER — Telehealth: Payer: Self-pay | Admitting: Orthopaedic Surgery

## 2017-12-11 NOTE — Telephone Encounter (Signed)
Hydrocodone-Acetaminophen  5/325 mg  Qty  30 Tablets    PATIENT USES [facility] ON [address] IN [place].

## 2017-12-12 ENCOUNTER — Ambulatory Visit (HOSPITAL_COMMUNITY): Payer: Medicare Other | Admitting: Internal Medicine

## 2017-12-12 MED ORDER — HYDROCODONE-ACETAMINOPHEN 5-325 MG PO TABS: ORAL_TABLET | ORAL | 0 refills | Status: DC

## 2017-12-13 ENCOUNTER — Ambulatory Visit (HOSPITAL_COMMUNITY)
Admission: RE | Admit: 2017-12-13 | Discharge: 2017-12-13 | Disposition: A | Discharge status: Home / Self Care | Payer: Medicare Other | Source: Ambulatory Visit | Attending: Adult Health | Admitting: Adult Health

## 2017-12-13 DIAGNOSIS — C3411 Malignant neoplasm of upper lobe, right bronchus or lung: Secondary | ICD-10-CM | POA: Diagnosis not present

## 2017-12-13 DIAGNOSIS — J320 Chronic maxillary sinusitis: Secondary | ICD-10-CM | POA: Diagnosis not present

## 2017-12-13 DIAGNOSIS — K861 Other chronic pancreatitis: Secondary | ICD-10-CM | POA: Diagnosis not present

## 2017-12-13 DIAGNOSIS — J439 Emphysema, unspecified: Secondary | ICD-10-CM | POA: Diagnosis not present

## 2017-12-13 DIAGNOSIS — I700 Atherosclerosis of aorta: Secondary | ICD-10-CM | POA: Insufficient documentation

## 2017-12-13 DIAGNOSIS — R918 Other nonspecific abnormal finding of lung field: Secondary | ICD-10-CM | POA: Insufficient documentation

## 2017-12-13 DIAGNOSIS — J948 Other specified pleural conditions: Secondary | ICD-10-CM | POA: Diagnosis not present

## 2017-12-13 DIAGNOSIS — R9389 Abnormal findings on diagnostic imaging of other specified body structures: Secondary | ICD-10-CM | POA: Diagnosis not present

## 2017-12-13 LAB — GLUCOSE, CAPILLARY: Glucose-Capillary: 118 mg/dL — ABNORMAL HIGH (ref 65–99)

## 2017-12-13 MED ORDER — FLUDEOXYGLUCOSE F - 18 (FDG) INJECTION
7.7700 | Freq: Once | INTRAVENOUS | Status: AC | PRN
  Administered 2017-12-13: 7.77 via INTRAVENOUS

## 2017-12-14 ENCOUNTER — Other Ambulatory Visit: Payer: Self-pay

## 2017-12-14 ENCOUNTER — Encounter (HOSPITAL_COMMUNITY): Payer: Self-pay | Admitting: Internal Medicine

## 2017-12-14 ENCOUNTER — Inpatient Hospital Stay (HOSPITAL_COMMUNITY): Payer: Medicare Other | Attending: Internal Medicine | Admitting: Internal Medicine

## 2017-12-14 DIAGNOSIS — M25512 Pain in left shoulder: Secondary | ICD-10-CM | POA: Insufficient documentation

## 2017-12-14 DIAGNOSIS — I10 Essential (primary) hypertension: Secondary | ICD-10-CM | POA: Diagnosis not present

## 2017-12-14 DIAGNOSIS — Z8546 Personal history of malignant neoplasm of prostate: Secondary | ICD-10-CM | POA: Insufficient documentation

## 2017-12-14 DIAGNOSIS — E785 Hyperlipidemia, unspecified: Secondary | ICD-10-CM | POA: Diagnosis not present

## 2017-12-14 DIAGNOSIS — R51 Headache: Secondary | ICD-10-CM | POA: Diagnosis not present

## 2017-12-14 DIAGNOSIS — I2510 Atherosclerotic heart disease of native coronary artery without angina pectoris: Secondary | ICD-10-CM | POA: Diagnosis not present

## 2017-12-14 DIAGNOSIS — R05 Cough: Secondary | ICD-10-CM | POA: Insufficient documentation

## 2017-12-14 DIAGNOSIS — E1159 Type 2 diabetes mellitus with other circulatory complications: Secondary | ICD-10-CM | POA: Diagnosis not present

## 2017-12-14 DIAGNOSIS — Z86010 Personal history of colonic polyps: Secondary | ICD-10-CM | POA: Insufficient documentation

## 2017-12-14 DIAGNOSIS — Z7984 Long term (current) use of oral hypoglycemic drugs: Secondary | ICD-10-CM | POA: Diagnosis not present

## 2017-12-14 DIAGNOSIS — I739 Peripheral vascular disease, unspecified: Secondary | ICD-10-CM | POA: Diagnosis not present

## 2017-12-14 DIAGNOSIS — R634 Abnormal weight loss: Secondary | ICD-10-CM | POA: Diagnosis not present

## 2017-12-14 DIAGNOSIS — F17210 Nicotine dependence, cigarettes, uncomplicated: Secondary | ICD-10-CM | POA: Insufficient documentation

## 2017-12-14 DIAGNOSIS — M899 Disorder of bone, unspecified: Secondary | ICD-10-CM | POA: Insufficient documentation

## 2017-12-14 DIAGNOSIS — Z7982 Long term (current) use of aspirin: Secondary | ICD-10-CM | POA: Diagnosis not present

## 2017-12-14 DIAGNOSIS — R911 Solitary pulmonary nodule: Secondary | ICD-10-CM | POA: Diagnosis not present

## 2017-12-14 DIAGNOSIS — Z79899 Other long term (current) drug therapy: Secondary | ICD-10-CM | POA: Diagnosis not present

## 2017-12-14 NOTE — Patient Instructions (Addendum)
[place] at [facility] Discharge Instructions  RECOMMENDATIONS MADE BY THE CONSULTANT AND ANY TEST RESULTS WILL BE SENT TO YOUR REFERRING PHYSICIAN.  You have a mass in your right lung. You will need a biopsy to find out what type of cancer this is.   We will refer you to a Cardiothoracic surgeon in [place] (Dr. Roxan Hockey) to see you and see if he can biopsy that area.   Once we know exactly what kind of cancer it is - we will bring you back into the [facility] and talk about treatment options.   We will also need to have you do MRI of the brain to make sure there is no cancer there.   PET SCAN results below ---- IMPRESSION: 1. 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 There is early bony invasion of the right second rib and cortical destruction indicating local rib/chest wall invasion by tumor. 2. There is additional pleural thickening and pleural calcification along the right posterior pleural surface which is not separately hypermetabolic. 3. Small mediastinal lymph nodes are not appreciably hypermetabolic. 4. Multifocal and some diffuse regions of high activity in the bowel are likely physiologic. 5. No findings of metastatic disease to the neck, abdomen/pelvis, or to the included remainder of the skeleton. 6. Other imaging findings of potential clinical significance: Chronic right maxillary sinusitis. Aortic Atherosclerosis (ICD10-I70.0) and Emphysema (ICD10-J43.9). Chronic calcific pancreatitis.   Return in 10-14 days to see Dr. Walden Field     Thank you for choosing [place] at [facility] to provide your oncology and hematology care.  To afford each patient quality time with our provider, please arrive at least 15 minutes before your scheduled appointment time.    If you have a lab appointment with the [facility] please come in thru  the  Main Entrance and check in at the main information desk  You need to re-schedule your appointment should you arrive 10 or more minutes late.  We strive to give you quality time with our providers, and arriving late affects you and other patients whose appointments are after yours.  Also, if you no show three or more times for appointments you may be dismissed from the clinic at the providers discretion.     Again, thank you for choosing [facility].  Our hope is that these requests will decrease the amount of time that you wait before being seen by our physicians.       _____________________________________________________________  Should you have questions after your visit to [facility], please contact our office at [phone] between the hours of 8:30 a.m. and 4:30 p.m.  Voicemails left after 4:30 p.m. will not be returned until the following business day.  For prescription refill requests, have your pharmacy contact our office.       Resources For Cancer Patients and their Caregivers ? American Cancer Society: Can assist with transportation, wigs, general needs, runs Look Good Feel Better.        551 573 1925 ? Cancer Care: Provides financial assistance, online support groups, medication/co-pay assistance.  1-800-813-HOPE 319-441-3699) ? [place] Assists [place] cancer patients and their families through emotional , educational and financial support.  (339)573-1389 ? [place] DSS Where to apply for food stamps, Medicaid and utility assistance. 336-272-8035 ? [facility]: Transportation to medical appointments. (580)558-5089 ? Social Security Administration: May apply for disability  if have a Stage IV cancer. (814)840-4896 575-337-1584 ? [employer], Disability and Transit Services: Assists with nutrition, care and transit needs. [place] Support Programs: @10RELATIVEDAYS @ > Cancer Support Group   2nd Tuesday of the month 1pm-2pm, Journey Room  > Creative Journey  3rd Tuesday of the month 1130am-1pm, Journey Room  > Look Good Feel Better  1st Wednesday of the month 10am-12 noon, Journey Room (Call [place] to register [phone])

## 2017-12-19 ENCOUNTER — Encounter: Payer: Self-pay | Admitting: Orthopaedic Surgery

## 2017-12-19 ENCOUNTER — Ambulatory Visit (INDEPENDENT_AMBULATORY_CARE_PROVIDER_SITE_OTHER): Payer: Medicare Other | Admitting: Orthopaedic Surgery

## 2017-12-19 VITALS — BP 140/73 | HR 101 | Ht 68.0 in | Wt 157.0 lb

## 2017-12-19 DIAGNOSIS — F17210 Nicotine dependence, cigarettes, uncomplicated: Secondary | ICD-10-CM | POA: Diagnosis not present

## 2017-12-19 DIAGNOSIS — M25511 Pain in right shoulder: Secondary | ICD-10-CM | POA: Diagnosis not present

## 2017-12-19 MED ORDER — HYDROCODONE-ACETAMINOPHEN 7.5-325 MG PO TABS: ORAL_TABLET | ORAL | 0 refills | Status: DC

## 2017-12-19 NOTE — Progress Notes (Signed)
Patient Fred Hernandez, male DOB:07-12-1956, 62 y.o. VOH:[id number]  Chief Complaint  Patient presents with  . Shoulder Pain    right     HPI  Fred Hernandez is a 62 y.o. male who has more pain of the right shoulder.  He has no trauma.  He has no swelling.  MRI was negative.  He has a lesion on one of his right ribs and is to have a biopsy next week.  Raising his arm hurts his chest.  I have given exercises for him to do.  He declines PT.  I will increase pain medicine strength.  HPI  Body mass index is 23.87 kg/m.  ROS  Review of Systems  Constitutional:       Patient has Diabetes Mellitus. Patient has hypertension. Patient has COPD or shortness of breath. Patient does not have BMI > 35. Patient has current smoking history  HENT: Negative for congestion.   Cardiovascular: Negative for chest pain.  Endocrine: Positive for cold intolerance.  Musculoskeletal: Positive for arthralgias and myalgias.  Allergic/Immunologic: Positive for environmental allergies.  Neurological: Negative for numbness.  All other systems reviewed and are negative.   Past Medical History:  Diagnosis Date  . CAD (coronary artery disease)     s/p stent placement unknown artery 12/2014,  . Diabetes mellitus ([place])   . GERD (gastroesophageal reflux disease)   . History of prostate cancer   . Hyperlipemia   . Hypertension   . Peripheral arterial disease ([place])    a. h/o stents. b. 03/2016  - successful PTA and covered stenting using overlapping Viabahn covered stents of a long segment in-stent restenosis of previously placed nitinol self expanding stents in the proximal mid and distal right SFA.  . Tobacco abuse     Past Surgical History:  Procedure Laterality Date  . COLONOSCOPY WITH PROPOFOL N/A 11/22/2016   Procedure: COLONOSCOPY WITH PROPOFOL;  Surgeon: [name], MD;  [facility];  Service: Endoscopy;  Laterality: N/A;  10:00 am  . CORONARY STENT PLACEMENT  12-2014   [facility]  [employer]  . FEMORAL ARTERY STENT Bilateral 03/2013  . PERIPHERAL VASCULAR CATHETERIZATION N/A 04/04/2016   Procedure: Lower Extremity Angiography;  Surgeon: Lorretta Harp, MD;  Location: [facility];  Service: Cardiovascular;  Laterality: N/A;  . PERIPHERAL VASCULAR CATHETERIZATION N/A 04/04/2016   Procedure: Abdominal Aortogram;  Surgeon: Lorretta Harp, MD;  Location: [facility];  Service: Cardiovascular;  Laterality: N/A;  . PERIPHERAL VASCULAR CATHETERIZATION  04/04/2016   Procedure: Peripheral Vascular Intervention;  Surgeon: Lorretta Harp, MD;  Location: [place] [facility];  Service: Cardiovascular;;  rt SFA stent  . POLYPECTOMY  11/22/2016   Procedure: POLYPECTOMY;  Surgeon: [name], MD;  [facility];  Service: Endoscopy;;  descending colon polyp, transverse colon polyp, sigmoid colon polyp, rectal polyp  . STENT PLACEMENT VASCULAR ([place] [facility])  2013    Family History  Problem Relation Age of Onset  . Diabetes Mother   . Heart disease Mother   . Hypertension Mother   . Healthy Brother   . Other Brother        accident   . Arthritis Sister   . Arthritis Sister   . SIDS Brother   . Colon cancer Neg Hx     Social History Social History   Tobacco Use  . Smoking status: Current Every Day Smoker    Packs/day: 0.50    Years: 45.00  Pack years: 22.50    Types: Cigarettes    Start date: 02/12/1971  . Smokeless tobacco: Never Used  Substance Use Topics  . Alcohol use: Yes    Alcohol/week: 0.0 oz    Comment: Pt states he drinks a pint of liquor 2-3 times a week  . Drug use: No    Allergies  Allergen Reactions  . Lisinopril Anaphylaxis  . Penicillins Anaphylaxis    Has patient had a PCN reaction causing immediate rash, facial/tongue/throat swelling, SOB or lightheadedness with hypotension:Yes Has patient had a PCN reaction causing severe rash involving mucus membranes or skin necrosis:unsure Has patient had a PCN reaction  that required hospitalization:unsure Has patient had a PCN reaction occurring within the last 10 years:~10 years per patient If all of the above answers are "NO", then may proceed with Cephalosporin use.   . Ace Inhibitors Swelling    Current Outpatient Medications  Medication Sig Dispense Refill  . aspirin 81 MG chewable tablet Chew 81 mg by mouth daily.    [name] atorvastatin (LIPITOR) 80 MG tablet Take 80 mg by mouth at bedtime.     [name] CALCIUM-MAGNESIUM PO Take 1 tablet by mouth at bedtime.    . cilostazol (PLETAL) 50 MG tablet TAKE 1 TABLET(50 MG) BY MOUTH TWICE DAILY 60 tablet 6  . co-enzyme Q-10 30 MG capsule Take 30 mg by mouth at bedtime.     Mariane Baumgarten Sodium (COLACE PO) Take 1 tablet by mouth daily.    [name] FLOVENT HFA 110 MCG/ACT inhaler Inhale 1 puff into the lungs 2 (two) times daily.  2  . fluticasone (FLONASE) 50 MCG/ACT nasal spray Place 1 spray into both nostrils daily.    [name] ipratropium-albuterol (DUONEB) 0.5-2.5 (3) MG/3ML SOLN Take 3 mLs by nebulization every 6 (six) hours as needed (for shortness of breath).    [name] levofloxacin (LEVAQUIN) 750 MG tablet TK 1 T PO QD FOR 10 DAYS  0  . metFORMIN (GLUCOPHAGE) 500 MG tablet Take 500 mg by mouth 2 (two) times daily.     . metoprolol succinate (TOPROL-XL) 50 MG 24 hr tablet Take 50 mg by mouth at bedtime.     . Omega-3 Fatty Acids (FISH OIL) 1000 MG CAPS Take 1 capsule by mouth 2 (two) times daily.     . pantoprazole (PROTONIX) 40 MG tablet Take 40 mg by mouth 2 (two) times daily.    [name] HYDROcodone-acetaminophen (NORCO) 7.5-325 MG tablet One every four hours for pain as needed. 14 day limit 56 tablet 0   No current facility-administered medications for this visit.      Physical Exam  Blood pressure 140/73, pulse (!) 101, height 5\' 8"  (1.727 m), weight 157 lb (71.2 kg).  Constitutional: overall normal hygiene, normal nutrition, well developed, normal grooming, normal body habitus. Assistive device:none  Musculoskeletal: gait and  station Limp none, muscle tone and strength are normal, no tremors or atrophy is present.  .  Neurological: coordination overall normal.  Deep tendon reflex/nerve stretch intact.  Sensation normal.  Cranial nerves II-XII intact.   Skin:   Normal overall no scars, lesions, ulcers or rashes. No psoriasis.  Psychiatric: Alert and oriented x 3.  Recent memory intact, remote memory unclear.  Normal mood and affect. Well groomed.  Good eye contact.  Cardiovascular: overall no swelling, no varicosities, no edema bilaterally, normal temperatures of the legs and arms, no clubbing, cyanosis and good capillary refill.  Lymphatic: palpation is normal.  Examination of right Upper Extremity is done.  Inspection:   Overall:  Elbow non-tender without crepitus or defects, forearm non-tender without crepitus or defects, wrist non-tender without crepitus or defects, hand non-tender.    Shoulder: with glenohumeral joint tenderness, without effusion.   Upper arm: without swelling and tenderness   Range of motion:   Overall:  Full range of motion of the elbow, full range of motion of wrist and full range of motion in fingers.   Shoulder:  right  150 degrees forward flexion; 145 degrees abduction; 30 degrees internal rotation, 30 degrees external rotation, 15 degrees extension, 40 degrees adduction.   Stability:   Overall:  Shoulder, elbow and wrist stable   Strength and Tone:   Overall full shoulder muscles strength, full upper arm strength and normal upper arm bulk and tone.;h All other systems reviewed and are negative   The patient has been educated about the nature of the problem(s) and counseled on treatment options.  The patient appeared to understand what I have discussed and is in agreement with it.  Encounter Diagnoses  Name Primary?  . Pain in joint of right shoulder Yes  . Cigarette nicotine dependence without complication     PLAN Call if any problems.  Precautions discussed.  Continue  current medications.   Return to clinic 3 months   I have reviewed the [facility] web site prior to prescribing narcotic medicine for this patient.  Electronically Signed Sanjuana Kava, MD 2/19/20192:15 PM

## 2017-12-19 NOTE — Patient Instructions (Addendum)
Steps to Quit Smoking Smoking tobacco can be bad for your health. It can also affect almost every organ in your body. Smoking puts you and people around you at risk for many serious long-lasting (chronic) diseases. Quitting smoking is hard, but it is one of the best things that you can do for your health. It is never too late to quit. What are the benefits of quitting smoking? When you quit smoking, you lower your risk for getting serious diseases and conditions. They can include:  Lung cancer or lung disease.  Heart disease.  Stroke.  Heart attack.  Not being able to have children (infertility).  Weak bones (osteoporosis) and broken bones (fractures).  If you have coughing, wheezing, and shortness of breath, those symptoms may get better when you quit. You may also get sick less often. If you are pregnant, quitting smoking can help to lower your chances of having a baby of low birth weight. What can I do to help me quit smoking? Talk with your doctor about what can help you quit smoking. Some things you can do (strategies) include:  Quitting smoking totally, instead of slowly cutting back how much you smoke over a period of time.  Going to in-person counseling. You are more likely to quit if you go to many counseling sessions.  Using resources and support systems, such as: ? Online chats with a counselor. ? Phone quitlines. ? Printed self-help materials. ? Support groups or group counseling. ? Text messaging programs. ? Mobile phone apps or applications.  Taking medicines. Some of these medicines may have nicotine in them. If you are pregnant or breastfeeding, do not take any medicines to quit smoking unless your doctor says it is okay. Talk with your doctor about counseling or other things that can help you.  Talk with your doctor about using more than one strategy at the same time, such as taking medicines while you are also going to in-person counseling. This can help make  quitting easier. What things can I do to make it easier to quit? Quitting smoking might feel very hard at first, but there is a lot that you can do to make it easier. Take these steps:  Talk to your family and friends. Ask them to support and encourage you.  Call phone quitlines, reach out to support groups, or work with a counselor.  Ask people who smoke to not smoke around you.  Avoid places that make you want (trigger) to smoke, such as: ? Bars. ? Parties. ? Smoke-break areas at work.  Spend time with people who do not smoke.  Lower the stress in your life. Stress can make you want to smoke. Try these things to help your stress: ? Getting regular exercise. ? Deep-breathing exercises. ? Yoga. ? Meditating. ? Doing a body scan. To do this, close your eyes, focus on one area of your body at a time from head to toe, and notice which parts of your body are tense. Try to relax the muscles in those areas.  Download or buy apps on your mobile phone or tablet that can help you stick to your quit plan. There are many free apps, such as QuitGuide from the CDC (Centers for Disease Control and Prevention). You can find more support from smokefree.gov and other websites.  This information is not intended to replace advice given to you by your health care provider. Make sure you discuss any questions you have with your health care provider. Document Released: 08/13/2009 Document   Revised: 06/14/2016 Document Reviewed: 03/03/2015 Elsevier Interactive Patient Education  2018 [employer].  Shoulder Range of Motion Exercises Shoulder range of motion (ROM) exercises are designed to keep the shoulder moving freely. They are often recommended for people who have shoulder pain. Phase 1 exercises When you are able, do this exercise 5-6 days per week, or as told by your health care provider. Work toward doing 2 sets of 10 swings. Pendulum Exercise How To Do This Exercise Lying Down 1. Lie face-down on  a bed with your abdomen close to the side of the bed. 2. Let your arm hang over the side of the bed. 3. Relax your shoulder, arm, and hand. 4. Slowly and gently swing your arm forward and back. Do not use your neck muscles to swing your arm. They should be relaxed. If you are struggling to swing your arm, have someone gently swing it for you. When you do this exercise for the first time, swing your arm at a 15 degree angle for 15 seconds, or swing your arm 10 times. As pain lessens over time, increase the angle of the swing to 30-45 degrees. 5. Repeat steps 1-4 with the other arm.  How To Do This Exercise While Standing 1. Stand next to a sturdy chair or table and hold on to it with your hand. 1. Bend forward at the waist. 2. Bend your knees slightly. 3. Relax your other arm and let it hang limp. 4. Relax the shoulder blade of the arm that is hanging and let it drop. 5. While keeping your shoulder relaxed, use body motion to swing your arm in small circles. The first time you do this exercise, swing your arm for about 30 seconds or 10 times. When you do it next time, swing your arm for a little longer. 6. Stand up tall and relax. 7. Repeat steps 1-7, this time changing the direction of the circles. 2. Repeat steps 1-8 with the other arm.  Phase 2 exercises Do these exercises 3-4 times per day on 5-6 days per week or as told by your health care provider. Work toward holding the stretch for 20 seconds. Stretching Exercise 1 1. Lift your arm straight out in front of you. 2. Bend your arm 90 degrees at the elbow (right angle) so your forearm goes across your body and looks like the letter "L." 3. Use your other arm to gently pull the elbow forward and across your body. 4. Repeat steps 1-3 with the other arm. Stretching Exercise 2 You will need a towel or rope for this exercise. 1. Bend one arm behind your back with the palm facing outward. 2. Hold a towel with your other hand. 3. Reach the  arm that holds the towel above your head, and bend that arm at the elbow. Your wrist should be behind your neck. 4. Use your free hand to grab the free end of the towel. 5. With the higher hand, gently pull the towel up behind you. 6. With the lower hand, pull the towel down behind you. 7. Repeat steps 1-6 with the other arm.  Phase 3 exercises Do each of these exercises at four different times of day (sessions) every day or as told by your health care provider. To begin with, repeat each exercise 5 times (repetitions). Work toward doing 3 sets of 12 repetitions or as told by your health care provider. Strengthening Exercise 1 You will need a light weight for this activity. As you grow stronger, you  may use a heavier weight. 1. Standing with a weight in your hand, lift your arm straight out to the side until it is at the same height as your shoulder. 2. Bend your arm at 90 degrees so that your fingers are pointing to the ceiling. 3. Slowly raise your hand until your arm is straight up in the air. 4. Repeat steps 1-3 with the other arm.  Strengthening Exercise 2 You will need a light weight for this activity. As you grow stronger, you may use a heavier weight. 1. Standing with a weight in your hand, gradually move your straight arm in an arc, starting at your side, then out in front of you, then straight up over your head. 2. Gradually move your other arm in an arc, starting at your side, then out in front of you, then straight up over your head. 3. Repeat steps 1-2 with the other arm.  Strengthening Exercise 3 You will need an elastic band for this activity. As you grow stronger, gradually increase the size of the bands or increase the number of bands that you use at one time. 1. While standing, hold an elastic band in one hand and raise that arm up in the air. 2. With your other hand, pull down the band until that hand is by your side. 3. Repeat steps 1-2 with the other arm.  This  information is not intended to replace advice given to you by your health care provider. Make sure you discuss any questions you have with your health care provider. Document Released: 07/16/2003 Document Revised: 06/12/2016 Document Reviewed: 10/13/2014 Elsevier Interactive Patient Education  [employer].

## 2017-12-21 NOTE — Progress Notes (Signed)
Cardiology Office Note    Date:  12/22/2017   ID:  Fred, Hernandez 07/29/1956, MRN [id number]  PCP:  Vidal Schwalbe, MD  Cardiologist: Kate Sable, MD   PV: Dr. Gwenlyn Found   Chief Complaint  Patient presents with  . Pre-op Exam    History of Present Illness:    Fred Hernandez is a 62 y.o. male with past medical history of CAD (s/p stenting of unknown artery in 2016), PAD (s/p R SFA intervention with stenting in 03/2016 with known reocclusion since), HTN, HLD, and continued tobacco use who presents to the office today for preoperative cardiac clearance.    He was last examined by Dr. Bronson Ing in 07/2017 and denied any recent chest discomfort or dyspnea on exertion at that time. He has been followed by Dr. Gwenlyn Found for PAD and recent dopplers showed a re-occluded right SFA with reduction of right ABI down to 0.7. Not felt to be a repeat revascularization candidate and lifestyle modification was recommended with possible fem-pop bypass in the future if successful in smoking cessation.   The office has been contacted in the interim for cardiac clearance in regards to a prostate biopsy, therefore follow-up was arranged. Notes are reviewed and Dr. Gwenlyn Found has already stated he can hold Plavix for 7 days prior to the procedure.   In talking with the patient today, he reports doing well from a cardiac perspective since his last office visit. No recent chest discomfort, dyspnea on exertion, orthopnea, PND, or lower extremity edema. Does report having pain along his right leg with walking.  In reviewing records, he notes he is no longer on Plavix as this was discontinued at the time of his last office visit with Dr. Bronson Ing. He has remained on Pletal and Aspirin. He denies any evidence of active bleeding with this.  He has reduced his tobacco use from 2-3 packs per day to less than 0.5 ppd. Congratulated on this with complete cessation advised.   Past Medical History:  Diagnosis Date  . CAD  (coronary artery disease)     s/p stent placement unknown artery 12/2014,  . Diabetes mellitus ([place])   . GERD (gastroesophageal reflux disease)   . History of prostate cancer   . Hyperlipemia   . Hypertension   . Peripheral arterial disease ([place])    a. h/o stents. b. 03/2016  - successful PTA and covered stenting using overlapping Viabahn covered stents of a long segment in-stent restenosis of previously placed nitinol self expanding stents in the proximal mid and distal right SFA.  . Tobacco abuse     Past Surgical History:  Procedure Laterality Date  . COLONOSCOPY WITH PROPOFOL N/A 11/22/2016   Procedure: COLONOSCOPY WITH PROPOFOL;  Surgeon: [name], MD;  [facility];  Service: Endoscopy;  Laterality: N/A;  10:00 am  . CORONARY STENT PLACEMENT  12-2014   [facility] [employer]  . FEMORAL ARTERY STENT Bilateral 03/2013  . PERIPHERAL VASCULAR CATHETERIZATION N/A 04/04/2016   Procedure: Lower Extremity Angiography;  Surgeon: Lorretta Harp, MD;  Location: [place] [facility];  Service: Cardiovascular;  Laterality: N/A;  . PERIPHERAL VASCULAR CATHETERIZATION N/A 04/04/2016   Procedure: Abdominal Aortogram;  Surgeon: Lorretta Harp, MD;  Location: [place] [facility];  Service: Cardiovascular;  Laterality: N/A;  . PERIPHERAL VASCULAR CATHETERIZATION  04/04/2016   Procedure: Peripheral Vascular Intervention;  Surgeon: Lorretta Harp, MD;  Location: [place] [facility];  Service: Cardiovascular;;  rt SFA stent  .  POLYPECTOMY  11/22/2016   Procedure: POLYPECTOMY;  Surgeon: [name], MD;  [facility];  Service: Endoscopy;;  descending colon polyp, transverse colon polyp, sigmoid colon polyp, rectal polyp  . STENT PLACEMENT VASCULAR ([place] [facility])  2013    Current Medications: Outpatient Medications Prior to Visit  Medication Sig Dispense Refill  . aspirin 81 MG chewable tablet Chew 81 mg by mouth daily.    [name] atorvastatin (LIPITOR) 80 MG tablet Take 80 mg  by mouth at bedtime.     [name] CALCIUM-MAGNESIUM PO Take 1 tablet by mouth at bedtime.    . cilostazol (PLETAL) 50 MG tablet TAKE 1 TABLET(50 MG) BY MOUTH TWICE DAILY 60 tablet 6  . co-enzyme Q-10 30 MG capsule Take 30 mg by mouth at bedtime.     [name] FLOVENT HFA 110 MCG/ACT inhaler Inhale 1 puff into the lungs 2 (two) times daily.  2  . fluticasone (FLONASE) 50 MCG/ACT nasal spray Place 1 spray into both nostrils daily.    [name] HYDROcodone-acetaminophen (NORCO) 7.5-325 MG tablet One every four hours for pain as needed. 14 day limit 56 tablet 0  . ipratropium-albuterol (DUONEB) 0.5-2.5 (3) MG/3ML SOLN Take 3 mLs by nebulization every 6 (six) hours as needed (for shortness of breath).    . metFORMIN (GLUCOPHAGE) 500 MG tablet Take 500 mg by mouth 2 (two) times daily.     . metoprolol succinate (TOPROL-XL) 50 MG 24 hr tablet Take 50 mg by mouth at bedtime.     . Omega-3 Fatty Acids (FISH OIL) 1000 MG CAPS Take 1 capsule by mouth 2 (two) times daily.     . pantoprazole (PROTONIX) 40 MG tablet Take 40 mg by mouth 2 (two) times daily.    Mariane Baumgarten Sodium (COLACE PO) Take 1 tablet by mouth daily.    [name] levofloxacin (LEVAQUIN) 750 MG tablet TK 1 T PO QD FOR 10 DAYS  0   No facility-administered medications prior to visit.      Allergies:   Lisinopril; Penicillins; and Ace inhibitors   Social History   Socioeconomic History  . Marital status: Single    Spouse name: None  . Number of children: None  . Years of education: None  . Highest education level: None  Social Needs  . Financial resource strain: None  . Food insecurity - worry: None  . Food insecurity - inability: None  . Transportation needs - medical: None  . Transportation needs - non-medical: None  Occupational History  . None  Tobacco Use  . Smoking status: Current Every Day Smoker    Packs/day: 0.50    Years: 45.00    Pack years: 22.50    Types: Cigarettes    Start date: 02/12/1971  . Smokeless tobacco: Never Used  Substance and  Sexual Activity  . Alcohol use: Yes    Alcohol/week: 0.0 oz    Comment: Pt states he drinks a pint of liquor 2-3 times a week  . Drug use: No  . Sexual activity: Yes  Other Topics Concern  . None  Social History Narrative  . None     Family History:  The patient's family history includes Arthritis in his sister and sister; Diabetes in his mother; Healthy in his brother; Heart disease in his mother; Hypertension in his mother; Other in his brother; SIDS in his brother.   Review of Systems:   Please see the history of present illness.     General:  No chills, fever, night sweats  or weight changes.  Cardiovascular:  No chest pain, dyspnea on exertion, edema, orthopnea, palpitations, paroxysmal nocturnal dyspnea. Positive for claudication.  Dermatological: No rash, lesions/masses Respiratory: No cough, dyspnea Urologic: No hematuria, dysuria Abdominal:   No nausea, vomiting, diarrhea, bright red blood per rectum, melena, or hematemesis Neurologic:  No visual changes, wkns, changes in mental status. All other systems reviewed and are otherwise negative except as noted above.   Physical Exam:    VS:  BP 140/76   Pulse (!) 102   Ht 5\' 8"  (1.727 m)   Wt 156 lb 9.6 oz (71 kg)   SpO2 98% Comment: on room air  BMI 23.81 kg/m    General: Well developed, well nourished [place] American male appearing in no acute distress. Head: Normocephalic, atraumatic, sclera non-icteric, no xanthomas, nares are without discharge.  Neck: No carotid bruits. JVD not elevated.  Lungs: Respirations regular and unlabored, without wheezes or rales.  Heart: Regular rate and rhythm. No S3 or S4.  No murmur, no rubs, or gallops appreciated. Abdomen: Soft, non-tender, non-distended with normoactive bowel sounds. No hepatomegaly. No rebound/guarding. No obvious abdominal masses. Msk:  Strength and tone appear normal for age. No joint deformities or effusions. Extremities: No clubbing or cyanosis. No lower  extremity edema.  Distal pedal pulses are 1+ bilaterally. Neuro: Alert and oriented X 3. Moves all extremities spontaneously. No focal deficits noted. Psych:  Responds to questions appropriately with a normal affect. Skin: No rashes or lesions noted  Wt Readings from Last 3 Encounters:  12/22/17 156 lb 9.6 oz (71 kg)  12/19/17 157 lb (71.2 kg)  12/14/17 157 lb (71.2 kg)      Studies/Labs Reviewed:   EKG:  EKG is ordered today.  The ekg ordered today demonstrates sinus tachycardia, HR 104, with no acute ST or T-wave changes when compared to prior tracings.   Recent Labs: No results found for requested labs within last 8760 hours.   Lipid Panel No results found for: CHOL, TRIG, HDL, CHOLHDL, VLDL, LDLCALC, LDLDIRECT  Additional studies/ records that were reviewed today include:   Lower Extremity Dopplers: 12/14/2016    Assessment:    1. Coronary artery disease involving native coronary artery of native heart without angina pectoris   2. Peripheral arterial disease ([place])   3. Essential hypertension   4. Hyperlipidemia LDL goal <70   5. Preoperative cardiovascular examination   6. Tobacco abuse      Plan:   In order of problems listed above:  1. CAD - s/p stenting of unknown artery in 2016. We reviewed his stent card today but it does not include the location of his stent.  - he denies any recent chest discomfort or dyspnea on exertion. Does not exercise regularly secondary to claudication symptoms but denies any anginal symptoms when carrying out daily activities.  - continue ASA, statin, and BB therapy.   2. PAD - s/p R SFA intervention with stenting in 03/2016 with known reocclusion since. Not felt to be a repeat revascularization candidate and lifestyle modification was recommended with possible fem-pop bypass in the future if successful in smoking cessation.  - Followed by Dr. Gwenlyn Found and he has scheduled follow-up next week.  - continue Pletal, ASA, and statin  therapy. Can hold Pletal and ASA for upcoming surgery as outlined below.   3. HTN - BP slightly elevated at 140/76 during today's visit. Says this has been well-controlled at his other recent visits. Will continue to follow. - continue Toprol-XL 50mg  daily.  4. HLD - followed by PCP. Goal LDL is < 70 with known CAD and PAD.  - remains on Atorvastatin 80mg  daily.   5. Preoperative Cardiac Clearance - he is scheduled for a prostate biopsy next month and with him denying any recent anginal symptoms, would not pursue further ischemic evaluation prior to the surgery as the biopsy is low-risk from a cardiac perspective. EKG today shows no acute ischemic changes. He is of acceptable risk to proceed.  - can hold ASA and Pletal for 5 days prior to the procedure. The original request had stated the need to hold Plavix but this was discontinued in 07/2017. Will forward today's note to [facility].   6. Tobacco use - has reduced his tobacco use from 2-3 packs per day to less than 0.5 ppd.  - Congratulated on this accomplishment with complete cessation advised.     Medication Adjustments/Labs and Tests Ordered: Current medicines are reviewed at length with the patient today.  Concerns regarding medicines are outlined above.  Medication changes, Labs and Tests ordered today are listed in the Patient Instructions below. Patient Instructions  Medication Instructions:  Your physician recommends that you continue on your current medications as directed. Please refer to the Current Medication list given to you today.  Labwork: NONE   Testing/Procedures: NONE   Follow-Up: Your physician recommends that you schedule a follow-up appointment with Dr. Bronson Ing.    Any Other Special Instructions Will Be Listed Below (If Applicable). Hold Pletal and Aspirin 5 Days before surgery.   If you need a refill on your cardiac medications before your next appointment, please call your pharmacy.  Thank  you for choosing [facility]!    Signed, Erma Heritage, PA-C  12/22/2017 7:56 PM    [facility] [address] [place], [place] [address] Phone: [phone]

## 2017-12-22 ENCOUNTER — Ambulatory Visit (INDEPENDENT_AMBULATORY_CARE_PROVIDER_SITE_OTHER): Payer: Medicare Other | Admitting: Student

## 2017-12-22 ENCOUNTER — Encounter: Payer: Self-pay | Admitting: Student

## 2017-12-22 ENCOUNTER — Other Ambulatory Visit: Payer: Self-pay

## 2017-12-22 VITALS — BP 140/76 | HR 102 | Ht 68.0 in | Wt 156.6 lb

## 2017-12-22 DIAGNOSIS — I10 Essential (primary) hypertension: Secondary | ICD-10-CM | POA: Diagnosis not present

## 2017-12-22 DIAGNOSIS — I2510 Atherosclerotic heart disease of native coronary artery without angina pectoris: Secondary | ICD-10-CM

## 2017-12-22 DIAGNOSIS — I739 Peripheral vascular disease, unspecified: Secondary | ICD-10-CM

## 2017-12-22 DIAGNOSIS — Z720 Tobacco use: Secondary | ICD-10-CM

## 2017-12-22 DIAGNOSIS — Z01810 Encounter for preprocedural cardiovascular examination: Secondary | ICD-10-CM | POA: Diagnosis not present

## 2017-12-22 DIAGNOSIS — E785 Hyperlipidemia, unspecified: Secondary | ICD-10-CM

## 2017-12-22 NOTE — Patient Instructions (Signed)
Medication Instructions:  Your physician recommends that you continue on your current medications as directed. Please refer to the Current Medication list given to you today.   Labwork: NONE   Testing/Procedures: NONE   Follow-Up: Your physician recommends that you schedule a follow-up appointment with Dr. Bronson Ing.    Any Other Special Instructions Will Be Listed Below (If Applicable). Hold Pletal and Aspirin 5 Days before surgery.     If you need a refill on your cardiac medications before your next appointment, please call your pharmacy.  Thank you for choosing [facility]!

## 2017-12-25 ENCOUNTER — Encounter: Payer: Medicare Other | Admitting: Thoracic Surgery (Cardiothoracic Vascular Surgery)

## 2017-12-25 NOTE — Progress Notes (Signed)
Diagnosis Solitary pulmonary nodule - Plan: MR Brain W Wo Contrast  Staging Cancer Staging No matching staging information was found for the patient.  Assessment and Plan: 1.  Right lung mass.  62 year old male who reports cough and weight loss.  He underwent a CT of the chest on 11/20/2017 that showed an abnormal density in the right apex measuring 4.7 x 4 cm that was concerning for primary lung cancer.  There were also calcified plaques noted in the right hemithorax.  He was recommended for a PET scan for further evaluation.  The patient has been seen by Dr. Luan Pulling of pulmonary and reportedly the area was not amenable to biopsy via bronchoscopy.  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  There was also evidence of destruction of the right second rib.  He was also noted to have pleural thickening and calcifications.  There was small mediastinal lymph nodes that were not hypermetabolic.  The  patient has not undergone a biopsy and is seen today for consultation due to the abnormal findings that are suspicious for  malignancy.  Mike Craze nurse practitioner spoke with Dr. Luan Pulling in January 2019 regarding biopsy.  Reportedly the mass was not amenable to biopsy per Dr. Luan Pulling.  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  There was evidence of bone invasion of the right second rib and destruction of the rib by tumor.  There was additional pleural thickening and pleural calcifications.  There was small mediastinal lymph nodes that were not appreciatively hypermetabolic.  There was also evidence of activity in the bowel which was felt to be physiologic.  Patient will be referred to CT surgery for evaluation and biopsy.  He will return to clinic to go over the results of his final pathology and to discuss treatment  recommendations.  He will also be set up for an MRI of the brain for further evaluation.    2.  Headache.  Patient will be set up for an MRI of the brain.  3.  Smoking.  Smoking cessation is recommended.  4.  Weight loss.  This is likely secondary to the abnormality noted on imaging.    5.  Cough.  This is likely due to lung findings noted on imaging.  Pulse ox is 99% on Room air.  He should notify the office if symptoms worsen.    6.  Hypertension.  BP is 112/71.  Follow-up with PCP.     HPI: 62 year old male who reports cough and weight loss.  He underwent a CT of the chest on 11/20/2017 that showed an abnormal density in the right apex measuring 4.7 x 4 cm that was concerning for primary lung cancer.  There were also calcified plaques noted in the right hemithorax.  He was recommended for a PET scan for further evaluation.  The patient has been seen by Dr. Luan Pulling of pulmonary and reportedly the area was not amenable to biopsy via bronchoscopy.  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  There was also evidence of destruction of the right second rib.  He was also noted to have pleural thickening and calcifications.  There was small mediastinal lymph  nodes that were not hypermetabolic.  The  patient has not undergone a biopsy and is seen today for consultation due to the abnormal findings that are suspicious for  malignancy.  Problem List Patient Active Problem List   Diagnosis Date Noted  . History of colonic polyps [Z86.010] 09/20/2016  . Essential hypertension [I10] 04/05/2016  . Hyperlipidemia [E78.5] 04/05/2016  . 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 Tobacco abuse [Z72.0] 04/05/2016  . Claudication ([place]) [I73.9] 04/04/2016  . Peripheral arterial disease ([place]) [I73.9] 03/16/2016  . Left shoulder pain [M25.512] 12/23/2015    Past Medical  History Past Medical History:  Diagnosis Date  . CAD (coronary artery disease)     s/p stent placement unknown artery 12/2014,  . Diabetes mellitus ([place])   . GERD (gastroesophageal reflux disease)   . History of prostate cancer   . Hyperlipemia   . Hypertension   . Peripheral arterial disease ([place])    a. h/o stents. b. 03/2016  - successful PTA and covered stenting using overlapping Viabahn covered stents of a long segment in-stent restenosis of previously placed nitinol self expanding stents in the proximal mid and distal right SFA.  . Tobacco abuse     Past Surgical History Past Surgical History:  Procedure Laterality Date  . COLONOSCOPY WITH PROPOFOL N/A 11/22/2016   Procedure: COLONOSCOPY WITH PROPOFOL;  Surgeon: [name], MD;  [facility];  Service: Endoscopy;  Laterality: N/A;  10:00 am  . CORONARY STENT PLACEMENT  12-2014   [facility] [employer]  . FEMORAL ARTERY STENT Bilateral 03/2013  . PERIPHERAL VASCULAR CATHETERIZATION N/A 04/04/2016   Procedure: Lower Extremity Angiography;  Surgeon: Lorretta Harp, MD;  Location: [place] [facility];  Service: Cardiovascular;  Laterality: N/A;  . PERIPHERAL VASCULAR CATHETERIZATION N/A 04/04/2016   Procedure: Abdominal Aortogram;  Surgeon: Lorretta Harp, MD;  Location: [place] [facility];  Service: Cardiovascular;  Laterality: N/A;  . PERIPHERAL VASCULAR CATHETERIZATION  04/04/2016   Procedure: Peripheral Vascular Intervention;  Surgeon: Lorretta Harp, MD;  Location: [place] [facility];  Service: Cardiovascular;;  rt SFA stent  . POLYPECTOMY  11/22/2016   Procedure: POLYPECTOMY;  Surgeon: [name], MD;  [facility];  Service: Endoscopy;;  descending colon polyp, transverse colon polyp, sigmoid colon polyp, rectal polyp  . STENT PLACEMENT VASCULAR ([place] [facility]X)  2013    Family History Family History  Problem Relation Age of Onset  . Diabetes Mother   . Heart disease Mother   . Hypertension  Mother   . Healthy Brother   . Other Brother        accident   . Arthritis Sister   . Arthritis Sister   . SIDS Brother   . Colon cancer Neg Hx      Social History  reports that he has been smoking cigarettes.  He started smoking about 46 years ago. He has a 22.50 pack-year smoking history. he has never used smokeless tobacco. He reports that he drinks alcohol. He reports that he does not use drugs.  Medications  Current Outpatient Medications:  .  aspirin 81 MG chewable tablet, Chew 81 mg by mouth daily., Disp: , Rfl:  .  atorvastatin (LIPITOR) 80 MG tablet, Take 80 mg by mouth at bedtime. , Disp: , Rfl:  .  CALCIUM-MAGNESIUM PO, Take 1 tablet by mouth at bedtime., Disp: , Rfl:  .  cilostazol (PLETAL) 50 MG tablet, TAKE 1 TABLET(50 MG) BY  MOUTH TWICE DAILY, Disp: 60 tablet, Rfl: 6 .  co-enzyme Q-10 30 MG capsule, Take 30 mg by mouth at bedtime. , Disp: , Rfl:  .  FLOVENT HFA 110 MCG/ACT inhaler, Inhale 1 puff into the lungs 2 (two) times daily., Disp: , Rfl: 2 .  fluticasone (FLONASE) 50 MCG/ACT nasal spray, Place 1 spray into both nostrils daily., Disp: , Rfl:  .  ipratropium-albuterol (DUONEB) 0.5-2.5 (3) MG/3ML SOLN, Take 3 mLs by nebulization every 6 (six) hours as needed (for shortness of breath)., Disp: , Rfl:  .  metFORMIN (GLUCOPHAGE) 500 MG tablet, Take 500 mg by mouth 2 (two) times daily. , Disp: , Rfl:  .  metoprolol succinate (TOPROL-XL) 50 MG 24 hr tablet, Take 50 mg by mouth at bedtime. , Disp: , Rfl:  .  Omega-3 Fatty Acids (FISH OIL) 1000 MG CAPS, Take 1 capsule by mouth 2 (two) times daily. , Disp: , Rfl:  .  pantoprazole (PROTONIX) 40 MG tablet, Take 40 mg by mouth 2 (two) times daily., Disp: , Rfl:  .  HYDROcodone-acetaminophen (NORCO) 7.5-325 MG tablet, One every four hours for pain as needed. 14 day limit, Disp: 56 tablet, Rfl: 0  Allergies Lisinopril; Penicillins; and Ace inhibitors  Review of Systems Review of Systems - Oncology ROS as per HPI otherwise 12  point ROS is negative other than occasional headache and right shoulder and right chest wall pain and cough.     Physical Exam  Vitals  Temp Readings from Last 3 Encounters:  12/14/17 97.7 F (36.5 C) (Oral)  12/05/17 98.3 F (36.8 C)  05/11/17 97.7 F (36.5 C)    HR 93, RR 18 BP 112/71 Pulse ox 99% on room air.   Constitutional: Well-developed, well-nourished, and in no distress.   HENT: Head: Normocephalic and atraumatic.  Mouth/Throat: No oropharyngeal exudate. Mucosa moist. Eyes: Pupils are equal, round, and reactive to light. Conjunctivae are normal. No scleral icterus.  Neck: Normal range of motion. Neck supple. No JVD present.  Cardiovascular: Normal rate, regular rhythm and normal heart sounds.  Exam reveals no gallop and no friction rub.   No murmur heard. Pulmonary/Chest: Effort normal.  Coarse BS Abdominal: Soft. Bowel sounds are normal. No distension. There is no tenderness. There is no guarding.  Musculoskeletal: No edema or tenderness.  Lymphadenopathy: No cervical, axillary or supraclavicular adenopathy.  Neurological: Alert and oriented to person, place, and time. No cranial nerve deficit.  Skin: Skin is warm and dry. No rash noted. No erythema. No pallor.  Psychiatric: Affect and judgment normal.   [facility] Outpatient Visit on 12/13/2017  Component Date Value Ref Range Status  . Glucose-Capillary 12/13/2017 118* 65 - 99 mg/dL Final    CT of the chest done 11/20/2017 that showed an abnormal density in the right apex measuring 4.7 x 4 cm that was concerning for primary lung cancer.  There were also calcified plaques noted in the right hemithorax.    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  There was also evidence of destruction of the right second rib.  He was also noted to have pleural thickening and calcifications.  There was small mediastinal lymph nodes that were  not hypermetabolic.     Orders Placed This Encounter  Procedures  . MR Brain W Wo Contrast    Epic order Labs in epic 11/21/17 Wt 157 / ht 5'8 / claus /  no hx of brain sx / heart stent - 2016 / R leg stent - 2018 @ [facility] / no eyes, ears sx / htn / diab / no liv/kid da / nkda to cm / no lupus or RA / no needs Ins - uhc/mcr/mcd Bl/Amy @ [facility]    Standing Status:   Future    Standing Expiration Date:   12/14/2018    Scheduling Instructions:     Evaluate for brain mets    Order Specific Question:   If indicated for the ordered procedure, I authorize the administration of contrast media per Radiology protocol    Answer:   Yes    Order Specific Question:   What is the patient's sedation requirement?    Answer:   No Sedation    Order Specific Question:   Does the patient have a pacemaker or implanted devices?    Answer:   No    Order Specific Question:   Radiology Contrast Protocol - do NOT remove file path    Answer:   [email or web address]    Order Specific Question:   Preferred imaging location?    Answer:   [facility] (table limit-550lbs)     Zoila Shutter MD   Cc:  Dr. Bartolo Darter and Dr. Luan Pulling

## 2017-12-26 ENCOUNTER — Ambulatory Visit: Payer: Medicare Other | Admitting: Cardiovascular Disease

## 2017-12-27 ENCOUNTER — Ambulatory Visit
Admission: RE | Admit: 2017-12-27 | Discharge: 2017-12-27 | Disposition: A | Discharge status: Home / Self Care | Payer: Medicare Other | Source: Ambulatory Visit | Attending: Internal Medicine | Admitting: Internal Medicine

## 2017-12-27 DIAGNOSIS — R911 Solitary pulmonary nodule: Secondary | ICD-10-CM

## 2017-12-27 MED ORDER — GADOBENATE DIMEGLUMINE 529 MG/ML IV SOLN
15.0000 mL | Freq: Once | INTRAVENOUS | Status: AC | PRN
  Administered 2017-12-27: 15 mL via INTRAVENOUS

## 2017-12-29 ENCOUNTER — Encounter: Payer: Medicare Other | Admitting: Thoracic Surgery (Cardiothoracic Vascular Surgery)

## 2017-12-29 ENCOUNTER — Ambulatory Visit (HOSPITAL_COMMUNITY): Payer: Medicare Other | Admitting: Internal Medicine

## 2018-01-03 ENCOUNTER — Institutional Professional Consult (permissible substitution) (INDEPENDENT_AMBULATORY_CARE_PROVIDER_SITE_OTHER): Payer: Medicare Other | Admitting: Thoracic Surgery (Cardiothoracic Vascular Surgery)

## 2018-01-03 ENCOUNTER — Other Ambulatory Visit: Payer: Self-pay | Admitting: Orthopaedic Surgery

## 2018-01-03 ENCOUNTER — Encounter: Payer: Self-pay | Admitting: Thoracic Surgery (Cardiothoracic Vascular Surgery)

## 2018-01-03 ENCOUNTER — Other Ambulatory Visit: Payer: Self-pay | Admitting: *Deleted

## 2018-01-03 VITALS — BP 120/75 | HR 96 | Resp 20 | Ht 68.0 in | Wt 156.0 lb

## 2018-01-03 DIAGNOSIS — R918 Other nonspecific abnormal finding of lung field: Secondary | ICD-10-CM

## 2018-01-03 MED ORDER — HYDROCODONE-ACETAMINOPHEN 7.5-325 MG PO TABS: ORAL_TABLET | ORAL | 0 refills | Status: DC

## 2018-01-03 NOTE — Progress Notes (Signed)
PCP is Vidal Schwalbe, MD Referring Provider is Higgs, Mathis Dad, MD  Chief Complaint  Patient presents with  . Lung Mass    Surgical eval, Chest CT 11/20/17, PET Scan 12/13/17, MR Brain 12/27/17    HPI: Fred Hernandez is sent for consultation regarding a right apical lung mass with chest wall invasion  Fred Hernandez read is a 62 year old man with a long history of tobacco abuse (100 pack years), COPD, coronary disease with previous stent, peripheral arterial disease with bilateral stents, type 2 diabetes mellitus, hypertension, hyperlipidemia, and gastroesophageal reflux.  He is a poor historian.  He recently presented with a cough.  That led to a chest x-ray.  He was treated with antibiotics but also referred for a CT of the chest.  CT showed a 4.7 x 4 cm density in the right apex concerning for primary lung cancer.  There appeared to be some involvement of the chest wall.  There were also calcified pleural plaques in the right chest.  A PET/CT showed the mass had an SUV of 20 and there was likely some destruction of the right second rib.  He was referred to Dr. Walden Field.  He has had right shoulder pain for about 3 months.  He has had it injected a couple of times without much relief.  He currently is taking hydrocodone for that.  He has a productive cough, wheezing, and frequent episodes of bronchitis.  He denies any current chest pain, pressure, or tightness.  He does get short of breath with exertion although his exertion is also limited by claudication in his right leg.  He complains of loss of appetite but says he is only lost about 2 pounds over the past 3 months.  He was having headaches, but an MRI of the brain was negative for metastatic disease.  He has smoked about 2 packs a day beginning at age 55.  He currently smoking about 1/2 pack of cigarettes daily.  Zubrod Score: At the time of surgery this patient's most appropriate activity status/level should be described as: []     0    Normal activity, no  symptoms []     1    Restricted in physical strenuous activity but ambulatory, able to do out light work [x]     2    Ambulatory and capable of self care, unable to do work activities, up and about >50 % of waking hours                              []     3    Only limited self care, in bed greater than 50% of waking hours []     4    Completely disabled, no self care, confined to bed or chair []     5    Moribund  Past Medical History:  Diagnosis Date  . CAD (coronary artery disease)     s/p stent placement unknown artery 12/2014,  . Diabetes mellitus ([place])   . GERD (gastroesophageal reflux disease)   . History of prostate cancer   . Hyperlipemia   . Hypertension   . Peripheral arterial disease ([place])    a. h/o stents. b. 03/2016  - successful PTA and covered stenting using overlapping Viabahn covered stents of a long segment in-stent restenosis of previously placed nitinol self expanding stents in the proximal mid and distal right SFA.  . Tobacco abuse     Past Surgical History:  Procedure Laterality  Date  . COLONOSCOPY WITH PROPOFOL N/A 11/22/2016   Procedure: COLONOSCOPY WITH PROPOFOL;  Surgeon: [name], MD;  [facility];  Service: Endoscopy;  Laterality: N/A;  10:00 am  . CORONARY STENT PLACEMENT  12-2014   [facility] [employer]  . FEMORAL ARTERY STENT Bilateral 03/2013  . PERIPHERAL VASCULAR CATHETERIZATION N/A 04/04/2016   Procedure: Lower Extremity Angiography;  Surgeon: Lorretta Harp, MD;  Location: [place] [facility];  Service: Cardiovascular;  Laterality: N/A;  . PERIPHERAL VASCULAR CATHETERIZATION N/A 04/04/2016   Procedure: Abdominal Aortogram;  Surgeon: Lorretta Harp, MD;  Location: [place] [facility];  Service: Cardiovascular;  Laterality: N/A;  . PERIPHERAL VASCULAR CATHETERIZATION  04/04/2016   Procedure: Peripheral Vascular Intervention;  Surgeon: Lorretta Harp, MD;  Location: [place] [facility];  Service: Cardiovascular;;  rt SFA stent  .  POLYPECTOMY  11/22/2016   Procedure: POLYPECTOMY;  Surgeon: [name], MD;  Location: [facility];  Service: Endoscopy;;  descending colon polyp, transverse colon polyp, sigmoid colon polyp, rectal polyp  . STENT PLACEMENT VASCULAR ([place] [facility])  2013    Family History  Problem Relation Age of Onset  . Diabetes Mother   . Heart disease Mother   . Hypertension Mother   . Healthy Brother   . Other Brother        accident   . Arthritis Sister   . Arthritis Sister   . SIDS Brother   . Colon cancer Neg Hx     Social History Social History   Tobacco Use  . Smoking status: Current Every Day Smoker    Packs/day: 0.50    Years: 45.00    Pack years: 22.50    Types: Cigarettes    Start date: 02/12/1971  . Smokeless tobacco: Never Used  Substance Use Topics  . Alcohol use: Yes    Alcohol/week: 0.0 oz    Comment: Pt states he drinks a pint of liquor 2-3 times a week  . Drug use: No    Current Outpatient Medications  Medication Sig Dispense Refill  . aspirin 81 MG chewable tablet Chew 81 mg by mouth daily.    [name] atorvastatin (LIPITOR) 80 MG tablet Take 80 mg by mouth at bedtime.     [name] CALCIUM-MAGNESIUM PO Take 1 tablet by mouth at bedtime.    . cilostazol (PLETAL) 50 MG tablet TAKE 1 TABLET(50 MG) BY MOUTH TWICE DAILY 60 tablet 6  . co-enzyme Q-10 30 MG capsule Take 30 mg by mouth at bedtime.     [name] FLOVENT HFA 110 MCG/ACT inhaler Inhale 1 puff into the lungs 2 (two) times daily.  2  . fluticasone (FLONASE) 50 MCG/ACT nasal spray Place 1 spray into both nostrils daily.    [name] HYDROcodone-acetaminophen (NORCO) 7.5-325 MG tablet One every four hours for pain as needed. 14 day limit 56 tablet 0  . ipratropium-albuterol (DUONEB) 0.5-2.5 (3) MG/3ML SOLN Take 3 mLs by nebulization every 6 (six) hours as needed (for shortness of breath).    . metFORMIN (GLUCOPHAGE) 500 MG tablet Take 500 mg by mouth 2 (two) times daily.     . metoprolol succinate (TOPROL-XL) 50 MG 24 hr tablet Take 50 mg by  mouth at bedtime.     . Omega-3 Fatty Acids (FISH OIL) 1000 MG CAPS Take 1 capsule by mouth 2 (two) times daily.     . pantoprazole (PROTONIX) 40 MG tablet Take 40 mg by mouth 2 (two) times daily.  No current facility-administered medications for this visit.     Allergies  Allergen Reactions  . Lisinopril Anaphylaxis  . Penicillins Anaphylaxis    Has patient had a PCN reaction causing immediate rash, facial/tongue/throat swelling, SOB or lightheadedness with hypotension:Yes Has patient had a PCN reaction causing severe rash involving mucus membranes or skin necrosis:unsure Has patient had a PCN reaction that required hospitalization:unsure Has patient had a PCN reaction occurring within the last 10 years:~10 years per patient If all of the above answers are "NO", then may proceed with Cephalosporin use.   . Ace Inhibitors Swelling    Review of Systems  Constitutional: Positive for appetite change. Negative for activity change, diaphoresis, fatigue, fever and unexpected weight change.  HENT: Positive for dental problem. Negative for trouble swallowing and voice change.   Eyes: Negative for visual disturbance.  Respiratory: Positive for cough, shortness of breath and wheezing.   Cardiovascular: Negative for chest pain and leg swelling.  Gastrointestinal: Negative for abdominal distention and blood in stool.       Reflux  Genitourinary: Negative for difficulty urinating and dysuria.       Elevated PSA  Musculoskeletal: Positive for arthralgias, gait problem and joint swelling.  Neurological: Positive for headaches. Negative for seizures and weakness.  Hematological: Negative for adenopathy. Bruises/bleeds easily.  All other systems reviewed and are negative.   BP 120/75   Pulse 96   Resp 20   Ht 5\' 8"  (1.727 m)   Wt 156 lb (70.8 kg)   SpO2 97% Comment: RA  BMI 23.72 kg/m  Physical Exam  Constitutional: He is oriented to person, place, and time. He appears well-developed  and well-nourished. No distress.  HENT:  Head: Normocephalic and atraumatic.  Mouth/Throat: No oropharyngeal exudate.  Eyes: Conjunctivae and EOM are normal.  Neck: Neck supple. No tracheal deviation present. No thyromegaly present.  Cardiovascular: Normal rate, regular rhythm, normal heart sounds and intact distal pulses. Exam reveals no gallop and no friction rub.  No murmur heard. Pulmonary/Chest: Effort normal. No respiratory distress.  Bronchial breath sounds bilaterally and faint wheezing  Abdominal: Soft. He exhibits no distension. There is no tenderness.  Musculoskeletal: He exhibits no edema.  Lymphadenopathy:    He has no cervical adenopathy.  Neurological: He is alert and oriented to person, place, and time. No cranial nerve deficit.  Motor grossly intact bilaterally  Skin: Skin is warm and dry.  Vitals reviewed.    Diagnostic Tests: CT CHEST WITH CONTRAST  TECHNIQUE: Multidetector CT imaging of the chest was performed during intravenous contrast administration.  CONTRAST:  83mL ISOVUE-300 IOPAMIDOL (ISOVUE-300) INJECTION 61%  COMPARISON:  Chest x-ray 03/29/2016  FINDINGS: Cardiovascular: Heart is normal size. Dense coronary artery calcifications noted in the left anterior descending and right coronary artery as well as branches of the LAD. Aorta is normal caliber. No evidence of aneurysm or dissection. Scattered aortic arch calcifications.  Mediastinum/Nodes: No enlarged mediastinal, hilar or axillary lymph nodes. Pre carina lymph node has a short axis diameter of 6 mm on image 50. Other smaller scattered mediastinal lymph nodes, none pathologically enlarged.  Lungs/Pleura: Calcified pleural plaques are noted posteriorly in the right hemithorax. The unilateral alley of this finding suggests this may be related to old empyema or hemothorax.  Moderate emphysema. Large bulla noted in the right apex. Adjacent to the bulla, there is abnormal masslike  density in the right upper lobe which measures approximately 4.7 x 4.0 cm. Appearance is suspicious for primary lung cancer. No pleural effusions.  No confluent opacity on the left.  Upper Abdomen: Imaging into the upper abdomen demonstrates diffuse low-density throughout the liver compatible with fatty infiltration. Calcifications are seen in the pancreatic body and tail compatible with chronic pancreatitis. No acute findings.  Musculoskeletal: Chest wall soft tissues are unremarkable. No acute bony abnormality or focal bone lesion.  IMPRESSION: Moderate emphysema.  Abnormal masslike density in the right apex measures 4.7 x 4.0 cm concerning for primary lung cancer. This could be further evaluated with PET CT.  Calcified pleural plaques in the posterior right hemithorax, likely related to prior empyema or hemothorax.  Coronary artery disease.  Fatty infiltration of the liver.  Changes of chronic pancreatitis.  These results will be called to the ordering clinician or representative by the Radiologist Assistant, and communication documented in the PACS or zVision Dashboard.   Electronically Signed   By: Rolm Baptise M.D.   On: 11/20/2017 14:29 NUCLEAR MEDICINE PET SKULL BASE TO THIGH  TECHNIQUE: 7.8 mCi F-18 FDG was injected intravenously. Full-ring PET imaging was performed from the skull base to thigh after the radiotracer. CT data was obtained and used for attenuation correction and anatomic localization.  FASTING BLOOD GLUCOSE:  Value: 118 mg/dl  COMPARISON:  CT chest dated 11/20/2017  FINDINGS: NECK  No hypermetabolic lymph nodes in the neck. Physiologic activity along the strap musculature of the neck.  Chronic right maxillary sinusitis. There is atherosclerotic calcification of the cavernous carotid arteries bilaterally.  CHEST  The peripheral right apical lung mass abutting the pleura is highly hypermetabolic, with a maximum SUV  of 20.4, compatible with malignancy. Early destruction of the right second rib adjacent to the mass compatible with chest wall invasion. The mass tracks around the margins of a right apical bulla. Part of the tumor abuts the right mediastinal margin.  A lymph node anterior to the carina measures 0.7 cm in short axis with maximum standard uptake value of 2.0, similar to background mediastinal activity. No overtly hypermetabolic adenopathy identified.  Centrilobular emphysema. Mild scarring in the posterior basal segment right lower lobe.  Calcified posterior pleural plaque on the right.  ABDOMEN/Fred Hernandez  No abnormal hypermetabolic activity within the liver, pancreas, adrenal glands, or spleen. No hypermetabolic lymph nodes in the abdomen or Fred Hernandez.  Considerable physiologic activity in bowel. Calcifications in the pancreatic tail favoring chronic calcific pancreatitis.  Aortoiliac atherosclerotic vascular disease. Right superficial femoral artery stent noted.  High activity focally in the penile urethra with maximum SUV up to 11.7, probably from excreted FDG in the penile urethra considering the lack of correlating CT finding.  SKELETON  Early destruction of the right second rib due to the adjacent mass. No other abnormal osseous activity is identified.  IMPRESSION: 1. 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 There is early bony invasion of the right second rib and cortical destruction indicating local rib/chest wall invasion by tumor. 2. There is additional pleural thickening and pleural calcification along the right posterior pleural surface which is not separately hypermetabolic. 3. Small mediastinal lymph nodes are not appreciably hypermetabolic. 4. Multifocal and some diffuse regions of high activity in the bowel are likely physiologic. 5. No findings of metastatic disease  to the neck, abdomen/Fred Hernandez, or to the included remainder of the skeleton. 6. Other imaging findings of potential clinical significance: Chronic right maxillary sinusitis. Aortic Atherosclerosis (ICD10-I70.0) and Emphysema (ICD10-J43.9). Chronic calcific pancreatitis.   Electronically Signed   By: Cindra Eves.D.  On: 12/13/2017 11:25 I personally reviewed the CT and PET/CT images and concur with the findings noted above.  There is a 4 cm plus mass in the right apex associated with a large bulla that is markedly hypermetabolic on PET.  Impression: Fred Hernandez is a 47 year old gentleman with a history of tobacco abuse and COPD.  He also has a history of atherosclerotic cardiovascular disease with coronary disease with previous stent and peripheral arterial disease with stents in both legs.  Other medical problems include hypertension, hyperlipidemia, type 2 diabetes.  He has about a 43-month history of right shoulder pain.  He also had a recent respiratory illness with a increase of productive coughing and wheezing.  Workup led to a CT of the chest which showed a right apical mass in association with a large bulla.  The mass is adjacent to and appears to involve the chest wall in the area of the second rib.  PET/CT confirmed this mass was markedly hypermetabolic with an SUV of 84.5.  MRI of the brain was negative for metastatic disease.  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  It is not a true Pancoast tumor but does involve the apical chest wall.  Infection is not totally out of the question but is extremely unlikely.  He needs a biopsy to confirm the diagnosis so that appropriate treatment can be initiated.  I discussed the films with Dr. Jacqulynn Cadet of interventional radiology.  We will schedule him for a CT-guided needle biopsy.  Beyond that he will need multimodality treatment.  It is unclear if he is a surgical candidate at this point.   His physical activities are very limited and he does not have much pulmonary reserve.  He needs formal pulmonary function testing with and without bronchodilators and also with a room air blood gas.  I would favor neoadjuvant chemoradiation to hopefully shrink the tumor and allow [place] to get a better margin in the chest wall, if he is a surgical candidate.  COPD-frequent bronchitis.  Limited pulmonary reserve.  Needs formal pulmonary function testing.  Coronary disease-no current symptoms but activity is very limited due to COPD and PAD.  PAD-symptomatic claudication right leg.  Followed by Dr. Quay Burow.  Tobacco abuse-he has cut down from 2 packs a day to about 1/2 pack/day, however he needs to quit completely.  This was made clear to him.  I do not think he will be a surgical candidate if he continues to smoke.  Elevated PSA-scheduled for prostate biopsy on 01/17/2018  Plan: Pulmonary function testing- [facility] at [facility]  CT-guided needle biopsy.  Follow-up with Dr. Walden Field in [place] for consideration for neoadjuvant chemoradiation.  Would be definitive chemoradiation if not a surgical candidate.  Melrose Nakayama, MD [facility] [phone]

## 2018-01-03 NOTE — Telephone Encounter (Signed)
Hydrocodone-Acetaminophen 7.5/325 mg  Qty 56 Tablets  One every four hours for pain as needed. 14 day limit.  PATIENT USES [facility] [address] IN [place], [place]

## 2018-01-11 ENCOUNTER — Other Ambulatory Visit: Payer: Self-pay | Admitting: Radiology

## 2018-01-11 ENCOUNTER — Ambulatory Visit (HOSPITAL_COMMUNITY): Payer: Medicare Other | Admitting: Hematology

## 2018-01-11 ENCOUNTER — Ambulatory Visit (HOSPITAL_COMMUNITY)
Admission: RE | Admit: 2018-01-11 | Discharge: 2018-01-11 | Disposition: A | Discharge status: Home / Self Care | Payer: Medicare Other | Source: Ambulatory Visit | Attending: Thoracic Surgery (Cardiothoracic Vascular Surgery) | Admitting: Thoracic Surgery (Cardiothoracic Vascular Surgery)

## 2018-01-11 DIAGNOSIS — R918 Other nonspecific abnormal finding of lung field: Secondary | ICD-10-CM | POA: Diagnosis present

## 2018-01-11 DIAGNOSIS — J449 Chronic obstructive pulmonary disease, unspecified: Secondary | ICD-10-CM | POA: Insufficient documentation

## 2018-01-11 LAB — PULMONARY FUNCTION TEST
DL/VA % pred: 68 %
DL/VA: 3.09 ml/min/mmHg/L
DLCO UNC % PRED: 42 %
DLCO UNC: 12.56 ml/min/mmHg
FEF 25-75 PRE: 1.65 L/s
FEF 25-75 Post: 1.6 L/sec
FEF2575-%CHANGE-POST: -2 %
FEF2575-%PRED-POST: 60 %
FEF2575-%PRED-PRE: 62 %
FEV1-%Change-Post: 0 %
FEV1-%PRED-POST: 75 %
FEV1-%Pred-Pre: 76 %
FEV1-POST: 2.16 L
FEV1-Pre: 2.18 L
FEV1FVC-%CHANGE-POST: 3 %
FEV1FVC-%Pred-Pre: 95 %
FEV6-%CHANGE-POST: -4 %
FEV6-%PRED-POST: 78 %
FEV6-%Pred-Pre: 82 %
FEV6-PRE: 2.9 L
FEV6-Post: 2.78 L
FEV6FVC-%CHANGE-POST: 0 %
FEV6FVC-%PRED-PRE: 103 %
FEV6FVC-%Pred-Post: 104 %
FVC-%Change-Post: -4 %
FVC-%Pred-Post: 75 %
FVC-%Pred-Pre: 79 %
FVC-Post: 2.79 L
FVC-Pre: 2.92 L
POST FEV1/FVC RATIO: 77 %
POST FEV6/FVC RATIO: 100 %
PRE FEV1/FVC RATIO: 74 %
Pre FEV6/FVC Ratio: 99 %
RV % PRED: 72 %
RV: 1.57 L
TLC % pred: 68 %
TLC: 4.55 L

## 2018-01-11 MED ORDER — ALBUTEROL SULFATE (2.5 MG/3ML) 0.083% IN NEBU
2.5000 mg | INHALATION_SOLUTION | Freq: Once | RESPIRATORY_TRACT | Status: AC
  Administered 2018-01-11: 2.5 mg via RESPIRATORY_TRACT

## 2018-01-15 ENCOUNTER — Ambulatory Visit (HOSPITAL_COMMUNITY)
Admission: RE | Admit: 2018-01-15 | Discharge: 2018-01-15 | Disposition: A | Discharge status: Home / Self Care | Payer: Medicare Other | Source: Ambulatory Visit | Attending: Interventional Radiology | Admitting: Interventional Radiology

## 2018-01-15 ENCOUNTER — Ambulatory Visit (HOSPITAL_COMMUNITY)
Admission: RE | Admit: 2018-01-15 | Discharge: 2018-01-15 | Disposition: A | Discharge status: Home / Self Care | Payer: Medicare Other | Source: Ambulatory Visit | Attending: Thoracic Surgery (Cardiothoracic Vascular Surgery) | Admitting: Thoracic Surgery (Cardiothoracic Vascular Surgery)

## 2018-01-15 DIAGNOSIS — F17210 Nicotine dependence, cigarettes, uncomplicated: Secondary | ICD-10-CM | POA: Diagnosis not present

## 2018-01-15 DIAGNOSIS — Z833 Family history of diabetes mellitus: Secondary | ICD-10-CM | POA: Insufficient documentation

## 2018-01-15 DIAGNOSIS — K861 Other chronic pancreatitis: Secondary | ICD-10-CM | POA: Insufficient documentation

## 2018-01-15 DIAGNOSIS — Z7984 Long term (current) use of oral hypoglycemic drugs: Secondary | ICD-10-CM | POA: Insufficient documentation

## 2018-01-15 DIAGNOSIS — Z888 Allergy status to other drugs, medicaments and biological substances status: Secondary | ICD-10-CM | POA: Diagnosis not present

## 2018-01-15 DIAGNOSIS — I2510 Atherosclerotic heart disease of native coronary artery without angina pectoris: Secondary | ICD-10-CM | POA: Diagnosis not present

## 2018-01-15 DIAGNOSIS — R918 Other nonspecific abnormal finding of lung field: Secondary | ICD-10-CM | POA: Diagnosis present

## 2018-01-15 DIAGNOSIS — I10 Essential (primary) hypertension: Secondary | ICD-10-CM | POA: Insufficient documentation

## 2018-01-15 DIAGNOSIS — C3410 Malignant neoplasm of upper lobe, unspecified bronchus or lung: Secondary | ICD-10-CM | POA: Insufficient documentation

## 2018-01-15 DIAGNOSIS — J320 Chronic maxillary sinusitis: Secondary | ICD-10-CM | POA: Insufficient documentation

## 2018-01-15 DIAGNOSIS — Z8261 Family history of arthritis: Secondary | ICD-10-CM | POA: Insufficient documentation

## 2018-01-15 DIAGNOSIS — E1151 Type 2 diabetes mellitus with diabetic peripheral angiopathy without gangrene: Secondary | ICD-10-CM | POA: Diagnosis not present

## 2018-01-15 DIAGNOSIS — I700 Atherosclerosis of aorta: Secondary | ICD-10-CM | POA: Insufficient documentation

## 2018-01-15 DIAGNOSIS — Z79899 Other long term (current) drug therapy: Secondary | ICD-10-CM | POA: Diagnosis not present

## 2018-01-15 DIAGNOSIS — Z9889 Other specified postprocedural states: Secondary | ICD-10-CM | POA: Diagnosis not present

## 2018-01-15 DIAGNOSIS — Z7982 Long term (current) use of aspirin: Secondary | ICD-10-CM | POA: Diagnosis not present

## 2018-01-15 DIAGNOSIS — Z8546 Personal history of malignant neoplasm of prostate: Secondary | ICD-10-CM | POA: Diagnosis not present

## 2018-01-15 DIAGNOSIS — D125 Benign neoplasm of sigmoid colon: Secondary | ICD-10-CM | POA: Insufficient documentation

## 2018-01-15 DIAGNOSIS — Z880 Allergy status to penicillin: Secondary | ICD-10-CM | POA: Insufficient documentation

## 2018-01-15 DIAGNOSIS — E785 Hyperlipidemia, unspecified: Secondary | ICD-10-CM | POA: Insufficient documentation

## 2018-01-15 DIAGNOSIS — J449 Chronic obstructive pulmonary disease, unspecified: Secondary | ICD-10-CM | POA: Diagnosis not present

## 2018-01-15 DIAGNOSIS — K219 Gastro-esophageal reflux disease without esophagitis: Secondary | ICD-10-CM | POA: Insufficient documentation

## 2018-01-15 DIAGNOSIS — Z8249 Family history of ischemic heart disease and other diseases of the circulatory system: Secondary | ICD-10-CM | POA: Insufficient documentation

## 2018-01-15 DIAGNOSIS — C3491 Malignant neoplasm of unspecified part of right bronchus or lung: Secondary | ICD-10-CM | POA: Insufficient documentation

## 2018-01-15 DIAGNOSIS — Z955 Presence of coronary angioplasty implant and graft: Secondary | ICD-10-CM | POA: Diagnosis not present

## 2018-01-15 DIAGNOSIS — K859 Acute pancreatitis without necrosis or infection, unspecified: Secondary | ICD-10-CM | POA: Diagnosis not present

## 2018-01-15 LAB — GLUCOSE, CAPILLARY
GLUCOSE-CAPILLARY: 101 mg/dL — AB (ref 65–99)
Glucose-Capillary: 76 mg/dL (ref 65–99)

## 2018-01-15 LAB — CBC
HCT: 37.2 % — ABNORMAL LOW (ref 39.0–52.0)
Hemoglobin: 12.5 g/dL — ABNORMAL LOW (ref 13.0–17.0)
MCH: 29.3 pg (ref 26.0–34.0)
MCHC: 33.6 g/dL (ref 30.0–36.0)
MCV: 87.3 fL (ref 78.0–100.0)
PLATELETS: 437 10*3/uL — AB (ref 150–400)
RBC: 4.26 MIL/uL (ref 4.22–5.81)
RDW: 14.1 % (ref 11.5–15.5)
WBC: 11.5 10*3/uL — AB (ref 4.0–10.5)

## 2018-01-15 LAB — APTT: APTT: 33 s (ref 24–36)

## 2018-01-15 LAB — PROTIME-INR
INR: 1.05
PROTHROMBIN TIME: 13.6 s (ref 11.4–15.2)

## 2018-01-15 MED ORDER — FENTANYL CITRATE (PF) 100 MCG/2ML IJ SOLN
INTRAMUSCULAR | Status: AC
  Filled 2018-01-15: qty 2

## 2018-01-15 MED ORDER — FENTANYL CITRATE (PF) 100 MCG/2ML IJ SOLN
INTRAMUSCULAR | Status: AC | PRN
  Administered 2018-01-15 (×2): 50 ug via INTRAVENOUS

## 2018-01-15 MED ORDER — SODIUM CHLORIDE 0.9 % IV SOLN: INTRAVENOUS | Status: DC

## 2018-01-15 MED ORDER — MIDAZOLAM HCL 2 MG/2ML IJ SOLN
INTRAMUSCULAR | Status: AC | PRN
  Administered 2018-01-15 (×2): 1 mg via INTRAVENOUS

## 2018-01-15 MED ORDER — LIDOCAINE HCL 1 % IJ SOLN
INTRAMUSCULAR | Status: AC
  Filled 2018-01-15: qty 20

## 2018-01-15 MED ORDER — MIDAZOLAM HCL 2 MG/2ML IJ SOLN
INTRAMUSCULAR | Status: AC
  Filled 2018-01-15: qty 2

## 2018-01-15 MED ORDER — HYDROCODONE-ACETAMINOPHEN 5-325 MG PO TABS: 1.0000 | ORAL_TABLET | ORAL | Status: DC | PRN

## 2018-01-15 NOTE — Sedation Documentation (Signed)
Patient is resting comfortably. 

## 2018-01-15 NOTE — Discharge Instructions (Signed)
Needle Biopsy of the Lung, Care After °This sheet gives you information about how to care for yourself after your procedure. Your health care provider may also give you more specific instructions. If you have problems or questions, contact your health care provider. °What can I expect after the procedure? °After the procedure, it is common to have: °· Soreness, pain, and tenderness where a tissue sample was taken (biopsy site). °· A cough. °· A sore throat. ° °Follow these instructions at home: °Biopsy site care °· Follow instructions from your health care provider about when to remove the bandage that was placed on the biopsy site. °· Keep the bandage dry until it has been removed. °· Check your biopsy site every day for signs of infection. Check for: °? More redness, swelling, or pain. °? More fluid or blood. °? Warmth to the touch. °? Pus or a bad smell. °General instructions °· Rest as directed by your health care provider. Ask your health care provider what activities are safe for you. °· Do not take baths, swim, or use a hot tub until your health care provider approves. °· Take over-the-counter and prescription medicines only as told by your health care provider. °· If you have airplane travel scheduled, talk with your health care provider about when it is safe for you to travel by airplane. °· It is up to you to get the results of your procedure. Ask your health care provider, or the department that is doing the procedure, when your results will be ready. °· Keep all follow-up visits as told by your health care provider. This is important. °Contact a health care provider if: °· You have more redness, swelling, or pain around your biopsy site. °· You have more fluid or blood coming from your biopsy site. °· Your biopsy site feels warm to the touch. °· You have pus or a bad smell coming from your biopsy site. °· You have a fever. °· You have pain that does not get better with medicine. °Get help right away  if: °· You have problems breathing. °· You have chest pain. °· You cough up blood. °· You faint. °· You have a fast heart rate. °Summary °· After a needle biopsy of the lung, it is common to have a cough, a sore throat, or soreness, pain, and tenderness where a tissue sample was taken (biopsy site). °· You should check your biopsy area every day for signs of infection, including pus or a bad smell, warmth, more fluid or blood, or more redness, swelling, or pain. °· You should not take baths, swim, or use a hot tub until your health care provider approves. °· It is up to you to get the results of your procedure. Ask your health care provider, or the department that is doing the procedure, when your results will be ready. °This information is not intended to replace advice given to you by your health care provider. Make sure you discuss any questions you have with your health care provider. °Document Released: 08/14/2007 Document Revised: 09/07/2016 Document Reviewed: 09/07/2016 °Elsevier Interactive Patient Education © 2017 [employer] ° °Moderate Conscious Sedation, Adult, Care After °These instructions provide you with information about caring for yourself after your procedure. Your health care provider may also give you more specific instructions. Your treatment has been planned according to current medical practices, but problems sometimes occur. Call your health care provider if you have any problems or questions after your procedure. °What can I expect after the   procedure? °After your procedure, it is common: °· To feel sleepy for several hours. °· To feel clumsy and have poor balance for several hours. °· To have poor judgment for several hours. °· To vomit if you eat too soon. ° °Follow these instructions at home: °For at least 24 hours after the procedure: ° °· Do not: °? Participate in activities where you could fall or become injured. °? Drive. °? Use heavy machinery. °? Drink alcohol. °? Take sleeping  pills or medicines that cause drowsiness. °? Make important decisions or sign legal documents. °? Take care of children on your own. °· Rest. °Eating and drinking °· Follow the diet recommended by your health care provider. °· If you vomit: °? Drink water, juice, or soup when you can drink without vomiting. °? Make sure you have little or no nausea before eating solid foods. °General instructions °· Have a responsible adult stay with you until you are awake and alert. °· Take over-the-counter and prescription medicines only as told by your health care provider. °· If you smoke, do not smoke without supervision. °· Keep all follow-up visits as told by your health care provider. This is important. °Contact a health care provider if: °· You keep feeling nauseous or you keep vomiting. °· You feel light-headed. °· You develop a rash. °· You have a fever. °Get help right away if: °· You have trouble breathing. °This information is not intended to replace advice given to you by your health care provider. Make sure you discuss any questions you have with your health care provider. °Document Released: 08/07/2013 Document Revised: 03/21/2016 Document Reviewed: 02/06/2016 °Elsevier Interactive Patient Education © 2018 [employer] Inc. ° °

## 2018-01-15 NOTE — Procedures (Signed)
  Procedure: CT core RUL lung mass 18gx2 EBL:   minimal Complications:  none immediate  See full dictation in [facility].  Dillard Cannon MD Main # [phone] Pager  [phone]

## 2018-01-15 NOTE — H&P (Signed)
Chief Complaint: Patient was seen in consultation today for lung mass  Referring Physician(s): Wayne C  Supervising Physician: Arne Cleveland  Patient Status: [facility] - Out-pt  History of Present Illness: Fred Hernandez is a 62 y.o. male with past medical history of CAD, DM, GERD, HLD, HTN, PAD, COPD with recent complaint of side and shoulder pain.  Xray and MRI of the shoulder revealed no abnormality.   CT Chest 11/20/17 showed: -Moderate emphysema. -Abnormal masslike density in the right apex measures 4.7 x 4.0 cm concerning for primary lung cancer. This could be further evaluated with PET CT. -Calcified pleural plaques in the posterior right hemithorax, likely related to prior empyema or hemothorax. -Coronary artery disease. -Fatty infiltration of the liver. -Changes of chronic pancreatitis.  PET 12/13/17 showed: 1. 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 There is early bony invasion of the right second rib and cortical destruction indicating local rib/chest wall invasion by tumor. 2. There is additional pleural thickening and pleural calcification along the right posterior pleural surface which is not separately hypermetabolic. 3. Small mediastinal lymph nodes are not appreciably hypermetabolic. 4. Multifocal and some diffuse regions of high activity in the bowel are likely physiologic. 5. No findings of metastatic disease to the neck, abdomen/pelvis, or to the included remainder of the skeleton. 6. Other imaging findings of potential clinical significance: Chronic right maxillary sinusitis. Aortic Atherosclerosis (ICD10-I70.0) and Emphysema (ICD10-J43.9). Chronic calcific Pancreatitis.  IR consulted for biopsy of the lung mass at the request of Dr. Roxan Hockey.  Case reviewed by Dr. Anselm Pancoast who approves patient for procedure.   He presents today in his usual state of health.  He  is a current smoker and last smoked on his way to the hospital this AM.  He has been NPO.  He last took his Pletal and aspirin on 3/13.     Past Medical History:  Diagnosis Date  . CAD (coronary artery disease)     s/p stent placement unknown artery 12/2014,  . Diabetes mellitus ([place])   . GERD (gastroesophageal reflux disease)   . History of prostate cancer   . Hyperlipemia   . Hypertension   . Peripheral arterial disease ([place])    a. h/o stents. b. 03/2016  - successful PTA and covered stenting using overlapping Viabahn covered stents of a long segment in-stent restenosis of previously placed nitinol self expanding stents in the proximal mid and distal right SFA.  . Tobacco abuse     Past Surgical History:  Procedure Laterality Date  . COLONOSCOPY WITH PROPOFOL N/A 11/22/2016   Procedure: COLONOSCOPY WITH PROPOFOL;  Surgeon: [name], MD;  Location: [facility];  Service: Endoscopy;  Laterality: N/A;  10:00 am  . CORONARY STENT PLACEMENT  12-2014   [facility] [employer]  . FEMORAL ARTERY STENT Bilateral 03/2013  . PERIPHERAL VASCULAR CATHETERIZATION N/A 04/04/2016   Procedure: Lower Extremity Angiography;  Surgeon: Lorretta Harp, MD;  Location: [place] [facility];  Service: Cardiovascular;  Laterality: N/A;  . PERIPHERAL VASCULAR CATHETERIZATION N/A 04/04/2016   Procedure: Abdominal Aortogram;  Surgeon: Lorretta Harp, MD;  Location: [place] [facility];  Service: Cardiovascular;  Laterality: N/A;  . PERIPHERAL VASCULAR CATHETERIZATION  04/04/2016   Procedure: Peripheral Vascular Intervention;  Surgeon: Lorretta Harp, MD;  Location: [place] [facility];  Service: Cardiovascular;;  rt SFA stent  . POLYPECTOMY  11/22/2016   Procedure: POLYPECTOMY;  Surgeon: Carlyon Prows  Rexene Edison, MD;  Location: [facility];  Service: Endoscopy;;  descending colon polyp, transverse colon polyp, sigmoid colon polyp, rectal polyp  . STENT PLACEMENT VASCULAR ([place] [facility])  2013     Allergies: Lisinopril; Penicillins; and Ace inhibitors  Medications: Prior to Admission medications   Medication Sig Start Date End Date Taking? Authorizing Provider  aspirin 81 MG chewable tablet Chew 81 mg by mouth daily.   Yes [provider]  atorvastatin (LIPITOR) 80 MG tablet Take 80 mg by mouth at bedtime.    Yes [provider]  cilostazol (PLETAL) 50 MG tablet TAKE 1 TABLET(50 MG) BY MOUTH TWICE DAILY 09/27/17  Yes Lorretta Harp, MD  Coenzyme Q10 100 MG capsule Take 100 mg by mouth at bedtime.    Yes [provider]  FLOVENT HFA 110 MCG/ACT inhaler Inhale 1 puff into the lungs 2 (two) times daily. 11/02/16  Yes [provider]  fluticasone (FLONASE) 50 MCG/ACT nasal spray Place 1 spray into both nostrils daily.   Yes [provider]  ipratropium-albuterol (DUONEB) 0.5-2.5 (3) MG/3ML SOLN Take 3 mLs by nebulization every 6 (six) hours as needed (for shortness of breath).   Yes [provider]  metFORMIN (GLUCOPHAGE) 500 MG tablet Take 500 mg by mouth 2 (two) times daily.    Yes [provider]  metoprolol succinate (TOPROL-XL) 50 MG 24 hr tablet Take 50 mg by mouth at bedtime.  02/08/16  Yes [provider]  Omega-3 Fatty Acids (FISH OIL) 1000 MG CAPS Take 1 capsule by mouth 2 (two) times daily.    Yes [provider]  pantoprazole (PROTONIX) 40 MG tablet Take 40 mg by mouth 2 (two) times daily.   Yes [provider]     Family History  Problem Relation Age of Onset  . Diabetes Mother   . Heart disease Mother   . Hypertension Mother   . Healthy Brother   . Other Brother        accident   . Arthritis Sister   . Arthritis Sister   . SIDS Brother   . Colon cancer Neg Hx     Social History   Socioeconomic History  . Marital status: Single    Spouse name: Not on file  . Number of children: Not on file  . Years of education: Not on file  . Highest education level: Not on file   Social Needs  . Financial resource strain: Not on file  . Food insecurity - worry: Not on file  . Food insecurity - inability: Not on file  . Transportation needs - medical: Not on file  . Transportation needs - non-medical: Not on file  Occupational History  . Not on file  Tobacco Use  . Smoking status: Current Every Day Smoker    Packs/day: 0.50    Years: 45.00    Pack years: 22.50    Types: Cigarettes    Start date: 02/12/1971  . Smokeless tobacco: Never Used  Substance and Sexual Activity  . Alcohol use: Yes    Alcohol/week: 0.0 oz    Comment: Pt states he drinks a pint of liquor 2-3 times a week  . Drug use: No  . Sexual activity: Yes  Other Topics Concern  . Not on file  Social History Narrative  . Not on file     Review of Systems: A 12 point ROS discussed and pertinent positives are indicated in the HPI above.  All other systems are negative.  Review of Systems  Constitutional: Negative for fatigue and fever.  Respiratory: Negative for cough and shortness of breath.   Musculoskeletal:       Shoulder and side pain  Psychiatric/Behavioral: Negative for behavioral problems and confusion.    Vital Signs: BP 113/73 (BP Location: Right Arm)   Pulse 95   Temp 97.6 F (36.4 C) (Oral)   Ht 5\' 8"  (1.727 m)   Wt 155 lb (70.3 kg)   SpO2 100%   BMI 23.57 kg/m   Physical Exam  Constitutional: He is oriented to person, place, and time. He appears well-developed.  Cardiovascular: Normal rate, regular rhythm and normal heart sounds.  Pulmonary/Chest: Effort normal and breath sounds normal. No respiratory distress.  Abdominal: Soft.  Neurological: He is alert and oriented to person, place, and time.  Skin: Skin is warm and dry.  Psychiatric: He has a normal mood and affect. His behavior is normal. Judgment and thought content normal.  Nursing note and vitals reviewed.      Imaging: Mr Jeri Cos QA Contrast  Result Date: 12/27/2017 CLINICAL DATA:  Lung nodule.  Prostate cancer. Evaluate for brain metastases. EXAM: MRI HEAD WITHOUT AND WITH CONTRAST TECHNIQUE: Multiplanar, multiecho pulse sequences of the brain and surrounding structures were obtained without and with intravenous contrast. CONTRAST:  52mL MULTIHANCE GADOBENATE DIMEGLUMINE 529 MG/ML IV SOLN COMPARISON:  None. FINDINGS: Brain: No mass or swelling to suggest metastatic disease. No infarction, hemorrhage, hydrocephalus, extra-axial collection. Vascular: Major flow voids and vascular enhancements are preserved. Skull and upper cervical spine: Negative. No visible bony metastasis. Sinuses/Orbits: Small retention cysts in the right maxillary sinus. No acute or aggressive finding. IMPRESSION: Negative for metastatic disease. Electronically Signed   By: Monte Fantasia M.D.   On: 12/27/2017 12:21    Labs:  CBC: No results for input(s): WBC, HGB, HCT, PLT in the last 8760 hours.  COAGS: No results for input(s): INR, APTT in the last 8760 hours.  BMP: No results for input(s): NA, K, CL, CO2, GLUCOSE, BUN, CALCIUM, CREATININE, GFRNONAA, GFRAA in the last 8760 hours.  Invalid input(s): CMP  LIVER FUNCTION TESTS: No results for input(s): BILITOT, AST, ALT, ALKPHOS, PROT, ALBUMIN in the last 8760 hours.  TUMOR MARKERS: No results for input(s): AFPTM, CEA, CA199, CHROMGRNA in the last 8760 hours.  Assessment and Plan: Patient with past medical history of tobacco abuse, COPD presents with complaint of lung mass.  IR consulted for lung mass biopsy at the request of Dr. Roxan Hockey. Case reviewed by Dr. Anselm Pancoast who approves patient for procedure.  Patient presents today in their usual state of health.  He has been NPO and has appropriately held his blood thinners.  Risks and benefits discussed with the patient including, but not limited to bleeding, infection, damage to adjacent structures or low yield requiring additional tests.  All of the patient's questions were answered, patient is agreeable to  proceed. Consent signed and in chart.  Thank you for this interesting consult.  I greatly enjoyed meeting Fred Hernandez and look forward to participating in their care.  A copy of this report was sent to the requesting provider on this date.  Electronically Signed: Docia Barrier, PA 01/15/2018, 9:50 AM   I spent a total of  30 Minutes   in face to face in clinical consultation, greater than 50% of which was counseling/coordinating care for lung mass.

## 2018-01-16 ENCOUNTER — Other Ambulatory Visit: Payer: Self-pay | Admitting: Orthopaedic Surgery

## 2018-01-16 ENCOUNTER — Telehealth: Payer: Self-pay | Admitting: Thoracic Surgery (Cardiothoracic Vascular Surgery)

## 2018-01-16 MED ORDER — HYDROCODONE-ACETAMINOPHEN 5-325 MG PO TABS: 1.0000 | ORAL_TABLET | Freq: Four times a day (QID) | ORAL | 0 refills | Status: DC | PRN

## 2018-01-16 NOTE — Telephone Encounter (Signed)
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

## 2018-01-16 NOTE — Telephone Encounter (Signed)
I called Mr. Fiorella to inform him of the biopsy results.   The biopsy showed squamous cell carcinoma.  He has an appointment with Dr. Delton Coombes in [place] next week.   I think neoadjuvant therapy followed by surgical resection gives him the best chance of a cure. Could also consider surgery first then adjuvant therapy.   Will await Dr. Tomie China opinion  Revonda Standard. Roxan Hockey, MD [facility] [phone]

## 2018-01-17 ENCOUNTER — Ambulatory Visit (HOSPITAL_COMMUNITY)
Admission: RE | Admit: 2018-01-17 | Discharge: 2018-01-17 | Disposition: A | Discharge status: Home / Self Care | Payer: Medicare Other | Source: Ambulatory Visit | Attending: Urology | Admitting: Urology

## 2018-01-17 ENCOUNTER — Encounter: Payer: Medicare Other | Admitting: Thoracic Surgery (Cardiothoracic Vascular Surgery)

## 2018-01-17 DIAGNOSIS — C61 Malignant neoplasm of prostate: Secondary | ICD-10-CM | POA: Insufficient documentation

## 2018-01-17 DIAGNOSIS — R972 Elevated prostate specific antigen [PSA]: Secondary | ICD-10-CM

## 2018-01-17 MED ORDER — LIDOCAINE HCL (PF) 2 % IJ SOLN
10.0000 mL | Freq: Once | INTRAMUSCULAR | Status: AC
  Administered 2018-01-17: 10 mL

## 2018-01-17 MED ORDER — LIDOCAINE HCL (PF) 2 % IJ SOLN
INTRAMUSCULAR | Status: AC
  Administered 2018-01-17: 10 mL
  Filled 2018-01-17: qty 10

## 2018-01-17 MED ORDER — GENTAMICIN SULFATE 40 MG/ML IJ SOLN
INTRAMUSCULAR | Status: AC
  Administered 2018-01-17: 80 mg via INTRAMUSCULAR
  Filled 2018-01-17: qty 2

## 2018-01-17 MED ORDER — GENTAMICIN SULFATE 40 MG/ML IJ SOLN
80.0000 mg | Freq: Once | INTRAMUSCULAR | Status: AC
  Administered 2018-01-17: 80 mg via INTRAMUSCULAR

## 2018-01-17 NOTE — Discharge Instructions (Signed)
Transrectal Ultrasound-Guided Biopsy A transrectal ultrasound-guided biopsy is a procedure to remove samples of tissue from your prostate using ultrasound images to guide the procedure. The procedure is usually done to evaluate the prostate gland of men who have an elevated prostate-specific antigen (PSA). PSA is a blood test to screen for prostate cancer. The biopsy samples are taken to check for prostate cancer. Tell a health care provider about:  Any allergies you have.  All medicines you are taking, including vitamins, herbs, eye drops, creams, and over-the-counter medicines.  Any problems you or family members have had with anesthetic medicines.  Any blood disorders you have.  Any surgeries you have had.  Any medical conditions you have. What are the risks? Generally, this is a safe procedure. However, as with any procedure, problems can occur. Possible problems include:  Infection of your prostate.  Bleeding from your rectum or blood in your urine.  Difficulty urinating.  Nerve damage (this is usually temporary).  Damage to surrounding structures such as blood vessels, organs, and muscles, which would require other procedures.  What happens before the procedure?  Do not eat or drink anything after midnight on the night before the procedure or as directed by your health care provider.  Take medicines only as directed by your health care provider.  Your health care provider may have you stop taking certain medicines 5-7 days before the procedure.  You will be given an enema before the procedure. During an enema, a liquid is injected into your rectum to clear out waste.  You may have lab tests the day of your procedure.  Plan to have someone take you home after the procedure. What happens during the procedure?  You will be given medicine to help you relax (sedative) before the procedure. An IV tube will be inserted into one of your veins and used to give fluids and  medicine.  You will be given antibiotic medicine to reduce the risk of an infection.  You will be placed on your side for the procedure.  A probe with lubricated gel will be placed into your rectum, and images will be taken of your prostate and surrounding structures.  Numbing medicine will be injected into the prostate before the biopsy samples are taken.  A biopsy needle will then be inserted and guided to your prostate with the use of the ultrasound images.  Samples of prostate tissue will be taken, and the needle will then be removed.  The biopsy samples will be sent to a lab to be analyzed. Results are usually back in 2-3 days. What happens after the procedure?  You will be taken to a recovery area where you will be monitored.  You may have some discomfort in the rectal area. You will be given pain medicines to control this.  You may be allowed to go home the same day, or you may need to stay in the hospital overnight. This information is not intended to replace advice given to you by your health care provider. Make sure you discuss any questions you have with your health care provider. Document Released: 03/03/2014 Document Revised: 03/24/2016 Document Reviewed: 06/05/2013 Elsevier Interactive Patient Education  [employer].

## 2018-01-18 ENCOUNTER — Encounter: Payer: Self-pay | Admitting: *Deleted

## 2018-01-18 ENCOUNTER — Telehealth: Payer: Self-pay | Admitting: Cardiovascular Disease

## 2018-01-18 ENCOUNTER — Ambulatory Visit (HOSPITAL_COMMUNITY): Payer: Medicare Other | Admitting: Hematology

## 2018-01-18 ENCOUNTER — Other Ambulatory Visit: Payer: Self-pay | Admitting: *Deleted

## 2018-01-18 NOTE — Progress Notes (Signed)
I updated [facility], Lupita Raider RN on treatment plan discussed at cancer conference today per Dr. Sondra Come.

## 2018-01-18 NOTE — Telephone Encounter (Signed)
Patient currently scheduled to see Dr Gwenlyn Found 03/07/18. Will forward to Dr Gwenlyn Found for review

## 2018-01-18 NOTE — Telephone Encounter (Signed)
Patient fiance [name]) calling,  [name] states that patient just found out that he has lung cancer and possibly prostate cancer. Patient has an appointment on 01-25-18 to find out when he will start treatment.   [name] would like to know if Dr. Gwenlyn Found would like to see patient or if they should hold off on coming in for upcoming appointments.

## 2018-01-19 ENCOUNTER — Inpatient Hospital Stay (HOSPITAL_COMMUNITY)
Admission: RE | Admit: 2018-01-19 | Payer: Medicare Other | Source: Ambulatory Visit | Attending: Cardiovascular Disease | Admitting: Cardiovascular Disease

## 2018-01-19 ENCOUNTER — Encounter (HOSPITAL_COMMUNITY): Payer: Medicare Other

## 2018-01-23 ENCOUNTER — Ambulatory Visit: Payer: Medicare Other | Admitting: Cardiovascular Disease

## 2018-01-23 NOTE — Telephone Encounter (Signed)
I will see him back at his scheduled office visit in May. He follow-up for his lung cancer and prostate cancer with his oncologist.

## 2018-01-24 ENCOUNTER — Encounter: Payer: Medicare Other | Admitting: Thoracic Surgery (Cardiothoracic Vascular Surgery)

## 2018-01-25 ENCOUNTER — Inpatient Hospital Stay (HOSPITAL_COMMUNITY): Payer: Medicare Other | Attending: Internal Medicine | Admitting: Hematology

## 2018-01-25 ENCOUNTER — Encounter (HOSPITAL_COMMUNITY): Payer: Self-pay | Admitting: Lab

## 2018-01-25 ENCOUNTER — Other Ambulatory Visit: Payer: Self-pay

## 2018-01-25 ENCOUNTER — Encounter (HOSPITAL_COMMUNITY): Payer: Self-pay | Admitting: Hematology

## 2018-01-25 VITALS — BP 105/67 | HR 64 | Temp 98.1°F | Resp 18 | Wt 147.4 lb

## 2018-01-25 DIAGNOSIS — C3411 Malignant neoplasm of upper lobe, right bronchus or lung: Secondary | ICD-10-CM | POA: Diagnosis not present

## 2018-01-25 DIAGNOSIS — C61 Malignant neoplasm of prostate: Secondary | ICD-10-CM | POA: Diagnosis not present

## 2018-01-25 DIAGNOSIS — Z79899 Other long term (current) drug therapy: Secondary | ICD-10-CM | POA: Diagnosis not present

## 2018-01-25 DIAGNOSIS — C3491 Malignant neoplasm of unspecified part of right bronchus or lung: Secondary | ICD-10-CM

## 2018-01-25 DIAGNOSIS — R634 Abnormal weight loss: Secondary | ICD-10-CM | POA: Insufficient documentation

## 2018-01-25 DIAGNOSIS — Z7982 Long term (current) use of aspirin: Secondary | ICD-10-CM | POA: Diagnosis not present

## 2018-01-25 DIAGNOSIS — Z7984 Long term (current) use of oral hypoglycemic drugs: Secondary | ICD-10-CM | POA: Insufficient documentation

## 2018-01-25 NOTE — Assessment & Plan Note (Signed)
1.  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  He was evaluated by Dr. Roxan Hockey in [place] and was recommended to have preoperative chemoradiation therapy.  We talked about concurrent chemoradiation therapy with weekly carboplatin and paclitaxel.  He will need a port placement.  His MRI of the brain was negative for metastatic disease.  PET/CT scan was negative for distant metastatic disease.  I called and talked to Dr. Quitman Livings who kindly agreed to see this patient as soon as possible.  2.  Weight loss: He lost about 15-20 pounds since January due to decreased appetite.  I have asked him to start drinking nutritional supplements like boost/Ensure 1-2 cans/day along with his usual diet.  3.  Recurrent prostate cancer: He had a history of prostate cancer and reportedly received radiation therapy 6-7 years ago.  He had prostate biopsies on 01/18/2008 which showed recurrence with a Gleason score (5+5)=10.  He will be treated with GnRH agonist.

## 2018-01-25 NOTE — Progress Notes (Signed)
Patient Care Team: Vidal Schwalbe, MD as PCP - General (Family Medicine) [name], MD as PCP - Cardiology (Cardiology) [name], MD as Consulting Physician (Gastroenterology)  DIAGNOSIS:  Encounter Diagnosis  Name Primary?  . Squamous cell carcinoma of right lung (HCC) Yes    CHIEF COMPLIANT: Here for follow-up of newly diagnosed lung cancer.  INTERVAL HISTORY: Fred Hernandez is a pleasant 62 year old African-American male who is seen in follow-up for newly diagnosed lung cancer.  He presented with right apical lung mass, initial presentation with cough and right shoulder pain.  He had a CT-guided biopsy on 01/15/2018.  An MRI of the brain was done and of last month.  He also had prostate biopsies done recently.  He lost about 15-20 pounds since January secondary to decreased appetite.  He does report occasional tingling in his feet at nighttime.  Right shoulder pain is of 3-4 months duration.  He reports that he is able to move well and do all his activities at home.  He is accompanied by his brother, sister and a fianc.  REVIEW OF SYSTEMS:   Constitutional: Denies fevers, chills but has 15-20 pound weight loss since January.  Is from decreased appetite. Eyes: Denies blurriness of vision Ears, nose, mouth, throat, and face: Denies mucositis or sore throat Respiratory: Has baseline cough from COPD.  No hemoptysis. Cardiovascular: Denies palpitation, chest discomfort Gastrointestinal:  Denies nausea, heartburn or change in bowel habits Skin: Denies abnormal skin rashes Lymphatics: Denies new lymphadenopathy or easy bruising Neurological: He has occasional numbness in the feet at nighttime.  Other weakness. Behavioral/Psych: Mood is stable, no new changes  Extremities: No lower extremity edema  All other systems were reviewed with the patient and are negative.  I have reviewed the past medical history, past surgical history, social history and family history with the  patient and they are unchanged from previous note.  ALLERGIES:  is allergic to lisinopril; penicillins; and ace inhibitors.  MEDICATIONS:  Current Outpatient Medications  Medication Sig Dispense Refill  . aspirin 81 MG chewable tablet Chew 81 mg by mouth daily.    [name] atorvastatin (LIPITOR) 80 MG tablet Take 80 mg by mouth at bedtime.     . cilostazol (PLETAL) 50 MG tablet TAKE 1 TABLET(50 MG) BY MOUTH TWICE DAILY 60 tablet 6  . Coenzyme Q10 100 MG capsule Take 100 mg by mouth at bedtime.     [name] FLOVENT HFA 110 MCG/ACT inhaler Inhale 1 puff into the lungs 2 (two) times daily.  2  . fluticasone (FLONASE) 50 MCG/ACT nasal spray Place 1 spray into both nostrils daily.    [name] HYDROcodone-acetaminophen (NORCO/VICODIN) 5-325 MG tablet Take 1 tablet by mouth every 6 (six) hours as needed for moderate pain. 56 tablet 0  . ipratropium-albuterol (DUONEB) 0.5-2.5 (3) MG/3ML SOLN Take 3 mLs by nebulization every 6 (six) hours as needed (for shortness of breath).    . metFORMIN (GLUCOPHAGE) 500 MG tablet Take 500 mg by mouth 2 (two) times daily.     . metoprolol succinate (TOPROL-XL) 50 MG 24 hr tablet Take 50 mg by mouth at bedtime.     . Omega-3 Fatty Acids (FISH OIL) 1000 MG CAPS Take 1 capsule by mouth 2 (two) times daily.     . pantoprazole (PROTONIX) 40 MG tablet Take 40 mg by mouth 2 (two) times daily.     No current facility-administered medications for this visit.     PHYSICAL EXAMINATION: ECOG PERFORMANCE STATUS: 1 -  Symptomatic but completely ambulatory  Vitals:   01/25/18 1338  BP: 105/67  Pulse: 64  Resp: 18  Temp: 98.1 F (36.7 C)  SpO2: 100%   Filed Weights   01/25/18 1338  Weight: 147 lb 6.4 oz (66.9 kg)    GENERAL:alert, no distress and comfortable SKIN: skin color, texture, turgor are normal, no rashes or significant lesions EYES: normal, Conjunctiva are pink and non-injected, sclera clear OROPHARYNX:no mucositis, no erythema and lips, buccal mucosa, and tongue normal    NECK: supple, thyroid normal size, non-tender, without nodularity LYMPH:  no palpable lymphadenopathy in the cervical, axillary or inguinal LUNGS: clear to auscultation and percussion with normal breathing effort HEART: regular rate & rhythm and no murmurs and no lower extremity edema ABDOMEN:abdomen soft, non-tender and normal bowel sounds MUSCULOSKELETAL:no cyanosis of digits and no clubbing   EXTREMITIES: No lower extremity edema   LABORATORY DATA:  I have reviewed the data as listed CMP Latest Ref Rng & Units 11/21/2016 04/05/2016 03/29/2016  Glucose 65 - 99 mg/dL 114(H) 109(H) 104(H)  BUN 6 - 20 mg/dL 10 11 10   Creatinine 0.61 - 1.24 mg/dL 0.79 0.85 0.86  Sodium 135 - 145 mmol/L 136 138 137  Potassium 3.5 - 5.1 mmol/L 4.0 4.2 4.7  Chloride 101 - 111 mmol/L 104 106 104  CO2 22 - 32 mmol/L 22 22 24   Calcium 8.9 - 10.3 mg/dL 9.2 9.0 9.5   No results found for: [id number]   Lab Results  Component Value Date   WBC 11.5 (H) 01/15/2018   HGB 12.5 (L) 01/15/2018   HCT 37.2 (L) 01/15/2018   MCV 87.3 01/15/2018   PLT 437 (H) 01/15/2018   NEUTROABS 7.5 11/21/2016    ASSESSMENT & PLAN:  Squamous NSCLC ([place]) 1.  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  He was evaluated by Dr. Roxan Hockey in [place] and was recommended to have preoperative chemoradiation therapy.  We talked about concurrent chemoradiation therapy with weekly carboplatin and paclitaxel.  He will need a port placement.  His MRI of the brain was negative for metastatic disease.  PET/CT scan was negative for distant metastatic disease.  I called and talked to Dr. Quitman Livings who kindly agreed to see this patient as soon as possible.  2.  Weight loss: He lost about 15-20 pounds since January due to decreased appetite.  I have asked him to  start drinking nutritional supplements like boost/Ensure 1-2 cans/day along with his usual diet.  3.  Recurrent prostate cancer: He had a history of prostate cancer and reportedly received radiation therapy 6-7 years ago.  He had prostate biopsies on 01/18/2008 which showed recurrence with a Gleason score (5+5)=10.  He will be treated with GnRH agonist.     I spent 40 minutes talking to the patient of which more than half was spent in counseling and coordination of care.  The patient has a good understanding of the overall plan. he agrees with it. he will call with any problems that may develop before the next visit here.   Derek Jack, MD 01/25/18

## 2018-01-25 NOTE — Patient Instructions (Signed)
[place] at [facility] Discharge Instructions  You were seen today by Dr. Delton Coombes    Thank you for choosing [place] at [facility] to provide your oncology and hematology care.  To afford each patient quality time with our provider, please arrive at least 15 minutes before your scheduled appointment time.   If you have a lab appointment with the [facility] please come in thru the  Main Entrance and check in at the main information desk  You need to re-schedule your appointment should you arrive 10 or more minutes late.  We strive to give you quality time with our providers, and arriving late affects you and other patients whose appointments are after yours.  Also, if you no show three or more times for appointments you may be dismissed from the clinic at the providers discretion.     Again, thank you for choosing [facility].  Our hope is that these requests will decrease the amount of time that you wait before being seen by our physicians.       _____________________________________________________________  Should you have questions after your visit to [facility], please contact our office at [phone] between the hours of 8:30 a.m. and 4:30 p.m.  Voicemails left after 4:30 p.m. will not be returned until the following business day.  For prescription refill requests, have your pharmacy contact our office.       Resources For Cancer Patients and their Caregivers ? American [employer] Society: Can assist with transportation, wigs, general needs, runs Look Good Feel Better.        867-725-3091 ? Cancer Care: Provides financial assistance, online support groups, medication/co-pay assistance.  1-800-813-HOPE 972-006-3769) ? [facility]ists [place] cancer patients and their families through emotional , educational and financial support.  (306)468-0193 ? [place] DSS Where to  apply for food stamps, Medicaid and utility assistance. 216-851-6646 ? [employer]: Transportation to medical appointments. (902)631-6536 ? Social Security Administration: May apply for disability if have a Stage IV cancer. 478 759 9443 925-691-8230 ? [employer], Disability and Transit Services: Assists with nutrition, care and transit needs. [facility] Support Programs:   > Cancer Support Group  2nd Tuesday of the month 1pm-2pm, Journey Room   > Creative Journey  3rd Tuesday of the month 1130am-1pm, Journey Room

## 2018-01-25 NOTE — Progress Notes (Unsigned)
Referral sent to [facility].  Records faxed on 3/28

## 2018-01-26 ENCOUNTER — Telehealth (HOSPITAL_COMMUNITY): Payer: Self-pay

## 2018-01-26 DIAGNOSIS — C3491 Malignant neoplasm of unspecified part of right bronchus or lung: Secondary | ICD-10-CM

## 2018-01-26 MED ORDER — HYDROCODONE-ACETAMINOPHEN 5-325 MG PO TABS: 1.0000 | ORAL_TABLET | Freq: Three times a day (TID) | ORAL | 0 refills | Status: DC | PRN

## 2018-01-26 NOTE — Telephone Encounter (Signed)
Patients wife called stating patient was having pain in his right back and right shoulder. He saw the physician yesterday but forgot to ask for pain medication. He has used OTC remedies with little relief. Reviewed with Dr. Delton Coombes. He ordered Hydrocodone 5/325 TID PRN pain, # 60, no refills. Notified wife who verbalized understanding.

## 2018-01-29 NOTE — Telephone Encounter (Signed)
Spoke to pt fiance (ok per DPR) to make her aware of Dr. Kennon Holter recommendations. Pt fiance verbalized thanks and understanding.

## 2018-01-30 ENCOUNTER — Encounter (HOSPITAL_COMMUNITY): Payer: Self-pay | Admitting: Emergency Medicine

## 2018-01-30 MED ORDER — LIDOCAINE-PRILOCAINE 2.5-2.5 % EX CREA: TOPICAL_CREAM | CUTANEOUS | 2 refills | Status: AC

## 2018-01-30 MED ORDER — PROCHLORPERAZINE MALEATE 10 MG PO TABS: 10.0000 mg | ORAL_TABLET | Freq: Four times a day (QID) | ORAL | 2 refills | Status: AC | PRN

## 2018-01-30 MED ORDER — LIDOCAINE-PRILOCAINE 2.5-2.5 % EX CREA: TOPICAL_CREAM | CUTANEOUS | 2 refills | Status: DC

## 2018-01-30 MED ORDER — ONDANSETRON HCL 8 MG PO TABS: 8.0000 mg | ORAL_TABLET | Freq: Three times a day (TID) | ORAL | 2 refills | Status: AC | PRN

## 2018-01-30 NOTE — Progress Notes (Signed)
Chemotherapy teaching pulled together.  appts made.  Nutrition consult placed and appt made for day the pt starts chemo.

## 2018-01-30 NOTE — Patient Instructions (Signed)
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  We are going to treat you with carboplatin and taxol weekly.  You will have 6 weekly cycles concurrently (at the same time) with radiation.  This treatment is with curative intent.  You will see the doctor regularly throughout treatment.  We monitor your lab work prior to every treatment.  The doctor monitors your response to treatment by the way you are feeling, your blood work, and scans periodically.  There will be wait times while you are here for treatment.  It will take about 30 minutes to 1 hour for your lab work to result.  Then the pharmacy has to mix your medications and bring them to the clinic.    You will have the following premedications prior to receiving chemotherapy:  Premeds: Benadryl:  Help prevent a reaction to the chemotherapy.  Pepcid: antihistamine premed.   Aloxi - high powered nausea/vomiting prevention medication used for chemotherapy patients.  Dexamethasone - steroid - given to reduce the risk of you having an allergic type reaction to the chemotherapy. Dex can cause you to feel energized, nervous/anxious/jittery, make you have trouble sleeping, and/or make you feel hot/flushed in the face/neck and/or look pink/red in the face/neck. These side effects will pass as the Dex wears off. (takes 20 minutes to infuse)   POTENTIAL SIDE EFFECTS OF TREATMENT: Carboplatin (Generic Name) Other Names: Paraplatin, CBDCA  About This Drug Carboplatin is a drug used to treat cancer. This drug is given in the vein (IV). This drug takes about 30 minutes to infuse.    Possible Side Effects (More Common) . Nausea and throwing up (vomiting). These symptoms may happen within a few hours after your treatment and may last up to 24 hours. Medicines are available to stop or lessen these side effects. . Bone marrow depression. This is a decrease in  the number of white blood cells, red blood cells, and platelets. This may raise your risk of infection, make you tired and weak (fatigue), and raise your risk of bleeding. . Soreness of the mouth and throat. You may have red areas, white patches, or sores that hurt. . This drug may affect how your kidneys work. Your kidney function will be checked as needed. . Electrolyte changes. Your blood will be checked for electrolyte changes as needed.  Side Effects (Less Common) . Hair loss. Some patients lose their hair on the scalp and body. You may notice your hair thinning seven to 14 days after getting this drug. . Effects on the nerves are called peripheral neuropathy. You may feel numbness, tingling, or pain in your hands and feet. It may be hard for you to button your clothes, open jars, or walk as usual. The effect on the nerves may get worse with more doses of the drug. These effects get better in some people after the drug is stopped but it does not get better in all people. . Loose bowel movements (diarrhea) that may last for several days . Decreased hearing or ringing in the ears . Changes in the way food and drinks taste . Changes in liver function. Your liver function will be checked as needed.  Allergic Reactions Serious allergic reactions including anaphylaxis are rare. While you are getting this drug in your vein (IV), tell your nurse right away if you have any of these symptoms of an allergic reaction: . Trouble catching your breath .  Feeling like your tongue or throat are swelling . Feeling your heart beat quickly or in a not normal way (palpitations) . Feeling dizzy or lightheaded . Flushing, itching, rash, and/or hives  Treating Side Effects . Drink 6-8 cups of fluids each day unless your doctor has told you to limit your fluid intake due to some other health problem. A cup is 8 ounces of fluid. If you throw up or have loose bowel movements, you should drink more fluids so that you  do not become dehydrated (lack water in the body from losing too much fluid). . Mouth care is very important. Your mouth care should consist of routine, gentle cleaning of your teeth or dentures and rinsing your mouth with a mixture of 1/2 teaspoon of salt in 8 ounces of water or  teaspoon of baking soda in 8 ounces of water. This should be done at least after each meal and at bedtime. . If you have mouth sores, avoid mouthwash that has alcohol. Avoid alcohol and smoking because they can bother your mouth and throat. . If you have numbness and tingling in your hands and feet, be careful when cooking, walking, and handling sharp objects and hot liquids. . Talk with your nurse about getting a wig before you lose your hair. Also, call the [place] at 800-ACS-2345 to find out information about the "Look Good, Feel Better" program close to where you live. It is a free program where women getting chemotherapy can learn about wigs, turbans and scarves as well as makeup techniques and skin and nail care.  Food and Drug Interactions There are no known interactions of carboplatin with food. This drug may interact with other medicines. Tell your doctor and pharmacist about all the medicines and dietary supplements (vitamins, minerals, herbs and others) that you are taking at this time. The safety and use of dietary supplements and alternative diets are often not known. Using these might affect your cancer or interfere with your treatment. Until more is known, you should not use dietary supplements or alternative diets without your doctor's help.  When to Call the Doctor Call your doctor or nurse right away if you have any of these symptoms: . Fever of 100.5 F (38 C) or above; chills . Bleeding or bruising that is not normal . Wheezing or trouble breathing . Nausea that stops you from eating or drinking . Throwing up more than once a day . Rash or itching . Loose bowel movements (diarrhea) more  than four times a day or diarrhea with weakness or feeling lightheaded . Call your doctor or nurse as soon as possible if any of these symptoms happen: . Numbness, tingling, decreased feeling or weakness in fingers, toes, arms, or legs . Change in hearing, ringing in the ears . Blurred vision or other changes in eyesight . Decreased urine . Yellowing of skin or eyes  Problems and Reproductive Concerns Sexual problems and reproduction concerns may happen. In both men and women, this drug may affect your ability to have children. This cannot be determined before your treatment. Talk with your doctor or nurse if you plan to have children. Ask for information on sperm or egg banking. In men, this drug may interfere with your ability to make sperm, but it should not change your ability to have sexual relations. In women, menstrual bleeding may become irregular or stop while you are getting this drug. Do not assume that you cannot become pregnant if you do not have a  menstrual period. Women may go through signs of menopause (change of life) like vaginal dryness or itching. Vaginal lubricants can be used to lessen vaginal dryness, itching, and pain during sexual relations. Genetic counseling is available for you to talk about the effects of this drug therapy on future pregnancies. Also, a genetic counselor can look at the possible risk of problems in the unborn baby due to this medicine if an exposure happens during pregnancy. . Pregnancy warning: This drug may have harmful effects on the unborn child, so effective methods of birth control should be used during your cancer treatment. . Breast feeding warning: It is not known if this drug passes into breast milk. For this reason, women should talk to their doctor about the risks and benefits of breast feeding during treatment with this drug because this drug may enter the breast milk and badly harm a breast feeding baby.  Paclitaxel (Taxol)  Paclitaxel is  a drug used to treat cancer. It is given in the vein (IV).  This will take 1 hour to infuse.  The first time this drug is given it will take longer to infuse because it is increased slowly to monitor for reactions.  The nurse will be in the room with you for the first 15 minutes.   Possible Side Effects . Hair loss. Hair loss is often temporary, although with certain medicine, hair loss can sometimes be permanent. Hair loss may happen suddenly or gradually. If you lose hair, you may lose it from your head, face, armpits, pubic area, chest, and/or legs. You may also notice your hair getting thin. . Swelling of your legs, ankles and/or feet (edema) . Flushing . Nausea and throwing up (vomiting) . Loose bowel movements (diarrhea) . Bone marrow depression. This is a decrease in the number of white blood cells, red blood cells, and platelets. This may raise your risk of infection, make you tired and weak (fatigue), and raise your risk of bleeding. . Effects on the nerves are called peripheral neuropathy. You may feel numbness, tingling, or pain in your hands and feet. It may be hard for you to button your clothes, open jars, or walk as usual. The effect on the nerves may get worse with more doses of the drug. These effects get better in some people after the drug is stopped but it does not get better in all people. . Changes in your liver function . Bone, joint and muscle pain . Abnormal EKG . Allergic reaction: Allergic reactions, including anaphylaxis are rare but may happen in some patients. Signs of allergic reaction to this drug may be swelling of the face, feeling like your tongue or throat are swelling, trouble breathing, rash, itching, fever, chills, feeling dizzy, and/or feeling that your heart is beating in a fast or not normal way. If this happens, do not take another dose of this drug. You should get urgent medical treatment. . Infection . Changes in your kidney function. Note: Each of the  side effects above was reported in 20% or greater of patients treated with paclitaxel. Not all possible side effects are included above.  Warnings and Precautions . Severe bone marrow depression  Side Effects . To help with hair loss, wash with a mild shampoo and avoid washing your hair every day. . Avoid rubbing your scalp, instead, pat your hair or scalp dry . Avoid coloring your hair . Limit your use of hair spray, electric curlers, blow dryers, and curling irons. . If you are interested  in getting a wig, talk to your nurse. You can also call the [place] at 800-ACS-2345 to find out information about the "Look Good, Feel Better" program close to where you live. It is a free program where women getting chemotherapy can learn about wigs, turbans and scarves as well as makeup techniques and skin and nail care. . Ask your doctor or nurse about medicines that are available to help stop or lessen diarrhea and/or nausea. . To help with nausea and vomiting, eat small, frequent meals instead of three large meals a day. Choose foods and drinks that are at room temperature. Ask your nurse or doctor about other helpful tips and medicine that is available to help or stop lessen these symptoms. . If you get diarrhea, eat low-fiber foods that are high in protein and calories and avoid foods that can irritate your digestive tracts or lead to cramping. Ask your nurse or doctor about medicine that can lessen or stop your diarrhea. . Mouth care is very important. Your mouth care should consist of routine, gentle cleaning of your teeth or dentures and rinsing your mouth with a mixture of 1/2 teaspoon of salt in 8 ounces of water or  teaspoon of baking soda in 8 ounces of water. This should be done at least after each meal and at bedtime. . If you have mouth sores, avoid mouthwash that has alcohol. Also avoid alcohol and smoking because they can bother your mouth and throat. . Drink plenty of fluids (a  minimum of eight glasses per day is recommended). . Take your temperature as your doctor or nurse tells you, and whenever you feel like you may have a fever. . Talk to your doctor or nurse about precautions you can take to avoid infections and bleeding. . Be careful when cooking, walking, and handling sharp objects and hot liquids.  Food and Drug Interactions . There are no known interactions of paclitaxel with food. . This drug may interact with other medicines. Tell your doctor and pharmacist about all the medicines and dietary supplements (vitamins, minerals, herbs and others) that you are taking at this time. . The safety and use of dietary supplements and alternative diets are often not known. Using these might affect your cancer or interfere with your treatment. Until more is known, you should not use dietary supplements or alternative diets without your cancer doctor's help.  When to Call the Doctor Call your doctor or nurse if you have any of the following symptoms and/or any new or unusual symptoms: . Fever of 100.5 F (38 C) or above . Chills . Redness, pain, warmth, or swelling at the IV site during the infusion . Signs of allergic reaction: swelling of the face, feeling like your tongue or throat are swelling, trouble breathing, rash, itching, fever, chills, feeling dizzy, and/or feeling that your heart is beating in a fast or not normal way . Feeling that your heart is beating in a fast or not normal way (palpitations) . Weight gain of 5 pounds in one week (fluid retention) . Decreased urine or very dark urine . Signs of liver problems: dark urine, pale bowel movements, bad stomach pain, feeling very tired and weak, unusual itching, or yellowing of the eyes or skin . Heavy menstrual period that lasts longer than normal . Easy bruising or bleeding . Nausea that stops you from eating or drinking, and/or that is not relieved by prescribed medicines. . Loose bowel movements (diarrhea)  more than 4 times a  day or diarrhea with weakness or lightheadedness . Pain in your mouth or throat that makes it hard to eat or drink . Lasting loss of appetite or rapid weight loss of five pounds in a week . Signs of peripheral neuropathy: numbness, tingling, or decreased feeling in fingers or toes; trouble walking or changes in the way you walk; or feeling clumsy when buttoning clothes, opening jars, or other routine activities . Joint and muscle pain that is not relieved by prescribed medicines . Extreme fatigue that interferes with normal activities . While you are getting this drug, please tell your nurse right away if you have any pain, redness, or swelling at the site of the IV infusion. . If you think you are pregnant.  Reproduction Warnings . Pregnancy warning: This drug may have harmful effects on the unborn child, it is recommended that effective methods of birth control should be used during your cancer treatment. Let your doctor know right away if you think you may be pregnant. . Breast feeding warning: Women should not breast feed during treatment because this drug could enter the breast milk and cause harm to a breast feeding baby.   SELF CARE ACTIVITIES WHILE ON CHEMOTHERAPY: Hydration Increase your fluid intake 48 hours prior to treatment and drink at least 8 to 12 cups (64 ounces) of water/decaff beverages per day after treatment. You can still have your cup of coffee or soda but these beverages do not count as part of your 8 to 12 cups that you need to drink daily. No alcohol intake.  Medications Continue taking your normal prescription medication as prescribed.  If you start any new herbal or new supplements please let us know first to make sure it is safe.  Mouth Care Have teeth cleaned professionally before starting treatment. Keep dentures and partial plates clean. Use soft toothbrush and do not use mouthwashes that contain alcohol. Biotene is a good mouthwash that is  available at most pharmacies or may be ordered by calling [phone]. Use warm salt water gargles (1 teaspoon salt per 1 quart warm water) before and after meals and at bedtime. Or you may rinse with 2 tablespoons of three-percent hydrogen peroxide mixed in eight ounces of water. If you are still having problems with your mouth or sores in your mouth please call the clinic. If you need dental work, please let the doctor know before you go for your appointment so that we can coordinate the best possible time for you in regards to your chemo regimen. You need to also let your dentist know that you are actively taking chemo. We may need to do labs prior to your dental appointment.  Skin Care Always use sunscreen that has not expired and with SPF (Sun Protection Factor) of 50 or higher. Wear hats to protect your head from the sun. Remember to use sunscreen on your hands, ears, face, & feet.  Use good moisturizing lotions such as udder cream, eucerin, or even Vaseline. Some chemotherapies can cause dry skin, color changes in your skin and nails.    . Avoid long, hot showers or baths. . Use gentle, fragrance-free soaps and laundry detergent. . Use moisturizers, preferably creams or ointments rather than lotions because the thicker consistency is better at preventing skin dehydration. Apply the cream or ointment within 15 minutes of showering. Reapply moisturizer at night, and moisturize your hands every time after you wash them.  Hair Loss (if your doctor says your hair will fall out)  .  If your doctor says that your hair is likely to fall out, decide before you begin chemo whether you want to wear a wig. You may want to shop before treatment to match your hair color. . Hats, turbans, and scarves can also camouflage hair loss, although some people prefer to leave their heads uncovered. If you go bare-headed outdoors, be sure to use sunscreen on your scalp. . Cut your hair short. It eases the  inconvenience of shedding lots of hair, but it also can reduce the emotional impact of watching your hair fall out. . Don't perm or color your hair during chemotherapy. Those chemical treatments are already damaging to hair and can enhance hair loss. Once your chemo treatments are done and your hair has grown back, it's OK to resume dyeing or perming hair. With chemotherapy, hair loss is almost always temporary. But when it grows back, it may be a different color or texture. In older adults who still had hair color before chemotherapy, the new growth may be completely gray.  Often, new hair is very fine and soft.  Infection Prevention Please wash your hands for at least 30 seconds using warm soapy water. Handwashing is the #1 way to prevent the spread of germs. Stay away from sick people or people who are getting over a cold. If you develop respiratory systems such as green/yellow mucus production or productive cough or persistent cough let us know and we will see if you need an antibiotic. It is a good idea to keep a pair of gloves on when going into grocery stores/[employer] to decrease your risk of coming into contact with germs on the carts, etc. Carry alcohol hand gel with you at all times and use it frequently if out in public. If your temperature reaches 100.5 or higher please call the clinic and let us know.  If it is after hours or on the weekend please go to the ER if your temperature is over 100.5.  Please have your own personal thermometer at home to use.    Sex and bodily fluids If you are going to have sex, a condom must be used to protect the person that isn't taking chemotherapy. Chemo can decrease your libido (sex drive). For a few days after chemotherapy, chemotherapy can be excreted through your bodily fluids.  When using the toilet please close the lid and flush the toilet twice.  Do this for a few day after you have had chemotherapy.   Effects of chemotherapy on your sex life Some  changes are simple and won't last long. They won't affect your sex life permanently. Sometimes you may feel: . too tired . not strong enough to be very active . sick or sore  . not in the mood . anxious or low Your anxiety might not seem related to sex. For example, you may be worried about the cancer and how your treatment is going. Or you may be worried about money, or about how you family are coping with your illness. These things can cause stress, which can affect your interest in sex. It's important to talk to your partner about how you feel. Remember - the changes to your sex life don't usually last long. There's usually no medical reason to stop having sex during chemo. The drugs won't have any long term physical effects on your performance or enjoyment of sex. Cancer can't be passed on to your partner during sex  Contraception It's important to use reliable contraception during treatment. Avoid  getting pregnant while you or your partner are having chemotherapy. This is because the drugs may harm the baby. Sometimes chemotherapy drugs can leave a man or woman infertile.  This means you would not be able to have children in the future. You might want to talk to someone about permanent infertility. It can be very difficult to learn that you may no longer be able to have children. Some people find counselling helpful. There might be ways to preserve your fertility, although this is easier for men than for women. You may want to speak to a fertility expert. You can talk about sperm banking or harvesting your eggs. You can also ask about other fertility options, such as donor eggs. If you have or have had breast cancer, your doctor might advise you not to take the contraceptive pill. This is because the hormones in it might affect the cancer.  It is not known for sure whether or not chemotherapy drugs can be passed on through semen or secretions from the vagina. Because of this some doctors advise  people to use a barrier method if you have sex during treatment. This applies to vaginal, anal or oral sex. Generally, doctors advise a barrier method only for the time you are actually having the treatment and for about a week after your treatment. Advice like this can be worrying, but this does not mean that you have to avoid being intimate with your partner. You can still have close contact with your partner and continue to enjoy sex. Animals If you have cats or birds we just ask that you not change the litter or change the cage.  Please have someone else do this for you while you are on chemotherapy.   Food Safety During and After Cancer Treatment Food safety is important for people both during and after cancer treatment. Cancer and cancer treatments, such as chemotherapy, radiation therapy, and stem cell/bone marrow transplantation, often weaken the immune system. This makes it harder for your body to protect itself from foodborne illness, also called food poisoning. Foodborne illness is caused by eating food that contains harmful bacteria, parasites, or viruses.  Foods to avoid Some foods have a higher risk of becoming tainted with bacteria. These include: [name] Unwashed fresh fruit and vegetables, especially leafy vegetables that can hide dirt and other contaminants . Raw sprouts, such as alfalfa sprouts . Raw or undercooked beef, especially ground beef, or other raw or undercooked meat and poultry . Fatty, fried, or spicy foods immediately before or after treatment.  These can sit heavy on your stomach and make you feel nauseous. . Raw or undercooked shellfish, such as oysters. . Sushi and sashimi, which often contain raw fish.  . Unpasteurized beverages, such as unpasteurized fruit juices, raw milk, raw yogurt, or cider . Undercooked eggs, such as soft boiled, over easy, and poached; raw, unpasteurized eggs; or foods made with raw egg, such as homemade raw cookie dough and homemade  mayonnaise Simple steps for food safety Shop smart. . Do not buy food stored or displayed in an unclean area. . Do not buy bruised or damaged fruits or vegetables. . Do not buy cans that have cracks, dents, or bulges. . Pick up foods that can spoil at the end of your shopping trip and store them in a cooler on the way home. Prepare and clean up foods carefully. . Rinse all fresh fruits and vegetables under running water, and dry them with a clean towel or paper towel. [name]  Clean the top of cans before opening them. . After preparing food, wash your hands for 20 seconds with hot water and soap. Pay special attention to areas between fingers and under nails. . Clean your utensils and dishes with hot water and soap. [name] Disinfect your kitchen and cutting boards using 1 teaspoon of liquid, unscented bleach mixed into 1 quart of water.   Dispose of old food. . Eat canned and packaged food before its expiration date (the "use by" or "best before" date). . Consume refrigerated leftovers within 3 to 4 days. After that time, throw out the food. Even if the food does not smell or look spoiled, it still may be unsafe. Some bacteria, such as Listeria, can grow even on foods stored in the refrigerator if they are kept for too long. Take precautions when eating out. . At restaurants, avoid buffets and salad bars where food sits out for a long time and comes in contact with many people. Food can become contaminated when someone with a virus, often a norovirus, or another "bug" handles it. . Put any leftover food in a "to-go" container yourself, rather than having the server do it. And, refrigerate leftovers as soon as you get home. . Choose restaurants that are clean and that are willing to prepare your food as you order it cooked.    MEDICATIONS:                                                                                                                    Zofran/Ondansetron 8mg  tablet. Take 1 tablet every 8  hours as needed for nausea/vomiting. (#1 nausea med to take, this can constipate)  Compazine/Prochlorperazine 10mg  tablet. Take 1 tablet every 6 hours as needed for nausea/vomiting. (#2 nausea med to take, this can make you sleepy) EMLA cream. Apply a quarter size amount to port site 1 hour prior to chemo. Do not rub in. Cover with plastic wrap.   Over-the-Counter Meds: Miralax 1 capful in 8 oz of fluid daily. May increase to two times a day if needed. This is a stool softener. If this doesn't work proceed you can add:  Senokot S-start with 1 tablet two times a day and increase to 4 tablets two times a day if needed. (total of 8 tablets in a 24 hour period). This is a stimulant laxative.   Call us if this does not help your bowels move.   Imodium 2mg  capsule. Take 2 capsules after the 1st loose stool and then 1 capsule every 2 hours until you go a total of 12 hours without having a loose stool. Call the [facility] if loose stools continue. If diarrhea occurs @ bedtime, take 2 capsules @ bedtime. Then take 2 capsules every 4 hours until morning. Call [facility].    Diarrhea Sheet  If you are having loose stools/diarrhea, please purchase Imodium and begin taking as outlined:  At the first sign of poorly formed or loose stools you should begin taking Imodium(loperamide) 2 mg  capsules.  Take two caplets (4mg ) followed by one caplet (2mg ) every 2 hours until you have had no diarrhea for 12 hours.  During the night take two caplets (4mg ) at bedtime and continue every 4 hours during the night until the morning.  Stop taking Imodium only after there is no sign of diarrhea for 12 hours.    Always call the [facility] if you are having loose stools/diarrhea that you can't get under control.  Loose stools/diarrhea leads to dehydration (loss of water) in your body.  We have other options of trying to get the loose stools/diarrhea to stopped but you must let us know!   Constipation Sheet *Miralax  in 8 oz of fluid daily.  May increase to two times a day if needed.  This is a stool softener.  If this not enough to keep your bowel regular:  You can add:  *Senokot S, start with one tablet twice a day and can increase to 4 tablets twice a day if needed.  This is a stimulant laxative.   Sometimes when you take pain medication you need BOTH a medicine to keep your stool soft and a medicine to help your bowel push it out!  Please call if the above does not work for you.   Do not go more than 2 days without a bowel movement.  It is very important that you do not become constipated.  It will make you feel sick to your stomach (nausea) and can cause abdominal pain and vomiting.    Nausea Sheet  Zofran/Ondansetron 8mg  tablet. Take 1 tablet every 8 hours as needed for nausea/vomiting. (#1 nausea med to take, this can constipate)  Compazine/Prochlorperazine 10mg  tablet. Take 1 tablet every 6 hours as needed for nausea/vomiting. (#2 nausea med to take, this can make you sleepy)  You can take these medications together or separately.  We would first like for you to try the Ondansetron by itself and then take the Prochloperizine if needed. But you are allowed to take both medications at the same time if your nausea is that severe.  If you are having persistent nausea (nausea that does not stop) please take these medications on a staggered schedule so that the nausea medication stays in your body.  Please call the [facility] and let us know the amount of nausea that you are experiencing.  If you begin to vomit, you need to call the [facility] and if it is the weekend and you have vomited more than one time and cant get it to stop-go to the Emergency Room.  Persistent nausea/vomiting can lead to dehydration (loss of fluid in your body) and will make you feel terrible.   Ice chips, sips of clear liquids, foods that are @ room temperature, crackers, and toast tend to be better  tolerated.    SYMPTOMS TO REPORT AS SOON AS POSSIBLE AFTER TREATMENT:  FEVER GREATER THAN 100.5 F  CHILLS WITH OR WITHOUT FEVER  NAUSEA AND VOMITING THAT IS NOT CONTROLLED WITH YOUR NAUSEA MEDICATION  UNUSUAL SHORTNESS OF BREATH  UNUSUAL BRUISING OR BLEEDING  TENDERNESS IN MOUTH AND THROAT WITH OR WITHOUT PRESENCE OF ULCERS  URINARY PROBLEMS  BOWEL PROBLEMS  UNUSUAL RASH       Wear comfortable clothing and clothing appropriate for easy access to any Portacath or PICC line. Let us know if there is anything that we can do to make your therapy better!      What to do if you need  assistance after hours or on the weekends:  CALL [phone].  HOLD on the line, do not hang up.  You will hear multiple messages but at the end you will be connected with a nurse triage line.  They will contact the doctor if necessary.  Most of the time they will be able to assist you.  Do not call the hospital operator.       I have been informed and understand all of the instructions given to me and have received a copy. I have been instructed to call the clinic [phone] or my family physician as soon as possible for continued medical care, if indicated. I do not have any more questions at this time but understand that I may call the [facility] or the Patient Navigator at [phone] during office hours should I have questions or need assistance in obtaining follow-up care.

## 2018-01-31 ENCOUNTER — Ambulatory Visit: Payer: Medicare Other | Admitting: Urology

## 2018-02-06 ENCOUNTER — Inpatient Hospital Stay (HOSPITAL_COMMUNITY): Payer: Medicare Other | Attending: Internal Medicine

## 2018-02-06 DIAGNOSIS — Z79899 Other long term (current) drug therapy: Secondary | ICD-10-CM | POA: Insufficient documentation

## 2018-02-06 DIAGNOSIS — C61 Malignant neoplasm of prostate: Secondary | ICD-10-CM | POA: Insufficient documentation

## 2018-02-06 DIAGNOSIS — C3491 Malignant neoplasm of unspecified part of right bronchus or lung: Secondary | ICD-10-CM

## 2018-02-06 DIAGNOSIS — Z7982 Long term (current) use of aspirin: Secondary | ICD-10-CM | POA: Insufficient documentation

## 2018-02-06 DIAGNOSIS — J320 Chronic maxillary sinusitis: Secondary | ICD-10-CM | POA: Insufficient documentation

## 2018-02-06 DIAGNOSIS — Z7984 Long term (current) use of oral hypoglycemic drugs: Secondary | ICD-10-CM | POA: Insufficient documentation

## 2018-02-06 DIAGNOSIS — K861 Other chronic pancreatitis: Secondary | ICD-10-CM | POA: Insufficient documentation

## 2018-02-06 DIAGNOSIS — Z923 Personal history of irradiation: Secondary | ICD-10-CM | POA: Insufficient documentation

## 2018-02-06 DIAGNOSIS — C3411 Malignant neoplasm of upper lobe, right bronchus or lung: Secondary | ICD-10-CM | POA: Insufficient documentation

## 2018-02-06 DIAGNOSIS — M25511 Pain in right shoulder: Secondary | ICD-10-CM | POA: Insufficient documentation

## 2018-02-06 DIAGNOSIS — I700 Atherosclerosis of aorta: Secondary | ICD-10-CM | POA: Insufficient documentation

## 2018-02-06 DIAGNOSIS — Z5112 Encounter for antineoplastic immunotherapy: Secondary | ICD-10-CM | POA: Insufficient documentation

## 2018-02-06 DIAGNOSIS — R634 Abnormal weight loss: Secondary | ICD-10-CM | POA: Insufficient documentation

## 2018-02-06 NOTE — Progress Notes (Signed)
Chemotherapy teaching completed.  Consent signed.  Extensive teaching packet given.   

## 2018-02-08 ENCOUNTER — Telehealth (HOSPITAL_COMMUNITY): Payer: Self-pay

## 2018-02-08 ENCOUNTER — Ambulatory Visit: Payer: Medicare Other | Admitting: General Surgery

## 2018-02-08 DIAGNOSIS — C3491 Malignant neoplasm of unspecified part of right bronchus or lung: Secondary | ICD-10-CM

## 2018-02-08 MED ORDER — OXYCODONE-ACETAMINOPHEN 5-325 MG PO TABS: 1.0000 | ORAL_TABLET | Freq: Four times a day (QID) | ORAL | 0 refills | Status: DC | PRN

## 2018-02-08 NOTE — Telephone Encounter (Signed)
Patients significant other, Fred Hernandez, called stating patient was having severe right side and right back pain. He is rating it at 20 on a pain scale of 1-10. He is not getting any relief from the hydrocodone 5/325. Explained to Fred Hernandez, I would review with the doctor and call her back. After reviewed with Dr. Delton Coombes, he changed pain medication to Percocet 5/325. (see new prescription). Called Fred Hernandez back to let her know she can pick up the prescription anytime at the front desk on 4th floor. She verbalized understanding.

## 2018-02-12 ENCOUNTER — Telehealth (HOSPITAL_COMMUNITY): Payer: Self-pay | Admitting: Emergency Medicine

## 2018-02-12 NOTE — Telephone Encounter (Signed)
Pt called and wanted his nutrition appt moved up because he is not eating good and still losing weight.  Pt appt changed to 4/16 at 10:30 am.  They are to arrive at 10:15 am to the 4th floor.  Verbalized understanding.

## 2018-02-13 ENCOUNTER — Inpatient Hospital Stay (HOSPITAL_COMMUNITY): Payer: Medicare Other | Admitting: Dietician

## 2018-02-13 ENCOUNTER — Encounter (HOSPITAL_COMMUNITY): Payer: Self-pay

## 2018-02-13 ENCOUNTER — Encounter: Payer: Self-pay | Admitting: General Surgery

## 2018-02-13 ENCOUNTER — Ambulatory Visit (INDEPENDENT_AMBULATORY_CARE_PROVIDER_SITE_OTHER): Payer: Medicare Other | Admitting: General Surgery

## 2018-02-13 VITALS — BP 130/79 | HR 116 | Temp 98.7°F | Ht 68.0 in | Wt 145.0 lb

## 2018-02-13 DIAGNOSIS — C3491 Malignant neoplasm of unspecified part of right bronchus or lung: Secondary | ICD-10-CM | POA: Diagnosis not present

## 2018-02-13 NOTE — Progress Notes (Signed)
Fred Hernandez; [id number]; 03/21/1956   HPI Patient is a 62 year old black male who was referred to my care by Dr. Delton Coombes for insertion of a Port-A-Cath.  Patient has squamous cell carcinoma of the right lung is about to undergo chemotherapy.  He denies any pain.  Currently has no fever or chills. Past Medical History:  Diagnosis Date  . CAD (coronary artery disease)     s/p stent placement unknown artery 12/2014,  . Diabetes mellitus ([place])   . GERD (gastroesophageal reflux disease)   . History of prostate cancer   . Hyperlipemia   . Hypertension   . Peripheral arterial disease ([place])    a. h/o stents. b. 03/2016  - successful PTA and covered stenting using overlapping Viabahn covered stents of a long segment in-stent restenosis of previously placed nitinol self expanding stents in the proximal mid and distal right SFA.  . Tobacco abuse     Past Surgical History:  Procedure Laterality Date  . COLONOSCOPY WITH PROPOFOL N/A 11/22/2016   Procedure: COLONOSCOPY WITH PROPOFOL;  Surgeon: [name], MD;  [facility];  Service: Endoscopy;  Laterality: N/A;  10:00 am  . CORONARY STENT PLACEMENT  12-2014   [facility] [employer]  . FEMORAL ARTERY STENT Bilateral 03/2013  . PERIPHERAL VASCULAR CATHETERIZATION N/A 04/04/2016   Procedure: Lower Extremity Angiography;  Surgeon: Lorretta Harp, MD;  Location: [facility];  Service: Cardiovascular;  Laterality: N/A;  . PERIPHERAL VASCULAR CATHETERIZATION N/A 04/04/2016   Procedure: Abdominal Aortogram;  Surgeon: Lorretta Harp, MD;  Location: [facility] CV LAB;  Service: Cardiovascular;  Laterality: N/A;  . PERIPHERAL VASCULAR CATHETERIZATION  04/04/2016   Procedure: Peripheral Vascular Intervention;  Surgeon: Lorretta Harp, MD;  Location: [facility] LAB;  Service: Cardiovascular;;  rt SFA stent  . POLYPECTOMY  11/22/2016   Procedure: POLYPECTOMY;  Surgeon: [name], MD;  Location: [facility];  Service:  Endoscopy;;  descending colon polyp, transverse colon polyp, sigmoid colon polyp, rectal polyp  . STENT PLACEMENT VASCULAR ([facility])  2013    Family History  Problem Relation Age of Onset  . Diabetes Mother   . Heart disease Mother   . Hypertension Mother   . Healthy Brother   . Other Brother        accident   . Arthritis Sister   . Arthritis Sister   . SIDS Brother   . Colon cancer Neg Hx     Current Outpatient Medications on File Prior to Visit  Medication Sig Dispense Refill  . aspirin 81 MG chewable tablet Chew 81 mg by mouth daily.    [name] atorvastatin (LIPITOR) 80 MG tablet Take 80 mg by mouth at bedtime.     [name] CARBOPLATIN IV Inject into the vein. 6 weekly treatments with radiation    . cilostazol (PLETAL) 50 MG tablet TAKE 1 TABLET(50 MG) BY MOUTH TWICE DAILY 60 tablet 6  . Coenzyme Q10 100 MG capsule Take 100 mg by mouth at bedtime.     [name] FLOVENT HFA 110 MCG/ACT inhaler Inhale 1 puff into the lungs 2 (two) times daily.  2  . fluticasone (FLONASE) 50 MCG/ACT nasal spray Place 1 spray into both nostrils daily.    [name] ipratropium-albuterol (DUONEB) 0.5-2.5 (3) MG/3ML SOLN Take 3 mLs by nebulization every 6 (six) hours as needed (for shortness of breath).    . lidocaine-prilocaine (EMLA) cream Apply a quarter size amount to affected area 1 hour prior  to coming to chemotherapy.  Do not rub in.  Cover with plastic wrap. 30 g 2  . metFORMIN (GLUCOPHAGE) 500 MG tablet Take 500 mg by mouth 2 (two) times daily.     . metoprolol succinate (TOPROL-XL) 50 MG 24 hr tablet Take 50 mg by mouth at bedtime.     . Omega-3 Fatty Acids (FISH OIL) 1000 MG CAPS Take 1 capsule by mouth 2 (two) times daily.     . ondansetron (ZOFRAN) 8 MG tablet Take 1 tablet (8 mg total) by mouth every 8 (eight) hours as needed for nausea or vomiting. 30 tablet 2  . oxyCODONE-acetaminophen (PERCOCET/ROXICET) 5-325 MG tablet Take 1 tablet by mouth every 6 (six) hours as needed for severe pain. 56 tablet 0  . PACLitaxel  (TAXOL IV) Inject into the vein. 6 weekly treatments with radiation    . pantoprazole (PROTONIX) 40 MG tablet Take 40 mg by mouth 2 (two) times daily.    . prochlorperazine (COMPAZINE) 10 MG tablet Take 1 tablet (10 mg total) by mouth every 6 (six) hours as needed for nausea or vomiting. 30 tablet 2   No current facility-administered medications on file prior to visit.     Allergies  Allergen Reactions  . Lisinopril Anaphylaxis  . Penicillins Anaphylaxis    Has patient had a PCN reaction causing immediate rash, facial/tongue/throat swelling, SOB or lightheadedness with hypotension:Yes Has patient had a PCN reaction causing severe rash involving mucus membranes or skin necrosis:unsure Has patient had a PCN reaction that required hospitalization:unsure Has patient had a PCN reaction occurring within the last 10 years:~10 years per patient If all of the above answers are "NO", then may proceed with Cephalosporin use.   . Ace Inhibitors Swelling    Social History   Substance and Sexual Activity  Alcohol Use Yes  . Alcohol/week: 0.0 oz   Comment: Pt states he drinks a pint of liquor 2-3 times a week    Social History   Tobacco Use  Smoking Status Current Every Day Smoker  . Packs/day: 0.50  . Years: 45.00  . Pack years: 22.50  . Types: Cigarettes  . Start date: 02/12/1971  Smokeless Tobacco Never Used    Review of Systems  Constitutional: Positive for malaise/fatigue.  HENT: Positive for sinus pain.   Eyes: Negative.   Respiratory: Positive for cough, shortness of breath and wheezing.   Cardiovascular: Negative.   Gastrointestinal: Positive for heartburn.  Genitourinary: Negative.   Musculoskeletal: Positive for back pain.  Skin: Negative.   Neurological: Negative.   Endo/Heme/Allergies: Negative.   Psychiatric/Behavioral: Negative.     Objective   Vitals:   02/13/18 1331  BP: 130/79  Pulse: (!) 116  Temp: 98.7 F (37.1 C)    Physical Exam  Constitutional:  He is oriented to person, place, and time. He appears well-developed and well-nourished.  Cardiovascular: Normal rate, regular rhythm and normal heart sounds. Exam reveals no gallop and no friction rub.  No murmur heard. Pulmonary/Chest: Effort normal and breath sounds normal. No respiratory distress. He has no wheezes. He has no rales.  Neurological: He is alert and oriented to person, place, and time.  Skin: Skin is warm and dry.  Vitals reviewed. Oncology notes reviewed.  Assessment    Right lung carcinoma, need for central venous access Plan    Patient is scheduled for Port-A-Cath insertion on 02/19/2018.  The risks and benefits of the procedure including bleeding, infection, and pneumothorax were fully explained to the patient, who gave  informed consent.  He was instructed to stop his baby aspirin and Pletal.

## 2018-02-13 NOTE — Patient Instructions (Signed)
Implanted Port Insertion  Implanted port insertion is a procedure to put in a port and catheter. The port is a device with an injectable disk that can be accessed by your health care provider. The port is connected to a vein in the chest or neck by a small flexible tube (catheter). There are different types of ports. The implanted port may be used as a long-term IV access for:  · Medicines, such as chemotherapy.  · Fluids.  · Liquid nutrition, such as total parenteral nutrition (TPN).  · Blood samples.    Having a port means that your health care provider will not need to use the veins in your arms for these procedures.  Tell a health care provider about:  · Any allergies you have.  · All medicines you are taking, especially blood thinners, as well as any vitamins, herbs, eye drops, creams, over-the-counter medicines, and steroids.  · Any problems you or family members have had with anesthetic medicines.  · Any blood disorders you have.  · Any surgeries you have had.  · Any medical conditions you have, including diabetes or kidney problems.  · Whether you are pregnant or may be pregnant.  What are the risks?  Generally, this is a safe procedure. However, problems may occur, including:  · Allergic reactions to medicines or dyes.  · Damage to other structures or organs.  · Infection.  · Damage to the blood vessel, bruising, or bleeding at the puncture site.  · Blood clot.  · Breakdown of the skin over the port.  · A collection of air in the chest that can cause one of the lungs to collapse (pneumothorax). This is rare.    What happens before the procedure?  Staying hydrated  Follow instructions from your health care provider about hydration, which may include:  · Up to 2 hours before the procedure - you may continue to drink clear liquids, such as water, clear fruit juice, black coffee, and plain tea.    Eating and drinking restrictions  · Follow instructions from your health care provider about eating and drinking,  which may include:  ? 8 hours before the procedure - stop eating heavy meals or foods such as meat, fried foods, or fatty foods.  ? 6 hours before the procedure - stop eating light meals or foods, such as toast or cereal.  ? 6 hours before the procedure - stop drinking milk or drinks that contain milk.  ? 2 hours before the procedure - stop drinking clear liquids.  Medicines  · Ask your health care provider about:  ? Changing or stopping your regular medicines. This is especially important if you are taking diabetes medicines or blood thinners.  ? Taking medicines such as aspirin and ibuprofen. These medicines can thin your blood. Do not take these medicines before your procedure if your health care provider instructs you not to.  · You may be given antibiotic medicine to help prevent infection.  General instructions  · Plan to have someone take you home from the hospital or clinic.  · If you will be going home right after the procedure, plan to have someone with you for 24 hours.  · You may have blood tests.  · You may be asked to shower with a germ-killing soap.  What happens during the procedure?  · To lower your risk of infection:  ? Your health care team will wash or sanitize their hands.  ? Your skin will be washed with   soap.  ? Hair may be removed from the surgical area.  · An IV tube will be inserted into one of your veins.  · You will be given one or more of the following:  ? A medicine to help you relax (sedative).  ? A medicine to numb the area (local anesthetic).  · Two small cuts (incisions) will be made to insert the port.  ? One incision will be made in your neck to get access to the vein where the catheter will lie.  ? The other incision will be made in the upper chest. This is where the port will lie.  · The procedure may be done using continuous X-ray (fluoroscopy) or other imaging tools for guidance.  · The port and catheter will be placed. There may be a small, raised area where the port  is.  · The port will be flushed with a salt solution (saline), and blood will be drawn to make sure that it is working correctly.  · The incisions will be closed.  · Bandages (dressings) may be placed over the incisions.  The procedure may vary among health care providers and hospitals.  What happens after the procedure?  · Your blood pressure, heart rate, breathing rate, and blood oxygen level will be monitored until the medicines you were given have worn off.  · Do not drive for 24 hours if you were given a sedative.  · You will be given a manufacturer's information card for the type of port that you have. Keep this with you.  · Your port will need to be flushed and checked as told by your health care provider, usually every few weeks.  · A chest X-ray will be done to:  ? Check the placement of the port.  ? Make sure there is no injury to your lung.  Summary  · Implanted port insertion is a procedure to put in a port and catheter.  · The implanted port is used as a long-term IV access.  · The port will need to be flushed and checked as told by your health care provider, usually every few weeks.  · Keep your manufacturer's information card with you at all times.  This information is not intended to replace advice given to you by your health care provider. Make sure you discuss any questions you have with your health care provider.  Document Released: 08/07/2013 Document Revised: 09/07/2016 Document Reviewed: 09/07/2016  [employer] Interactive Patient Education © 2017 [employer]

## 2018-02-13 NOTE — Patient Instructions (Signed)
Fred Hernandez  02/13/2018     @PREFPERIOPPHARMACY @   Your procedure is scheduled on  02/19/2018 .  Report to [facility] at  1115   A.M.  Call this number if you have problems the morning of surgery:  [phone]   Remember:  Do not eat food or drink liquids after midnight.  Take these medicines the morning of surgery with A SIP OF WATER  Metoprolol, zofran or compazine, oxycodone, protonix. Use your inhaler and your nebulizer before you come.   Do not wear jewelry, make-up or nail polish.  Do not wear lotions, powders, or perfumes, or deodorant.  Do not shave 48 hours prior to surgery.  Men may shave face and neck.  Do not bring valuables to the hospital.  [facility] is not responsible for any belongings or valuables.  Contacts, dentures or bridgework may not be worn into surgery.  Leave your suitcase in the car.  After surgery it may be brought to your room.  For patients admitted to the hospital, discharge time will be determined by your treatment team.  Patients discharged the day of surgery will not be allowed to drive home.   Name and phone number of your driver:   family Special instructions:  None  Please read over the following fact sheets that you were given. Anesthesia Post-op Instructions and Care and Recovery After Surgery       Implanted Port Insertion Implanted port insertion is a procedure to put in a port and catheter. The port is a device with an injectable disk that can be accessed by your health care provider. The port is connected to a vein in the chest or neck by a small flexible tube (catheter). There are different types of ports. The implanted port may be used as a long-term IV access for:  Medicines, such as chemotherapy.  Fluids.  Liquid nutrition, such as total parenteral nutrition (TPN).  Blood samples.  Having a port means that your health care provider will not need to use the veins in your arms for these  procedures. Tell a health care provider about:  Any allergies you have.  All medicines you are taking, especially blood thinners, as well as any vitamins, herbs, eye drops, creams, over-the-counter medicines, and steroids.  Any problems you or family members have had with anesthetic medicines.  Any blood disorders you have.  Any surgeries you have had.  Any medical conditions you have, including diabetes or kidney problems.  Whether you are pregnant or may be pregnant. What are the risks? Generally, this is a safe procedure. However, problems may occur, including:  Allergic reactions to medicines or dyes.  Damage to other structures or organs.  Infection.  Damage to the blood vessel, bruising, or bleeding at the puncture site.  Blood clot.  Breakdown of the skin over the port.  A collection of air in the chest that can cause one of the lungs to collapse (pneumothorax). This is rare.  What happens before the procedure? Staying hydrated Follow instructions from your health care provider about hydration, which may include:  Up to 2 hours before the procedure - you may continue to drink clear liquids, such as water, clear fruit juice, black coffee, and plain tea.  Eating and drinking restrictions  Follow instructions from your health care provider about eating and drinking, which may include: ? 8 hours before the procedure - stop eating heavy meals or  foods such as meat, fried foods, or fatty foods. ? 6 hours before the procedure - stop eating light meals or foods, such as toast or cereal. ? 6 hours before the procedure - stop drinking milk or drinks that contain milk. ? 2 hours before the procedure - stop drinking clear liquids. Medicines  Ask your health care provider about: ? Changing or stopping your regular medicines. This is especially important if you are taking diabetes medicines or blood thinners. ? Taking medicines such as aspirin and ibuprofen. These medicines  can thin your blood. Do not take these medicines before your procedure if your health care provider instructs you not to.  You may be given antibiotic medicine to help prevent infection. General instructions  Plan to have someone take you home from the hospital or clinic.  If you will be going home right after the procedure, plan to have someone with you for 24 hours.  You may have blood tests.  You may be asked to shower with a germ-killing soap. What happens during the procedure?  To lower your risk of infection: ? Your health care team will wash or sanitize their hands. ? Your skin will be washed with soap. ? Hair may be removed from the surgical area.  An IV tube will be inserted into one of your veins.  You will be given one or more of the following: ? A medicine to help you relax (sedative). ? A medicine to numb the area (local anesthetic).  Two small cuts (incisions) will be made to insert the port. ? One incision will be made in your neck to get access to the vein where the catheter will lie. ? The other incision will be made in the upper chest. This is where the port will lie.  The procedure may be done using continuous X-ray (fluoroscopy) or other imaging tools for guidance.  The port and catheter will be placed. There may be a small, raised area where the port is.  The port will be flushed with a salt solution (saline), and blood will be drawn to make sure that it is working correctly.  The incisions will be closed.  Bandages (dressings) may be placed over the incisions. The procedure may vary among health care providers and hospitals. What happens after the procedure?  Your blood pressure, heart rate, breathing rate, and blood oxygen level will be monitored until the medicines you were given have worn off.  Do not drive for 24 hours if you were given a sedative.  You will be given a manufacturer's information card for the type of port that you have. Keep this  with you.  Your port will need to be flushed and checked as told by your health care provider, usually every few weeks.  A chest X-ray will be done to: ? Check the placement of the port. ? Make sure there is no injury to your lung. Summary  Implanted port insertion is a procedure to put in a port and catheter.  The implanted port is used as a long-term IV access.  The port will need to be flushed and checked as told by your health care provider, usually every few weeks.  Keep your manufacturer's information card with you at all times. This information is not intended to replace advice given to you by your health care provider. Make sure you discuss any questions you have with your health care provider. Document Released: 08/07/2013 Document Revised: 09/07/2016 Document Reviewed: 09/07/2016 Elsevier Interactive Patient Education  2017 [place] Insertion, Care After This sheet gives you information about how to care for yourself after your procedure. Your health care provider may also give you more specific instructions. If you have problems or questions, contact your health care provider. What can I expect after the procedure? After your procedure, it is common to have:  Discomfort at the port insertion site.  Bruising on the skin over the port. This should improve over 3-4 days.  Follow these instructions at home: [facility] care  After your port is placed, you will get a manufacturer's information card. The card has information about your port. Keep this card with you at all times.  Take care of the port as told by your health care provider. Ask your health care provider if you or a family member can get training for taking care of the port at home. A home health care nurse may also take care of the port.  Make sure to remember what type of port you have. Incision care  Follow instructions from your health care provider about how to take care of your port  insertion site. Make sure you: ? Wash your hands with soap and water before you change your bandage (dressing). If soap and water are not available, use hand sanitizer. ? Change your dressing as told by your health care provider. ? Leave stitches (sutures), skin glue, or adhesive strips in place. These skin closures may need to stay in place for 2 weeks or longer. If adhesive strip edges start to loosen and curl up, you may trim the loose edges. Do not remove adhesive strips completely unless your health care provider tells you to do that.  Check your port insertion site every day for signs of infection. Check for: ? More redness, swelling, or pain. ? More fluid or blood. ? Warmth. ? Pus or a bad smell. General instructions  Do not take baths, swim, or use a hot tub until your health care provider approves.  Do not lift anything that is heavier than 10 lb (4.5 kg) for a week, or as told by your health care provider.  Ask your health care provider when it is okay to: ? Return to work or school. ? Resume usual physical activities or sports.  Do not drive for 24 hours if you were given a medicine to help you relax (sedative).  Take over-the-counter and prescription medicines only as told by your health care provider.  Wear a medical alert bracelet in case of an emergency. This will tell any health care providers that you have a port.  Keep all follow-up visits as told by your health care provider. This is important. Contact a health care provider if:  You cannot flush your port with saline as directed, or you cannot draw blood from the port.  You have a fever or chills.  You have more redness, swelling, or pain around your port insertion site.  You have more fluid or blood coming from your port insertion site.  Your port insertion site feels warm to the touch.  You have pus or a bad smell coming from the port insertion site. Get help right away if:  You have chest pain or  shortness of breath.  You have bleeding from your port that you cannot control. Summary  Take care of the port as told by your health care provider.  Change your dressing as told by your health care provider.  Keep all follow-up visits as told  by your health care provider. This information is not intended to replace advice given to you by your health care provider. Make sure you discuss any questions you have with your health care provider. Document Released: 08/07/2013 Document Revised: 09/07/2016 Document Reviewed: 09/07/2016 Elsevier Interactive Patient Education  2017 [place] An implanted port is a type of central line that is placed under the skin. Central lines are used to provide IV access when treatment or nutrition needs to be given through a person's veins. Implanted ports are used for long-term IV access. An implanted port may be placed because:  You need IV medicine that would be irritating to the small veins in your hands or arms.  You need long-term IV medicines, such as antibiotics.  You need IV nutrition for a long period.  You need frequent blood draws for lab tests.  You need dialysis.  Implanted ports are usually placed in the chest area, but they can also be placed in the upper arm, the abdomen, or the leg. An implanted port has two main parts:  Reservoir. The reservoir is round and will appear as a small, raised area under your skin. The reservoir is the part where a needle is inserted to give medicines or draw blood.  Catheter. The catheter is a thin, flexible tube that extends from the reservoir. The catheter is placed into a large vein. Medicine that is inserted into the reservoir goes into the catheter and then into the vein.  How will I care for my incision site? Do not get the incision site wet. Bathe or shower as directed by your health care provider. How is my port accessed? Special steps must be taken to access the  port:  Before the port is accessed, a numbing cream can be placed on the skin. This helps numb the skin over the port site.  Your health care provider uses a sterile technique to access the port. ? Your health care provider must put on a mask and sterile gloves. ? The skin over your port is cleaned carefully with an antiseptic and allowed to dry. ? The port is gently pinched between sterile gloves, and a needle is inserted into the port.  Only "non-coring" port needles should be used to access the port. Once the port is accessed, a blood return should be checked. This helps ensure that the port is in the vein and is not clogged.  If your port needs to remain accessed for a constant infusion, a clear (transparent) bandage will be placed over the needle site. The bandage and needle will need to be changed every week, or as directed by your health care provider.  Keep the bandage covering the needle clean and dry. Do not get it wet. Follow your health care provider's instructions on how to take a shower or bath while the port is accessed.  If your port does not need to stay accessed, no bandage is needed over the port.  What is flushing? Flushing helps keep the port from getting clogged. Follow your health care provider's instructions on how and when to flush the port. Ports are usually flushed with saline solution or a medicine called heparin. The need for flushing will depend on how the port is used.  If the port is used for intermittent medicines or blood draws, the port will need to be flushed: ? After medicines have been given. ? After blood has been drawn. ? As part of routine maintenance.  If a constant infusion is running, the port may not need to be flushed.  How long will my port stay implanted? The port can stay in for as long as your health care provider thinks it is needed. When it is time for the port to come out, surgery will be done to remove it. The procedure is similar to  the one performed when the port was put in. When should I seek immediate medical care? When you have an implanted port, you should seek immediate medical care if:  You notice a bad smell coming from the incision site.  You have swelling, redness, or drainage at the incision site.  You have more swelling or pain at the port site or the surrounding area.  You have a fever that is not controlled with medicine.  This information is not intended to replace advice given to you by your health care provider. Make sure you discuss any questions you have with your health care provider. Document Released: 10/17/2005 Document Revised: 03/24/2016 Document Reviewed: 06/24/2013 Elsevier Interactive Patient Education  2017 [place] Anesthesia is a term that refers to techniques, procedures, and medicines that help a person stay safe and comfortable during a medical procedure. Monitored anesthesia care, or sedation, is one type of anesthesia. Your anesthesia specialist may recommend sedation if you will be having a procedure that does not require you to be unconscious, such as:  Cataract surgery.  A dental procedure.  A biopsy.  A colonoscopy.  During the procedure, you may receive a medicine to help you relax (sedative). There are three levels of sedation:  Mild sedation. At this level, you may feel awake and relaxed. You will be able to follow directions.  Moderate sedation. At this level, you will be sleepy. You may not remember the procedure.  Deep sedation. At this level, you will be asleep. You will not remember the procedure.  The more medicine you are given, the deeper your level of sedation will be. Depending on how you respond to the procedure, the anesthesia specialist may change your level of sedation or the type of anesthesia to fit your needs. An anesthesia specialist will monitor you closely during the procedure. Let your health care provider know  about:  Any allergies you have.  All medicines you are taking, including vitamins, herbs, eye drops, creams, and over-the-counter medicines.  Any use of steroids (by mouth or as a cream).  Any problems you or family members have had with sedatives and anesthetic medicines.  Any blood disorders you have.  Any surgeries you have had.  Any medical conditions you have, such as sleep apnea.  Whether you are pregnant or may be pregnant.  Any use of cigarettes, alcohol, or street drugs. What are the risks? Generally, this is a safe procedure. However, problems may occur, including:  Getting too much medicine (oversedation).  Nausea.  Allergic reaction to medicines.  Trouble breathing. If this happens, a breathing tube may be used to help with breathing. It will be removed when you are awake and breathing on your own.  Heart trouble.  Lung trouble.  Before the procedure Staying hydrated Follow instructions from your health care provider about hydration, which may include:  Up to 2 hours before the procedure - you may continue to drink clear liquids, such as water, clear fruit juice, black coffee, and plain tea.  Eating and drinking restrictions Follow instructions from your health care provider about eating and drinking, which may  include:  8 hours before the procedure - stop eating heavy meals or foods such as meat, fried foods, or fatty foods.  6 hours before the procedure - stop eating light meals or foods, such as toast or cereal.  6 hours before the procedure - stop drinking milk or drinks that contain milk.  2 hours before the procedure - stop drinking clear liquids.  Medicines Ask your health care provider about:  Changing or stopping your regular medicines. This is especially important if you are taking diabetes medicines or blood thinners.  Taking medicines such as aspirin and ibuprofen. These medicines can thin your blood. Do not take these medicines before  your procedure if your health care provider instructs you not to.  Tests and exams  You will have a physical exam.  You may have blood tests done to show: ? How well your kidneys and liver are working. ? How well your blood can clot.  General instructions  Plan to have someone take you home from the hospital or clinic.  If you will be going home right after the procedure, plan to have someone with you for 24 hours.  What happens during the procedure?  Your blood pressure, heart rate, breathing, level of pain and overall condition will be monitored.  An IV tube will be inserted into one of your veins.  Your anesthesia specialist will give you medicines as needed to keep you comfortable during the procedure. This may mean changing the level of sedation.  The procedure will be performed. After the procedure  Your blood pressure, heart rate, breathing rate, and blood oxygen level will be monitored until the medicines you were given have worn off.  Do not drive for 24 hours if you received a sedative.  You may: ? Feel sleepy, clumsy, or nauseous. ? Feel forgetful about what happened after the procedure. ? Have a sore throat if you had a breathing tube during the procedure. ? Vomit. This information is not intended to replace advice given to you by your health care provider. Make sure you discuss any questions you have with your health care provider. Document Released: 07/13/2005 Document Revised: 03/25/2016 Document Reviewed: 02/07/2016 Elsevier Interactive Patient Education  2018 [place], Care After These instructions provide you with information about caring for yourself after your procedure. Your health care provider may also give you more specific instructions. Your treatment has been planned according to current medical practices, but problems sometimes occur. Call your health care provider if you have any problems or questions after your  procedure. What can I expect after the procedure? After your procedure, it is common to:  Feel sleepy for several hours.  Feel clumsy and have poor balance for several hours.  Feel forgetful about what happened after the procedure.  Have poor judgment for several hours.  Feel nauseous or vomit.  Have a sore throat if you had a breathing tube during the procedure.  Follow these instructions at home: For at least 24 hours after the procedure:   Do not: ? Participate in activities in which you could fall or become injured. ? Drive. ? Use heavy machinery. ? Drink alcohol. ? Take sleeping pills or medicines that cause drowsiness. ? Make important decisions or sign legal documents. ? Take care of children on your own.  Rest. Eating and drinking  Follow the diet that is recommended by your health care provider.  If you vomit, drink water, juice, or soup when you can drink  without vomiting.  Make sure you have little or no nausea before eating solid foods. General instructions  Have a responsible adult stay with you until you are awake and alert.  Take over-the-counter and prescription medicines only as told by your health care provider.  If you smoke, do not smoke without supervision.  Keep all follow-up visits as told by your health care provider. This is important. Contact a health care provider if:  You keep feeling nauseous or you keep vomiting.  You feel light-headed.  You develop a rash.  You have a fever. Get help right away if:  You have trouble breathing. This information is not intended to replace advice given to you by your health care provider. Make sure you discuss any questions you have with your health care provider. Document Released: 02/07/2016 Document Revised: 06/08/2016 Document Reviewed: 02/07/2016 Elsevier Interactive Patient Education  [employer].

## 2018-02-13 NOTE — H&P (Signed)
Fred Hernandez; [id number]; 10-Jul-1956   HPI Patient is a 62 year old black male who was referred to my care by Dr. Delton Coombes for insertion of a Port-A-Cath.  Patient has squamous cell carcinoma of the right lung is about to undergo chemotherapy.  He denies any pain.  Currently has no fever or chills. Past Medical History:  Diagnosis Date  . CAD (coronary artery disease)     s/p stent placement unknown artery 12/2014,  . Diabetes mellitus ([place])   . GERD (gastroesophageal reflux disease)   . History of prostate cancer   . Hyperlipemia   . Hypertension   . Peripheral arterial disease ([place])    a. h/o stents. b. 03/2016  - successful PTA and covered stenting using overlapping Viabahn covered stents of a long segment in-stent restenosis of previously placed nitinol self expanding stents in the proximal mid and distal right SFA.  . Tobacco abuse     Past Surgical History:  Procedure Laterality Date  . COLONOSCOPY WITH PROPOFOL N/A 11/22/2016   Procedure: COLONOSCOPY WITH PROPOFOL;  Surgeon: [name], MD;  [facility];  Service: Endoscopy;  Laterality: N/A;  10:00 am  . CORONARY STENT PLACEMENT  12-2014   [facility] [employer]  . FEMORAL ARTERY STENT Bilateral 03/2013  . PERIPHERAL VASCULAR CATHETERIZATION N/A 04/04/2016   Procedure: Lower Extremity Angiography;  Surgeon: Lorretta Harp, MD;  Location: [place] [facility];  Service: Cardiovascular;  Laterality: N/A;  . PERIPHERAL VASCULAR CATHETERIZATION N/A 04/04/2016   Procedure: Abdominal Aortogram;  Surgeon: Lorretta Harp, MD;  Location: [place] [facility];  Service: Cardiovascular;  Laterality: N/A;  . PERIPHERAL VASCULAR CATHETERIZATION  04/04/2016   Procedure: Peripheral Vascular Intervention;  Surgeon: Lorretta Harp, MD;  Location: [place] [facility];  Service: Cardiovascular;;  rt SFA stent  . POLYPECTOMY  11/22/2016   Procedure: POLYPECTOMY;  Surgeon: [name], MD;  [facility];  Service:  Endoscopy;;  descending colon polyp, transverse colon polyp, sigmoid colon polyp, rectal polyp  . STENT PLACEMENT VASCULAR ([place] [facility])  2013    Family History  Problem Relation Age of Onset  . Diabetes Mother   . Heart disease Mother   . Hypertension Mother   . Healthy Brother   . Other Brother        accident   . Arthritis Sister   . Arthritis Sister   . SIDS Brother   . Colon cancer Neg Hx     Current Outpatient Medications on File Prior to Visit  Medication Sig Dispense Refill  . aspirin 81 MG chewable tablet Chew 81 mg by mouth daily.    [name] atorvastatin (LIPITOR) 80 MG tablet Take 80 mg by mouth at bedtime.     [name] CARBOPLATIN IV Inject into the vein. 6 weekly treatments with radiation    . cilostazol (PLETAL) 50 MG tablet TAKE 1 TABLET(50 MG) BY MOUTH TWICE DAILY 60 tablet 6  . Coenzyme Q10 100 MG capsule Take 100 mg by mouth at bedtime.     [name] FLOVENT HFA 110 MCG/ACT inhaler Inhale 1 puff into the lungs 2 (two) times daily.  2  . fluticasone (FLONASE) 50 MCG/ACT nasal spray Place 1 spray into both nostrils daily.    [name] ipratropium-albuterol (DUONEB) 0.5-2.5 (3) MG/3ML SOLN Take 3 mLs by nebulization every 6 (six) hours as needed (for shortness of breath).    . lidocaine-prilocaine (EMLA) cream Apply a quarter size amount to affected area 1 hour prior  to coming to chemotherapy.  Do not rub in.  Cover with plastic wrap. 30 g 2  . metFORMIN (GLUCOPHAGE) 500 MG tablet Take 500 mg by mouth 2 (two) times daily.     . metoprolol succinate (TOPROL-XL) 50 MG 24 hr tablet Take 50 mg by mouth at bedtime.     . Omega-3 Fatty Acids (FISH OIL) 1000 MG CAPS Take 1 capsule by mouth 2 (two) times daily.     . ondansetron (ZOFRAN) 8 MG tablet Take 1 tablet (8 mg total) by mouth every 8 (eight) hours as needed for nausea or vomiting. 30 tablet 2  . oxyCODONE-acetaminophen (PERCOCET/ROXICET) 5-325 MG tablet Take 1 tablet by mouth every 6 (six) hours as needed for severe pain. 56 tablet 0  . PACLitaxel  (TAXOL IV) Inject into the vein. 6 weekly treatments with radiation    . pantoprazole (PROTONIX) 40 MG tablet Take 40 mg by mouth 2 (two) times daily.    . prochlorperazine (COMPAZINE) 10 MG tablet Take 1 tablet (10 mg total) by mouth every 6 (six) hours as needed for nausea or vomiting. 30 tablet 2   No current facility-administered medications on file prior to visit.     Allergies  Allergen Reactions  . Lisinopril Anaphylaxis  . Penicillins Anaphylaxis    Has patient had a PCN reaction causing immediate rash, facial/tongue/throat swelling, SOB or lightheadedness with hypotension:Yes Has patient had a PCN reaction causing severe rash involving mucus membranes or skin necrosis:unsure Has patient had a PCN reaction that required hospitalization:unsure Has patient had a PCN reaction occurring within the last 10 years:~10 years per patient If all of the above answers are "NO", then may proceed with Cephalosporin use.   . Ace Inhibitors Swelling    Social History   Substance and Sexual Activity  Alcohol Use Yes  . Alcohol/week: 0.0 oz   Comment: Pt states he drinks a pint of liquor 2-3 times a week    Social History   Tobacco Use  Smoking Status Current Every Day Smoker  . Packs/day: 0.50  . Years: 45.00  . Pack years: 22.50  . Types: Cigarettes  . Start date: 02/12/1971  Smokeless Tobacco Never Used    Review of Systems  Constitutional: Positive for malaise/fatigue.  HENT: Positive for sinus pain.   Eyes: Negative.   Respiratory: Positive for cough, shortness of breath and wheezing.   Cardiovascular: Negative.   Gastrointestinal: Positive for heartburn.  Genitourinary: Negative.   Musculoskeletal: Positive for back pain.  Skin: Negative.   Neurological: Negative.   Endo/Heme/Allergies: Negative.   Psychiatric/Behavioral: Negative.     Objective   Vitals:   02/13/18 1331  BP: 130/79  Pulse: (!) 116  Temp: 98.7 F (37.1 C)    Physical Exam  Constitutional:  He is oriented to person, place, and time. He appears well-developed and well-nourished.  Cardiovascular: Normal rate, regular rhythm and normal heart sounds. Exam reveals no gallop and no friction rub.  No murmur heard. Pulmonary/Chest: Effort normal and breath sounds normal. No respiratory distress. He has no wheezes. He has no rales.  Neurological: He is alert and oriented to person, place, and time.  Skin: Skin is warm and dry.  Vitals reviewed. Oncology notes reviewed.  Assessment    Right lung carcinoma, need for central venous access Plan    Patient is scheduled for Port-A-Cath insertion on 02/19/2018.  The risks and benefits of the procedure including bleeding, infection, and pneumothorax were fully explained to the patient, who gave  informed consent.  He was instructed to stop his baby aspirin and Pletal.

## 2018-02-13 NOTE — Progress Notes (Signed)
Nutrition Assessment  Reason for Assessment: Consult/referral for wt loss/poor appetite  ASSESSMENT:  62 y/o male PMHx tobacco abuse, COPD, PAD, DM2, HLD/HTN, CAD, GERD, Prostate Cancer. CT performed 1/21 d/t  chest pain revealed likely lung cancer, w/ subsequent biopsy confirmation. Referred to RD due to poor appetite and weight loss prior to undergoing chemoradiation.   Patient seen with 3 other individuals today.  Patient reports drastic decline in appetite and weight. Family/pt report UBW of 167 lbs. Per chart information. Pt was 162 lbs this past January when he was initially diagnosed with mass. He is now 147.3 lbs, indicating loss of 15 lbs or 9% bw in 3 months.   Went through diet recall Breakfast/Lunch: SKIPS, though may drinks glucerna Dinner: "regular food". Meat, veg, starch. Gives example of chicken and rice.   Family notes he has been eating items such as hotdogs and sandwiches, albeit, infrequently. He will only take "bites" of meals.   Has had one episode of nausea and vomiting, none since. Had constipation, but this is now controlled with a softener.  He is a diabetic, but did not follow a diabetic diet. Reports BGs 97-110 and was managed with 2 metformin pills/day.   Patient comments multiple times that he eats differently since he "stopped drinking". Initially thought this was simply water, but later it he admits he had been drinking habitually, up until a few weeks ago. RD asked if this was more than a few drinks a day; family chuckes and also comment "yes" in unison. Sounds to have hx of etoh abuse  NUTRITION - FOCUSED PHYSICAL EXAM: Unable to assess. In jeans, long sleave shirt, coat and hat due to being cold  MEDICATIONS:  Chemo: Carboplatin, taxol.  Supportive meds: Compazine, Omega 3s, perocet.  Other: metformin  LABS:  None in >1 month  ANTHROPOMETRICS: Height:  5' 8"   (172.72 cm) Weight: 147 lbs 6 oz (67 kg) BMI: 22.42-healthy wt for ht UBW: 167  lbs  IBW: 154 lbs (70 kg)   ESTIMATED ENERGY NEEDS:  Kcal: 2100-2300 (31-34 kcal/kg bw  Protein: 87-100g Pro (1.3-1.5 g/kg bw) Fluid: >2.1 L (30 ml/kg bw)  NUTRITION DIAGNOSIS:  Inadequate oral intake related to poor appetite/cancer and cancer related treatments AEB loss of 9% bw x3 months   DOCUMENTATION CODES:  Severe malnutrition-chronic. Criteria: Estimated intake that has met </= to 75% of needs for >/= to 1 month and weight loss of 9% bw in 3 months  INTERVENTION:  Discussed with pt the basics of nutrition therapy during treatment: minimize side effects w/ diet, prioritize eating high kcal/protein foods as able and eating frequently. All of this is done in attempt to maintain weight and, by association, improve outcomes.   RD first explained how cancer drastically increases energy expenditure while also decreasing appetite, hence while wt loss is common. Reviewed importance of eating, even if he doesn't feel like he should. May need to view appetite as medicine.   RD reviewed foods that are high in protein and kcals. RD explained concept of never eating a food by itself. He should add toppings, condiments, dressings, creams, syrups etc to meals. Examples given are adding whipped cream, chocolate syrup fruit to pancakes, adding cheese, crackers to soup and adding heaving dressings, nuts, cheese, to salads. These items are very calorie dense and little space in the stomach.   Spent time brainstorming appropriate meals/snacks  RD reccommended protein powder and taught how to effectively use it.  Very slight amounts of Unflavored whey protein  powder should be added to liquids: soups, cereal, drinks, hot cereals.   Encouraged on continued cessation of etoh.   He is currently drinking glucerna. His blood sugars are reportedly very well controlled. RD discussed that this is not the time to worry about tip-top BG control. He should be eating high kcal, fat foods and not worry about sugar  intake. Strongly encouraged to drink Ensure over the Glucerna for extra kcals/pro.   RD reviewed the utility of supplements. He comments these are cost prohibitive. RD explained Ensure assistance program. He is interested and accepts first case of Ensure. He is eligible for two more cases   Also gave handout titled "Increasing Calories and Protein" as well as several Boost/Ensure coupons  GOAL:  Wt stability/gain, tolerance to chemo  MONITOR:  Weight, reported intake, chemotherapy tolerance  Next Visit: 3-4 weeks  Burtis Junes RD, LDN, CNSC Clinical Nutrition Available Tues-Sat via Pager: [phone] 02/13/2018 11:04 AM

## 2018-02-14 ENCOUNTER — Encounter (HOSPITAL_COMMUNITY): Payer: Self-pay

## 2018-02-14 ENCOUNTER — Other Ambulatory Visit: Payer: Self-pay

## 2018-02-14 ENCOUNTER — Encounter (HOSPITAL_COMMUNITY)
Admission: RE | Admit: 2018-02-14 | Discharge: 2018-02-14 | Disposition: A | Discharge status: Home / Self Care | Payer: Medicare Other | Source: Ambulatory Visit | Attending: General Surgery | Admitting: General Surgery

## 2018-02-14 DIAGNOSIS — Z01812 Encounter for preprocedural laboratory examination: Secondary | ICD-10-CM | POA: Diagnosis not present

## 2018-02-14 HISTORY — DX: Chronic obstructive pulmonary disease, unspecified: J44.9

## 2018-02-14 HISTORY — DX: Bursopathy, unspecified: M71.9

## 2018-02-14 LAB — CBC WITH DIFFERENTIAL/PLATELET
BASOS ABS: 0.1 10*3/uL (ref 0.0–0.1)
Basophils Relative: 0 %
EOS PCT: 1 %
Eosinophils Absolute: 0.2 10*3/uL (ref 0.0–0.7)
HCT: 36.9 % — ABNORMAL LOW (ref 39.0–52.0)
HEMOGLOBIN: 12 g/dL — AB (ref 13.0–17.0)
LYMPHS ABS: 2.5 10*3/uL (ref 0.7–4.0)
LYMPHS PCT: 15 %
MCH: 29.3 pg (ref 26.0–34.0)
MCHC: 32.5 g/dL (ref 30.0–36.0)
MCV: 90 fL (ref 78.0–100.0)
Monocytes Absolute: 1.5 10*3/uL — ABNORMAL HIGH (ref 0.1–1.0)
Monocytes Relative: 9 %
NEUTROS ABS: 12.8 10*3/uL — AB (ref 1.7–7.7)
NEUTROS PCT: 75 %
PLATELETS: 604 10*3/uL — AB (ref 150–400)
RBC: 4.1 MIL/uL — ABNORMAL LOW (ref 4.22–5.81)
RDW: 14.2 % (ref 11.5–15.5)
WBC: 17 10*3/uL — AB (ref 4.0–10.5)

## 2018-02-14 LAB — BASIC METABOLIC PANEL
ANION GAP: 13 (ref 5–15)
BUN: 8 mg/dL (ref 6–20)
CHLORIDE: 102 mmol/L (ref 101–111)
CO2: 23 mmol/L (ref 22–32)
Calcium: 11.7 mg/dL — ABNORMAL HIGH (ref 8.9–10.3)
Creatinine, Ser: 0.59 mg/dL — ABNORMAL LOW (ref 0.61–1.24)
GFR calc Af Amer: >60 mL/min (ref >60)
GFR calc non Af Amer: >60 mL/min (ref >60)
GLUCOSE: 119 mg/dL — AB (ref 65–99)
Potassium: 3.6 mmol/L (ref 3.5–5.1)
SODIUM: 138 mmol/L (ref 135–145)

## 2018-02-14 LAB — HEMOGLOBIN A1C
Hgb A1c MFr Bld: 6.4 % — ABNORMAL HIGH (ref 4.8–5.6)
MEAN PLASMA GLUCOSE: 136.98 mg/dL

## 2018-02-14 LAB — GLUCOSE, CAPILLARY: Glucose-Capillary: 132 mg/dL — ABNORMAL HIGH (ref 65–99)

## 2018-02-15 NOTE — Pre-Procedure Instructions (Signed)
HgbA1C routed to PCP. 

## 2018-02-19 ENCOUNTER — Ambulatory Visit (HOSPITAL_COMMUNITY): Payer: Medicare Other | Admitting: Anesthesiology

## 2018-02-19 ENCOUNTER — Ambulatory Visit (HOSPITAL_COMMUNITY): Payer: Medicare Other

## 2018-02-19 ENCOUNTER — Encounter (HOSPITAL_COMMUNITY)
Admission: RE | Disposition: A | Discharge status: Home / Self Care | Payer: Self-pay | Source: Ambulatory Visit | Attending: General Surgery

## 2018-02-19 ENCOUNTER — Encounter (HOSPITAL_COMMUNITY): Payer: Self-pay | Admitting: Anesthesiology

## 2018-02-19 ENCOUNTER — Other Ambulatory Visit (HOSPITAL_COMMUNITY): Payer: Self-pay | Admitting: Pharmacist

## 2018-02-19 ENCOUNTER — Ambulatory Visit (HOSPITAL_COMMUNITY)
Admission: RE | Admit: 2018-02-19 | Discharge: 2018-02-19 | Disposition: A | Discharge status: Home / Self Care | Payer: Medicare Other | Source: Ambulatory Visit | Attending: General Surgery | Admitting: General Surgery

## 2018-02-19 DIAGNOSIS — E785 Hyperlipidemia, unspecified: Secondary | ICD-10-CM | POA: Diagnosis not present

## 2018-02-19 DIAGNOSIS — Z95828 Presence of other vascular implants and grafts: Secondary | ICD-10-CM

## 2018-02-19 DIAGNOSIS — J449 Chronic obstructive pulmonary disease, unspecified: Secondary | ICD-10-CM | POA: Diagnosis not present

## 2018-02-19 DIAGNOSIS — I2510 Atherosclerotic heart disease of native coronary artery without angina pectoris: Secondary | ICD-10-CM | POA: Diagnosis not present

## 2018-02-19 DIAGNOSIS — Z79899 Other long term (current) drug therapy: Secondary | ICD-10-CM | POA: Insufficient documentation

## 2018-02-19 DIAGNOSIS — I10 Essential (primary) hypertension: Secondary | ICD-10-CM | POA: Insufficient documentation

## 2018-02-19 DIAGNOSIS — E1151 Type 2 diabetes mellitus with diabetic peripheral angiopathy without gangrene: Secondary | ICD-10-CM | POA: Insufficient documentation

## 2018-02-19 DIAGNOSIS — Z955 Presence of coronary angioplasty implant and graft: Secondary | ICD-10-CM | POA: Diagnosis not present

## 2018-02-19 DIAGNOSIS — Z7984 Long term (current) use of oral hypoglycemic drugs: Secondary | ICD-10-CM | POA: Diagnosis not present

## 2018-02-19 DIAGNOSIS — Z7982 Long term (current) use of aspirin: Secondary | ICD-10-CM | POA: Diagnosis not present

## 2018-02-19 DIAGNOSIS — C3491 Malignant neoplasm of unspecified part of right bronchus or lung: Secondary | ICD-10-CM

## 2018-02-19 DIAGNOSIS — Z7951 Long term (current) use of inhaled steroids: Secondary | ICD-10-CM | POA: Insufficient documentation

## 2018-02-19 DIAGNOSIS — F17210 Nicotine dependence, cigarettes, uncomplicated: Secondary | ICD-10-CM | POA: Diagnosis not present

## 2018-02-19 DIAGNOSIS — K219 Gastro-esophageal reflux disease without esophagitis: Secondary | ICD-10-CM | POA: Diagnosis not present

## 2018-02-19 DIAGNOSIS — Z8546 Personal history of malignant neoplasm of prostate: Secondary | ICD-10-CM | POA: Diagnosis not present

## 2018-02-19 HISTORY — PX: PORTACATH PLACEMENT: SHX2246

## 2018-02-19 LAB — GLUCOSE, CAPILLARY
GLUCOSE-CAPILLARY: 97 mg/dL (ref 65–99)
Glucose-Capillary: 92 mg/dL (ref 65–99)

## 2018-02-19 SURGERY — INSERTION, TUNNELED CENTRAL VENOUS DEVICE, WITH PORT
Anesthesia: General | Site: Chest | Laterality: Left

## 2018-02-19 MED ORDER — PROPOFOL 500 MG/50ML IV EMUL
INTRAVENOUS | Status: DC | PRN
  Administered 2018-02-19: 50 ug/kg/min via INTRAVENOUS

## 2018-02-19 MED ORDER — HEPARIN SOD (PORK) LOCK FLUSH 100 UNIT/ML IV SOLN
INTRAVENOUS | Status: DC | PRN
  Administered 2018-02-19: 500 [IU]

## 2018-02-19 MED ORDER — KETOROLAC TROMETHAMINE 30 MG/ML IJ SOLN
INTRAMUSCULAR | Status: AC
  Filled 2018-02-19: qty 1

## 2018-02-19 MED ORDER — LIDOCAINE HCL (PF) 1 % IJ SOLN
INTRAMUSCULAR | Status: AC
  Filled 2018-02-19: qty 30

## 2018-02-19 MED ORDER — CHLORHEXIDINE GLUCONATE CLOTH 2 % EX PADS
6.0000 | MEDICATED_PAD | Freq: Once | CUTANEOUS | Status: AC
  Administered 2018-02-19: 6 via TOPICAL

## 2018-02-19 MED ORDER — VANCOMYCIN HCL IN DEXTROSE 1-5 GM/200ML-% IV SOLN
1000.0000 mg | INTRAVENOUS | Status: AC
  Administered 2018-02-19: 1000 mg via INTRAVENOUS
  Filled 2018-02-19: qty 200

## 2018-02-19 MED ORDER — LIDOCAINE HCL (PF) 1 % IJ SOLN
INTRAMUSCULAR | Status: DC | PRN
  Administered 2018-02-19: 9 mL

## 2018-02-19 MED ORDER — CHLORHEXIDINE GLUCONATE CLOTH 2 % EX PADS: 6.0000 | MEDICATED_PAD | Freq: Once | CUTANEOUS | Status: DC

## 2018-02-19 MED ORDER — LACTATED RINGERS IV SOLN
INTRAVENOUS | Status: DC
  Administered 2018-02-19: 12:00:00 via INTRAVENOUS

## 2018-02-19 MED ORDER — SODIUM CHLORIDE 0.9 % IJ SOLN
INTRAMUSCULAR | Status: DC | PRN
  Administered 2018-02-19: 50 mL via INTRAVENOUS

## 2018-02-19 MED ORDER — KETOROLAC TROMETHAMINE 30 MG/ML IJ SOLN
30.0000 mg | Freq: Once | INTRAMUSCULAR | Status: AC
  Administered 2018-02-19: 30 mg via INTRAVENOUS

## 2018-02-19 MED ORDER — HEPARIN SOD (PORK) LOCK FLUSH 100 UNIT/ML IV SOLN
INTRAVENOUS | Status: AC
Start: 2018-02-19 — End: ?
  Filled 2018-02-19: qty 5

## 2018-02-19 MED ORDER — PROPOFOL 10 MG/ML IV BOLUS
INTRAVENOUS | Status: DC | PRN
  Administered 2018-02-19 (×4): 20 mg via INTRAVENOUS

## 2018-02-19 SURGICAL SUPPLY — 34 items
APPLIER CLIP 9.375 SM OPEN (CLIP) ×3
BAG DECANTER FOR FLEXI CONT (MISCELLANEOUS) ×3 IMPLANT
BAG HAMPER (MISCELLANEOUS) ×3 IMPLANT
CATH HICKMAN DUAL 12.0 (CATHETERS) IMPLANT
CHLORAPREP W/TINT 10.5 ML (MISCELLANEOUS) ×3 IMPLANT
CLIP APPLIE 9.375 SM OPEN (CLIP) ×1 IMPLANT
CLOTH BEACON ORANGE TIMEOUT ST (SAFETY) ×3 IMPLANT
COVER LIGHT HANDLE STERIS (MISCELLANEOUS) ×6 IMPLANT
DECANTER SPIKE VIAL GLASS SM (MISCELLANEOUS) ×3 IMPLANT
DERMABOND ADVANCED (GAUZE/BANDAGES/DRESSINGS) ×2
DERMABOND ADVANCED .7 DNX12 (GAUZE/BANDAGES/DRESSINGS) ×1 IMPLANT
DRAPE C-ARM FOLDED MOBILE STRL (DRAPES) ×3 IMPLANT
ELECT REM PT RETURN 9FT ADLT (ELECTROSURGICAL) ×3
ELECTRODE REM PT RTRN 9FT ADLT (ELECTROSURGICAL) ×1 IMPLANT
GLOVE BIOGEL PI IND STRL 7.0 (GLOVE) ×1 IMPLANT
GLOVE BIOGEL PI INDICATOR 7.0 (GLOVE) ×2
GLOVE SURG SS PI 7.5 STRL IVOR (GLOVE) ×3 IMPLANT
GOWN STRL REUS W/TWL LRG LVL3 (GOWN DISPOSABLE) ×6 IMPLANT
IV NS 500ML (IV SOLUTION) ×2
IV NS 500ML BAXH (IV SOLUTION) ×1 IMPLANT
KIT PORT POWER 8FR ISP MRI (Port) ×3 IMPLANT
KIT TURNOVER KIT A (KITS) ×3 IMPLANT
MANIFOLD NEPTUNE II (INSTRUMENTS) ×3 IMPLANT
NEEDLE HYPO 25X1 1.5 SAFETY (NEEDLE) ×3 IMPLANT
PACK MINOR (CUSTOM PROCEDURE TRAY) ×3 IMPLANT
PAD ARMBOARD 7.5X6 YLW CONV (MISCELLANEOUS) ×3 IMPLANT
SET BASIN LINEN APH (SET/KITS/TRAYS/PACK) ×3 IMPLANT
SET INTRODUCER 12FR PACEMAKER (INTRODUCER) IMPLANT
SUT MNCRL AB 4-0 PS2 18 (SUTURE) ×3 IMPLANT
SUT VIC AB 3-0 SH 27 (SUTURE) ×2
SUT VIC AB 3-0 SH 27X BRD (SUTURE) ×1 IMPLANT
SYR 20CC LL (SYRINGE) ×3 IMPLANT
SYR 5ML LL (SYRINGE) ×3 IMPLANT
SYR CONTROL 10ML LL (SYRINGE) ×3 IMPLANT

## 2018-02-19 NOTE — Discharge Instructions (Signed)
Implanted Port Insertion, Care After °This sheet gives you information about how to care for yourself after your procedure. Your health care provider may also give you more specific instructions. If you have problems or questions, contact your health care provider. °What can I expect after the procedure? °After your procedure, it is common to have: °· Discomfort at the port insertion site. °· Bruising on the skin over the port. This should improve over 3-4 days. ° °Follow these instructions at home: °Port care °· After your port is placed, you will get a manufacturer's information card. The card has information about your port. Keep this card with you at all times. °· Take care of the port as told by your health care provider. Ask your health care provider if you or a family member can get training for taking care of the port at home. A home health care nurse may also take care of the port. °· Make sure to remember what type of port you have. °Incision care °· Follow instructions from your health care provider about how to take care of your port insertion site. Make sure you: °? Wash your hands with soap and water before you change your bandage (dressing). If soap and water are not available, use hand sanitizer. °? Change your dressing as told by your health care provider. °? Leave stitches (sutures), skin glue, or adhesive strips in place. These skin closures may need to stay in place for 2 weeks or longer. If adhesive strip edges start to loosen and curl up, you may trim the loose edges. Do not remove adhesive strips completely unless your health care provider tells you to do that. °· Check your port insertion site every day for signs of infection. Check for: °? More redness, swelling, or pain. °? More fluid or blood. °? Warmth. °? Pus or a bad smell. °General instructions °· Do not take baths, swim, or use a hot tub until your health care provider approves. °· Do not lift anything that is heavier than 10 lb (4.5  kg) for a week, or as told by your health care provider. °· Ask your health care provider when it is okay to: °? Return to work or school. °? Resume usual physical activities or sports. °· Do not drive for 24 hours if you were given a medicine to help you relax (sedative). °· Take over-the-counter and prescription medicines only as told by your health care provider. °· Wear a medical alert bracelet in case of an emergency. This will tell any health care providers that you have a port. °· Keep all follow-up visits as told by your health care provider. This is important. °Contact a health care provider if: °· You cannot flush your port with saline as directed, or you cannot draw blood from the port. °· You have a fever or chills. °· You have more redness, swelling, or pain around your port insertion site. °· You have more fluid or blood coming from your port insertion site. °· Your port insertion site feels warm to the touch. °· You have pus or a bad smell coming from the port insertion site. °Get help right away if: °· You have chest pain or shortness of breath. °· You have bleeding from your port that you cannot control. °Summary °· Take care of the port as told by your health care provider. °· Change your dressing as told by your health care provider. °· Keep all follow-up visits as told by your health care provider. °  This information is not intended to replace advice given to you by your health care provider. Make sure you discuss any questions you have with your health care provider. Document Released: 08/07/2013 Document Revised: 09/07/2016 Document Reviewed: 09/07/2016 Elsevier Interactive Patient Education  2017 [employer] PATIENT INSTRUCTIONS POST-ANESTHESIA  IMMEDIATELY FOLLOWING SURGERY:  Do not drive or operate machinery for the first twenty four hours after surgery.  Do not make any important decisions for twenty four hours after surgery or while taking narcotic pain medications or sedatives.   If you develop intractable nausea and vomiting or a severe headache please notify your doctor immediately.  FOLLOW-UP:  Please make an appointment with your surgeon as instructed. You do not need to follow up with anesthesia unless specifically instructed to do so.  WOUND CARE INSTRUCTIONS (if applicable):  Keep a dry clean dressing on the anesthesia/puncture wound site if there is drainage.  Once the wound has quit draining you may leave it open to air.  Generally you should leave the bandage intact for twenty four hours unless there is drainage.  If the epidural site drains for more than 36-48 hours please call the anesthesia department.  QUESTIONS?:  Please feel free to call your physician or the hospital operator if you have any questions, and they will be happy to assist you.         Implanted Port Insertion, Care After This sheet gives you information about how to care for yourself after your procedure. Your health care provider may also give you more specific instructions. If you have problems or questions, contact your health care provider. What can I expect after the procedure? After your procedure, it is common to have:  Discomfort at the port insertion site.  Bruising on the skin over the port. This should improve over 3-4 days.  Follow these instructions at home: [facility] care  After your port is placed, you will get a manufacturer's information card. The card has information about your port. Keep this card with you at all times.  Take care of the port as told by your health care provider. Ask your health care provider if you or a family member can get training for taking care of the port at home. A home health care nurse may also take care of the port.  Make sure to remember what type of port you have. Incision care  Follow instructions from your health care provider about how to take care of your port insertion site. Make sure you: ? Wash your hands with soap and water  before you change your bandage (dressing). If soap and water are not available, use hand sanitizer. ? Change your dressing as told by your health care provider. ? Leave stitches (sutures), skin glue, or adhesive strips in place. These skin closures may need to stay in place for 2 weeks or longer. If adhesive strip edges start to loosen and curl up, you may trim the loose edges. Do not remove adhesive strips completely unless your health care provider tells you to do that.  Check your port insertion site every day for signs of infection. Check for: ? More redness, swelling, or pain. ? More fluid or blood. ? Warmth. ? Pus or a bad smell. General instructions  Do not take baths, swim, or use a hot tub until your health care provider approves.  Do not lift anything that is heavier than 10 lb (4.5 kg) for a week, or as told by your health care provider.  Ask your health care provider when it is okay to: ? Return to work or school. ? Resume usual physical activities or sports.  Do not drive for 24 hours if you were given a medicine to help you relax (sedative).  Take over-the-counter and prescription medicines only as told by your health care provider.  Wear a medical alert bracelet in case of an emergency. This will tell any health care providers that you have a port.  Keep all follow-up visits as told by your health care provider. This is important. Contact a health care provider if:  You cannot flush your port with saline as directed, or you cannot draw blood from the port.  You have a fever or chills.  You have more redness, swelling, or pain around your port insertion site.  You have more fluid or blood coming from your port insertion site.  Your port insertion site feels warm to the touch.  You have pus or a bad smell coming from the port insertion site. Get help right away if:  You have chest pain or shortness of breath.  You have bleeding from your port that you cannot  control. Summary  Take care of the port as told by your health care provider.  Change your dressing as told by your health care provider.  Keep all follow-up visits as told by your health care provider. This information is not intended to replace advice given to you by your health care provider. Make sure you discuss any questions you have with your health care provider. Document Released: 08/07/2013 Document Revised: 09/07/2016 Document Reviewed: 09/07/2016 Elsevier Interactive Patient Education  2017 [employer].

## 2018-02-19 NOTE — Anesthesia Preprocedure Evaluation (Signed)
Anesthesia Evaluation  Patient identified by MRN, date of birth, ID band Patient awake    Reviewed: Allergy & Precautions, H&P , NPO status , Patient's Chart, lab work & pertinent test results, reviewed documented beta blocker date and time   Airway Mallampati: II  TM Distance: >3 FB Neck ROM: full    Dental no notable dental hx.    Pulmonary neg pulmonary ROS, COPD, Current Smoker,  Squamous NSCLC (HCC) Lung CA   Pulmonary exam normal breath sounds clear to auscultation       Cardiovascular Exercise Tolerance: Good hypertension, + CAD and + Peripheral Vascular Disease  negative cardio ROS   Rhythm:regular Rate:Normal     Neuro/Psych negative neurological ROS  negative psych ROS   GI/Hepatic negative GI ROS, Neg liver ROS, GERD  ,  Endo/Other  negative endocrine ROSdiabetes  Renal/GU negative Renal ROS  negative genitourinary   Musculoskeletal   Abdominal   Peds  Hematology negative hematology ROS (+)   Anesthesia Other Findings   Reproductive/Obstetrics negative OB ROS                             Anesthesia Physical Anesthesia Plan  ASA: II  Anesthesia Plan: General   Post-op Pain Management:    Induction:   PONV Risk Score and Plan:   Airway Management Planned:   Additional Equipment:   Intra-op Plan:   Post-operative Plan:   Informed Consent: I have reviewed the patients History and Physical, chart, labs and discussed the procedure including the risks, benefits and alternatives for the proposed anesthesia with the patient or authorized representative who has indicated his/her understanding and acceptance.   Dental Advisory Given  Plan Discussed with: CRNA  Anesthesia Plan Comments:         Anesthesia Quick Evaluation

## 2018-02-19 NOTE — Interval H&P Note (Signed)
History and Physical Interval Note:  02/19/2018 12:16 PM  Fred Hernandez  has presented today for surgery, with the diagnosis of right upper lobe lung cancer  The various methods of treatment have been discussed with the patient and family. After consideration of risks, benefits and other options for treatment, the patient has consented to  Procedure(s): INSERTION PORT-A-CATH (Left) as a surgical intervention .  The patient's history has been reviewed, patient examined, no change in status, stable for surgery.  I have reviewed the patient's chart and labs.  Questions were answered to the patient's satisfaction.     Aviva Signs

## 2018-02-19 NOTE — Addendum Note (Signed)
Addendum  created 02/19/18 1421 by Vista Deck, CRNA   Sign clinical note

## 2018-02-19 NOTE — Op Note (Signed)
Patient:  Fred Hernandez  DOB:  [birth year]  MRN:  [id number]   Preop Diagnosis: Squamous cell carcinoma, right lung  Postop Diagnosis: Same  Procedure: Port-A-Cath insertion  Surgeon: Aviva Signs, MD  Anes: MAC  Indications: Patient is a 62 year old black male who presents for Port-A-Cath insertion.  He is about to undergo chemotherapy for right lung carcinoma.  The risks and benefits of the procedure including bleeding, infection, and pneumothorax were fully explained to the patient, who gave informed consent.  Procedure note: The patient was placed in the Trendelenburg position after monitored anesthesia care was given.  The left upper chest was prepped and draped using the usual sterile technique with ChloraPrep.  Surgical site confirmation was performed.  1% Xylocaine was used for local anesthesia.  An incision was made below the left clavicle.  A subcutaneous pocket was formed.  The needle was advanced into the left subclavian vein using the Seldinger technique without difficulty.  A guidewire was then introduced into the right atrium under fluoroscopic guidance.  An introducer and peel-away sheath were placed over the guidewire.  The catheter was then inserted through the peel-away sheath and the peel-away sheath was removed.  The cath was then attached to the port and the port placed in subcutaneous pocket.  Adequate positioning was confirmed by fluoroscopy.  Good backflow of blood was noted from the port on aspiration.  The port was flushed with heparin flush.  Subcutaneous layer was reapproximated using a 3-0 Vicryl interrupted suture.  The skin was closed using a 4-0 Monocryl subcuticular suture.  Dermabond was applied.  All tape and needle counts were correct at the end of the procedure.  The patient was awakened and transferred to PACU in stable condition.  A chest x-ray will be performed at that time.  Complications: None  EBL: Minimal  Specimen: None

## 2018-02-19 NOTE — Anesthesia Postprocedure Evaluation (Signed)
Anesthesia Post Note  Patient: Fred Hernandez  Procedure(s) Performed: INSERTION PORT-A-CATH (Left Chest)  Patient location during evaluation: PACU Anesthesia Type: General Level of consciousness: awake and alert and patient cooperative Pain management: satisfactory to patient Vital Signs Assessment: post-procedure vital signs reviewed and stable Respiratory status: spontaneous breathing Cardiovascular status: stable Postop Assessment: no apparent nausea or vomiting Anesthetic complications: no     Last Vitals:  Vitals:   02/19/18 1132 02/19/18 1220  BP: 138/82 123/79  Pulse: 92   Resp:  (!) 24  Temp: 36.5 C   SpO2: 97% 97%    Last Pain:  Vitals:   02/19/18 1132  TempSrc: Oral  PainSc: 0-No pain                 Masen Luallen

## 2018-02-19 NOTE — Anesthesia Postprocedure Evaluation (Signed)
Anesthesia Post Note  Patient: Fred Hernandez  Procedure(s) Performed: INSERTION PORT-A-CATH (Left Chest)  Patient location during evaluation: Short Stay Anesthesia Type: General Level of consciousness: awake and alert and patient cooperative Pain management: satisfactory to patient Vital Signs Assessment: post-procedure vital signs reviewed and stable Respiratory status: spontaneous breathing Cardiovascular status: stable Postop Assessment: no apparent nausea or vomiting Anesthetic complications: no     Last Vitals:  Vitals:   02/19/18 1410 02/19/18 1415  BP:  (!) 145/85  Pulse: 88 93  Resp: 18 16  Temp:  36.6 C  SpO2: 99% 94%    Last Pain:  Vitals:   02/19/18 1415  TempSrc: Oral  PainSc: 0-No pain                 Frieda Arnall

## 2018-02-19 NOTE — Transfer of Care (Signed)
Immediate Anesthesia Transfer of Care Note  Patient: Fred Hernandez  Procedure(s) Performed: INSERTION PORT-A-CATH (Left Chest)  Patient Location: PACU  Anesthesia Type:General  Level of Consciousness: awake and patient cooperative  Airway & Oxygen Therapy: Patient Spontanous Breathing  Post-op Assessment: Report given to RN and Post -op Vital signs reviewed and stable  Post vital signs: Reviewed and stable  Last Vitals:  Vitals Value Taken Time  BP    Temp    Pulse 38 02/19/2018  1:41 PM  Resp 15 02/19/2018  1:41 PM  SpO2 83 % 02/19/2018  1:41 PM  Vitals shown include unvalidated device data.  Last Pain:  Vitals:   02/19/18 1132  TempSrc: Oral  PainSc: 0-No pain         Complications: No apparent anesthesia complications

## 2018-02-20 ENCOUNTER — Inpatient Hospital Stay (HOSPITAL_COMMUNITY): Payer: Medicare Other

## 2018-02-20 ENCOUNTER — Encounter (HOSPITAL_COMMUNITY): Payer: Self-pay | Admitting: Hematology

## 2018-02-20 ENCOUNTER — Encounter (HOSPITAL_COMMUNITY): Payer: Self-pay | Admitting: General Surgery

## 2018-02-20 ENCOUNTER — Encounter (HOSPITAL_COMMUNITY): Payer: Medicare Other | Admitting: Dietician

## 2018-02-20 ENCOUNTER — Other Ambulatory Visit: Payer: Self-pay

## 2018-02-20 VITALS — BP 119/72 | HR 90 | Temp 98.1°F | Resp 18 | Wt 143.0 lb

## 2018-02-20 DIAGNOSIS — Z7982 Long term (current) use of aspirin: Secondary | ICD-10-CM | POA: Diagnosis not present

## 2018-02-20 DIAGNOSIS — Z923 Personal history of irradiation: Secondary | ICD-10-CM | POA: Diagnosis not present

## 2018-02-20 DIAGNOSIS — K861 Other chronic pancreatitis: Secondary | ICD-10-CM | POA: Diagnosis not present

## 2018-02-20 DIAGNOSIS — R634 Abnormal weight loss: Secondary | ICD-10-CM | POA: Diagnosis not present

## 2018-02-20 DIAGNOSIS — Z79899 Other long term (current) drug therapy: Secondary | ICD-10-CM | POA: Diagnosis not present

## 2018-02-20 DIAGNOSIS — M25511 Pain in right shoulder: Secondary | ICD-10-CM | POA: Diagnosis not present

## 2018-02-20 DIAGNOSIS — I700 Atherosclerosis of aorta: Secondary | ICD-10-CM | POA: Diagnosis not present

## 2018-02-20 DIAGNOSIS — Z5112 Encounter for antineoplastic immunotherapy: Secondary | ICD-10-CM | POA: Diagnosis not present

## 2018-02-20 DIAGNOSIS — Z7984 Long term (current) use of oral hypoglycemic drugs: Secondary | ICD-10-CM | POA: Diagnosis not present

## 2018-02-20 DIAGNOSIS — C61 Malignant neoplasm of prostate: Secondary | ICD-10-CM | POA: Diagnosis not present

## 2018-02-20 DIAGNOSIS — J320 Chronic maxillary sinusitis: Secondary | ICD-10-CM | POA: Diagnosis not present

## 2018-02-20 DIAGNOSIS — C3491 Malignant neoplasm of unspecified part of right bronchus or lung: Secondary | ICD-10-CM | POA: Insufficient documentation

## 2018-02-20 DIAGNOSIS — C3411 Malignant neoplasm of upper lobe, right bronchus or lung: Secondary | ICD-10-CM | POA: Diagnosis present

## 2018-02-20 LAB — CBC WITH DIFFERENTIAL/PLATELET
BASOS ABS: 0 10*3/uL (ref 0.0–0.1)
Basophils Relative: 0 %
EOS PCT: 1 %
Eosinophils Absolute: 0.2 10*3/uL (ref 0.0–0.7)
HEMATOCRIT: 34.1 % — AB (ref 39.0–52.0)
Hemoglobin: 11 g/dL — ABNORMAL LOW (ref 13.0–17.0)
LYMPHS ABS: 2.7 10*3/uL (ref 0.7–4.0)
LYMPHS PCT: 15 %
MCH: 29 pg (ref 26.0–34.0)
MCHC: 32.3 g/dL (ref 30.0–36.0)
MCV: 90 fL (ref 78.0–100.0)
MONO ABS: 1.3 10*3/uL — AB (ref 0.1–1.0)
MONOS PCT: 7 %
NEUTROS ABS: 13.9 10*3/uL — AB (ref 1.7–7.7)
Neutrophils Relative %: 77 %
PLATELETS: 609 10*3/uL — AB (ref 150–400)
RBC: 3.79 MIL/uL — ABNORMAL LOW (ref 4.22–5.81)
RDW: 14.1 % (ref 11.5–15.5)
WBC: 18.2 10*3/uL — ABNORMAL HIGH (ref 4.0–10.5)

## 2018-02-20 LAB — COMPREHENSIVE METABOLIC PANEL
ALBUMIN: 3.1 g/dL — AB (ref 3.5–5.0)
ALT: 17 U/L (ref 17–63)
ANION GAP: 12 (ref 5–15)
AST: 17 U/L (ref 15–41)
Alkaline Phosphatase: 90 U/L (ref 38–126)
BUN: 10 mg/dL (ref 6–20)
CHLORIDE: 101 mmol/L (ref 101–111)
CO2: 24 mmol/L (ref 22–32)
Calcium: 11.3 mg/dL — ABNORMAL HIGH (ref 8.9–10.3)
Creatinine, Ser: 0.53 mg/dL — ABNORMAL LOW (ref 0.61–1.24)
GFR calc Af Amer: >60 mL/min (ref >60)
GFR calc non Af Amer: >60 mL/min (ref >60)
GLUCOSE: 107 mg/dL — AB (ref 65–99)
POTASSIUM: 3.8 mmol/L (ref 3.5–5.1)
SODIUM: 137 mmol/L (ref 135–145)
Total Bilirubin: 0.5 mg/dL (ref 0.3–1.2)
Total Protein: 7.3 g/dL (ref 6.5–8.1)

## 2018-02-20 MED ORDER — HEPARIN SOD (PORK) LOCK FLUSH 100 UNIT/ML IV SOLN
500.0000 [IU] | Freq: Once | INTRAVENOUS | Status: AC | PRN
  Administered 2018-02-20: 500 [IU]

## 2018-02-20 MED ORDER — OXYCODONE-ACETAMINOPHEN 5-325 MG PO TABS
1.0000 | ORAL_TABLET | Freq: Once | ORAL | Status: AC
  Administered 2018-02-20: 1 via ORAL
  Filled 2018-02-20: qty 1

## 2018-02-20 MED ORDER — FAMOTIDINE IN NACL 20-0.9 MG/50ML-% IV SOLN
20.0000 mg | Freq: Once | INTRAVENOUS | Status: AC
  Administered 2018-02-20: 20 mg via INTRAVENOUS

## 2018-02-20 MED ORDER — SODIUM CHLORIDE 0.9 % IV SOLN
Freq: Once | INTRAVENOUS | Status: AC
  Administered 2018-02-20: 12:00:00 via INTRAVENOUS

## 2018-02-20 MED ORDER — PALONOSETRON HCL INJECTION 0.25 MG/5ML
INTRAVENOUS | Status: AC
  Filled 2018-02-20: qty 5

## 2018-02-20 MED ORDER — DEXTROSE 5 % IV SOLN
45.0000 mg/m2 | Freq: Once | INTRAVENOUS | Status: AC
  Administered 2018-02-20: 78 mg via INTRAVENOUS
  Filled 2018-02-20: qty 13

## 2018-02-20 MED ORDER — PALONOSETRON HCL INJECTION 0.25 MG/5ML
0.2500 mg | Freq: Once | INTRAVENOUS | Status: AC
  Administered 2018-02-20: 0.25 mg via INTRAVENOUS

## 2018-02-20 MED ORDER — FAMOTIDINE IN NACL 20-0.9 MG/50ML-% IV SOLN
INTRAVENOUS | Status: AC
  Filled 2018-02-20: qty 50

## 2018-02-20 MED ORDER — DEXAMETHASONE SODIUM PHOSPHATE 100 MG/10ML IJ SOLN
20.0000 mg | Freq: Once | INTRAMUSCULAR | Status: AC
  Administered 2018-02-20: 20 mg via INTRAVENOUS
  Filled 2018-02-20: qty 2

## 2018-02-20 MED ORDER — DIPHENHYDRAMINE HCL 50 MG/ML IJ SOLN
50.0000 mg | Freq: Once | INTRAMUSCULAR | Status: AC
  Administered 2018-02-20: 50 mg via INTRAVENOUS

## 2018-02-20 MED ORDER — SODIUM CHLORIDE 0.9 % IV SOLN
INTRAVENOUS | Status: DC
  Administered 2018-02-20: 13:00:00 via INTRAVENOUS

## 2018-02-20 MED ORDER — CARBOPLATIN CHEMO INJECTION 450 MG/45ML
225.8000 mg | Freq: Once | INTRAVENOUS | Status: AC
  Administered 2018-02-20: 230 mg via INTRAVENOUS
  Filled 2018-02-20: qty 23

## 2018-02-20 MED ORDER — DIPHENHYDRAMINE HCL 50 MG/ML IJ SOLN
INTRAMUSCULAR | Status: AC
  Filled 2018-02-20: qty 1

## 2018-02-20 NOTE — Progress Notes (Signed)
Tolerated infusions w/o adverse reaction.  Alert, in no distress.  VSS.  Discharged ambulatory in c/o family.

## 2018-02-21 ENCOUNTER — Encounter (HOSPITAL_COMMUNITY): Payer: Self-pay | Admitting: *Deleted

## 2018-02-21 NOTE — Progress Notes (Signed)
Contacted pt for 24 hour f/u after first carbo/taxol tx - reports he is feeling well, has good energy, and has been eating well.  Instructed to call the clinic with any questions or concerns that may arise.  Verbalizes understanding.

## 2018-02-23 ENCOUNTER — Ambulatory Visit: Payer: Medicare Other | Admitting: Urology

## 2018-02-23 ENCOUNTER — Telehealth (HOSPITAL_COMMUNITY): Payer: Self-pay

## 2018-02-23 NOTE — Telephone Encounter (Signed)
Patients fiancee, Royden Purl, called stating patient was having chest, back and shoulder pain. The pain is so bad he cannot raise his arms.  He is having some SOB and difficulty breathing at times. He feels so bad he is not going to his radiation appointment today. Reviewed with provider, instructed fiancee to take patient to ER to be checked out. [name] verbalized understanding.

## 2018-02-27 ENCOUNTER — Inpatient Hospital Stay (HOSPITAL_COMMUNITY): Payer: Medicare Other

## 2018-02-27 ENCOUNTER — Ambulatory Visit (HOSPITAL_COMMUNITY): Payer: Medicare Other | Admitting: Hematology

## 2018-02-27 ENCOUNTER — Encounter (HOSPITAL_COMMUNITY): Payer: Medicare Other

## 2018-02-27 ENCOUNTER — Encounter (HOSPITAL_COMMUNITY): Payer: Self-pay | Admitting: Hematology

## 2018-02-27 ENCOUNTER — Other Ambulatory Visit: Payer: Self-pay

## 2018-02-27 ENCOUNTER — Inpatient Hospital Stay (HOSPITAL_BASED_OUTPATIENT_CLINIC_OR_DEPARTMENT_OTHER): Payer: Medicare Other | Admitting: Hematology

## 2018-02-27 VITALS — BP 131/76 | HR 126 | Temp 99.3°F | Resp 18 | Wt 138.5 lb

## 2018-02-27 VITALS — BP 115/66 | HR 105 | Temp 99.1°F | Resp 18

## 2018-02-27 DIAGNOSIS — J320 Chronic maxillary sinusitis: Secondary | ICD-10-CM | POA: Diagnosis not present

## 2018-02-27 DIAGNOSIS — Z7982 Long term (current) use of aspirin: Secondary | ICD-10-CM | POA: Diagnosis not present

## 2018-02-27 DIAGNOSIS — C3411 Malignant neoplasm of upper lobe, right bronchus or lung: Secondary | ICD-10-CM | POA: Diagnosis not present

## 2018-02-27 DIAGNOSIS — Z923 Personal history of irradiation: Secondary | ICD-10-CM

## 2018-02-27 DIAGNOSIS — C3491 Malignant neoplasm of unspecified part of right bronchus or lung: Secondary | ICD-10-CM

## 2018-02-27 DIAGNOSIS — M25511 Pain in right shoulder: Secondary | ICD-10-CM

## 2018-02-27 DIAGNOSIS — K861 Other chronic pancreatitis: Secondary | ICD-10-CM | POA: Diagnosis not present

## 2018-02-27 DIAGNOSIS — Z5112 Encounter for antineoplastic immunotherapy: Secondary | ICD-10-CM | POA: Diagnosis not present

## 2018-02-27 DIAGNOSIS — R634 Abnormal weight loss: Secondary | ICD-10-CM

## 2018-02-27 DIAGNOSIS — Z79899 Other long term (current) drug therapy: Secondary | ICD-10-CM

## 2018-02-27 DIAGNOSIS — Z7984 Long term (current) use of oral hypoglycemic drugs: Secondary | ICD-10-CM | POA: Diagnosis not present

## 2018-02-27 DIAGNOSIS — I700 Atherosclerosis of aorta: Secondary | ICD-10-CM | POA: Diagnosis not present

## 2018-02-27 DIAGNOSIS — C61 Malignant neoplasm of prostate: Secondary | ICD-10-CM | POA: Diagnosis not present

## 2018-02-27 LAB — COMPREHENSIVE METABOLIC PANEL
ALT: 34 U/L (ref 17–63)
AST: 40 U/L (ref 15–41)
Albumin: 3.3 g/dL — ABNORMAL LOW (ref 3.5–5.0)
Alkaline Phosphatase: 122 U/L (ref 38–126)
Anion gap: 15 (ref 5–15)
BUN: 11 mg/dL (ref 6–20)
CHLORIDE: 100 mmol/L — AB (ref 101–111)
CO2: 23 mmol/L (ref 22–32)
CREATININE: 0.65 mg/dL (ref 0.61–1.24)
Calcium: 11.3 mg/dL — ABNORMAL HIGH (ref 8.9–10.3)
GFR calc Af Amer: >60 mL/min (ref >60)
GFR calc non Af Amer: >60 mL/min (ref >60)
GLUCOSE: 131 mg/dL — AB (ref 65–99)
Potassium: 4.2 mmol/L (ref 3.5–5.1)
SODIUM: 138 mmol/L (ref 135–145)
Total Bilirubin: 1.1 mg/dL (ref 0.3–1.2)
Total Protein: 8.3 g/dL — ABNORMAL HIGH (ref 6.5–8.1)

## 2018-02-27 LAB — CBC WITH DIFFERENTIAL/PLATELET
BASOS ABS: 0 10*3/uL (ref 0.0–0.1)
Basophils Relative: 0 %
EOS ABS: 0.2 10*3/uL (ref 0.0–0.7)
EOS PCT: 1 %
HCT: 35.8 % — ABNORMAL LOW (ref 39.0–52.0)
Hemoglobin: 11.6 g/dL — ABNORMAL LOW (ref 13.0–17.0)
Lymphocytes Relative: 10 %
Lymphs Abs: 1.8 10*3/uL (ref 0.7–4.0)
MCH: 29.1 pg (ref 26.0–34.0)
MCHC: 32.4 g/dL (ref 30.0–36.0)
MCV: 89.7 fL (ref 78.0–100.0)
Monocytes Absolute: 1.8 10*3/uL — ABNORMAL HIGH (ref 0.1–1.0)
Monocytes Relative: 10 %
Neutro Abs: 14.6 10*3/uL — ABNORMAL HIGH (ref 1.7–7.7)
Neutrophils Relative %: 79 %
Platelets: 690 10*3/uL — ABNORMAL HIGH (ref 150–400)
RBC: 3.99 MIL/uL — AB (ref 4.22–5.81)
RDW: 13.9 % (ref 11.5–15.5)
WBC: 18.4 10*3/uL — AB (ref 4.0–10.5)

## 2018-02-27 MED ORDER — HEPARIN SOD (PORK) LOCK FLUSH 100 UNIT/ML IV SOLN
500.0000 [IU] | Freq: Once | INTRAVENOUS | Status: AC | PRN
  Administered 2018-02-27: 500 [IU]
  Filled 2018-02-27: qty 5

## 2018-02-27 MED ORDER — SODIUM CHLORIDE 0.9 % IV SOLN
225.8000 mg | Freq: Once | INTRAVENOUS | Status: AC
  Administered 2018-02-27: 230 mg via INTRAVENOUS
  Filled 2018-02-27: qty 23

## 2018-02-27 MED ORDER — DEXTROSE 5 % IV SOLN
45.0000 mg/m2 | Freq: Once | INTRAVENOUS | Status: AC
  Administered 2018-02-27: 78 mg via INTRAVENOUS
  Filled 2018-02-27: qty 13

## 2018-02-27 MED ORDER — OXYCODONE-ACETAMINOPHEN 5-325 MG PO TABS: 1.0000 | ORAL_TABLET | Freq: Four times a day (QID) | ORAL | 0 refills | Status: DC | PRN

## 2018-02-27 MED ORDER — DRONABINOL 5 MG PO CAPS: 5.0000 mg | ORAL_CAPSULE | Freq: Two times a day (BID) | ORAL | 1 refills | Status: DC

## 2018-02-27 MED ORDER — DIPHENHYDRAMINE HCL 50 MG/ML IJ SOLN
50.0000 mg | Freq: Once | INTRAMUSCULAR | Status: AC
  Administered 2018-02-27: 50 mg via INTRAVENOUS
  Filled 2018-02-27: qty 1

## 2018-02-27 MED ORDER — PALONOSETRON HCL INJECTION 0.25 MG/5ML
0.2500 mg | Freq: Once | INTRAVENOUS | Status: AC
  Administered 2018-02-27: 0.25 mg via INTRAVENOUS
  Filled 2018-02-27: qty 5

## 2018-02-27 MED ORDER — FAMOTIDINE IN NACL 20-0.9 MG/50ML-% IV SOLN
20.0000 mg | Freq: Once | INTRAVENOUS | Status: AC
  Administered 2018-02-27: 20 mg via INTRAVENOUS
  Filled 2018-02-27: qty 50

## 2018-02-27 MED ORDER — DEXAMETHASONE SODIUM PHOSPHATE 100 MG/10ML IJ SOLN
20.0000 mg | Freq: Once | INTRAMUSCULAR | Status: AC
  Administered 2018-02-27: 20 mg via INTRAVENOUS
  Filled 2018-02-27: qty 2

## 2018-02-27 MED ORDER — OXYCODONE-ACETAMINOPHEN 5-325 MG PO TABS
1.0000 | ORAL_TABLET | Freq: Once | ORAL | Status: AC
  Administered 2018-02-27: 1 via ORAL
  Filled 2018-02-27: qty 1

## 2018-02-27 MED ORDER — SODIUM CHLORIDE 0.9 % IV SOLN
Freq: Once | INTRAVENOUS | Status: AC
  Administered 2018-02-27: 11:00:00 via INTRAVENOUS

## 2018-02-27 NOTE — Patient Instructions (Signed)
[facility] Discharge Instructions for Patients Receiving Chemotherapy   Beginning January 23rd 2017 lab work for the Cancer Center will be done in the  Main lab at [facility] on 1st floor. If you have a lab appointment with the Cancer Center please come in thru the  Main Entrance and check in at the main information desk   Today you received the following chemotherapy agents   To help prevent nausea and vomiting after your treatment, we encourage you to take your nausea medication     If you develop nausea and vomiting, or diarrhea that is not controlled by your medication, call the clinic.  The clinic phone number is [phone]. Office hours are Monday-Friday 8:30am-5:00pm.  BELOW ARE SYMPTOMS THAT SHOULD BE REPORTED IMMEDIATELY:  *FEVER GREATER THAN 101.0 F  *CHILLS WITH OR WITHOUT FEVER  NAUSEA AND VOMITING THAT IS NOT CONTROLLED WITH YOUR NAUSEA MEDICATION  *UNUSUAL SHORTNESS OF BREATH  *UNUSUAL BRUISING OR BLEEDING  TENDERNESS IN MOUTH AND THROAT WITH OR WITHOUT PRESENCE OF ULCERS  *URINARY PROBLEMS  *BOWEL PROBLEMS  UNUSUAL RASH Items with * indicate a potential emergency and should be followed up as soon as possible. If you have an emergency after office hours please contact your primary care physician or go to the nearest emergency department.  Please call the clinic during office hours if you have any questions or concerns.   You may also contact the Patient Navigator at [phone] should you have any questions or need assistance in obtaining follow up care.      Resources For Cancer Patients and their Caregivers ? American Cancer Society: Can assist with transportation, wigs, general needs, runs Look Good Feel Better.        1-888-227-6333 ? Cancer Care: Provides financial assistance, online support groups, medication/co-pay assistance.  1-800-813-HOPE (4673) ? [facility] Assists [place] cancer  patients and their families through emotional , educational and financial support.  336-427-4357 ? [place] DSS Where to apply for food stamps, Medicaid and utility assistance. 336-342-1394 ? [facility]: Transportation to medical appointments. 336-347-2287 ? Social Security Administration: May apply for disability if have a Stage IV cancer. 336-342-7796 1-800-772-1213 ? [place] Aging, Disability and Transit Services: Assists with nutrition, care and transit needs. 336-349-2343         

## 2018-02-27 NOTE — Progress Notes (Signed)
Treatment given per orders. Patient tolerated it well without problems. Vitals stable and discharged home from clinic ambulatory. Follow up as scheduled.  

## 2018-02-27 NOTE — Assessment & Plan Note (Signed)
1.  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 -Patient evaluated by Dr. Roxan Hockey and was recommended to have preoperative chemoradiation therapy.  He started first cycle of chemo on 02/20/2018.  He has tolerated it very well.  He will proceed with cycle 2 without any dose modifications.  I have reviewed his blood work.  We will see him back  next week.   2.  Weight loss: He lost about 15-20 pounds since January due to decreased appetite.  He is drinking Glucerna about 1 to 3 cans/day.  His appetite has decreased since start of therapy.  I have suggested him to drink 3 to 4 cans of Ensure Plus.  I will start him on Marinol 5 mg twice daily for his appetite stimulation.  3.  Recurrent prostate cancer: He had a history of prostate cancer and reportedly received radiation therapy 6-7 years ago.  He had prostate biopsies on 01/17/2018 which showed recurrence with a Gleason score (5+5)=10.  He will be treated with GnRH agonist.

## 2018-02-27 NOTE — Patient Instructions (Signed)
[facility] at [facility] Discharge Instructions  Today you saw Dr. K.   Thank you for choosing [facility] at [facility] to provide your oncology and hematology care.  To afford each patient quality time with our provider, please arrive at least 15 minutes before your scheduled appointment time.   If you have a lab appointment with the Cancer Center please come in thru the  Main Entrance and check in at the main information desk  You need to re-schedule your appointment should you arrive 10 or more minutes late.  We strive to give you quality time with our providers, and arriving late affects you and other patients whose appointments are after yours.  Also, if you no show three or more times for appointments you may be dismissed from the clinic at the providers discretion.     Again, thank you for choosing [facility].  Our hope is that these requests will decrease the amount of time that you wait before being seen by our physicians.       _____________________________________________________________  Should you have questions after your visit to [facility], please contact our office at [phone] between the hours of 8:30 a.m. and 4:30 p.m.  Voicemails left after 4:30 p.m. will not be returned until the following business day.  For prescription refill requests, have your pharmacy contact our office.       Resources For Cancer Patients and their Caregivers ? American Cancer Society: Can assist with transportation, wigs, general needs, runs Look Good Feel Better.        1-888-227-6333 ? Cancer Care: Provides financial assistance, online support groups, medication/co-pay assistance.  1-800-813-HOPE (4673) ? [facility] Assists [place] cancer patients and their families through emotional , educational and financial support.  336-427-4357 ? [place] DSS Where to apply for food  stamps, Medicaid and utility assistance. 336-342-1394 ? [facility]: Transportation to medical appointments. 336-347-2287 ? Social Security Administration: May apply for disability if have a Stage IV cancer. 336-342-7796 1-800-772-1213 ? [place] Aging, Disability and Transit Services: Assists with nutrition, care and transit needs. 336-349-2343  Cancer Center Support Programs:   > Cancer Support Group  2nd Tuesday of the month 1pm-2pm, Journey Room   > Creative Journey  3rd Tuesday of the month 1130am-1pm, Journey Room    

## 2018-02-27 NOTE — Progress Notes (Addendum)
[place]  Patient Care Team: Vidal Schwalbe, MD as PCP - General (Family Medicine) [name], MD as PCP - Cardiology (Cardiology) [name], MD as Consulting Physician (Gastroenterology)  DIAGNOSIS:  Encounter Diagnoses  Name Primary?  . Squamous cell carcinoma of right lung ([place]) Yes  . Adenocarcinoma of prostate ([place])   . Weight loss, unintentional     CHIEF COMPLIANT: Follow-up of newly diagnosed lung cancer.  HISTORY OF PRESENT ILLNESS: Fred Hernandez is a pleasant 62 year old African-American male who is seen in follow-up for newly diagnosed lung cancer.  He presented with right apical lung mass, initial presentation with cough and right shoulder pain.  He had a CT-guided biopsy on 01/15/2018.  An MRI of the brain was done and of last month.  He also had prostate biopsies done recently.  He lost about 15-20 pounds since January secondary to decreased appetite.  He does report occasional tingling in his feet at nighttime.  Right shoulder pain is of 3-4 months duration.  He reports that he is able to move well and do all his activities at home.  He is accompanied by his brother, sister and a fianc.   BRIEF ONCOLOGIC HISTORY:    Squamous cell carcinoma of right lung ([place])   12/13/2017 PET scan    IMPRESSION: 1. 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 There is early bony invasion of the right second rib and cortical destruction indicating local rib/chest wall invasion by tumor. 2. There is additional pleural thickening and pleural calcification along the right posterior pleural surface which is not separately hypermetabolic. 3. Small mediastinal lymph nodes are not appreciably hypermetabolic. 4. Multifocal and some diffuse regions of high activity in the bowel are likely physiologic. 5. No findings of metastatic disease to the neck, abdomen/pelvis, or to the  included remainder of the skeleton. 6. Other imaging findings of potential clinical significance: Chronic right maxillary sinusitis. Aortic Atherosclerosis (ICD10-I70.0) and Emphysema (ICD10-J43.9). Chronic calcific pancreatitis.       01/15/2018 Initial Diagnosis    Squamous cell carcinoma of right lung ([place])      01/15/2018 Initial Biopsy    Lung needle core biopsy RUL: Squamous cell carcinoma      02/19/2018 Procedure    Port-a-cath placement by Dr. Arnoldo Morale       02/20/2018 -  Chemotherapy    The patient had palonosetron (ALOXI) injection 0.25 mg, 0.25 mg, Intravenous,  Once, 1 of 4 cycles Administration: 0.25 mg (02/20/2018) CARBOplatin (PARAPLATIN) 230 mg in sodium chloride 0.9 % 250 mL chemo infusion, 230 mg (100 % of original dose 225.8 mg), Intravenous,  Once, 1 of 4 cycles Dose modification:   (original dose 225.8 mg, Cycle 1) PACLitaxel (TAXOL) 78 mg in dextrose 5 % 250 mL chemo infusion (</= 80mg /m2), 45 mg/m2 = 78 mg, Intravenous,  Once, 1 of 4 cycles Administration: 78 mg (02/20/2018)  for chemotherapy treatment.        Adenocarcinoma of prostate ([place])   10/24/2015 Tumor Marker    PSA 1.53      01/06/2016 Tumor Marker    PSA 1.6      07/31/2017 Tumor Marker    PSA 4.6      01/17/2018 Initial Diagnosis    Adenocarcinoma of prostate ([place])      01/17/2018 Procedure    12-core prostate biopsy revealed 6/12 cores positive for prostate adenocarcinoma (Dr[facility]).  (L) base lat: Gleason  9 (5+4), 20%. (L) mid lat: Gleason 9 (4+5), 40%. (L) apex lat: Gleason 9 (4+5), 20%. (L) base: Gleason 9 (5+4), 20%. (L) mid: Gleason 10 (5+5), 5%. (L) apex: Gleason 9 (5+4), 9%.           INTERVAL HISTORY:  [name] 62 y.o. male returns for follow-up for lung cancer.  Also with recently diagnosed Gleason 10 (5+5) adenocarcinoma of prostate.    Here today with family.   S/p port-a-cath placement on 02/19/18 by Dr. Arnoldo Morale.  Also s/p cycle #1 chemotherapy with  Carbo/Taxol on 02/20/18.  He started radiation in [place] last week.   He has lost ~7 lbs in the past 2 weeks or so.  Overall, he feels "so-so."  His family states that he ate well for the first day of chemo, but then no appetite thereafter.  He drinks 1-4 Boost/Ensure/Glucerna per day, less when he eats a meal and more when he doesn't eat well.  Denies mouth sores, N&V, peripheral neuropathy.   Recommended he increase Boost/Ensure Plus intake per day; needs ~1800+ calories per day.  Could try Marinol for appetite 5 mg BID.    He is c/o continued (R) scapular/shoulder pain; using Percocet.  Requiring ~3-4 tabs per day. Pain is constant. He is requesting refill of pain medications today.     REVIEW OF SYSTEMS:   Constitutional: Denies fevers, chills but has 15-20 pound weight loss since January.  Is from decreased appetite. Eyes: Denies blurriness of vision Ears, nose, mouth, throat, and face: Denies mucositis or sore throat Respiratory: Has baseline cough from COPD.  No hemoptysis. Cardiovascular: Denies palpitation, chest discomfort Gastrointestinal:  Denies nausea, heartburn or change in bowel habits Skin: Denies abnormal skin rashes Lymphatics: Denies new lymphadenopathy or easy bruising Neurological: He has occasional numbness in the feet at nighttime.  Other weakness. Behavioral/Psych: Mood is stable, no new changes  Extremities: No lower extremity edema  All other systems were reviewed with the patient and are negative.  I have reviewed the past medical history, past surgical history, social history and family history with the patient and they are unchanged from previous note.  ALLERGIES:  is allergic to lisinopril; penicillins; and ace inhibitors.  MEDICATIONS:  Current Outpatient Medications  Medication Sig Dispense Refill  . aspirin 81 MG chewable tablet Chew 81 mg by mouth daily.    [name] atorvastatin (LIPITOR) 80 MG tablet Take 80 mg by mouth at bedtime.     [name] CARBOPLATIN IV  Inject into the vein. 6 weekly treatments with radiation    . cilostazol (PLETAL) 50 MG tablet TAKE 1 TABLET(50 MG) BY MOUTH TWICE DAILY 60 tablet 6  . Coenzyme Q10 100 MG capsule Take 100 mg by mouth at bedtime.     [name] FLOVENT HFA 110 MCG/ACT inhaler Inhale 1 puff into the lungs 2 (two) times daily.  2  . fluticasone (FLONASE) 50 MCG/ACT nasal spray Place 1 spray into both nostrils daily.    [name] ipratropium-albuterol (DUONEB) 0.5-2.5 (3) MG/3ML SOLN Take 3 mLs by nebulization every 6 (six) hours as needed (for shortness of breath).    . lidocaine-prilocaine (EMLA) cream Apply a quarter size amount to affected area 1 hour prior to coming to chemotherapy.  Do not rub in.  Cover with plastic wrap. 30 g 2  . metFORMIN (GLUCOPHAGE) 500 MG tablet Take 500 mg by mouth 2 (two) times daily.     . metoprolol succinate (TOPROL-XL) 50 MG 24 hr tablet Take 50 mg by mouth at  bedtime.     . Omega-3 Fatty Acids (FISH OIL) 1000 MG CAPS Take 1 capsule by mouth 2 (two) times daily.     . ondansetron (ZOFRAN) 8 MG tablet Take 1 tablet (8 mg total) by mouth every 8 (eight) hours as needed for nausea or vomiting. 30 tablet 2  . oxyCODONE-acetaminophen (PERCOCET/ROXICET) 5-325 MG tablet Take 1 tablet by mouth every 6 (six) hours as needed for severe pain. 56 tablet 0  . PACLitaxel (TAXOL IV) Inject into the vein. 6 weekly treatments with radiation    . pantoprazole (PROTONIX) 40 MG tablet Take 40 mg by mouth 2 (two) times daily.    . prochlorperazine (COMPAZINE) 10 MG tablet Take 1 tablet (10 mg total) by mouth every 6 (six) hours as needed for nausea or vomiting. 30 tablet 2  . dronabinol (MARINOL) 5 MG capsule Take 1 capsule (5 mg total) by mouth 2 (two) times daily before a meal. 60 capsule 1   No current facility-administered medications for this visit.     PHYSICAL EXAMINATION: ECOG PERFORMANCE STATUS: 1 - Symptomatic but completely ambulatory  Vitals:   02/27/18 1005  BP: 131/76  Pulse: (!) 126  Resp: 18    Temp: 99.3 F (37.4 C)  SpO2: 100%   Filed Weights   02/27/18 1005  Weight: 138 lb 8 oz (62.8 kg)    GENERAL:alert, no distress and comfortable SKIN: skin color, texture, turgor are normal, no rashes or significant lesions EYES: normal, Conjunctiva are pink and non-injected, sclera clear OROPHARYNX:no mucositis, no erythema and lips, buccal mucosa, and tongue normal  NECK: supple, thyroid normal size, non-tender, without nodularity LYMPH:  no palpable lymphadenopathy in the cervical, axillary or inguinal LUNGS: clear to auscultation and percussion with normal breathing effort HEART: regular rate & rhythm and no murmurs and no lower extremity edema ABDOMEN:abdomen soft, non-tender and normal bowel sounds MUSCULOSKELETAL:no cyanosis of digits and no clubbing   EXTREMITIES: No lower extremity edema   LABORATORY DATA:  I have reviewed the data as listed CMP Latest Ref Rng & Units 02/27/2018 02/20/2018 02/14/2018  Glucose 65 - 99 mg/dL 131(H) 107(H) 119(H)  BUN 6 - 20 mg/dL 11 10 8   Creatinine 0.61 - 1.24 mg/dL 0.65 0.53(L) 0.59(L)  Sodium 135 - 145 mmol/L 138 137 138  Potassium 3.5 - 5.1 mmol/L 4.2 3.8 3.6  Chloride 101 - 111 mmol/L 100(L) 101 102  CO2 22 - 32 mmol/L 23 24 23   Calcium 8.9 - 10.3 mg/dL 11.3(H) 11.3(H) 11.7(H)  Total Protein 6.5 - 8.1 g/dL 8.3(H) 7.3 -  Total Bilirubin 0.3 - 1.2 mg/dL 1.1 0.5 -  Alkaline Phos 38 - 126 U/L 122 90 -  AST 15 - 41 U/L 40 17 -  ALT 17 - 63 U/L 34 17 -   No results found for: BTD176   Lab Results  Component Value Date   WBC 18.4 (H) 02/27/2018   HGB 11.6 (L) 02/27/2018   HCT 35.8 (L) 02/27/2018   MCV 89.7 02/27/2018   PLT 690 (H) 02/27/2018   NEUTROABS 14.6 (H) 02/27/2018    ASSESSMENT & PLAN:  Squamous cell carcinoma of right lung ([place]) 1.  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 -Patient evaluated  by Dr. Roxan Hockey and was recommended to have preoperative chemoradiation therapy.  He started first cycle of chemo on 02/20/2018.  He has tolerated it very well.  He will proceed with cycle 2 without any dose modifications.  I have reviewed his blood work.  We will see him back  next week.   2.  Weight loss: He lost about 15-20 pounds since January due to decreased appetite.  He is drinking Glucerna about 1 to 3 cans/day.  His appetite has decreased since start of therapy.  I have suggested him to drink 3 to 4 cans of Ensure Plus.  I will start him on Marinol 5 mg twice daily for his appetite stimulation.  3.  Recurrent prostate cancer: He had a history of prostate cancer and reportedly received radiation therapy 6-7 years ago.  He had prostate biopsies on 01/17/2018 which showed recurrence with a Gleason score (5+5)=10.  He will be treated with GnRH agonist.    The patient has a good understanding of the overall plan. he agrees with it. he will call with any problems that may develop before the next visit here.  This note includes documentation from Mike Craze, NP, who was present during this patient's office visit and evaluation.  I have reviewed this note for its completeness and accuracy.  I have edited this note accordingly based on my findings and medical opinion.      Derek Jack, MD 02/27/18

## 2018-03-01 ENCOUNTER — Inpatient Hospital Stay (HOSPITAL_COMMUNITY)
Admission: RE | Admit: 2018-03-01 | Payer: Medicare Other | Source: Ambulatory Visit | Attending: Cardiovascular Disease | Admitting: Cardiovascular Disease

## 2018-03-01 ENCOUNTER — Telehealth (HOSPITAL_COMMUNITY): Payer: Self-pay | Admitting: Emergency Medicine

## 2018-03-01 NOTE — Telephone Encounter (Signed)
Pt had told [facility] RAD ONC nurse that he was having trouble sleeping for the past few nights.  He know this is rel;ated to chemo.  Spoke with Dr Raliegh Ip and he wants him to try melatonin 5 mg for a few nights.  If this does not work call back on Monday and we will given him Ambien 5 mg at bedtime to take.  Verbalized understanding.

## 2018-03-06 ENCOUNTER — Encounter (HOSPITAL_COMMUNITY): Payer: Self-pay

## 2018-03-06 ENCOUNTER — Inpatient Hospital Stay (HOSPITAL_COMMUNITY): Payer: Medicare Other | Attending: Hematology

## 2018-03-06 ENCOUNTER — Encounter (HOSPITAL_COMMUNITY): Payer: Medicare Other | Admitting: Dietician

## 2018-03-06 ENCOUNTER — Encounter (HOSPITAL_COMMUNITY): Payer: Self-pay | Admitting: Hematology

## 2018-03-06 ENCOUNTER — Inpatient Hospital Stay (HOSPITAL_COMMUNITY): Payer: Medicare Other

## 2018-03-06 ENCOUNTER — Inpatient Hospital Stay (HOSPITAL_BASED_OUTPATIENT_CLINIC_OR_DEPARTMENT_OTHER): Payer: Medicare Other | Admitting: Hematology

## 2018-03-06 ENCOUNTER — Encounter: Payer: Self-pay | Admitting: General Practice

## 2018-03-06 ENCOUNTER — Inpatient Hospital Stay (HOSPITAL_COMMUNITY): Payer: Medicare Other | Admitting: Dietician

## 2018-03-06 ENCOUNTER — Other Ambulatory Visit (HOSPITAL_COMMUNITY): Payer: Medicare Other

## 2018-03-06 VITALS — BP 121/67 | HR 103 | Temp 98.3°F | Resp 18 | Wt 131.8 lb

## 2018-03-06 DIAGNOSIS — C3411 Malignant neoplasm of upper lobe, right bronchus or lung: Secondary | ICD-10-CM | POA: Insufficient documentation

## 2018-03-06 DIAGNOSIS — E43 Unspecified severe protein-calorie malnutrition: Secondary | ICD-10-CM | POA: Diagnosis not present

## 2018-03-06 DIAGNOSIS — R300 Dysuria: Secondary | ICD-10-CM | POA: Insufficient documentation

## 2018-03-06 DIAGNOSIS — Z6820 Body mass index (BMI) 20.0-20.9, adult: Secondary | ICD-10-CM | POA: Insufficient documentation

## 2018-03-06 DIAGNOSIS — R634 Abnormal weight loss: Secondary | ICD-10-CM | POA: Diagnosis not present

## 2018-03-06 DIAGNOSIS — Z5111 Encounter for antineoplastic chemotherapy: Secondary | ICD-10-CM | POA: Insufficient documentation

## 2018-03-06 DIAGNOSIS — F17210 Nicotine dependence, cigarettes, uncomplicated: Secondary | ICD-10-CM | POA: Diagnosis not present

## 2018-03-06 DIAGNOSIS — G893 Neoplasm related pain (acute) (chronic): Secondary | ICD-10-CM | POA: Insufficient documentation

## 2018-03-06 DIAGNOSIS — C3491 Malignant neoplasm of unspecified part of right bronchus or lung: Secondary | ICD-10-CM

## 2018-03-06 DIAGNOSIS — D649 Anemia, unspecified: Secondary | ICD-10-CM | POA: Diagnosis not present

## 2018-03-06 DIAGNOSIS — C61 Malignant neoplasm of prostate: Secondary | ICD-10-CM | POA: Insufficient documentation

## 2018-03-06 LAB — CBC WITH DIFFERENTIAL/PLATELET
Basophils Absolute: 0 10*3/uL (ref 0.0–0.1)
Basophils Relative: 0 %
EOS ABS: 0.1 10*3/uL (ref 0.0–0.7)
EOS PCT: 1 %
HCT: 33.1 % — ABNORMAL LOW (ref 39.0–52.0)
HEMOGLOBIN: 10.7 g/dL — AB (ref 13.0–17.0)
LYMPHS ABS: 1 10*3/uL (ref 0.7–4.0)
Lymphocytes Relative: 8 %
MCH: 28.8 pg (ref 26.0–34.0)
MCHC: 32.3 g/dL (ref 30.0–36.0)
MCV: 89 fL (ref 78.0–100.0)
MONOS PCT: 11 %
Monocytes Absolute: 1.4 10*3/uL — ABNORMAL HIGH (ref 0.1–1.0)
Neutro Abs: 9.7 10*3/uL — ABNORMAL HIGH (ref 1.7–7.7)
Neutrophils Relative %: 80 %
PLATELETS: 619 10*3/uL — AB (ref 150–400)
RBC: 3.72 MIL/uL — AB (ref 4.22–5.81)
RDW: 13.7 % (ref 11.5–15.5)
WBC: 12.2 10*3/uL — AB (ref 4.0–10.5)

## 2018-03-06 LAB — COMPREHENSIVE METABOLIC PANEL
ALK PHOS: 193 U/L — AB (ref 38–126)
ALT: 103 U/L — AB (ref 17–63)
AST: 87 U/L — ABNORMAL HIGH (ref 15–41)
Albumin: 3.2 g/dL — ABNORMAL LOW (ref 3.5–5.0)
Anion gap: 14 (ref 5–15)
BUN: 14 mg/dL (ref 6–20)
CALCIUM: 10.7 mg/dL — AB (ref 8.9–10.3)
CO2: 24 mmol/L (ref 22–32)
CREATININE: 0.66 mg/dL (ref 0.61–1.24)
Chloride: 98 mmol/L — ABNORMAL LOW (ref 101–111)
Glucose, Bld: 125 mg/dL — ABNORMAL HIGH (ref 65–99)
Potassium: 4.3 mmol/L (ref 3.5–5.1)
Sodium: 136 mmol/L (ref 135–145)
TOTAL PROTEIN: 8.6 g/dL — AB (ref 6.5–8.1)
Total Bilirubin: 1.2 mg/dL (ref 0.3–1.2)

## 2018-03-06 MED ORDER — SODIUM CHLORIDE 0.9 % IV SOLN
20.0000 mg | Freq: Once | INTRAVENOUS | Status: AC
  Administered 2018-03-06: 20 mg via INTRAVENOUS
  Filled 2018-03-06: qty 2

## 2018-03-06 MED ORDER — FAMOTIDINE IN NACL 20-0.9 MG/50ML-% IV SOLN
INTRAVENOUS | Status: AC
  Filled 2018-03-06: qty 50

## 2018-03-06 MED ORDER — SODIUM CHLORIDE 0.9 % IV SOLN
225.8000 mg | Freq: Once | INTRAVENOUS | Status: AC
  Administered 2018-03-06: 230 mg via INTRAVENOUS
  Filled 2018-03-06: qty 23

## 2018-03-06 MED ORDER — HEPARIN SOD (PORK) LOCK FLUSH 100 UNIT/ML IV SOLN
500.0000 [IU] | Freq: Once | INTRAVENOUS | Status: AC | PRN
  Administered 2018-03-06: 500 [IU]

## 2018-03-06 MED ORDER — SODIUM CHLORIDE 0.9 % IV SOLN
Freq: Once | INTRAVENOUS | Status: AC
  Administered 2018-03-06: 12:00:00 via INTRAVENOUS

## 2018-03-06 MED ORDER — PALONOSETRON HCL INJECTION 0.25 MG/5ML
0.2500 mg | Freq: Once | INTRAVENOUS | Status: AC
  Administered 2018-03-06: 0.25 mg via INTRAVENOUS

## 2018-03-06 MED ORDER — PACLITAXEL CHEMO INJECTION 300 MG/50ML
45.0000 mg/m2 | Freq: Once | INTRAVENOUS | Status: AC
  Administered 2018-03-06: 78 mg via INTRAVENOUS
  Filled 2018-03-06: qty 13

## 2018-03-06 MED ORDER — SODIUM CHLORIDE 0.9% FLUSH
10.0000 mL | INTRAVENOUS | Status: DC | PRN
  Administered 2018-03-06: 10 mL
  Filled 2018-03-06: qty 10

## 2018-03-06 MED ORDER — PALONOSETRON HCL INJECTION 0.25 MG/5ML
INTRAVENOUS | Status: AC
  Filled 2018-03-06: qty 5

## 2018-03-06 MED ORDER — DIPHENHYDRAMINE HCL 50 MG/ML IJ SOLN
INTRAMUSCULAR | Status: AC
  Filled 2018-03-06: qty 1

## 2018-03-06 MED ORDER — FAMOTIDINE IN NACL 20-0.9 MG/50ML-% IV SOLN
20.0000 mg | Freq: Once | INTRAVENOUS | Status: AC
  Administered 2018-03-06: 20 mg via INTRAVENOUS

## 2018-03-06 MED ORDER — DIPHENHYDRAMINE HCL 50 MG/ML IJ SOLN
50.0000 mg | Freq: Once | INTRAMUSCULAR | Status: AC
  Administered 2018-03-06: 50 mg via INTRAVENOUS

## 2018-03-06 NOTE — Progress Notes (Signed)
[facility] CSW Progress Notes  Brief visit w patient in infusion room to assess for needs/resources.  Patient accompanied by support person, no needs for home support, resources or transportation at this time.  Calendar provided, support group encouraged.  Patient also requested information on cancer support at [facility], information provided.  Edwyna Shell, LCSW Clinical Social Worker Phone:  [phone]

## 2018-03-06 NOTE — Assessment & Plan Note (Signed)
1.  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 -Patient evaluated by Dr. Roxan Hockey and was recommended to have preoperative chemoradiation therapy. -Chemoradiation therapy with carboplatin and paclitaxel started on 02/20/2018.  He will proceed with week 3 today without any dose modifications.  2.  Weight loss: He is continuously losing weight.  He lost about 7 pounds in the last 1 week.  We have started him on Marinol 5 mg twice a day last week.  He was able to take 3 tablets only.  It caused him to have confusion.  He is drinking about 3 to 4 cans of Ensure/boost per day.  I have told him to increase it to 7 to 8 cans/day.  He is not eating solid food at all.  He has a follow-up with our dietitian later today.  3.  Recurrent prostate cancer: He had a history of prostate cancer and reportedly received radiation therapy 6-7 years ago.  He had prostate biopsies on 01/17/2018 which showed recurrence with a Gleason score (5+5)=10.  He will be treated with GnRH agonist.

## 2018-03-06 NOTE — Patient Instructions (Signed)
[facility] Discharge Instructions for Patients Receiving Chemotherapy  Today you received the following chemotherapy agents taxol and carboplatin.    If you develop nausea and vomiting that is not controlled by your nausea medication, call the clinic.   BELOW ARE SYMPTOMS THAT SHOULD BE REPORTED IMMEDIATELY:  *FEVER GREATER THAN 100.5 F  *CHILLS WITH OR WITHOUT FEVER  NAUSEA AND VOMITING THAT IS NOT CONTROLLED WITH YOUR NAUSEA MEDICATION  *UNUSUAL SHORTNESS OF BREATH  *UNUSUAL BRUISING OR BLEEDING  TENDERNESS IN MOUTH AND THROAT WITH OR WITHOUT PRESENCE OF ULCERS  *URINARY PROBLEMS  *BOWEL PROBLEMS  UNUSUAL RASH Items with * indicate a potential emergency and should be followed up as soon as possible.  Feel free to call the clinic should you have any questions or concerns. The clinic phone number is [phone].  Please show the [facility] at check-in to the Emergency Department and triage nurse.

## 2018-03-06 NOTE — Progress Notes (Signed)
Patient to be seen by oncologist today and chemotherapy.  Patient complains of rash and itching on anterior neck.  Discomfort in right arm pit area, perineum, and back from biopsy area.  Patient described as discomfort and burning.  Poor appetite and energy level.  Family at side.   Labs reviewed with Dr. Delton Coombes with AST 87 and ALT 103 and alkaline phosphatase 193 for today.  Ok to treat today per Dr. Delton Coombes.    Patient tolerated chemotherapy with no complaints voiced.  Port site clean and dry with no bruising or swelling noted at site.  Good blood return noted before and after administration.  Band aid applied.  VSS with discharge and left ambulatory with no s/s of distress noted.

## 2018-03-06 NOTE — Progress Notes (Signed)
Fred Hernandez  Patient Care Team: Vidal Schwalbe, MD as PCP - General (Family Medicine) [name], MD as PCP - Cardiology (Cardiology) [name], MD as Consulting Physician (Gastroenterology)  DIAGNOSIS:  Encounter Diagnosis  Name Primary?  . Squamous cell carcinoma of right lung (HCC)     CHIEF COMPLIANT: Follow-up of newly diagnosed lung cancer.  HISTORY OF PRESENT ILLNESS: Fred Hernandez is a pleasant 62 year old African-American male who is seen in follow-up for newly diagnosed lung cancer.  He presented with right apical lung mass, initial presentation with cough and right shoulder pain.  He had a CT-guided biopsy on 01/15/2018.  An MRI of the brain was done and of last month.  He also had prostate biopsies done recently.  He lost about 15-20 pounds since January secondary to decreased appetite.  He does report occasional tingling in his feet at nighttime.  Right shoulder pain is of 3-4 months duration.  He reports that he is able to move well and do all his activities at home.  He is accompanied by his brother, sister and a fianc.   BRIEF ONCOLOGIC HISTORY:    Squamous cell carcinoma of right lung ([place])   12/13/2017 PET scan    IMPRESSION: 1. 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 There is early bony invasion of the right second rib and cortical destruction indicating local rib/chest wall invasion by tumor. 2. There is additional pleural thickening and pleural calcification along the right posterior pleural surface which is not separately hypermetabolic. 3. Small mediastinal lymph nodes are not appreciably hypermetabolic. 4. Multifocal and some diffuse regions of high activity in the bowel are likely physiologic. 5. No findings of metastatic disease to the neck, abdomen/pelvis, or to the included remainder of the skeleton. 6. Other imaging findings of potential  clinical significance: Chronic right maxillary sinusitis. Aortic Atherosclerosis (ICD10-I70.0) and Emphysema (ICD10-J43.9). Chronic calcific pancreatitis.       01/15/2018 Initial Diagnosis    Squamous cell carcinoma of right lung ([place])      01/15/2018 Initial Biopsy    Lung needle core biopsy RUL: Squamous cell carcinoma      02/19/2018 Procedure    Port-a-cath placement by Dr. Arnoldo Morale       02/20/2018 -  Chemotherapy    The patient had palonosetron (ALOXI) injection 0.25 mg, 0.25 mg, Intravenous,  Once, 1 of 4 cycles Administration: 0.25 mg (02/20/2018) CARBOplatin (PARAPLATIN) 230 mg in sodium chloride 0.9 % 250 mL chemo infusion, 230 mg (100 % of original dose 225.8 mg), Intravenous,  Once, 1 of 4 cycles Dose modification:   (original dose 225.8 mg, Cycle 1) PACLitaxel (TAXOL) 78 mg in dextrose 5 % 250 mL chemo infusion (</= 80mg /m2), 45 mg/m2 = 78 mg, Intravenous,  Once, 1 of 4 cycles Administration: 78 mg (02/20/2018)  for chemotherapy treatment.        Adenocarcinoma of prostate ([place])   10/24/2015 Tumor Marker    PSA 1.53      01/06/2016 Tumor Marker    PSA 1.6      07/31/2017 Tumor Marker    PSA 4.6      01/17/2018 Initial Diagnosis    Adenocarcinoma of prostate ([place])      01/17/2018 Procedure    12-core prostate biopsy revealed 6/12 cores positive for prostate adenocarcinoma ([facility]).  (L) base lat: Gleason 9 (5+4), 20%. (L) mid lat: Gleason 9 (4+5), 40%. (L) apex lat:  Gleason 9 (4+5), 20%. (L) base: Gleason 9 (5+4), 20%. (L) mid: Gleason 10 (5+5), 5%. (L) apex: Gleason 9 (5+4), 9%.           INTERVAL HISTORY:  Fred Hernandez 62 y.o. male returns for follow-up for lung cancer.  Also with recently diagnosed Gleason 10 (5+5) adenocarcinoma of prostate.    Here today with family.   He has lost another ~7 lbs in the past week. Weight ~131 lbs today.  He took the Marinol for 3 doses, "but it caused me to talk out of my head and I didn't like it."   Reports that it did help his appetite, but he does not like the way it made him feel.  He is drinking 3-4 Boost/Ensure per day.  He does not like the taste of the Boost/Ensure; "it just tastes dirty."  He is not eating much solid food at all.  His family tells me that he eats only about 1 day per week.  Reinforced the importance of him consuming ~1800 calories per day.    Denies peripheral neuropathy. He endorses constipation alternating with diarrhea.  He is taking Percocet PRN; he takes stool softeners PRN.  Recommended he start taking stool softeners more regularly.   He is due to see nutritionist today.       REVIEW OF SYSTEMS:   Constitutional: Denies fevers, chills but has 15-20 pound weight loss since January.  Is from decreased appetite. Eyes: Denies blurriness of vision Ears, nose, mouth, throat, and face: Denies mucositis or sore throat Respiratory: Has baseline cough from COPD.  No hemoptysis. Cardiovascular: Denies palpitation, chest discomfort Gastrointestinal: Positive for constipation.   Skin: Denies abnormal skin rashes Lymphatics: Denies new lymphadenopathy or easy bruising Neurological: He has occasional numbness in the feet at nighttime.  Other weakness. Behavioral/Psych: Mood is stable, no new changes  Extremities: No lower extremity edema  All other systems were reviewed with the patient and are negative.  I have reviewed the past medical history, past surgical history, social history and family history with the patient and they are unchanged from previous note.  ALLERGIES:  is allergic to lisinopril; penicillins; and ace inhibitors.  MEDICATIONS:  Current Outpatient Medications  Medication Sig Dispense Refill  . aspirin 81 MG chewable tablet Chew 81 mg by mouth daily.    [name] atorvastatin (LIPITOR) 80 MG tablet Take 80 mg by mouth at bedtime.     [name] CARBOPLATIN IV Inject into the vein. 6 weekly treatments with radiation    . cilostazol (PLETAL) 50 MG tablet TAKE 1  TABLET(50 MG) BY MOUTH TWICE DAILY 60 tablet 6  . Coenzyme Q10 100 MG capsule Take 100 mg by mouth at bedtime.     . dronabinol (MARINOL) 5 MG capsule Take 1 capsule (5 mg total) by mouth 2 (two) times daily before a meal. 60 capsule 1  . FLOVENT HFA 110 MCG/ACT inhaler Inhale 1 puff into the lungs 2 (two) times daily.  2  . fluticasone (FLONASE) 50 MCG/ACT nasal spray Place 1 spray into both nostrils daily.    [name] ipratropium-albuterol (DUONEB) 0.5-2.5 (3) MG/3ML SOLN Take 3 mLs by nebulization every 6 (six) hours as needed (for shortness of breath).    . lidocaine-prilocaine (EMLA) cream Apply a quarter size amount to affected area 1 hour prior to coming to chemotherapy.  Do not rub in.  Cover with plastic wrap. 30 g 2  . metFORMIN (GLUCOPHAGE) 500 MG tablet Take 500 mg by mouth 2 (two) times  daily.     . metoprolol succinate (TOPROL-XL) 50 MG 24 hr tablet Take 50 mg by mouth at bedtime.     . Omega-3 Fatty Acids (FISH OIL) 1000 MG CAPS Take 1 capsule by mouth 2 (two) times daily.     . ondansetron (ZOFRAN) 8 MG tablet Take 1 tablet (8 mg total) by mouth every 8 (eight) hours as needed for nausea or vomiting. 30 tablet 2  . oxyCODONE-acetaminophen (PERCOCET/ROXICET) 5-325 MG tablet Take 1 tablet by mouth every 6 (six) hours as needed for severe pain. 56 tablet 0  . PACLitaxel (TAXOL IV) Inject into the vein. 6 weekly treatments with radiation    . pantoprazole (PROTONIX) 40 MG tablet Take 40 mg by mouth 2 (two) times daily.    . prochlorperazine (COMPAZINE) 10 MG tablet Take 1 tablet (10 mg total) by mouth every 6 (six) hours as needed for nausea or vomiting. 30 tablet 2   No current facility-administered medications for this visit.    Facility-Administered Medications Ordered in Other Visits  Medication Dose Route Frequency Provider Last Rate Last Dose  . sodium chloride flush (NS) 0.9 % injection 10 mL  10 mL Intracatheter PRN Holley Bouche, NP   10 mL at 03/06/18 1130    PHYSICAL  EXAMINATION: ECOG PERFORMANCE STATUS: 1 - Symptomatic but completely ambulatory  I have reviewed his vitals. GENERAL:alert, no distress and comfortable SKIN: skin color, texture, turgor are normal, no rashes or significant lesions EYES: normal, Conjunctiva are pink and non-injected, sclera clear OROPHARYNX:no mucositis, no erythema and lips, buccal mucosa, and tongue normal  NECK: supple, thyroid normal size, non-tender, without nodularity LYMPH:  no palpable lymphadenopathy in the cervical, axillary or inguinal LUNGS: clear to auscultation and percussion with normal breathing effort HEART: regular rate & rhythm and no murmurs and no lower extremity edema ABDOMEN:abdomen soft, non-tender and normal bowel sounds MUSCULOSKELETAL:no cyanosis of digits and no clubbing   EXTREMITIES: No lower extremity edema   LABORATORY DATA:  I have reviewed the data as listed CMP Latest Ref Rng & Units 03/06/2018 02/27/2018 02/20/2018  Glucose 65 - 99 mg/dL 125(H) 131(H) 107(H)  BUN 6 - 20 mg/dL 14 11 10   Creatinine 0.61 - 1.24 mg/dL 0.66 0.65 0.53(L)  Sodium 135 - 145 mmol/L 136 138 137  Potassium 3.5 - 5.1 mmol/L 4.3 4.2 3.8  Chloride 101 - 111 mmol/L 98(L) 100(L) 101  CO2 22 - 32 mmol/L 24 23 24   Calcium 8.9 - 10.3 mg/dL 10.7(H) 11.3(H) 11.3(H)  Total Protein 6.5 - 8.1 g/dL 8.6(H) 8.3(H) 7.3  Total Bilirubin 0.3 - 1.2 mg/dL 1.2 1.1 0.5  Alkaline Phos 38 - 126 U/L 193(H) 122 90  AST 15 - 41 U/L 87(H) 40 17  ALT 17 - 63 U/L 103(H) 34 17   No results found for: EYC144   Lab Results  Component Value Date   WBC 12.2 (H) 03/06/2018   HGB 10.7 (L) 03/06/2018   HCT 33.1 (L) 03/06/2018   MCV 89.0 03/06/2018   PLT 619 (H) 03/06/2018   NEUTROABS 9.7 (H) 03/06/2018    ASSESSMENT & PLAN:  Squamous cell carcinoma of right lung ([place]) 1.  Newly diagnosed stage IIb/IIIa (T3/T4 N0 M0) squamous cell carcinoma of the right upper lobe: -MRI of the brain without contrast was negative for metastatic  disease. -Patient evaluated by Dr. Roxan Hockey and was recommended to have preoperative chemoradiation therapy. -Chemoradiation therapy with carboplatin and paclitaxel started on 02/20/2018.  He will proceed with week 3  today without any dose modifications.  2.  Weight loss: He is continuously losing weight.  He lost about 7 pounds in the last 1 week.  We have started him on Marinol 5 mg twice a day last week.  He was able to take 3 tablets only.  It caused him to have confusion.  He is drinking about 3 to 4 cans of Ensure/boost per day.  I have told him to increase it to 7 to 8 cans/day.  He is not eating solid food at all.  He has a follow-up with our dietitian later today.  3.  Recurrent prostate cancer: He had a history of prostate cancer and reportedly received radiation therapy 6-7 years ago.  He had prostate biopsies on 01/17/2018 which showed recurrence with a Gleason score (5+5)=10.  He will be treated with GnRH agonist.  Total time spent is 25 minutes with more than 50% of the time spent face-to-face discussing his primary diagnosis and weight loss.  The patient has a good understanding of the overall plan. he agrees with it. he will call with any problems that may develop before the next visit here.  This note includes documentation from Mike Craze, NP, who was present during this patient's office visit and evaluation.  I have reviewed this note for its completeness and accuracy.  I have edited this note accordingly based on my findings and medical opinion.      Derek Jack, MD 03/06/18

## 2018-03-06 NOTE — Progress Notes (Signed)
Nutrition Follow-up 62 y/o male PMHx tobacco abuse, COPD, PAD, DM2, HLD/HTN, CAD, GERD, Prostate Cancer. CT performed 1/21 d/t  chest pain revealed likely lung cancer, w/ subsequent biopsy confirmation. Currently undergoing chemoradiation.   Patient seen as f/u today. Iinitially seen 4/16 for a consult regarding wt loss/poor appetite. Unfortunately, patient has declined, severely, in step wise fashion (see below), since seen. He was 147 lbs 6 oz when seen at office visit and, at that time, had already lost 15 lbs from his weight at diagnosis of 162 lbs. Today, he shows a weight of 131 lbs; a loss of 16 lbs in just 3 weeks.   Spouse reports the patients intake consists of light beverages (water, cranberry juice, occasional soda), 3-4 Ensure/Boost supplements per day and "bites" of meals. She says the patient eats ONE meal each week and it is every Tuesday, after he receives tx, because he is given steroids with his chemotherapy that make him hungry.   RD asked what supplements these were to assess kcal intake. Spouse maintains RD had given them case of Ensure Original?? ( Do not believe this is accurate as [facility] had only stocked ensure plus).  He took 4 doses of the marinol and stopped due to adverse reaction. Apparently, when taking these the patient would laugh uncontrollably, eyes glass over, and, more or less, "got high". He refuses to take the medication anymore. This is unfortunate as he says he DID have an appetite for a brief period when taking these.   Patient Denies any acute or chemo related n/v/c/d, pain, or anything other that anorexia, contributing to his dismal intake.    Wt Readings from Last 10 Encounters:  03/06/18 131 lb 12.8 oz (59.8 kg)  02/27/18 138 lb 8 oz (62.8 kg)  02/20/18 143 lb (64.9 kg)  02/19/18 145 lb (65.8 kg)  02/14/18 145 lb (65.8 kg)  02/13/18 145 lb (65.8 kg)  01/25/18 147 lb 6.4 oz (66.9 kg)  01/15/18 155 lb (70.3 kg)  01/03/18 156 lb (70.8 kg)  12/22/17 156  lb 9.6 oz (71 kg)   NUTRITION - FOCUSED PHYSICAL EXAM: Severe muscle/fat wasting   MEDICATIONS: Chemo: Carboplatin, taxol.  Supportive meds: Compazine, Omega 3s, perocet, zofran,  Other: metformin  LABS:  Albumin: 3.2 (stbale) Glu: 125, Increasing elevated LFTs, Renal labs stable and WDL  Recent Labs  Lab 03/06/18 1023  NA 136  K 4.3  CL 98*  CO2 24  BUN 14  CREATININE 0.66  CALCIUM 10.7*  GLUCOSE 125*    ANTHROPOMETRICS: Height:  5\' 8"   (172.72 cm) Weight: 131 lbs 12.8 oz (59.8 kg) BMI: 20.04-healthy wt for ht UBW:   167 lbs  IBW:    154 lbs (70 kg)              ESTIMATED ENERGY NEEDS:  Kcal:    ~2100 kcals (35  kcal/kg bw)  Protein: ~90 g Pro (1.5 g/kg bw) Fluid:   >2.1 L (35 ml/kg bw)   NUTRITION DIAGNOSIS:  Severe malnutrition related to cancer and cancer related treatments AEB loss of 16 lbs (>10%) in just 3 wks.   DOCUMENTATION CODES:  Severe malnutrition; acute context  INTERVENTION:  Based on his significant intake essentially only being 3-4 Ensure supplements per day, he is likely only receiving 1000-1400 kcals/day, far below needs  Despite prior education at initial assessment regarding need to eat REGARDLESS of appetite, the patient still is not eating because he is not hungry.   RD reiterated that normal  hunger cues are NOT working properly during cancer and he CANNOT rely on these as he has been. He needs to view his nutrition as medicine.   RD reviewed how his stomach has adapted to minimal intake. To re-expand his stomach, he needs to eat constantly throughout the day and every time he feels he is full, he should take 1-2 more bites of food. RD reiterated that while it may be easier to obtain sufficient intake with liquids (take up less room in stomach), he is not restricted to these. RD reviewed appropriate food choices, such as mashed potatoes with gravy/cheese.   RD gave and reviewed handout titled "Making the Most of Each Bite". RD wrote the  patients estimated kcal and protein needs (listed above) on top of handout, along with the instruction to "eat constantly throughout the day".   RD gave him his 2nd case of Ensure today. He is eligible for 1 more. RD noted that, to meet his estimated kcal needs, he needs to drink 6 of provided ensures/day. He is given a multitude of coupons for Ensure/Boost.     Patient/spouse seemed to appreciate the sense of urgency that RD tried to convey. Pt is determined to change habits, stating "I dont care what I got to do, Im not going out like this"  GOAL:  Oral intake to meet >90% needs, wt stability  MONITOR:  Intake, weight, chemotherapy tolerance, goals of care  Next Visit:  1-2 Weeks. Will follow and f/u as indicated. Do not feel RD has any further appropriate interventions to offer other than just reinforce prior education.   Burtis Junes RD, LDN, CNSC Clinical Nutrition Available Tues-Sat via Pager: [phone] 03/06/2018 1:10 PM

## 2018-03-07 ENCOUNTER — Ambulatory Visit: Payer: Medicare Other | Admitting: Cardiovascular Disease

## 2018-03-13 ENCOUNTER — Inpatient Hospital Stay (HOSPITAL_COMMUNITY): Payer: Medicare Other | Admitting: Dietician

## 2018-03-13 ENCOUNTER — Inpatient Hospital Stay (HOSPITAL_BASED_OUTPATIENT_CLINIC_OR_DEPARTMENT_OTHER): Payer: Medicare Other | Admitting: Adult Health

## 2018-03-13 ENCOUNTER — Encounter (HOSPITAL_COMMUNITY): Payer: Self-pay

## 2018-03-13 ENCOUNTER — Encounter (HOSPITAL_COMMUNITY): Payer: Self-pay | Admitting: Adult Health

## 2018-03-13 ENCOUNTER — Inpatient Hospital Stay (HOSPITAL_COMMUNITY): Payer: Medicare Other

## 2018-03-13 ENCOUNTER — Other Ambulatory Visit: Payer: Self-pay

## 2018-03-13 VITALS — BP 111/67 | HR 103 | Temp 97.6°F | Resp 18 | Wt 130.6 lb

## 2018-03-13 DIAGNOSIS — E43 Unspecified severe protein-calorie malnutrition: Secondary | ICD-10-CM

## 2018-03-13 DIAGNOSIS — F17210 Nicotine dependence, cigarettes, uncomplicated: Secondary | ICD-10-CM | POA: Diagnosis not present

## 2018-03-13 DIAGNOSIS — R634 Abnormal weight loss: Secondary | ICD-10-CM

## 2018-03-13 DIAGNOSIS — C61 Malignant neoplasm of prostate: Secondary | ICD-10-CM | POA: Diagnosis not present

## 2018-03-13 DIAGNOSIS — Z6820 Body mass index (BMI) 20.0-20.9, adult: Secondary | ICD-10-CM | POA: Diagnosis not present

## 2018-03-13 DIAGNOSIS — C3491 Malignant neoplasm of unspecified part of right bronchus or lung: Secondary | ICD-10-CM

## 2018-03-13 DIAGNOSIS — Z5111 Encounter for antineoplastic chemotherapy: Secondary | ICD-10-CM

## 2018-03-13 DIAGNOSIS — C3411 Malignant neoplasm of upper lobe, right bronchus or lung: Secondary | ICD-10-CM

## 2018-03-13 DIAGNOSIS — D649 Anemia, unspecified: Secondary | ICD-10-CM

## 2018-03-13 DIAGNOSIS — G893 Neoplasm related pain (acute) (chronic): Secondary | ICD-10-CM

## 2018-03-13 LAB — COMPREHENSIVE METABOLIC PANEL
ALT: 55 U/L (ref 17–63)
AST: 42 U/L — ABNORMAL HIGH (ref 15–41)
Albumin: 2.9 g/dL — ABNORMAL LOW (ref 3.5–5.0)
Alkaline Phosphatase: 164 U/L — ABNORMAL HIGH (ref 38–126)
Anion gap: 10 (ref 5–15)
BUN: 13 mg/dL (ref 6–20)
CO2: 25 mmol/L (ref 22–32)
CREATININE: 0.58 mg/dL — AB (ref 0.61–1.24)
Calcium: 10 mg/dL (ref 8.9–10.3)
Chloride: 99 mmol/L — ABNORMAL LOW (ref 101–111)
GFR calc non Af Amer: >60 mL/min (ref >60)
Glucose, Bld: 133 mg/dL — ABNORMAL HIGH (ref 65–99)
Potassium: 5 mmol/L (ref 3.5–5.1)
SODIUM: 134 mmol/L — AB (ref 135–145)
Total Bilirubin: 0.7 mg/dL (ref 0.3–1.2)
Total Protein: 8.3 g/dL — ABNORMAL HIGH (ref 6.5–8.1)

## 2018-03-13 LAB — CBC WITH DIFFERENTIAL/PLATELET
BASOS ABS: 0 10*3/uL (ref 0.0–0.1)
BASOS PCT: 0 %
EOS PCT: 1 %
Eosinophils Absolute: 0.1 10*3/uL (ref 0.0–0.7)
HCT: 31.6 % — ABNORMAL LOW (ref 39.0–52.0)
Hemoglobin: 10.4 g/dL — ABNORMAL LOW (ref 13.0–17.0)
Lymphocytes Relative: 8 %
Lymphs Abs: 0.7 10*3/uL (ref 0.7–4.0)
MCH: 29.6 pg (ref 26.0–34.0)
MCHC: 32.9 g/dL (ref 30.0–36.0)
MCV: 90 fL (ref 78.0–100.0)
Monocytes Absolute: 1.3 10*3/uL — ABNORMAL HIGH (ref 0.1–1.0)
Monocytes Relative: 14 %
Neutro Abs: 7.2 10*3/uL (ref 1.7–7.7)
Neutrophils Relative %: 77 %
Platelets: 499 10*3/uL — ABNORMAL HIGH (ref 150–400)
RBC: 3.51 MIL/uL — AB (ref 4.22–5.81)
RDW: 14.2 % (ref 11.5–15.5)
WBC: 9.4 10*3/uL (ref 4.0–10.5)

## 2018-03-13 MED ORDER — HEPARIN SOD (PORK) LOCK FLUSH 100 UNIT/ML IV SOLN
500.0000 [IU] | Freq: Once | INTRAVENOUS | Status: AC | PRN
  Administered 2018-03-13: 500 [IU]

## 2018-03-13 MED ORDER — DIPHENHYDRAMINE HCL 50 MG/ML IJ SOLN
INTRAMUSCULAR | Status: AC
  Filled 2018-03-13: qty 1

## 2018-03-13 MED ORDER — PACLITAXEL CHEMO INJECTION 300 MG/50ML
45.0000 mg/m2 | Freq: Once | INTRAVENOUS | Status: AC
  Administered 2018-03-13: 78 mg via INTRAVENOUS
  Filled 2018-03-13: qty 13

## 2018-03-13 MED ORDER — FAMOTIDINE IN NACL 20-0.9 MG/50ML-% IV SOLN
20.0000 mg | Freq: Once | INTRAVENOUS | Status: AC
  Administered 2018-03-13: 20 mg via INTRAVENOUS

## 2018-03-13 MED ORDER — OXYCODONE-ACETAMINOPHEN 5-325 MG PO TABS
ORAL_TABLET | ORAL | Status: AC
  Filled 2018-03-13: qty 1

## 2018-03-13 MED ORDER — FAMOTIDINE IN NACL 20-0.9 MG/50ML-% IV SOLN
INTRAVENOUS | Status: AC
  Filled 2018-03-13: qty 50

## 2018-03-13 MED ORDER — SODIUM CHLORIDE 0.9 % IV SOLN
Freq: Once | INTRAVENOUS | Status: AC
  Administered 2018-03-13: 12:00:00 via INTRAVENOUS

## 2018-03-13 MED ORDER — OXYCODONE-ACETAMINOPHEN 5-325 MG PO TABS
1.0000 | ORAL_TABLET | Freq: Once | ORAL | Status: AC
  Administered 2018-03-13: 1 via ORAL

## 2018-03-13 MED ORDER — DEXAMETHASONE SODIUM PHOSPHATE 100 MG/10ML IJ SOLN
20.0000 mg | Freq: Once | INTRAMUSCULAR | Status: AC
  Administered 2018-03-13: 20 mg via INTRAVENOUS
  Filled 2018-03-13: qty 2

## 2018-03-13 MED ORDER — PALONOSETRON HCL INJECTION 0.25 MG/5ML
0.2500 mg | Freq: Once | INTRAVENOUS | Status: AC
  Administered 2018-03-13: 0.25 mg via INTRAVENOUS

## 2018-03-13 MED ORDER — DIPHENHYDRAMINE HCL 50 MG/ML IJ SOLN
50.0000 mg | Freq: Once | INTRAMUSCULAR | Status: AC
  Administered 2018-03-13: 50 mg via INTRAVENOUS

## 2018-03-13 MED ORDER — PALONOSETRON HCL INJECTION 0.25 MG/5ML
INTRAVENOUS | Status: AC
  Filled 2018-03-13: qty 5

## 2018-03-13 MED ORDER — CARBOPLATIN CHEMO INJECTION 450 MG/45ML
225.8000 mg | Freq: Once | INTRAVENOUS | Status: AC
  Administered 2018-03-13: 230 mg via INTRAVENOUS
  Filled 2018-03-13: qty 23

## 2018-03-13 NOTE — Progress Notes (Signed)
Fred Hernandez, [place] [address]   CLINIC:  Medical Oncology/Hematology  PCP:  Vidal Schwalbe, MD [address] [place] [address] [phone]   REASON FOR VISIT:  Follow-up for squamous cell carcinoma of (R) lung   CURRENT THERAPY: Concurrent chemoXRT with weekly Carbo/Taxol   BRIEF ONCOLOGIC HISTORY:    Squamous cell carcinoma of right lung ([place])   12/13/2017 PET scan    IMPRESSION: 1. 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 There is early bony invasion of the right second rib and cortical destruction indicating local rib/chest wall invasion by tumor. 2. There is additional pleural thickening and pleural calcification along the right posterior pleural surface which is not separately hypermetabolic. 3. Small mediastinal lymph nodes are not appreciably hypermetabolic. 4. Multifocal and some diffuse regions of high activity in the bowel are likely physiologic. 5. No findings of metastatic disease to the neck, abdomen/pelvis, or to the included remainder of the skeleton. 6. Other imaging findings of potential clinical significance: Chronic right maxillary sinusitis. Aortic Atherosclerosis (ICD10-I70.0) and Emphysema (ICD10-J43.9). Chronic calcific pancreatitis.       01/15/2018 Initial Diagnosis    Squamous cell carcinoma of right lung ([place])      01/15/2018 Initial Biopsy    Lung needle core biopsy RUL: Squamous cell carcinoma      02/19/2018 Procedure    Port-a-cath placement by Dr. Arnoldo Morale       02/20/2018 -  Chemotherapy    The patient had palonosetron (ALOXI) injection 0.25 mg, 0.25 mg, Intravenous,  Once, 1 of 4 cycles Administration: 0.25 mg (02/20/2018) CARBOplatin (PARAPLATIN) 230 mg in sodium chloride 0.9 % 250 mL chemo infusion, 230 mg (100 % of original dose 225.8 mg), Intravenous,  Once, 1 of 4 cycles Dose modification:   (original  dose 225.8 mg, Cycle 1) PACLitaxel (TAXOL) 78 mg in dextrose 5 % 250 mL chemo infusion (</= 80mg /m2), 45 mg/m2 = 78 mg, Intravenous,  Once, 1 of 4 cycles Administration: 78 mg (02/20/2018)  for chemotherapy treatment.        Adenocarcinoma of prostate ([place])   10/24/2015 Tumor Marker    PSA 1.53      01/06/2016 Tumor Marker    PSA 1.6      07/31/2017 Tumor Marker    PSA 4.6      01/17/2018 Initial Diagnosis    Adenocarcinoma of prostate ([place])      01/17/2018 Procedure    12-core prostate biopsy revealed 6/12 cores positive for prostate adenocarcinoma (Dr[facility]).  (L) base lat: Gleason 9 (5+4), 20%. (L) mid lat: Gleason 9 (4+5), 40%. (L) apex lat: Gleason 9 (4+5), 20%. (L) base: Gleason 9 (5+4), 20%. (L) mid: Gleason 10 (5+5), 5%. (L) apex: Gleason 9 (5+4), 9%.            INTERVAL HISTORY:  Fred Hernandez 62 y.o. male returns for routine follow-up and consideration for next cycle of chemotherapy.   Here today with his wife and several family members.   Due for cycle #4 of Carbo/Taxol today.  He continues to have his radiation therapy daily in [place].  He feels like he is tolerating therapy "pretty good."  His biggest complaint is having no taste and no appetite.  He has been trying to work on his eating in the past week.  His weight is only down ~1 lb, which is much improved from his previous 7 lb  weight loss in 1 week.  He feels tired; energy levels 25%.  He spends much of his time sitting in a chair during the day.  Encouraged him to be more active as tolerated.   Reports "restless legs at night", which is new for him.  States that if he gets some cool air to his legs, the sensation improves.  Denies any peripheral neuropathy to his feet/toes or fingertips.    Continues to have some (R) scapular pain; he takes Percocet for this PRN which is helpful.  He is requesting a dose here today with his treatment; we will provide this for him.  His bowels are moving well. Denies  any constipation or diarrhea. No frank bleeding episodes that he is aware of.   Overall, he feels ready for next cycle of chemo today.     REVIEW OF SYSTEMS:  Review of Systems - Oncology Per HPI. Otherwise, 14 point ROS completed and negative except as stated above.    PAST MEDICAL/SURGICAL HISTORY:  Past Medical History:  Diagnosis Date  . Bursitis   . CAD (coronary artery disease)     s/p stent placement unknown artery 12/2014,  . Cancer [facility])    prostate cancer  . COPD (chronic obstructive pulmonary disease) ([place])   . Diabetes mellitus ([place])   . GERD (gastroesophageal reflux disease)   . History of prostate cancer   . Hyperlipemia   . Hypertension   . Peripheral arterial disease ([place])    a. h/o stents. b. 03/2016  - successful PTA and covered stenting using overlapping Viabahn covered stents of a long segment in-stent restenosis of previously placed nitinol self expanding stents in the proximal mid and distal right SFA.  . Tobacco abuse    Past Surgical History:  Procedure Laterality Date  . COLONOSCOPY WITH PROPOFOL N/A 11/22/2016   Procedure: COLONOSCOPY WITH PROPOFOL;  Surgeon: [name], MD;  Location: [facility]O [facility];  Service: Endoscopy;  Laterality: N/A;  10:00 am  . CORONARY STENT PLACEMENT  12-2014   [facility] [employer]  . FEMORAL ARTERY STENT Bilateral 03/2013  . PERIPHERAL VASCULAR CATHETERIZATION N/A 04/04/2016   Procedure: Lower Extremity Angiography;  Surgeon: Lorretta Harp, MD;  Location: [place] CV LAB;  Service: Cardiovascular;  Laterality: N/A;  . PERIPHERAL VASCULAR CATHETERIZATION N/A 04/04/2016   Procedure: Abdominal Aortogram;  Surgeon: Lorretta Harp, MD;  Location: [place] CV LAB;  Service: Cardiovascular;  Laterality: N/A;  . PERIPHERAL VASCULAR CATHETERIZATION  04/04/2016   Procedure: Peripheral Vascular Intervention;  Surgeon: Lorretta Harp, MD;  Location: [place] CV LAB;  Service: Cardiovascular;;  rt SFA stent  .  POLYPECTOMY  11/22/2016   Procedure: POLYPECTOMY;  Surgeon: [name], MD;  Location: [facility];  Service: Endoscopy;;  descending colon polyp, transverse colon polyp, sigmoid colon polyp, rectal polyp  . PORTACATH PLACEMENT Left 02/19/2018   Procedure: INSERTION PORT-A-CATH;  Surgeon: Aviva Signs, MD;  Location: [facility] ORS;  Service: General;  Laterality: Left;  . STENT PLACEMENT VASCULAR ([place] [facility])  2013     SOCIAL HISTORY:  Social History   Socioeconomic History  . Marital status: Single    Spouse name: Not on file  . Number of children: Not on file  . Years of education: Not on file  . Highest education level: Not on file  Occupational History  . Not on file  Social Needs  . Financial resource strain: Not on file  . Food insecurity:  Worry: Not on file    Inability: Not on file  . Transportation needs:    Medical: Not on file    Non-medical: Not on file  Tobacco Use  . Smoking status: Current Every Day Smoker    Packs/day: 0.25    Years: 45.00    Pack years: 11.25    Types: Cigarettes    Start date: 02/12/1971  . Smokeless tobacco: Never Used  Substance and Sexual Activity  . Alcohol use: Not Currently    Alcohol/week: 0.0 oz    Comment: stopped 2 weeks ago.  . Drug use: No  . Sexual activity: Yes  Lifestyle  . Physical activity:    Days per week: Not on file    Minutes per session: Not on file  . Stress: Not on file  Relationships  . Social connections:    Talks on phone: Not on file    Gets together: Not on file    Attends religious service: Not on file    Active member of club or organization: Not on file    Attends meetings of clubs or organizations: Not on file    Relationship status: Not on file  . Intimate partner violence:    Fear of current or ex partner: Not on file    Emotionally abused: Not on file    Physically abused: Not on file    Forced sexual activity: Not on file  Other Topics Concern  . Not on file  Social History Narrative  .  Not on file    FAMILY HISTORY:  Family History  Problem Relation Age of Onset  . Diabetes Mother   . Heart disease Mother   . Hypertension Mother   . Healthy Brother   . Other Brother        accident   . Arthritis Sister   . Arthritis Sister   . SIDS Brother   . Colon cancer Neg Hx     CURRENT MEDICATIONS:  Outpatient Encounter Medications as of 03/13/2018  Medication Sig Note  . aspirin 81 MG chewable tablet Chew 81 mg by mouth daily. 02/14/2018: Last dose 02/13/2018. On hold for surgery.  [name] atorvastatin (LIPITOR) 80 MG tablet Take 80 mg by mouth at bedtime.    [name] CARBOPLATIN IV Inject into the vein. 6 weekly treatments with radiation   . cilostazol (PLETAL) 50 MG tablet TAKE 1 TABLET(50 MG) BY MOUTH TWICE DAILY 02/14/2018: Last dose of pletal was 4/16. On hold for surgery.  . Coenzyme Q10 100 MG capsule Take 100 mg by mouth at bedtime.    . Diphenhyd-Hydrocort-Nystatin (FIRST-DUKES MOUTHWASH MT) Take by mouth.   . dronabinol (MARINOL) 5 MG capsule Take 1 capsule (5 mg total) by mouth 2 (two) times daily before a meal.   . FLOVENT HFA 110 MCG/ACT inhaler Inhale 1 puff into the lungs 2 (two) times daily.   . fluticasone (FLONASE) 50 MCG/ACT nasal spray Place 1 spray into both nostrils daily.   [name] ipratropium-albuterol (DUONEB) 0.5-2.5 (3) MG/3ML SOLN Take 3 mLs by nebulization every 6 (six) hours as needed (for shortness of breath).   . lidocaine-prilocaine (EMLA) cream Apply a quarter size amount to affected area 1 hour prior to coming to chemotherapy.  Do not rub in.  Cover with plastic wrap.   . metFORMIN (GLUCOPHAGE) 500 MG tablet Take 500 mg by mouth 2 (two) times daily.    . metoprolol succinate (TOPROL-XL) 50 MG 24 hr tablet Take 50 mg by mouth at bedtime.    [name]  nystatin (MYCOSTATIN) 100000 UNIT/ML suspension    . Omega-3 Fatty Acids (FISH OIL) 1000 MG CAPS Take 1 capsule by mouth 2 (two) times daily.    . ondansetron (ZOFRAN) 8 MG tablet Take 1 tablet (8 mg total) by mouth every  8 (eight) hours as needed for nausea or vomiting.   [name] oxyCODONE-acetaminophen (PERCOCET/ROXICET) 5-325 MG tablet Take 1 tablet by mouth every 6 (six) hours as needed for severe pain.   [name] PACLitaxel (TAXOL IV) Inject into the vein. 6 weekly treatments with radiation   . pantoprazole (PROTONIX) 40 MG tablet Take 40 mg by mouth 2 (two) times daily.   . prochlorperazine (COMPAZINE) 10 MG tablet Take 1 tablet (10 mg total) by mouth every 6 (six) hours as needed for nausea or vomiting.   . triamcinolone cream (KENALOG) 0.1 % Apply topically 2 (two) times daily.   . [EXPIRED] 0.9 %  sodium chloride infusion    . [EXPIRED] CARBOplatin (PARAPLATIN) 230 mg in sodium chloride 0.9 % 250 mL chemo infusion    . [EXPIRED] dexamethasone (DECADRON) 20 mg in sodium chloride 0.9 % 50 mL IVPB    . [EXPIRED] diphenhydrAMINE (BENADRYL) injection 50 mg    . [EXPIRED] famotidine (PEPCID) IVPB 20 mg premix    . [EXPIRED] heparin lock flush 100 unit/mL    . [EXPIRED] oxyCODONE-acetaminophen (PERCOCET/ROXICET) 5-325 MG per tablet 1 tablet    . [EXPIRED] PACLitaxel (TAXOL) 78 mg in dextrose 5 % 250 mL chemo infusion (</= 80mg /m2)    . [EXPIRED] palonosetron (ALOXI) injection 0.25 mg     No facility-administered encounter medications on file as of 03/13/2018.     ALLERGIES:  Allergies  Allergen Reactions  . Lisinopril Anaphylaxis  . Penicillins Anaphylaxis    Has patient had a PCN reaction causing immediate rash, facial/tongue/throat swelling, SOB or lightheadedness with hypotension:Yes Has patient had a PCN reaction causing severe rash involving mucus membranes or skin necrosis:unsure Has patient had a PCN reaction that required hospitalization:unsure Has patient had a PCN reaction occurring within the last 10 years:~10 years per patient If all of the above answers are "NO", then may proceed with Cephalosporin use.   . Ace Inhibitors Swelling     PHYSICAL EXAM:  ECOG Performance status: 1-2 - Symptomatic; may  require occasional assistance   BP 113/79 Pulse 125 Resp 18 Temp 97.9 O2 sat 100%  Weight 130.6 lbs   Physical Exam  Constitutional: He is oriented to person, place, and time.  Seen in chemo bed in infusion area  -Chronically-ill appearing thin male in no acute distress  HENT:  Head: Normocephalic.  Mouth/Throat: Oropharynx is clear and moist.  Eyes: Conjunctivae are normal. No scleral icterus.  Neck: Normal range of motion. Neck supple.  Cardiovascular: Regular rhythm. Exam reveals no friction rub.  No murmur heard. Tachycardic  Pulmonary/Chest: Effort normal. No respiratory distress.  Diminished breath sounds bilat bases   Abdominal: Soft. Bowel sounds are normal. There is no tenderness.  Musculoskeletal: Normal range of motion. He exhibits no edema.  Lymphadenopathy:    He has no cervical adenopathy.       Right: No supraclavicular adenopathy present.       Left: No supraclavicular adenopathy present.  Neurological: He is alert and oriented to person, place, and time. No cranial nerve deficit.  Skin: Skin is warm and dry. No rash noted.  Psychiatric: He has a normal mood and affect. Judgment normal.  Nursing note and vitals reviewed.    LABORATORY DATA:  I  have reviewed the labs as listed.  CBC    Component Value Date/Time   WBC 9.4 03/13/2018 0958   RBC 3.51 (L) 03/13/2018 0958   HGB 10.4 (L) 03/13/2018 0958   HCT 31.6 (L) 03/13/2018 0958   PLT 499 (H) 03/13/2018 0958   MCV 90.0 03/13/2018 0958   MCH 29.6 03/13/2018 0958   MCHC 32.9 03/13/2018 0958   RDW 14.2 03/13/2018 0958   LYMPHSABS 0.7 03/13/2018 0958   MONOABS 1.3 (H) 03/13/2018 0958   EOSABS 0.1 03/13/2018 0958   BASOSABS 0.0 03/13/2018 0958   CMP Latest Ref Rng & Units 03/13/2018 03/06/2018 02/27/2018  Glucose 65 - 99 mg/dL 133(H) 125(H) 131(H)  BUN 6 - 20 mg/dL 13 14 11   Creatinine 0.61 - 1.24 mg/dL 0.58(L) 0.66 0.65  Sodium 135 - 145 mmol/L 134(L) 136 138  Potassium 3.5 - 5.1 mmol/L 5.0 4.3 4.2    Chloride 101 - 111 mmol/L 99(L) 98(L) 100(L)  CO2 22 - 32 mmol/L 25 24 23   Calcium 8.9 - 10.3 mg/dL 10.0 10.7(H) 11.3(H)  Total Protein 6.5 - 8.1 g/dL 8.3(H) 8.6(H) 8.3(H)  Total Bilirubin 0.3 - 1.2 mg/dL 0.7 1.2 1.1  Alkaline Phos 38 - 126 U/L 164(H) 193(H) 122  AST 15 - 41 U/L 42(H) 87(H) 40  ALT 17 - 63 U/L 55 103(H) 34    PENDING LABS:    DIAGNOSTIC IMAGING:    PATHOLOGY:     ASSESSMENT & PLAN:   Squamous cell carcinoma of (R) lung:  -Undergoing concurrent chemoXRT with weekly Carbo/Taxol.  -Due for cycle #4 Carbo/Taxol today.  Labs reviewed and are adequate for treatment.  Alkaline phosphatase and AST remain mildly elevated, but improved from 1 week ago.  Kidney function looks good.  He continues to have some anemia with Hgb 10.4 g/dL today. No active bleeding episodes.  He also continues to have some thrombocytosis, which is likely reactive.  Plt count better today at 499,000.   -Endorses some "restless legs" at bedtime only, which is self-limiting. This may be early sign of peripheral neuropathy and we will keep monitoring closely. Otherwise, he denies any neuropathy symptoms to his hands or feet. -Continue radiation as directed -Return to cancer center in 1 week for follow-up with Dr. Delton Coombes and consideration for next cycle of chemotherapy.   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  -Managed by urology; continue follow-up with their team as directed.   Protein-calorie malnutrition:  -Weight loss has stabilized. He only lost ~1 lb in the past week. Commended his efforts and encouraged him to keep up the good work.  Our registered dietitians are continuing to follow him.   -Albumin very low at 2.9 today.  He has tried Marinol in recent past, but he did not like the way it made him feel.  -Recommended continued supplementation with Ensure/Boost and high-calorie/high-protein foods as tolerated.          Dispo:   -Return to cancer center in 1 week for follow-up with Dr. Delton Coombes and consideration for next cycle of chemotherapy.    All questions were answered to patient's stated satisfaction. Encouraged patient to call with any new concerns or questions before his next visit to the cancer center and we can certain see him sooner, if needed.      Orders placed this encounter:  No orders of the defined types were placed in this encounter.     Mike Craze, NP [place] [phone]

## 2018-03-13 NOTE — Progress Notes (Signed)
Tolerated infusions w/o adverse reaction.  Alert, in no distress.  VSS.  Discharged ambulatory in c/o family.

## 2018-03-13 NOTE — Progress Notes (Signed)
Nutrition Follow-up 62 y/o male PMHx tobacco abuse, COPD, PAD, DM2, HLD/HTN, CAD, GERD, Prostate Cancer. CT performed 1/21 d/t  chest pain revealed likely lung cancer, w/ subsequent biopsy confirmation. Currently undergoing chemoradiation.   Fortunately, patient is nearly the same weight as last week, with only 1 lb decrease in weight. He still has lost 15 lbs in 4 weeks.   Patient/spouse report that since seen last week, the patient has been eating "every 30 minutes". He has been drinking many Ensure supplements, as many as 8 a day, though it is usually 3-4. The items he has been eating include: PB crackers, big mac, pinto beans, homemade biscuits.   Patient still has no n/v/c/d, but spouse reports acute onset of dysphagia. She says they were just given a new prescription for a medication named diflucan.       Wt Readings from Last 10 Encounters:  03/13/18 130 lb 9.6 oz (59.2 kg)  03/06/18 131 lb 12.8 oz (59.8 kg)  02/27/18 138 lb 8 oz (62.8 kg)  02/20/18 143 lb (64.9 kg)  02/19/18 145 lb (65.8 kg)  02/14/18 145 lb (65.8 kg)  02/13/18 145 lb (65.8 kg)  01/25/18 147 lb 6.4 oz (66.9 kg)  01/15/18 155 lb (70.3 kg)  01/03/18 156 lb (70.8 kg)   NUTRITION - FOCUSED PHYSICAL EXAM: Severe muscle/fat wasting  MEDICATIONS: Chemo: Carboplatin, taxol.  Supportive meds: Compazine, Omega 3s, perocet, zofran, ppi, nystatin Other: metformin  LABS:  Albumin: 2.9 (down) Glu: 133, improved LFTs, Renal labs stable and WDL  Recent Labs  Lab 03/13/18 0958  NA 134*  K 5.0  CL 99*  CO2 25  BUN 13  CREATININE 0.58*  CALCIUM 10.0  GLUCOSE 133*    ANTHROPOMETRICS: Height:  5' 8"  (172.72 cm) Weight: 131 lbs 12.8 oz (59.2 kg) BMI: 19.8-healthy wt for ht UBW:   167 lbs  IBW:    154 lbs (70 kg)              ESTIMATED ENERGY NEEDS:  Kcal:    ~2100 kcals (35  kcal/kg bw)  Protein: ~90 g Pro (1.5 g/kg bw) Fluid:   >2.1 L (35 ml/kg bw)  NUTRITION DIAGNOSIS:  Severe malnutrition related to  cancer and cancer related treatments AEB loss of 16 lbs (>10%) in just 3 wks.   DOCUMENTATION CODES:  Severe malnutrition; acute context  INTERVENTION:  RD provided positive feedback on the majority of the foods choices the patient is choosing; the majority of them are high in protein. Recommended eating the biscuits with gravy/jelly/butter etc.   He was started on Nystatin due to suspected thrush  He is still relying very much on Ensure. He is provided with his 3rd and final case of Ensure today.   Reiterated importance of frequent intake of pro/kcal dense items and to continue to "ride this wave".   Patient has no concerns today.   RD will continue to monitor and briefly f/u again next week.    GOAL:  Oral intake to meet >90% needs, wt stability  MET  MONITOR:  Intake, weight, chemotherapy tolerance, goals of care  Next Visit:  1-2 Weeks. Will follow and f/u as indicated. Do not feel RD has any further appropriate interventions to offer other than just reinforce prior education.   Burtis Junes RD, LDN, CNSC Clinical Nutrition Available Tues-Sat via Pager: [phone] 03/13/2018 2:06 PM

## 2018-03-14 ENCOUNTER — Ambulatory Visit: Payer: Medicare Other | Admitting: Cardiovascular Disease

## 2018-03-20 ENCOUNTER — Encounter (HOSPITAL_COMMUNITY): Payer: Self-pay | Admitting: General Practice

## 2018-03-20 ENCOUNTER — Encounter (HOSPITAL_COMMUNITY): Payer: Self-pay | Admitting: Adult Health

## 2018-03-20 ENCOUNTER — Inpatient Hospital Stay (HOSPITAL_COMMUNITY): Payer: Medicare Other

## 2018-03-20 ENCOUNTER — Inpatient Hospital Stay (HOSPITAL_COMMUNITY): Payer: Medicare Other | Admitting: Dietician

## 2018-03-20 ENCOUNTER — Encounter (HOSPITAL_COMMUNITY): Payer: Self-pay

## 2018-03-20 ENCOUNTER — Ambulatory Visit: Payer: Medicare Other | Admitting: Orthopaedic Surgery

## 2018-03-20 ENCOUNTER — Inpatient Hospital Stay (HOSPITAL_BASED_OUTPATIENT_CLINIC_OR_DEPARTMENT_OTHER): Payer: Medicare Other | Admitting: Adult Health

## 2018-03-20 VITALS — BP 114/65 | HR 100 | Temp 98.7°F | Resp 18 | Wt 130.6 lb

## 2018-03-20 VITALS — BP 128/72 | HR 104 | Temp 98.9°F | Resp 18

## 2018-03-20 DIAGNOSIS — G893 Neoplasm related pain (acute) (chronic): Secondary | ICD-10-CM | POA: Diagnosis not present

## 2018-03-20 DIAGNOSIS — F17210 Nicotine dependence, cigarettes, uncomplicated: Secondary | ICD-10-CM | POA: Diagnosis not present

## 2018-03-20 DIAGNOSIS — R300 Dysuria: Secondary | ICD-10-CM

## 2018-03-20 DIAGNOSIS — C3411 Malignant neoplasm of upper lobe, right bronchus or lung: Secondary | ICD-10-CM | POA: Diagnosis not present

## 2018-03-20 DIAGNOSIS — C3491 Malignant neoplasm of unspecified part of right bronchus or lung: Secondary | ICD-10-CM

## 2018-03-20 DIAGNOSIS — C61 Malignant neoplasm of prostate: Secondary | ICD-10-CM | POA: Diagnosis not present

## 2018-03-20 DIAGNOSIS — E43 Unspecified severe protein-calorie malnutrition: Secondary | ICD-10-CM

## 2018-03-20 DIAGNOSIS — R634 Abnormal weight loss: Secondary | ICD-10-CM | POA: Diagnosis not present

## 2018-03-20 DIAGNOSIS — Z5111 Encounter for antineoplastic chemotherapy: Secondary | ICD-10-CM

## 2018-03-20 LAB — COMPREHENSIVE METABOLIC PANEL
ALT: 29 U/L (ref 17–63)
ANION GAP: 12 (ref 5–15)
AST: 19 U/L (ref 15–41)
Albumin: 2.9 g/dL — ABNORMAL LOW (ref 3.5–5.0)
Alkaline Phosphatase: 122 U/L (ref 38–126)
BUN: 13 mg/dL (ref 6–20)
CO2: 23 mmol/L (ref 22–32)
Calcium: 9.5 mg/dL (ref 8.9–10.3)
Chloride: 98 mmol/L — ABNORMAL LOW (ref 101–111)
Creatinine, Ser: 0.54 mg/dL — ABNORMAL LOW (ref 0.61–1.24)
Glucose, Bld: 131 mg/dL — ABNORMAL HIGH (ref 65–99)
POTASSIUM: 4.2 mmol/L (ref 3.5–5.1)
Sodium: 133 mmol/L — ABNORMAL LOW (ref 135–145)
Total Bilirubin: 0.6 mg/dL (ref 0.3–1.2)
Total Protein: 7.9 g/dL (ref 6.5–8.1)

## 2018-03-20 LAB — CBC WITH DIFFERENTIAL/PLATELET
BASOS ABS: 0 10*3/uL (ref 0.0–0.1)
Basophils Relative: 0 %
EOS PCT: 1 %
Eosinophils Absolute: 0.1 10*3/uL (ref 0.0–0.7)
HCT: 31.6 % — ABNORMAL LOW (ref 39.0–52.0)
Hemoglobin: 10.2 g/dL — ABNORMAL LOW (ref 13.0–17.0)
LYMPHS ABS: 0.5 10*3/uL — AB (ref 0.7–4.0)
LYMPHS PCT: 5 %
MCH: 28.7 pg (ref 26.0–34.0)
MCHC: 32.3 g/dL (ref 30.0–36.0)
MCV: 88.8 fL (ref 78.0–100.0)
MONO ABS: 0.9 10*3/uL (ref 0.1–1.0)
Monocytes Relative: 10 %
Neutro Abs: 7.1 10*3/uL (ref 1.7–7.7)
Neutrophils Relative %: 84 %
Platelets: 336 10*3/uL (ref 150–400)
RBC: 3.56 MIL/uL — ABNORMAL LOW (ref 4.22–5.81)
RDW: 14.5 % (ref 11.5–15.5)
WBC: 8.6 10*3/uL (ref 4.0–10.5)

## 2018-03-20 MED ORDER — SODIUM CHLORIDE 0.9% FLUSH
10.0000 mL | INTRAVENOUS | Status: DC | PRN
  Administered 2018-03-20: 10 mL
  Filled 2018-03-20: qty 10

## 2018-03-20 MED ORDER — DIPHENHYDRAMINE HCL 50 MG/ML IJ SOLN
50.0000 mg | Freq: Once | INTRAMUSCULAR | Status: AC
  Administered 2018-03-20: 50 mg via INTRAVENOUS

## 2018-03-20 MED ORDER — HEPARIN SOD (PORK) LOCK FLUSH 100 UNIT/ML IV SOLN
INTRAVENOUS | Status: AC
  Filled 2018-03-20: qty 5

## 2018-03-20 MED ORDER — SODIUM CHLORIDE 0.9 % IV SOLN
Freq: Once | INTRAVENOUS | Status: AC
  Administered 2018-03-20: 12:00:00 via INTRAVENOUS

## 2018-03-20 MED ORDER — OXYCODONE-ACETAMINOPHEN 5-325 MG PO TABS: 1.0000 | ORAL_TABLET | Freq: Four times a day (QID) | ORAL | 0 refills | Status: DC | PRN

## 2018-03-20 MED ORDER — SODIUM CHLORIDE 0.9 % IV SOLN
225.8000 mg | Freq: Once | INTRAVENOUS | Status: AC
  Administered 2018-03-20: 230 mg via INTRAVENOUS
  Filled 2018-03-20: qty 23

## 2018-03-20 MED ORDER — HEPARIN SOD (PORK) LOCK FLUSH 100 UNIT/ML IV SOLN
500.0000 [IU] | Freq: Once | INTRAVENOUS | Status: AC | PRN
  Administered 2018-03-20: 500 [IU]
  Filled 2018-03-20: qty 5

## 2018-03-20 MED ORDER — FAMOTIDINE IN NACL 20-0.9 MG/50ML-% IV SOLN
20.0000 mg | Freq: Once | INTRAVENOUS | Status: AC
  Administered 2018-03-20: 20 mg via INTRAVENOUS

## 2018-03-20 MED ORDER — SODIUM CHLORIDE 0.9 % IV SOLN
45.0000 mg/m2 | Freq: Once | INTRAVENOUS | Status: AC
  Administered 2018-03-20: 78 mg via INTRAVENOUS
  Filled 2018-03-20: qty 13

## 2018-03-20 MED ORDER — FAMOTIDINE IN NACL 20-0.9 MG/50ML-% IV SOLN
INTRAVENOUS | Status: AC
  Filled 2018-03-20: qty 50

## 2018-03-20 MED ORDER — PALONOSETRON HCL INJECTION 0.25 MG/5ML
0.2500 mg | Freq: Once | INTRAVENOUS | Status: AC
  Administered 2018-03-20: 0.25 mg via INTRAVENOUS

## 2018-03-20 MED ORDER — DIPHENHYDRAMINE HCL 50 MG/ML IJ SOLN
INTRAMUSCULAR | Status: AC
  Filled 2018-03-20: qty 1

## 2018-03-20 MED ORDER — SODIUM CHLORIDE 0.9 % IV SOLN
20.0000 mg | Freq: Once | INTRAVENOUS | Status: AC
  Administered 2018-03-20: 20 mg via INTRAVENOUS
  Filled 2018-03-20: qty 2

## 2018-03-20 MED ORDER — PALONOSETRON HCL INJECTION 0.25 MG/5ML
INTRAVENOUS | Status: AC
  Filled 2018-03-20: qty 5

## 2018-03-20 NOTE — Progress Notes (Signed)
Nutrition Follow-up  62 y/o male PMHx tobacco abuse, COPD, PAD, DM2, HLD/HTN, CAD, GERD, Prostate Cancer. CT performed 1/21 d/t  chest pain revealed likely lung cancer, w/ subsequent biopsy confirmation. Referred to RD due to poor appetite and weight loss prior to undergoing chemoradiation.   RD following up again with high risk patient w/ poor appetite/drastic weight loss.   Fortunately, he has maintained his week another week.   Spoke with pt and spouse. Pt tends to eat well Tuesday/Wednesday, due to steroid effects, but on Thurs-Mon, he eats poorly. Spouse says this is because "he cant decide what he wants" or, in other words, there is nothing he wants to eat.   He continues to tolerate chemoradiation exceptionally well. He has rare occurrences of nausea, but this is managed with supportive meds. No vomiting, constipation or diarrhea. He will also occasionally have pain in esophagus, but this is also rare. His major symptom/side effect is still lack of appetite, though Spouse does report slight improvement in his appetite.  He does also complains of dysuria which began Friday. He is to give a urine specimen today for possible UTI.   He is heavily relying on Ensure, consuming 6-8 a day.   Wt Readings from Last 10 Encounters:  03/13/18 130 lb 9.6 oz (59.2 kg)  03/06/18 131 lb 12.8 oz (59.8 kg)  02/27/18 138 lb 8 oz (62.8 kg)  02/20/18 143 lb (64.9 kg)  02/19/18 145 lb (65.8 kg)  02/14/18 145 lb (65.8 kg)  02/13/18 145 lb (65.8 kg)  01/25/18 147 lb 6.4 oz (66.9 kg)  01/15/18 155 lb (70.3 kg)  01/03/18 156 lb (70.8 kg)   MEDICATIONS:  Chemo: Carboplatin, taxol.  Supportive meds: Compazine, Omega 3s, perocet, zofran, ppi, nystatin Other: metformin  LABS:  Reviewed. Albumin stable, renal labs stable, lytes stable. Glucose stable.   Recent Labs  Lab 03/20/18 0922  NA 133*  K 4.2  CL 98*  CO2 23  BUN 13  CREATININE 0.54*  CALCIUM 9.5  GLUCOSE 131*    ANTHROPOMETRICS: Height:5' 8"  (172.72 cm) Weight:131 lbs 12.8 oz (59.2 kg) BMI:19.8-healthy wt for ht UBW:167 lbs IBW:154 lbs (70 kg) Wt change-exact same wt as last week  ESTIMATED ENERGY NEEDS: Kcal:~2100 kcals (35  kcal/kg bw)  Protein:~90 g Pro (1.5 g/kg bw) Fluid:>2.1 L (35 ml/kg bw)  NUTRITION DIAGNOSIS:  Severe malnutrition related to cancer and cancer related treatments AEB loss of 16 lbs (>10%) in just 4 wks.   DOCUMENTATION CODES:  Severe malnutrition; acute on chronic context  INTERVENTION:  Firstly, commended patient on his wt maintenance. He is appropriately smiling. Gave much encouragement to continue eating well. RD encouraged to continue to try to eat 1-2 more bites after he feels full.   RD noted he had multiple open cokes at bedside. RD commented that sugary beverages can caffeinated and high sugary beverages can make UTIs worse. He is recommended to consume mostly water. His spouse does state he has been drinking more water and they have been adding flavoring to it.   Patient is still heavily relying on Ensure. Apparently he is drinking 7-8/day (3 in morning alone) and is still actively receiving chemo and radiation. Though the patient has received the normal allotment of ensure cases, it is this RD's clinical opinion that it is justifiable to provide another case of Ensure.   RD notified the patient spouse that this will likely be the last case that center will provide. RD gave plethora of coupons for supplements,  specifically noting the $50 dollars in coupons that can be received by filling out info on [employer] site.   Additionally, RD presented handout titled "Making the Most of Each Bite" and specifically highlighted reviewed the homemade Ensure Recipe and how this provides nearly identical amounts of pro/kcal as a regular ensure.   GOAL:  Oral intake to meet >90% needs, wt stability  MET  MONITOR:  Intake, weight,  chemotherapy tolerance, goals of care  Next Visit:  1 week-next wk last chemo.   Burtis Junes RD, LDN, CNSC Clinical Nutrition Available Tues-Sat via Pager: [phone] 03/20/2018 11:08 AM

## 2018-03-20 NOTE — Progress Notes (Signed)
[facility] CSW Progress Note  Brief visit w patient in infusion room to assess for needs/resources.  Patient accompanied by support person who requested information on housing resources.  List given.  No needs at this time, CSW encouraged participation in support group as way to help w resources/needs re cancer.  Edwyna Shell, LCSW Clinical Social Worker Phone:  [phone]

## 2018-03-20 NOTE — Patient Instructions (Signed)
[facility] Discharge Instructions for Patients Receiving Chemotherapy  Today you received the following chemotherapy agents taxol and carboplatin   If you develop nausea and vomiting that is not controlled by your nausea medication, call the clinic.   BELOW ARE SYMPTOMS THAT SHOULD BE REPORTED IMMEDIATELY:  *FEVER GREATER THAN 100.5 F  *CHILLS WITH OR WITHOUT FEVER  NAUSEA AND VOMITING THAT IS NOT CONTROLLED WITH YOUR NAUSEA MEDICATION  *UNUSUAL SHORTNESS OF BREATH  *UNUSUAL BRUISING OR BLEEDING  TENDERNESS IN MOUTH AND THROAT WITH OR WITHOUT PRESENCE OF ULCERS  *URINARY PROBLEMS  *BOWEL PROBLEMS  UNUSUAL RASH Items with * indicate a potential emergency and should be followed up as soon as possible.  Feel free to call the clinic should you have any questions or concerns. The clinic phone number is [phone].  Please show the [facility] at check-in to the Emergency Department and triage nurse.

## 2018-03-20 NOTE — Progress Notes (Signed)
[place],  [id number]   CLINIC:  Medical Oncology/Hematology  PCP:  Vidal Schwalbe, MD [address] [place] [address] [phone]   REASON FOR VISIT:  Follow-up for squamous cell carcinoma of (R) lung   CURRENT THERAPY: Concurrent chemoXRT with weekly Carbo/Taxol   BRIEF ONCOLOGIC HISTORY:    Squamous cell carcinoma of right lung ([facility])   12/13/2017 PET scan    IMPRESSION: 1. 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 There is early bony invasion of the right second rib and cortical destruction indicating local rib/chest wall invasion by tumor. 2. There is additional pleural thickening and pleural calcification along the right posterior pleural surface which is not separately hypermetabolic. 3. Small mediastinal lymph nodes are not appreciably hypermetabolic. 4. Multifocal and some diffuse regions of high activity in the bowel are likely physiologic. 5. No findings of metastatic disease to the neck, abdomen/pelvis, or to the included remainder of the skeleton. 6. Other imaging findings of potential clinical significance: Chronic right maxillary sinusitis. Aortic Atherosclerosis (ICD10-I70.0) and Emphysema (ICD10-J43.9). Chronic calcific pancreatitis.       01/15/2018 Initial Diagnosis    Squamous cell carcinoma of right lung ([facility])      01/15/2018 Initial Biopsy    Lung needle core biopsy RUL: Squamous cell carcinoma      02/19/2018 Procedure    Port-a-cath placement by Dr. Arnoldo Morale       02/20/2018 -  Chemotherapy    The patient had palonosetron (ALOXI) injection 0.25 mg, 0.25 mg, Intravenous,  Once, 1 of 4 cycles Administration: 0.25 mg (02/20/2018) CARBOplatin (PARAPLATIN) 230 mg in sodium chloride 0.9 % 250 mL chemo infusion, 230 mg (100 % of original dose 225.8 mg), Intravenous,  Once, 1 of 4 cycles Dose modification:   (original  dose 225.8 mg, Cycle 1) PACLitaxel (TAXOL) 78 mg in dextrose 5 % 250 mL chemo infusion (</= 80mg /m2), 45 mg/m2 = 78 mg, Intravenous,  Once, 1 of 4 cycles Administration: 78 mg (02/20/2018)  for chemotherapy treatment.        Adenocarcinoma of prostate ([facility])   10/24/2015 Tumor Marker    PSA 1.53      01/06/2016 Tumor Marker    PSA 1.6      07/31/2017 Tumor Marker    PSA 4.6      01/17/2018 Initial Diagnosis    Adenocarcinoma of prostate ([facility])      01/17/2018 Procedure    12-core prostate biopsy revealed 6/12 cores positive for prostate adenocarcinoma (Dr[facility]).  (L) base lat: Gleason 9 (5+4), 20%. (L) mid lat: Gleason 9 (4+5), 40%. (L) apex lat: Gleason 9 (4+5), 20%. (L) base: Gleason 9 (5+4), 20%. (L) mid: Gleason 10 (5+5), 5%. (L) apex: Gleason 9 (5+4), 9%.            INTERVAL HISTORY:  Mr. Fred Hernandez 62 y.o. male returns for routine follow-up and consideration for next cycle of chemotherapy.   Here today with his wife.  Due for cycle #5 of Carbo/Taxol today.  He continues to have his radiation therapy daily in [place].    Overall, he feels like he is "doing okay."  He tells me that he has been working hard on eating and drinking nutritional supplements.  He is drinking 6-7 Ensures per day.  He has noticed a little bit of improvement in his fatigue, but reports that he "still has no energy."  Does  report some numbness to his fingertips in his feet, "but it does not stay long."  His weight today is stable at 130.6 lbs.  Last week he weighed 130.6 lbs.  Continues to endorse right upper back/shoulder pain.  Percocet is helpful in managing this pain.  He states that "on a bad day" he requires 3-4 Percocet per day.  Generally, he may require one Percocet per day "but only take it when I needed."  His bowels are moving regularly.  He states that he takes stool softeners as needed.  He is requesting refill of Percocet today.  Reports new burning with urination, that  started last Friday.  Denies any frank blood in his urine that he is able to see.  States "it burns at the end of my pee."  Denies any fever or chills.  Otherwise, he is largely without other complaints and feels well enough for his next cycle of chemotherapy today.     REVIEW OF SYSTEMS:  Review of Systems - Oncology Per HPI. Otherwise, 14 point ROS completed and negative except as stated above.    PAST MEDICAL/SURGICAL HISTORY:  Past Medical History:  Diagnosis Date  . Bursitis   . CAD (coronary artery disease)     s/p stent placement unknown artery 12/2014,  . Cancer [facility])    prostate cancer  . COPD (chronic obstructive pulmonary disease) ([facility])   . Diabetes mellitus ([facility])   . GERD (gastroesophageal reflux disease)   . History of prostate cancer   . Hyperlipemia   . Hypertension   . Peripheral arterial disease ([facility])    a. h/o stents. b. 03/2016  - successful PTA and covered stenting using overlapping Viabahn covered stents of a long segment in-stent restenosis of previously placed nitinol self expanding stents in the proximal mid and distal right SFA.  . Tobacco abuse    Past Surgical History:  Procedure Laterality Date  . COLONOSCOPY WITH PROPOFOL N/A 11/22/2016   Procedure: COLONOSCOPY WITH PROPOFOL;  Surgeon: [name], MD;  Location: [facility] ENDO SUITE;  Service: Endoscopy;  Laterality: N/A;  10:00 am  . CORONARY STENT PLACEMENT  12-2014   [facility] [employer]  . FEMORAL ARTERY STENT Bilateral 03/2013  . PERIPHERAL VASCULAR CATHETERIZATION N/A 04/04/2016   Procedure: Lower Extremity Angiography;  Surgeon: Lorretta Harp, MD;  Location: [facility] CV LAB;  Service: Cardiovascular;  Laterality: N/A;  . PERIPHERAL VASCULAR CATHETERIZATION N/A 04/04/2016   Procedure: Abdominal Aortogram;  Surgeon: Lorretta Harp, MD;  Location: [facility] CV LAB;  Service: Cardiovascular;  Laterality: N/A;  . PERIPHERAL VASCULAR CATHETERIZATION  04/04/2016   Procedure: Peripheral  Vascular Intervention;  Surgeon: Lorretta Harp, MD;  Location: [facility] CV LAB;  Service: Cardiovascular;;  rt SFA stent  . POLYPECTOMY  11/22/2016   Procedure: POLYPECTOMY;  Surgeon: [name], MD;  Location: [facility];  Service: Endoscopy;;  descending colon polyp, transverse colon polyp, sigmoid colon polyp, rectal polyp  . PORTACATH PLACEMENT Left 02/19/2018   Procedure: INSERTION PORT-A-CATH;  Surgeon: Aviva Signs, MD;  Location: [facility] ORS;  Service: General;  Laterality: Left;  . STENT PLACEMENT VASCULAR ([facility])  2013     SOCIAL HISTORY:  Social History   Socioeconomic History  . Marital status: Single    Spouse name: Not on file  . Number of children: Not on file  . Years of education: Not on file  . Highest education level: Not on file  Occupational History  . Not  on file  Social Needs  . Financial resource strain: Not on file  . Food insecurity:    Worry: Not on file    Inability: Not on file  . Transportation needs:    Medical: Not on file    Non-medical: Not on file  Tobacco Use  . Smoking status: Current Every Day Smoker    Packs/day: 0.25    Years: 45.00    Pack years: 11.25    Types: Cigarettes    Start date: 02/12/1971  . Smokeless tobacco: Never Used  Substance and Sexual Activity  . Alcohol use: Not Currently    Alcohol/week: 0.0 oz    Comment: stopped 2 weeks ago.  . Drug use: No  . Sexual activity: Yes  Lifestyle  . Physical activity:    Days per week: Not on file    Minutes per session: Not on file  . Stress: Not on file  Relationships  . Social connections:    Talks on phone: Not on file    Gets together: Not on file    Attends religious service: Not on file    Active member of club or organization: Not on file    Attends meetings of clubs or organizations: Not on file    Relationship status: Not on file  . Intimate partner violence:    Fear of current or ex partner: Not on file    Emotionally abused: Not on file     Physically abused: Not on file    Forced sexual activity: Not on file  Other Topics Concern  . Not on file  Social History Narrative  . Not on file    FAMILY HISTORY:  Family History  Problem Relation Age of Onset  . Diabetes Mother   . Heart disease Mother   . Hypertension Mother   . Healthy Brother   . Other Brother        accident   . Arthritis Sister   . Arthritis Sister   . SIDS Brother   . Colon cancer Neg Hx     CURRENT MEDICATIONS:  Outpatient Encounter Medications as of 03/20/2018  Medication Sig Note  . aspirin 81 MG chewable tablet Chew 81 mg by mouth daily. 02/14/2018: Last dose 02/13/2018. On hold for surgery.  [name] atorvastatin (LIPITOR) 80 MG tablet Take 80 mg by mouth at bedtime.    [name] CARBOPLATIN IV Inject into the vein. 6 weekly treatments with radiation   . cilostazol (PLETAL) 50 MG tablet TAKE 1 TABLET(50 MG) BY MOUTH TWICE DAILY 02/14/2018: Last dose of pletal was 4/16. On hold for surgery.  . Coenzyme Q10 100 MG capsule Take 100 mg by mouth at bedtime.    . Diphenhyd-Hydrocort-Nystatin (FIRST-DUKES MOUTHWASH MT) Take by mouth.   . dronabinol (MARINOL) 5 MG capsule Take 1 capsule (5 mg total) by mouth 2 (two) times daily before a meal.   . FLOVENT HFA 110 MCG/ACT inhaler Inhale 1 puff into the lungs 2 (two) times daily.   . fluticasone (FLONASE) 50 MCG/ACT nasal spray Place 1 spray into both nostrils daily.   [name] ipratropium-albuterol (DUONEB) 0.5-2.5 (3) MG/3ML SOLN Take 3 mLs by nebulization every 6 (six) hours as needed (for shortness of breath).   . lidocaine-prilocaine (EMLA) cream Apply a quarter size amount to affected area 1 hour prior to coming to chemotherapy.  Do not rub in.  Cover with plastic wrap.   . metFORMIN (GLUCOPHAGE) 500 MG tablet Take 500 mg by mouth 2 (two) times daily.    [name]  metoprolol succinate (TOPROL-XL) 50 MG 24 hr tablet Take 50 mg by mouth at bedtime.    [name] nystatin (MYCOSTATIN) 100000 UNIT/ML suspension    . Omega-3 Fatty Acids (FISH  OIL) 1000 MG CAPS Take 1 capsule by mouth 2 (two) times daily.    . ondansetron (ZOFRAN) 8 MG tablet Take 1 tablet (8 mg total) by mouth every 8 (eight) hours as needed for nausea or vomiting.   [name] oxyCODONE-acetaminophen (PERCOCET/ROXICET) 5-325 MG tablet Take 1 tablet by mouth every 6 (six) hours as needed for severe pain.   [name] PACLitaxel (TAXOL IV) Inject into the vein. 6 weekly treatments with radiation   . pantoprazole (PROTONIX) 40 MG tablet Take 40 mg by mouth 2 (two) times daily.   . prochlorperazine (COMPAZINE) 10 MG tablet Take 1 tablet (10 mg total) by mouth every 6 (six) hours as needed for nausea or vomiting.   . triamcinolone cream (KENALOG) 0.1 % Apply topically 2 (two) times daily.    No facility-administered encounter medications on file as of 03/20/2018.     ALLERGIES:  Allergies  Allergen Reactions  . Lisinopril Anaphylaxis  . Penicillins Anaphylaxis    Has patient had a PCN reaction causing immediate rash, facial/tongue/throat swelling, SOB or lightheadedness with hypotension:Yes Has patient had a PCN reaction causing severe rash involving mucus membranes or skin necrosis:unsure Has patient had a PCN reaction that required hospitalization:unsure Has patient had a PCN reaction occurring within the last 10 years:~10 years per patient If all of the above answers are "NO", then may proceed with Cephalosporin use.   . Ace Inhibitors Swelling     PHYSICAL EXAM:  ECOG Performance status: 1-2 - Symptomatic; may require occasional assistance   Vitals:   03/20/18 1025  BP: 114/65  Pulse: 100  Resp: 18  Temp: 98.7 F (37.1 C)  SpO2: 100%   Filed Weights   03/20/18 1025  Weight: 130 lb 9.6 oz (59.2 kg)     Physical Exam  Constitutional: He is oriented to person, place, and time.  Seen in chemo chair in infusion area  -Chronically-ill appearing male in no acute distress; accompanied by his wife.   HENT:  Head: Normocephalic.  Mouth/Throat: Oropharynx is clear and  moist.  Eyes: Conjunctivae are normal. No scleral icterus.  Neck: Normal range of motion. Neck supple.  Cardiovascular: Regular rhythm.  Mild tachycardia appreciated  Pulmonary/Chest: Effort normal. No respiratory distress.  Diminished breath sounds bilat bases   Abdominal: Soft. Bowel sounds are normal. There is no tenderness.  No suprapubic tenderness appreciated on palpation   Musculoskeletal: Normal range of motion. He exhibits no edema.  Lymphadenopathy:    He has no cervical adenopathy.       Right: No supraclavicular adenopathy present.       Left: No supraclavicular adenopathy present.  Neurological: He is alert and oriented to person, place, and time. No cranial nerve deficit.  Skin: Skin is warm and dry. No rash noted.  Psychiatric: He has a normal mood and affect. Judgment normal.  Nursing note and vitals reviewed.    LABORATORY DATA:  I have reviewed the labs as listed.  CBC    Component Value Date/Time   WBC 9.4 03/13/2018 0958   RBC 3.51 (L) 03/13/2018 0958   HGB 10.4 (L) 03/13/2018 0958   HCT 31.6 (L) 03/13/2018 0958   PLT 499 (H) 03/13/2018 0958   MCV 90.0 03/13/2018 0958   MCH 29.6 03/13/2018 0958   MCHC 32.9 03/13/2018 0958  RDW 14.2 03/13/2018 0958   LYMPHSABS 0.7 03/13/2018 0958   MONOABS 1.3 (H) 03/13/2018 0958   EOSABS 0.1 03/13/2018 0958   BASOSABS 0.0 03/13/2018 0958   CMP Latest Ref Rng & Units 03/13/2018 03/06/2018 02/27/2018  Glucose 65 - 99 mg/dL 133(H) 125(H) 131(H)  BUN 6 - 20 mg/dL 13 14 11   Creatinine 0.61 - 1.24 mg/dL 0.58(L) 0.66 0.65  Sodium 135 - 145 mmol/L 134(L) 136 138  Potassium 3.5 - 5.1 mmol/L 5.0 4.3 4.2  Chloride 101 - 111 mmol/L 99(L) 98(L) 100(L)  CO2 22 - 32 mmol/L 25 24 23   Calcium 8.9 - 10.3 mg/dL 10.0 10.7(H) 11.3(H)  Total Protein 6.5 - 8.1 g/dL 8.3(H) 8.6(H) 8.3(H)  Total Bilirubin 0.3 - 1.2 mg/dL 0.7 1.2 1.1  Alkaline Phos 38 - 126 U/L 164(H) 193(H) 122  AST 15 - 41 U/L 42(H) 87(H) 40  ALT 17 - 63 U/L 55 103(H) 34      PENDING LABS:    DIAGNOSTIC IMAGING:    PATHOLOGY:     ASSESSMENT & PLAN:   Squamous cell carcinoma of (R) lung:  -Undergoing concurrent chemoXRT with weekly Carbo/Taxol.  -Due for cycle #5 Carbo/Taxol today.  Labs reviewed and are adequate for treatment.  His alkaline phosphatase and AST have normalized.  Albumin remains low at 2.9; he is working on his nutrition.  Platelets have normalized as well.  Continues to have anemia, with Hgb 10.2 g/dL, which is likely treatment-effect and stable.  Will proceed with treatment today as scheduled. -Grade 1 peripheral neuropathy noted to his hands and feet, "but it doesn't stay and it ain't that bad" per patient.   -Continue radiation as directed. Per patient, he will finish XRT on 04/02/18.  -Return to cancer center in 1 week for follow-up with Dr. Delton Coombes and consideration for next cycle of chemotherapy.   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  -Managed by urology; continue follow-up with their team as directed.   Protein-calorie malnutrition:  -Weight loss has stabilized. He has not lost any additional weight in the past week; again I commended his efforts and encouraged him to keep up the great work.   -Recommended continued supplementation with Ensure/Boost and high-calorie/high-protein foods as tolerated.  Our registered dietitian team continues to follow him as well; he was given an additional case of Ensure today.    Dysuria:  -Symptoms of dysuria for the past 5 days or so; burning noted at end of stream.  Denies any fever, chills, or hematuria.   -Will collect UA and urine culture today for further evaluation.    Neoplasm-related pain:  -Continues to have (R) scapular area pain, which is well-controlled with Percocet PRN.  [place] Controlled Substance Reporting System reviewed and refill is appropriate.  Rx e-scribed to his pharmacy using [employer] 2-step authentication process.        Dispo:  -Return to cancer center in 1 week for follow-up with Dr. Delton Coombes and consideration for next cycle of chemotherapy.    All questions were answered to patient's stated satisfaction. Encouraged patient to call with any new concerns or questions before his next visit to the cancer center and we can certain see him sooner, if needed.      Orders placed this encounter:  No orders of the defined types were placed in this encounter.     Mike Craze, NP [place] [phone]

## 2018-03-20 NOTE — Progress Notes (Signed)
Patient tolerated chemotherapy with no complaints voiced.  Port site clean and dry with no bruising or swelling noted at site.  Band aid applied.  VSS with discharge and left ambulatory with family with no s/s of distress ntoed.

## 2018-03-22 LAB — URINE CULTURE: CULTURE: NO GROWTH

## 2018-03-27 ENCOUNTER — Encounter (HOSPITAL_COMMUNITY): Payer: Self-pay | Admitting: Hematology

## 2018-03-27 ENCOUNTER — Other Ambulatory Visit: Payer: Self-pay

## 2018-03-27 ENCOUNTER — Inpatient Hospital Stay (HOSPITAL_COMMUNITY): Payer: Medicare Other

## 2018-03-27 ENCOUNTER — Inpatient Hospital Stay (HOSPITAL_BASED_OUTPATIENT_CLINIC_OR_DEPARTMENT_OTHER): Payer: Medicare Other | Admitting: Hematology

## 2018-03-27 ENCOUNTER — Encounter (HOSPITAL_COMMUNITY): Payer: Self-pay

## 2018-03-27 ENCOUNTER — Inpatient Hospital Stay (HOSPITAL_COMMUNITY): Payer: Medicare Other | Admitting: Dietician

## 2018-03-27 VITALS — BP 96/63 | HR 114 | Temp 97.6°F | Resp 20 | Wt 129.0 lb

## 2018-03-27 VITALS — BP 100/62 | HR 94 | Temp 98.0°F | Resp 18

## 2018-03-27 DIAGNOSIS — R634 Abnormal weight loss: Secondary | ICD-10-CM

## 2018-03-27 DIAGNOSIS — C3491 Malignant neoplasm of unspecified part of right bronchus or lung: Secondary | ICD-10-CM

## 2018-03-27 DIAGNOSIS — C61 Malignant neoplasm of prostate: Secondary | ICD-10-CM | POA: Diagnosis not present

## 2018-03-27 DIAGNOSIS — G893 Neoplasm related pain (acute) (chronic): Secondary | ICD-10-CM

## 2018-03-27 DIAGNOSIS — C3411 Malignant neoplasm of upper lobe, right bronchus or lung: Secondary | ICD-10-CM | POA: Diagnosis not present

## 2018-03-27 DIAGNOSIS — F17210 Nicotine dependence, cigarettes, uncomplicated: Secondary | ICD-10-CM

## 2018-03-27 DIAGNOSIS — Z5111 Encounter for antineoplastic chemotherapy: Secondary | ICD-10-CM | POA: Diagnosis not present

## 2018-03-27 LAB — COMPREHENSIVE METABOLIC PANEL
ALBUMIN: 3.2 g/dL — AB (ref 3.5–5.0)
ALK PHOS: 106 U/L (ref 38–126)
ALT: 22 U/L (ref 17–63)
ANION GAP: 11 (ref 5–15)
AST: 17 U/L (ref 15–41)
BUN: 15 mg/dL (ref 6–20)
CALCIUM: 9.7 mg/dL (ref 8.9–10.3)
CO2: 25 mmol/L (ref 22–32)
Chloride: 102 mmol/L (ref 101–111)
Creatinine, Ser: 0.6 mg/dL — ABNORMAL LOW (ref 0.61–1.24)
GFR calc non Af Amer: >60 mL/min (ref >60)
GLUCOSE: 120 mg/dL — AB (ref 65–99)
POTASSIUM: 4.9 mmol/L (ref 3.5–5.1)
SODIUM: 138 mmol/L (ref 135–145)
TOTAL PROTEIN: 8 g/dL (ref 6.5–8.1)
Total Bilirubin: 0.5 mg/dL (ref 0.3–1.2)

## 2018-03-27 LAB — CBC WITH DIFFERENTIAL/PLATELET
BASOS PCT: 0 %
Basophils Absolute: 0 10*3/uL (ref 0.0–0.1)
EOS ABS: 0.1 10*3/uL (ref 0.0–0.7)
EOS PCT: 1 %
HCT: 31.4 % — ABNORMAL LOW (ref 39.0–52.0)
HEMOGLOBIN: 10 g/dL — AB (ref 13.0–17.0)
LYMPHS ABS: 0.5 10*3/uL — AB (ref 0.7–4.0)
Lymphocytes Relative: 6 %
MCH: 29 pg (ref 26.0–34.0)
MCHC: 31.8 g/dL (ref 30.0–36.0)
MCV: 91 fL (ref 78.0–100.0)
MONO ABS: 0.9 10*3/uL (ref 0.1–1.0)
Monocytes Relative: 10 %
NEUTROS PCT: 83 %
Neutro Abs: 7 10*3/uL (ref 1.7–7.7)
Platelets: 278 10*3/uL (ref 150–400)
RBC: 3.45 MIL/uL — ABNORMAL LOW (ref 4.22–5.81)
RDW: 15.2 % (ref 11.5–15.5)
WBC: 8.5 10*3/uL (ref 4.0–10.5)

## 2018-03-27 MED ORDER — SODIUM CHLORIDE 0.9 % IV SOLN
Freq: Once | INTRAVENOUS | Status: AC
  Administered 2018-03-27: 10:00:00 via INTRAVENOUS

## 2018-03-27 MED ORDER — DIPHENHYDRAMINE HCL 50 MG/ML IJ SOLN
INTRAMUSCULAR | Status: AC
  Filled 2018-03-27: qty 1

## 2018-03-27 MED ORDER — SODIUM CHLORIDE 0.9 % IV SOLN
45.0000 mg/m2 | Freq: Once | INTRAVENOUS | Status: AC
  Administered 2018-03-27: 78 mg via INTRAVENOUS
  Filled 2018-03-27: qty 13

## 2018-03-27 MED ORDER — PALONOSETRON HCL INJECTION 0.25 MG/5ML
INTRAVENOUS | Status: AC
  Filled 2018-03-27: qty 5

## 2018-03-27 MED ORDER — FAMOTIDINE IN NACL 20-0.9 MG/50ML-% IV SOLN
INTRAVENOUS | Status: AC
  Filled 2018-03-27: qty 50

## 2018-03-27 MED ORDER — FAMOTIDINE IN NACL 20-0.9 MG/50ML-% IV SOLN
20.0000 mg | Freq: Once | INTRAVENOUS | Status: AC
  Administered 2018-03-27: 20 mg via INTRAVENOUS

## 2018-03-27 MED ORDER — HEPARIN SOD (PORK) LOCK FLUSH 100 UNIT/ML IV SOLN
500.0000 [IU] | Freq: Once | INTRAVENOUS | Status: AC | PRN
  Administered 2018-03-27: 500 [IU]

## 2018-03-27 MED ORDER — DIPHENHYDRAMINE HCL 50 MG/ML IJ SOLN
50.0000 mg | Freq: Once | INTRAMUSCULAR | Status: AC
  Administered 2018-03-27: 50 mg via INTRAVENOUS

## 2018-03-27 MED ORDER — PALONOSETRON HCL INJECTION 0.25 MG/5ML
0.2500 mg | Freq: Once | INTRAVENOUS | Status: AC
  Administered 2018-03-27: 0.25 mg via INTRAVENOUS

## 2018-03-27 MED ORDER — SODIUM CHLORIDE 0.9 % IV SOLN
20.0000 mg | Freq: Once | INTRAVENOUS | Status: AC
  Administered 2018-03-27: 20 mg via INTRAVENOUS
  Filled 2018-03-27: qty 2

## 2018-03-27 MED ORDER — SODIUM CHLORIDE 0.9 % IV SOLN
225.8000 mg | Freq: Once | INTRAVENOUS | Status: AC
  Administered 2018-03-27: 230 mg via INTRAVENOUS
  Filled 2018-03-27: qty 23

## 2018-03-27 NOTE — Progress Notes (Signed)
[place], [place] [address]   CLINIC:  Medical Oncology/Hematology  PCP:  Vidal Schwalbe, MD [address] [place]  [address] [phone]   REASON FOR VISIT:  Follow-up for lung cancer.  CURRENT THERAPY: Weekly carboplatin and paclitaxel along with radiation.  BRIEF ONCOLOGIC HISTORY:    Squamous cell carcinoma of right lung ([place])   12/13/2017 PET scan    IMPRESSION: 1. 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 There is early bony invasion of the right second rib and cortical destruction indicating local rib/chest wall invasion by tumor. 2. There is additional pleural thickening and pleural calcification along the right posterior pleural surface which is not separately hypermetabolic. 3. Small mediastinal lymph nodes are not appreciably hypermetabolic. 4. Multifocal and some diffuse regions of high activity in the bowel are likely physiologic. 5. No findings of metastatic disease to the neck, abdomen/pelvis, or to the included remainder of the skeleton. 6. Other imaging findings of potential clinical significance: Chronic right maxillary sinusitis. Aortic Atherosclerosis (ICD10-I70.0) and Emphysema (ICD10-J43.9). Chronic calcific pancreatitis.       01/15/2018 Initial Diagnosis    Squamous cell carcinoma of right lung ([place])      01/15/2018 Initial Biopsy    Lung needle core biopsy RUL: Squamous cell carcinoma      02/19/2018 Procedure    Port-a-cath placement by Dr. Arnoldo Morale       02/20/2018 -  Chemotherapy    The patient had palonosetron (ALOXI) injection 0.25 mg, 0.25 mg, Intravenous,  Once, 1 of 4 cycles Administration: 0.25 mg (02/20/2018) CARBOplatin (PARAPLATIN) 230 mg in sodium chloride 0.9 % 250 mL chemo infusion, 230 mg (100 % of original dose 225.8 mg), Intravenous,  Once, 1 of 4 cycles Dose modification:   (original dose 225.8  mg, Cycle 1) PACLitaxel (TAXOL) 78 mg in dextrose 5 % 250 mL chemo infusion (</= 80mg /m2), 45 mg/m2 = 78 mg, Intravenous,  Once, 1 of 4 cycles Administration: 78 mg (02/20/2018)  for chemotherapy treatment.        Adenocarcinoma of prostate ([place])   10/24/2015 Tumor Marker    PSA 1.53      01/06/2016 Tumor Marker    PSA 1.6      07/31/2017 Tumor Marker    PSA 4.6      01/17/2018 Initial Diagnosis    Adenocarcinoma of prostate ([place])      01/17/2018 Procedure    12-core prostate biopsy revealed 6/12 cores positive for prostate adenocarcinoma (Dr. [facility]).  (L) base lat: Gleason 9 (5+4), 20%. (L) mid lat: Gleason 9 (4+5), 40%. (L) apex lat: Gleason 9 (4+5), 20%. (L) base: Gleason 9 (5+4), 20%. (L) mid: Gleason 10 (5+5), 5%. (L) apex: Gleason 9 (5+4), 9%.          CANCER STAGING: Cancer Staging No matching staging information was found for the patient.   INTERVAL HISTORY:  Fred Hernandez 62 y.o. male returns for routine follow-up and consideration for next cycle of chemotherapy.   He is here for week 6 of chemotherapy.  Tolerating it fairly well.  Denies any tingling or numbness in extremities.  Complains of pain while swallowing.  He was given Magic mouthwash which he is swishing and spitting.  Fatigue is stable.  Shortness of breath on exertion is also stable.  He is managing his constipation with stool softener.  No fevers or ER visits reported.   REVIEW OF  SYSTEMS:  Review of Systems  Constitutional: Positive for fatigue.  HENT:   Positive for sore throat and trouble swallowing.   Respiratory: Positive for shortness of breath.   Gastrointestinal: Positive for constipation.  Psychiatric/Behavioral: Positive for sleep disturbance.     PAST MEDICAL/SURGICAL HISTORY:  Past Medical History:  Diagnosis Date  . Bursitis   . CAD (coronary artery disease)     s/p stent placement unknown artery 12/2014,  . Cancer [facility])    prostate cancer  . COPD (chronic  obstructive pulmonary disease) ([place])   . Diabetes mellitus ([place])   . GERD (gastroesophageal reflux disease)   . History of prostate cancer   . Hyperlipemia   . Hypertension   . Peripheral arterial disease ([place])    a. h/o stents. b. 03/2016  - successful PTA and covered stenting using overlapping Viabahn covered stents of a long segment in-stent restenosis of previously placed nitinol self expanding stents in the proximal mid and distal right SFA.  . Tobacco abuse    Past Surgical History:  Procedure Laterality Date  . COLONOSCOPY WITH PROPOFOL N/A 11/22/2016   Procedure: COLONOSCOPY WITH PROPOFOL;  Surgeon: [name], MD;  Location: [facility];  Service: Endoscopy;  Laterality: N/A;  10:00 am  . CORONARY STENT PLACEMENT  12-2014   [facility] [employer]  . FEMORAL ARTERY STENT Bilateral 03/2013  . PERIPHERAL VASCULAR CATHETERIZATION N/A 04/04/2016   Procedure: Lower Extremity Angiography;  Surgeon: Lorretta Harp, MD;  Location: [place] [facility] LAB;  Service: Cardiovascular;  Laterality: N/A;  . PERIPHERAL VASCULAR CATHETERIZATION N/A 04/04/2016   Procedure: Abdominal Aortogram;  Surgeon: Lorretta Harp, MD;  Location: [place] [facility] LAB;  Service: Cardiovascular;  Laterality: N/A;  . PERIPHERAL VASCULAR CATHETERIZATION  04/04/2016   Procedure: Peripheral Vascular Intervention;  Surgeon: Lorretta Harp, MD;  Location: [place] [facility] LAB;  Service: Cardiovascular;;  rt SFA stent  . POLYPECTOMY  11/22/2016   Procedure: POLYPECTOMY;  Surgeon: [name], MD;  Location: [facility];  Service: Endoscopy;;  descending colon polyp, transverse colon polyp, sigmoid colon polyp, rectal polyp  . PORTACATH PLACEMENT Left 02/19/2018   Procedure: INSERTION PORT-A-CATH;  Surgeon: Aviva Signs, MD;  Location: [facility] ORS;  Service: General;  Laterality: Left;  . STENT PLACEMENT VASCULAR ([place] [facility])  2013     SOCIAL HISTORY:  Social History   Socioeconomic History  . Marital status:  Single    Spouse name: Not on file  . Number of children: Not on file  . Years of education: Not on file  . Highest education level: Not on file  Occupational History  . Not on file  Social Needs  . Financial resource strain: Not on file  . Food insecurity:    Worry: Not on file    Inability: Not on file  . Transportation needs:    Medical: Not on file    Non-medical: Not on file  Tobacco Use  . Smoking status: Current Every Day Smoker    Packs/day: 0.25    Years: 45.00    Pack years: 11.25    Types: Cigarettes    Start date: 02/12/1971  . Smokeless tobacco: Never Used  Substance and Sexual Activity  . Alcohol use: Not Currently    Alcohol/week: 0.0 oz    Comment: stopped 2 weeks ago.  . Drug use: No  . Sexual activity: Yes  Lifestyle  . Physical activity:    Days per week: Not on file  Minutes per session: Not on file  . Stress: Not on file  Relationships  . Social connections:    Talks on phone: Not on file    Gets together: Not on file    Attends religious service: Not on file    Active member of club or organization: Not on file    Attends meetings of clubs or organizations: Not on file    Relationship status: Not on file  . Intimate partner violence:    Fear of current or ex partner: Not on file    Emotionally abused: Not on file    Physically abused: Not on file    Forced sexual activity: Not on file  Other Topics Concern  . Not on file  Social History Narrative  . Not on file    FAMILY HISTORY:  Family History  Problem Relation Age of Onset  . Diabetes Mother   . Heart disease Mother   . Hypertension Mother   . Healthy Brother   . Other Brother        accident   . Arthritis Sister   . Arthritis Sister   . SIDS Brother   . Colon cancer Neg Hx     CURRENT MEDICATIONS:  Outpatient Encounter Medications as of 2020-01-718  Medication Sig Note  . aspirin 81 MG chewable tablet Chew 81 mg by mouth daily. 02/14/2018: Last dose 02/13/2018. On hold for  surgery.  [name] atorvastatin (LIPITOR) 80 MG tablet Take 80 mg by mouth at bedtime.    [name] CARBOPLATIN IV Inject into the vein. 6 weekly treatments with radiation   . cilostazol (PLETAL) 50 MG tablet TAKE 1 TABLET(50 MG) BY MOUTH TWICE DAILY 02/14/2018: Last dose of pletal was 4/16. On hold for surgery.  . Coenzyme Q10 100 MG capsule Take 100 mg by mouth at bedtime.    . Diphenhyd-Hydrocort-Nystatin (FIRST-DUKES MOUTHWASH MT) Take by mouth.   . dronabinol (MARINOL) 5 MG capsule Take 1 capsule (5 mg total) by mouth 2 (two) times daily before a meal.   . FLOVENT HFA 110 MCG/ACT inhaler Inhale 1 puff into the lungs 2 (two) times daily.   . fluticasone (FLONASE) 50 MCG/ACT nasal spray Place 1 spray into both nostrils daily.   [name] ipratropium-albuterol (DUONEB) 0.5-2.5 (3) MG/3ML SOLN Take 3 mLs by nebulization every 6 (six) hours as needed (for shortness of breath).   . lidocaine-prilocaine (EMLA) cream Apply a quarter size amount to affected area 1 hour prior to coming to chemotherapy.  Do not rub in.  Cover with plastic wrap.   . metFORMIN (GLUCOPHAGE) 500 MG tablet Take 500 mg by mouth 2 (two) times daily.    . metoprolol succinate (TOPROL-XL) 50 MG 24 hr tablet Take 50 mg by mouth at bedtime.    [name] nystatin (MYCOSTATIN) 100000 UNIT/ML suspension    . Omega-3 Fatty Acids (FISH OIL) 1000 MG CAPS Take 1 capsule by mouth 2 (two) times daily.    . ondansetron (ZOFRAN) 8 MG tablet Take 1 tablet (8 mg total) by mouth every 8 (eight) hours as needed for nausea or vomiting.   [name] oxyCODONE-acetaminophen (PERCOCET/ROXICET) 5-325 MG tablet Take 1 tablet by mouth every 6 (six) hours as needed for severe pain.   [name] PACLitaxel (TAXOL IV) Inject into the vein. 6 weekly treatments with radiation   . pantoprazole (PROTONIX) 40 MG tablet Take 40 mg by mouth 2 (two) times daily.   . prochlorperazine (COMPAZINE) 10 MG tablet Take 1 tablet (10 mg total) by mouth  every 6 (six) hours as needed for nausea or vomiting.   .  triamcinolone cream (KENALOG) 0.1 % Apply topically 2 (two) times daily.    No facility-administered encounter medications on file as of 09/01/2018.     ALLERGIES:  Allergies  Allergen Reactions  . Lisinopril Anaphylaxis  . Penicillins Anaphylaxis    Has patient had a PCN reaction causing immediate rash, facial/tongue/throat swelling, SOB or lightheadedness with hypotension:Yes Has patient had a PCN reaction causing severe rash involving mucus membranes or skin necrosis:unsure Has patient had a PCN reaction that required hospitalization:unsure Has patient had a PCN reaction occurring within the last 10 years:~10 years per patient If all of the above answers are "NO", then may proceed with Cephalosporin use.   . Ace Inhibitors Swelling     PHYSICAL EXAM:  ECOG Performance status: 1  Vitals:   03/27/18 0945  BP: 96/63  Pulse: (!) 114  Resp: 20  Temp: 97.6 F (36.4 C)  SpO2: 99%   Filed Weights   03/27/18 0945  Weight: 129 lb (58.5 kg)    Physical Exam HEENT shows oropharynx is no thrush or ecchymosis. Chest is bilaterally clear to auscultation. Cardiovascular S1-S2 regular rate and rhythm. Abdomen is soft nontender. Extremities no edema or cyanosis.  LABORATORY DATA:  I have reviewed the labs as listed.  CBC    Component Value Date/Time   WBC 8.5 011/12/2017 0929   RBC 3.45 (L) 011/12/2017 0929   HGB 10.0 (L) 011/12/2017 0929   HCT 31.4 (L) 011/12/2017 0929   PLT 278 011/12/2017 0929   MCV 91.0 011/12/2017 0929   MCH 29.0 011/12/2017 0929   MCHC 31.8 011/12/2017 0929   RDW 15.2 011/12/2017 0929   LYMPHSABS 0.5 (L) 011/12/2017 0929   MONOABS 0.9 011/12/2017 0929   EOSABS 0.1 011/12/2017 0929   BASOSABS 0.0 011/12/2017 0929   CMP Latest Ref Rng & Units 09/01/2018 03/20/2018 03/13/2018  Glucose 65 - 99 mg/dL 120(H) 131(H) 133(H)  BUN 6 - 20 mg/dL 15 13 13   Creatinine 0.61 - 1.24 mg/dL 0.60(L) 0.54(L) 0.58(L)  Sodium 135 - 145 mmol/L 138 133(L) 134(L)  Potassium 3.5 - 5.1  mmol/L 4.9 4.2 5.0  Chloride 101 - 111 mmol/L 102 98(L) 99(L)  CO2 22 - 32 mmol/L 25 23 25   Calcium 8.9 - 10.3 mg/dL 9.7 9.5 10.0  Total Protein 6.5 - 8.1 g/dL 8.0 7.9 8.3(H)  Total Bilirubin 0.3 - 1.2 mg/dL 0.5 0.6 0.7  Alkaline Phos 38 - 126 U/L 106 122 164(H)  AST 15 - 41 U/L 17 19 42(H)  ALT 17 - 63 U/L 22 29 55       ASSESSMENT & PLAN:   Squamous cell carcinoma of right lung (HCC) 1.  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 -Patient evaluated by Dr. Roxan Hockey and was recommended to have preoperative chemoradiation therapy. -Chemoradiation therapy with carboplatin and paclitaxel started on 02/20/2018.  Today his blood counts are adequate to proceed with week 6 of carboplatin and paclitaxel.  He will finish his last radiation on 04/04/2018 which is next Wednesday.  Today will be his last chemotherapy.  I have scheduled him for a CT scan of the chest in 5 weeks.  We plan to start him on immunotherapy with durvalumab 6 to 8 weeks after finishing radiation.  2.  Weight loss: His weight has been stable for the past 2  weeks.  He is drinking about 6 to 7 cans of Ensure.  He does report some throat pain.  He is using Magic mouthwash but spitting it.  I have told him to swish and swallow it.  3.  Recurrent prostate cancer: He had a history of prostate cancer and reportedly received radiation therapy 6-7 years ago.  He had prostate biopsies on 01/17/2018 which showed recurrence with a Gleason score (5+5)=10.  He will be treated with GnRH agonist.  4.  Cancer related pain: -Complains of right upper back/shoulder pain.  He is requiring Percocet 2 to 3/day on bad day.  Some days he does not require any.  This is fairly controlled.      Orders placed this encounter:  Orders Placed This Encounter  Procedures  . CT Chest W Contrast  . CBC with Differential  .  Comprehensive metabolic panel      Derek Jack, MD [facility] [phone]

## 2018-03-27 NOTE — Progress Notes (Addendum)
Nutrition Follow-up  62 y/o male PMHx tobacco abuse, COPD, PAD, DM2, HLD/HTN, CAD, GERD, Prostate Cancer. CT performed 1/21 d/t  chest pain revealed likely lung cancer, w/ subsequent biopsy confirmation. Referred to RD due to poor appetite and weight loss prior to undergoing chemoradiation.   RD following up again with high risk patient w/ poor appetite/drastic weight loss.   He has lost a lb since last week. Spouse/pt do not understand why as pt has been "eating nonstop". This is a dramatic change from a few weeks prior when he was eating essentially nothing and obtaining all calories from supplements.   Items the patient has been eating include: A Dill-Roast, Hamburgers, Hotdogs, Mac N Cheese, Chicken and collards. All of these items, with maybe the exception of collards, are good choices.   He is still drinking ensure, but not as many since he is eating now (he could not give number of how many consumes/day). He also notes they "are so expensive".  He also comments that he can only consume so much of the Ensures before "they start to ball up in my throat". He likens this feeling to that of the time he had Thrush and was given diflucan. He says he was using Dukes MouthWash, but was apparently spitting it out instead of drinking it as is instructed.   Patient denies any n/v/c/d. He says the dysuria he had last week went away. His UA was negative. Other than poor appetite, patient continues to tolerate chemotherapy well.   Wt Readings from Last 10 Encounters:  03/27/18 129 lb (58.5 kg)  03/20/18 130 lb 9.6 oz (59.2 kg)  03/13/18 130 lb 9.6 oz (59.2 kg)  03/06/18 131 lb 12.8 oz (59.8 kg)  02/27/18 138 lb 8 oz (62.8 kg)  02/20/18 143 lb (64.9 kg)  02/19/18 145 lb (65.8 kg)  02/14/18 145 lb (65.8 kg)  02/13/18 145 lb (65.8 kg)  01/25/18 147 lb 6.4 oz (66.9 kg)   MEDICATIONS:  Chemo: Carboplatin, taxol.  Supportive meds: Compazine, Omega 3s, perocet, zofran, ppi, nystatin, Dukes Mouth  Wash Other: metformin  LABS:  Reviewed. Albumin 3.2 up from 2.9 last week, renal labs stable, K up to 4.9 Glucose stable.   Recent Labs  Lab 03/27/18 0929  NA 138  K 4.9  CL 102  CO2 25  BUN 15  CREATININE 0.60*  CALCIUM 9.7  GLUCOSE 120*   ANTHROPOMETRICS: Height:_0  (172.72 cm) Weight:129 lbs (58.5 kg) BMI:19.6-healthy wt for ht UBW:167 lbs  IBW:154 lbs (70 kg) Wt change-exact same wt as last week  ESTIMATED ENERGY NEEDS: Kcal:~2100 kcals (35  kcal/kg bw)  Protein:~90 g Pro (1.5 g/kg bw) Fluid:>2.1 L (35 ml/kg bw)  NUTRITION DIAGNOSIS:  Severe malnutrition related to cancer and cancer related treatments AEB loss of 9.5 lbs (>6.5%) in just 4 wks.   DOCUMENTATION CODES:  Severe malnutrition; acute on chronic context  INTERVENTION:  RD explained to patient/spouse how body metabolism is altered in cancer; anabolic activity is reduced and it is extremely difficult to put on weight, especially when receiving treatment. He should not be disappointed he did not gain weight and, in fact, should be happy that he is now eating regular items again, when he was relying 100% on supplements a couple weeks ago. RD did comment that he actually may be consuming less calories as Ensure is extremely calorie dense, much more so than the items he is eating, however, RD does not want him to only drink Ensure for QOL purposes.  RD reviewed that he should sip on Ensures during day or eat them with meals. He should continue to eat the high protein/kcal items he enjoys.   This is his last chemo tx. He says he finish radiation next Wednesday. He is very much looking forward to his "vacation". RD comments that he should certainly enjoy his time off, but still focus on his nutrition.  RD conversed with other cancer center dietitian regarding giving more Ensure cases. It is felt to be appropriate given his high reliance on them, significant wt loss/severe  malnutrition and the fact he is in active treatment. Therefore, he is given his 5th Ensure Case today.   Patient/spouse appreciative. Will follow up when pt returns from hiatus in 5 or so weeks.   GOAL:  Oral intake to meet >90% needs, wt stability  MET  MONITOR:  Intake, weight, chemotherapy tolerance, goals of care  Next Visit:  5 weeks. Addendum. RD will be on PAL the Tues patient is scheduled to return. Will place on Friday RD schedule as he is scheduled to have imaging Friday Prior. RD called spouse who acknowledged appt time on day of imaging, just needs to arrival to hosp earlier.   Burtis Junes RD, LDN, CNSC Clinical Nutrition Available Tues-Sat via Pager: [phone] 09-10-2018 12:35 PM

## 2018-03-27 NOTE — Assessment & Plan Note (Signed)
1.  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 -Patient evaluated by Dr. Roxan Hockey and was recommended to have preoperative chemoradiation therapy. -Chemoradiation therapy with carboplatin and paclitaxel started on 02/20/2018.  Today his blood counts are adequate to proceed with week 6 of carboplatin and paclitaxel.  He will finish his last radiation on 04/04/2018 which is next Wednesday.  Today will be his last chemotherapy.  I have scheduled him for a CT scan of the chest in 5 weeks.  We plan to start him on immunotherapy with durvalumab 6 to 8 weeks after finishing radiation.  2.  Weight loss: His weight has been stable for the past 2 weeks.  He is drinking about 6 to 7 cans of Ensure.  He does report some throat pain.  He is using Magic mouthwash but spitting it.  I have told him to swish and swallow it.  3.  Recurrent prostate cancer: He had a history of prostate cancer and reportedly received radiation therapy 6-7 years ago.  He had prostate biopsies on 01/17/2018 which showed recurrence with a Gleason score (5+5)=10.  He will be treated with GnRH agonist.  4.  Cancer related pain: -Complains of right upper back/shoulder pain.  He is requiring Percocet 2 to 3/day on bad day.  Some days he does not require any.  This is fairly controlled.

## 2018-03-27 NOTE — Progress Notes (Signed)
Tolerated infusions w/o adverse reaction.  Alert, in no distress.  VSS.  Discharged ambulatory in c/o spouse.

## 2018-04-02 ENCOUNTER — Other Ambulatory Visit: Payer: Self-pay | Admitting: Cardiovascular Disease

## 2018-04-02 DIAGNOSIS — I739 Peripheral vascular disease, unspecified: Secondary | ICD-10-CM

## 2018-04-04 ENCOUNTER — Ambulatory Visit: Payer: Medicare Other | Admitting: Cardiovascular Disease

## 2018-04-04 ENCOUNTER — Ambulatory Visit (INDEPENDENT_AMBULATORY_CARE_PROVIDER_SITE_OTHER): Payer: Medicare Other | Admitting: Urology

## 2018-04-04 DIAGNOSIS — C61 Malignant neoplasm of prostate: Secondary | ICD-10-CM | POA: Diagnosis not present

## 2018-04-10 ENCOUNTER — Telehealth (HOSPITAL_COMMUNITY): Payer: Self-pay

## 2018-04-10 DIAGNOSIS — C3491 Malignant neoplasm of unspecified part of right bronchus or lung: Secondary | ICD-10-CM

## 2018-04-10 MED ORDER — OXYCODONE-ACETAMINOPHEN 5-325 MG PO TABS: 1.0000 | ORAL_TABLET | Freq: Four times a day (QID) | ORAL | 0 refills | Status: DC | PRN

## 2018-04-10 NOTE — Telephone Encounter (Signed)
-----   Message from Fred Hernandez sent at 04/09/2018  9:26 AM EDT ----- Regarding: refill Patient would like refill on pain med on oxyCODONE-acetaminophen (PERCOCET/ROXICET) 5-325 MG tablet.  Patient uses  [facility] in [place]

## 2018-04-10 NOTE — Telephone Encounter (Signed)
Reviewed with Dr. Delton Coombes. He okayed refill. Prescription printed for his signature.

## 2018-04-12 ENCOUNTER — Ambulatory Visit (HOSPITAL_COMMUNITY)
Admission: RE | Admit: 2018-04-12 | Discharge: 2018-04-12 | Disposition: A | Discharge status: Home / Self Care | Payer: Medicare Other | Source: Ambulatory Visit | Attending: Cardiology | Admitting: Cardiology

## 2018-04-12 DIAGNOSIS — I739 Peripheral vascular disease, unspecified: Secondary | ICD-10-CM | POA: Insufficient documentation

## 2018-04-12 DIAGNOSIS — Z95828 Presence of other vascular implants and grafts: Secondary | ICD-10-CM | POA: Diagnosis not present

## 2018-04-17 ENCOUNTER — Other Ambulatory Visit: Payer: Self-pay | Admitting: *Deleted

## 2018-04-17 DIAGNOSIS — I739 Peripheral vascular disease, unspecified: Secondary | ICD-10-CM

## 2018-04-18 ENCOUNTER — Encounter: Payer: Self-pay | Admitting: Cardiovascular Disease

## 2018-04-18 ENCOUNTER — Ambulatory Visit (INDEPENDENT_AMBULATORY_CARE_PROVIDER_SITE_OTHER): Payer: Medicare Other | Admitting: Cardiovascular Disease

## 2018-04-18 VITALS — BP 94/60 | HR 104 | Ht 68.0 in | Wt 132.0 lb

## 2018-04-18 DIAGNOSIS — I739 Peripheral vascular disease, unspecified: Secondary | ICD-10-CM | POA: Diagnosis not present

## 2018-04-18 DIAGNOSIS — I10 Essential (primary) hypertension: Secondary | ICD-10-CM | POA: Diagnosis not present

## 2018-04-18 NOTE — Assessment & Plan Note (Signed)
History of prior left lower extremity stenting.  I did recanalize a chronically occluded right SFA with Viabahn  covered stents with improvement in his claudication and Doppler studies however he had recurrent symptoms, decline in his ABI and occluded Viabahn stents.  Recent Doppler showed patent left SFA stent with a diminished right ABI.  His only revascularization options on the right would be femoropopliteal bypass grafting.  He really denies significant lifestyle limiting claudication.

## 2018-04-18 NOTE — Progress Notes (Signed)
04/18/2018 Fred Hernandez   [birth year]  [id number]  Primary Physician Vidal Schwalbe, MD Primary Cardiologist: Lorretta Harp MD Lupe Carney, [place]  HPI:  Fred Hernandez is a 62 y.o.  engaged African-American male with no children accompanied by his fiancs [name]. He was referred by Dr. Bronson Ing for evaluation of symptomatic PAD. I last saw him in the office 07/12/2017.He has a history of 75 years of tobacco abuse smoking 1-1/2 packs per day In addition, he has treated hypertension, hyperlipidemia and diabetes. He did have a stent placed in his coronary arteries in 2016 after positive stress test. He's had recurrent claudication over the last 6 months in his right lower extremity. Dopplers performed 03/10/16 revealed a right ABI of 0.67 with occluded right SFA. . I performed angiography on him 04/04/16 confirming occluded SFA from the origin down to the adductor canal with two-vessel runoff . I was able to revascularize him percutaneously with overlapping Viabahn covered stents with an excellent clinical angiographic and ultrasonographic result. His follow-up Dopplers performed 04/13/16 revealed an improvement in his right ABI from 0.67 up to .95. He had recurrent discomfort in his right leg beginning late last year and follow-up Dopplers performed 12/14/16 revealed a decline in his right ABI 0.7 and an occluded right SFA.   Since I saw him back 9 months ago he has stopped smoking because he is developed lung cancer and prostate cancer.  He does have some mild claudication which is not lifestyle limiting.  Recent Dopplers showed patent left SFA stent with an occluded right SFA.      Current Meds  Medication Sig  . aspirin 81 MG chewable tablet Chew 81 mg by mouth daily.  [name] atorvastatin (LIPITOR) 80 MG tablet Take 80 mg by mouth at bedtime.   [name] CARBOPLATIN IV Inject into the vein. 6 weekly treatments with radiation  . cilostazol (PLETAL) 50 MG tablet TAKE 1 TABLET(50 MG) BY MOUTH TWICE  DAILY  . Coenzyme Q10 100 MG capsule Take 100 mg by mouth at bedtime.   . Diphenhyd-Hydrocort-Nystatin (FIRST-DUKES MOUTHWASH MT) Take by mouth.  . dronabinol (MARINOL) 5 MG capsule Take 1 capsule (5 mg total) by mouth 2 (two) times daily before a meal.  . FLOVENT HFA 110 MCG/ACT inhaler Inhale 1 puff into the lungs 2 (two) times daily.  . fluticasone (FLONASE) 50 MCG/ACT nasal spray Place 1 spray into both nostrils daily.  [name] ipratropium-albuterol (DUONEB) 0.5-2.5 (3) MG/3ML SOLN Take 3 mLs by nebulization every 6 (six) hours as needed (for shortness of breath).  . lidocaine-prilocaine (EMLA) cream Apply a quarter size amount to affected area 1 hour prior to coming to chemotherapy.  Do not rub in.  Cover with plastic wrap.  . metFORMIN (GLUCOPHAGE) 500 MG tablet Take 500 mg by mouth 2 (two) times daily.   . metoprolol succinate (TOPROL-XL) 50 MG 24 hr tablet Take 50 mg by mouth at bedtime.   [name] nystatin (MYCOSTATIN) 100000 UNIT/ML suspension   . Omega-3 Fatty Acids (FISH OIL) 1000 MG CAPS Take 1 capsule by mouth 2 (two) times daily.   . ondansetron (ZOFRAN) 8 MG tablet Take 1 tablet (8 mg total) by mouth every 8 (eight) hours as needed for nausea or vomiting.  [name] oxyCODONE-acetaminophen (PERCOCET/ROXICET) 5-325 MG tablet Take 1 tablet by mouth every 6 (six) hours as needed for severe pain.  [name] PACLitaxel (TAXOL IV) Inject into the vein. 6 weekly treatments with radiation  . pantoprazole (PROTONIX) 40 MG tablet  Take 40 mg by mouth 2 (two) times daily.  . prochlorperazine (COMPAZINE) 10 MG tablet Take 1 tablet (10 mg total) by mouth every 6 (six) hours as needed for nausea or vomiting.  . triamcinolone cream (KENALOG) 0.1 % Apply topically 2 (two) times daily.     Allergies  Allergen Reactions  . Lisinopril Anaphylaxis  . Penicillins Anaphylaxis    Has patient had a PCN reaction causing immediate rash, facial/tongue/throat swelling, SOB or lightheadedness with hypotension:Yes Has patient had a  PCN reaction causing severe rash involving mucus membranes or skin necrosis:unsure Has patient had a PCN reaction that required hospitalization:unsure Has patient had a PCN reaction occurring within the last 10 years:~10 years per patient If all of the above answers are "NO", then may proceed with Cephalosporin use.   . Ace Inhibitors Swelling    Social History   Socioeconomic History  . Marital status: Single    Spouse name: Not on file  . Number of children: Not on file  . Years of education: Not on file  . Highest education level: Not on file  Occupational History  . Not on file  Social Needs  . Financial resource strain: Not on file  . Food insecurity:    Worry: Not on file    Inability: Not on file  . Transportation needs:    Medical: Not on file    Non-medical: Not on file  Tobacco Use  . Smoking status: Current Every Day Smoker    Packs/day: 0.25    Years: 45.00    Pack years: 11.25    Types: Cigarettes    Start date: 02/12/1971  . Smokeless tobacco: Never Used  Substance and Sexual Activity  . Alcohol use: Not Currently    Alcohol/week: 0.0 oz    Comment: stopped 2 weeks ago.  . Drug use: No  . Sexual activity: Yes  Lifestyle  . Physical activity:    Days per week: Not on file    Minutes per session: Not on file  . Stress: Not on file  Relationships  . Social connections:    Talks on phone: Not on file    Gets together: Not on file    Attends religious service: Not on file    Active member of club or organization: Not on file    Attends meetings of clubs or organizations: Not on file    Relationship status: Not on file  . Intimate partner violence:    Fear of current or ex partner: Not on file    Emotionally abused: Not on file    Physically abused: Not on file    Forced sexual activity: Not on file  Other Topics Concern  . Not on file  Social History Narrative  . Not on file     Review of Systems: General: negative for chills, fever, night  sweats or weight changes.  Cardiovascular: negative for chest pain, dyspnea on exertion, edema, orthopnea, palpitations, paroxysmal nocturnal dyspnea or shortness of breath Dermatological: negative for rash Respiratory: negative for cough or wheezing Urologic: negative for hematuria Abdominal: negative for nausea, vomiting, diarrhea, bright red blood per rectum, melena, or hematemesis Neurologic: negative for visual changes, syncope, or dizziness All other systems reviewed and are otherwise negative except as noted above.    Blood pressure 94/60, pulse (!) 104, height 5\' 8"  (1.727 m), weight 132 lb (59.9 kg).  General appearance: alert and no distress Neck: no adenopathy, no carotid bruit, no JVD, supple, symmetrical, trachea midline and  thyroid not enlarged, symmetric, no tenderness/mass/nodules Lungs: clear to auscultation bilaterally Heart: regular rate and rhythm, S1, S2 normal, no murmur, click, rub or gallop Extremities: extremities normal, atraumatic, no cyanosis or edema Pulses: Absent right pedal pulse Skin: Skin color, texture, turgor normal. No rashes or lesions Neurologic: Alert and oriented X 3, normal strength and tone. Normal symmetric reflexes. Normal coordination and gait  EKG sinus tachycardia at 104 without ST or T wave changes.  I personally reviewed this EKG.  ASSESSMENT AND PLAN:   Peripheral arterial disease ([place]) History of prior left lower extremity stenting.  I did recanalize a chronically occluded right SFA with Viabahn  covered stents with improvement in his claudication and Doppler studies however he had recurrent symptoms, decline in his ABI and occluded Viabahn stents.  Recent Doppler showed patent left SFA stent with a diminished right ABI.  His only revascularization options on the right would be femoropopliteal bypass grafting.  He really denies significant lifestyle limiting claudication.      Lorretta Harp MD FACP,FACC,FAHA,  [facility] 04/18/2018 11:27 AM

## 2018-04-18 NOTE — Patient Instructions (Signed)
Medication Instructions: Your physician recommends that you continue on your current medications as directed. Please refer to the Current Medication list given to you today.   Follow-Up: Your physician recommends that you schedule a follow-up appointment as needed with Dr. Berry.    

## 2018-04-27 ENCOUNTER — Encounter (HOSPITAL_COMMUNITY): Payer: Medicare Other

## 2018-04-27 ENCOUNTER — Ambulatory Visit (HOSPITAL_COMMUNITY)
Admission: RE | Admit: 2018-04-27 | Discharge: 2018-04-27 | Disposition: A | Discharge status: Home / Self Care | Payer: Medicare Other | Source: Ambulatory Visit | Attending: Hematology | Admitting: Hematology

## 2018-04-27 ENCOUNTER — Encounter (HOSPITAL_COMMUNITY): Payer: Self-pay

## 2018-04-27 ENCOUNTER — Inpatient Hospital Stay (HOSPITAL_COMMUNITY): Payer: Medicare Other | Attending: Hematology

## 2018-04-27 DIAGNOSIS — Z923 Personal history of irradiation: Secondary | ICD-10-CM | POA: Diagnosis not present

## 2018-04-27 DIAGNOSIS — I700 Atherosclerosis of aorta: Secondary | ICD-10-CM | POA: Diagnosis not present

## 2018-04-27 DIAGNOSIS — C3491 Malignant neoplasm of unspecified part of right bronchus or lung: Secondary | ICD-10-CM

## 2018-04-27 DIAGNOSIS — J439 Emphysema, unspecified: Secondary | ICD-10-CM | POA: Diagnosis not present

## 2018-04-27 DIAGNOSIS — C3411 Malignant neoplasm of upper lobe, right bronchus or lung: Secondary | ICD-10-CM | POA: Insufficient documentation

## 2018-04-27 LAB — COMPREHENSIVE METABOLIC PANEL
ALT: 18 U/L (ref 0–44)
AST: 17 U/L (ref 15–41)
Albumin: 3.7 g/dL (ref 3.5–5.0)
Alkaline Phosphatase: 83 U/L (ref 38–126)
Anion gap: 7 (ref 5–15)
BILIRUBIN TOTAL: 0.3 mg/dL (ref 0.3–1.2)
BUN: 14 mg/dL (ref 8–23)
CO2: 24 mmol/L (ref 22–32)
Calcium: 9.7 mg/dL (ref 8.9–10.3)
Chloride: 109 mmol/L (ref 98–111)
Creatinine, Ser: 0.52 mg/dL — ABNORMAL LOW (ref 0.61–1.24)
Glucose, Bld: 108 mg/dL — ABNORMAL HIGH (ref 70–99)
POTASSIUM: 4.7 mmol/L (ref 3.5–5.1)
Sodium: 140 mmol/L (ref 135–145)
TOTAL PROTEIN: 7.3 g/dL (ref 6.5–8.1)

## 2018-04-27 LAB — CBC WITH DIFFERENTIAL/PLATELET
BASOS ABS: 0 10*3/uL (ref 0.0–0.1)
Basophils Relative: 0 %
Eosinophils Absolute: 0.2 10*3/uL (ref 0.0–0.7)
Eosinophils Relative: 3 %
HEMATOCRIT: 33.5 % — AB (ref 39.0–52.0)
Hemoglobin: 10.8 g/dL — ABNORMAL LOW (ref 13.0–17.0)
LYMPHS PCT: 12 %
Lymphs Abs: 1 10*3/uL (ref 0.7–4.0)
MCH: 30.3 pg (ref 26.0–34.0)
MCHC: 32.2 g/dL (ref 30.0–36.0)
MCV: 93.8 fL (ref 78.0–100.0)
MONO ABS: 0.7 10*3/uL (ref 0.1–1.0)
MONOS PCT: 9 %
NEUTROS ABS: 6 10*3/uL (ref 1.7–7.7)
NEUTROS PCT: 76 %
Platelets: 275 10*3/uL (ref 150–400)
RBC: 3.57 MIL/uL — ABNORMAL LOW (ref 4.22–5.81)
RDW: 18.5 % — AB (ref 11.5–15.5)
WBC: 8 10*3/uL (ref 4.0–10.5)

## 2018-04-27 MED ORDER — IOHEXOL 300 MG/ML  SOLN
75.0000 mL | Freq: Once | INTRAMUSCULAR | Status: AC | PRN
  Administered 2018-04-27: 75 mL via INTRAVENOUS

## 2018-04-27 NOTE — Progress Notes (Signed)
Nutrition  Patient was a no show for nutrition appointment today.    RD to follow-up as able.  Arah Aro B. Zenia Resides, [place], [place] Registered Dietitian [phone] (pager)

## 2018-05-01 ENCOUNTER — Inpatient Hospital Stay (HOSPITAL_COMMUNITY): Payer: Medicare Other | Attending: Internal Medicine | Admitting: Hematology

## 2018-05-01 ENCOUNTER — Other Ambulatory Visit: Payer: Self-pay

## 2018-05-01 ENCOUNTER — Encounter (HOSPITAL_COMMUNITY): Payer: Self-pay | Admitting: Hematology

## 2018-05-01 VITALS — BP 114/63 | HR 103 | Temp 98.2°F | Resp 18 | Wt 137.5 lb

## 2018-05-01 DIAGNOSIS — E785 Hyperlipidemia, unspecified: Secondary | ICD-10-CM | POA: Diagnosis not present

## 2018-05-01 DIAGNOSIS — C61 Malignant neoplasm of prostate: Secondary | ICD-10-CM | POA: Insufficient documentation

## 2018-05-01 DIAGNOSIS — I700 Atherosclerosis of aorta: Secondary | ICD-10-CM | POA: Diagnosis not present

## 2018-05-01 DIAGNOSIS — I10 Essential (primary) hypertension: Secondary | ICD-10-CM | POA: Insufficient documentation

## 2018-05-01 DIAGNOSIS — E119 Type 2 diabetes mellitus without complications: Secondary | ICD-10-CM | POA: Diagnosis not present

## 2018-05-01 DIAGNOSIS — Z923 Personal history of irradiation: Secondary | ICD-10-CM | POA: Insufficient documentation

## 2018-05-01 DIAGNOSIS — M25511 Pain in right shoulder: Secondary | ICD-10-CM | POA: Diagnosis not present

## 2018-05-01 DIAGNOSIS — Z5111 Encounter for antineoplastic chemotherapy: Secondary | ICD-10-CM

## 2018-05-01 DIAGNOSIS — I739 Peripheral vascular disease, unspecified: Secondary | ICD-10-CM | POA: Insufficient documentation

## 2018-05-01 DIAGNOSIS — J439 Emphysema, unspecified: Secondary | ICD-10-CM | POA: Diagnosis not present

## 2018-05-01 DIAGNOSIS — Z7982 Long term (current) use of aspirin: Secondary | ICD-10-CM | POA: Insufficient documentation

## 2018-05-01 DIAGNOSIS — Z5112 Encounter for antineoplastic immunotherapy: Secondary | ICD-10-CM | POA: Diagnosis not present

## 2018-05-01 DIAGNOSIS — Z9221 Personal history of antineoplastic chemotherapy: Secondary | ICD-10-CM | POA: Diagnosis not present

## 2018-05-01 DIAGNOSIS — I2510 Atherosclerotic heart disease of native coronary artery without angina pectoris: Secondary | ICD-10-CM | POA: Insufficient documentation

## 2018-05-01 DIAGNOSIS — K219 Gastro-esophageal reflux disease without esophagitis: Secondary | ICD-10-CM | POA: Insufficient documentation

## 2018-05-01 DIAGNOSIS — C3411 Malignant neoplasm of upper lobe, right bronchus or lung: Secondary | ICD-10-CM | POA: Diagnosis present

## 2018-05-01 DIAGNOSIS — F17210 Nicotine dependence, cigarettes, uncomplicated: Secondary | ICD-10-CM | POA: Insufficient documentation

## 2018-05-01 DIAGNOSIS — Z79899 Other long term (current) drug therapy: Secondary | ICD-10-CM | POA: Insufficient documentation

## 2018-05-01 DIAGNOSIS — C3491 Malignant neoplasm of unspecified part of right bronchus or lung: Secondary | ICD-10-CM

## 2018-05-01 MED ORDER — OXYCODONE-ACETAMINOPHEN 5-325 MG PO TABS: 1.0000 | ORAL_TABLET | Freq: Two times a day (BID) | ORAL | 0 refills | Status: DC | PRN

## 2018-05-01 NOTE — Progress Notes (Signed)
START ON PATHWAY REGIMEN - Non-Small Cell Lung     A cycle is every 14 days:     Durvalumab   **Always confirm dose/schedule in your pharmacy ordering system**  Patient Characteristics: Stage III - Unresectable, PS = 0, 1 AJCC T Category: T4 Current Disease Status: No Distant Mets or Local Recurrence AJCC N Category: N0 AJCC M Category: M0 AJCC 8 Stage Grouping: IIIA Performance Status: PS = 0, 1 Intent of Therapy: Non-Curative / Palliative Intent, Discussed with Patient

## 2018-05-01 NOTE — Progress Notes (Signed)
[place], [place] [address]   CLINIC:  Medical Oncology/Hematology  PCP:  Vidal Schwalbe, MD [address] [place] [address] [phone]   REASON FOR VISIT:  Follow-up for right lung cancer.  CURRENT THERAPY: Initiation of immunotherapy.  BRIEF ONCOLOGIC HISTORY:    Squamous cell carcinoma of right lung ([place])   12/13/2017 PET scan    IMPRESSION: 1. 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 There is early bony invasion of the right second rib and cortical destruction indicating local rib/chest wall invasion by tumor. 2. There is additional pleural thickening and pleural calcification along the right posterior pleural surface which is not separately hypermetabolic. 3. Small mediastinal lymph nodes are not appreciably hypermetabolic. 4. Multifocal and some diffuse regions of high activity in the bowel are likely physiologic. 5. No findings of metastatic disease to the neck, abdomen/pelvis, or to the included remainder of the skeleton. 6. Other imaging findings of potential clinical significance: Chronic right maxillary sinusitis. Aortic Atherosclerosis (ICD10-I70.0) and Emphysema (ICD10-J43.9). Chronic calcific pancreatitis.       01/15/2018 Initial Diagnosis    Squamous cell carcinoma of right lung ([place])      01/15/2018 Initial Biopsy    Lung needle core biopsy RUL: Squamous cell carcinoma      02/19/2018 Procedure    Port-a-cath placement by Dr. Arnoldo Morale       02/20/2018 -  Chemotherapy    The patient had palonosetron (ALOXI) injection 0.25 mg, 0.25 mg, Intravenous,  Once, 1 of 4 cycles Administration: 0.25 mg (02/20/2018) CARBOplatin (PARAPLATIN) 230 mg in sodium chloride 0.9 % 250 mL chemo infusion, 230 mg (100 % of original dose 225.8 mg), Intravenous,  Once, 1 of 4 cycles Dose modification:   (original dose 225.8 mg, Cycle  1) PACLitaxel (TAXOL) 78 mg in dextrose 5 % 250 mL chemo infusion (</= 80mg /m2), 45 mg/m2 = 78 mg, Intravenous,  Once, 1 of 4 cycles Administration: 78 mg (02/20/2018)  for chemotherapy treatment.       05/06/2018 -  Chemotherapy    The patient had durvalumab (IMFINZI) 620 mg in sodium chloride 0.9 % 100 mL chemo infusion, 10 mg/kg, Intravenous,  Once, 0 of 6 cycles  for chemotherapy treatment.        Adenocarcinoma of prostate ([place])   10/24/2015 Tumor Marker    PSA 1.53      01/06/2016 Tumor Marker    PSA 1.6      07/31/2017 Tumor Marker    PSA 4.6      01/17/2018 Initial Diagnosis    Adenocarcinoma of prostate ([place])      01/17/2018 Procedure    12-core prostate biopsy revealed 6/12 cores positive for prostate adenocarcinoma (Dr. [facility]).  (L) base lat: Gleason 9 (5+4), 20%. (L) mid lat: Gleason 9 (4+5), 40%. (L) apex lat: Gleason 9 (4+5), 20%. (L) base: Gleason 9 (5+4), 20%. (L) mid: Gleason 10 (5+5), 5%. (L) apex: Gleason 9 (5+4), 9%.          CANCER STAGING: Cancer Staging No matching staging information was found for the patient.   INTERVAL HISTORY:  Mr. Runco 62 y.o. male returns for follow-up of his CT scan results.  He completed chemoradiation therapy on 04/04/2018.  His swallowing ability has improved.  He is able to eat all sorts of foods without any problems.  He gained about 9 pounds since the last 3 weeks.  He complained of worsening of pain in the right shoulder, particularly when he is exposed to cold air from the air conditioner.  He took more of his pain medication.  He requests for refill.  Denies any fevers or infections.  The levels are 50%.    REVIEW OF SYSTEMS:  Review of Systems  Musculoskeletal:       Right shoulder pain worse on exposure to cold air.  All other systems reviewed and are negative.    PAST MEDICAL/SURGICAL HISTORY:  Past Medical History:  Diagnosis Date  . Bursitis   . CAD (coronary artery disease)     s/p  stent placement unknown artery 12/2014,  . Cancer [facility])    prostate cancer  . COPD (chronic obstructive pulmonary disease) ([place])   . Diabetes mellitus ([place])   . GERD (gastroesophageal reflux disease)   . History of prostate cancer   . Hyperlipemia   . Hypertension   . Peripheral arterial disease ([place])    a. h/o stents. b. 03/2016  - successful PTA and covered stenting using overlapping Viabahn covered stents of a long segment in-stent restenosis of previously placed nitinol self expanding stents in the proximal mid and distal right SFA.  . Tobacco abuse    Past Surgical History:  Procedure Laterality Date  . COLONOSCOPY WITH PROPOFOL N/A 11/22/2016   Procedure: COLONOSCOPY WITH PROPOFOL;  Surgeon: [name], MD;  Location[facility] ENDO SU[facility];  Service: Endoscopy;  Laterality: N/A;  10:00 am  . CORONARY STENT PLACEMENT  12-2014   [facility] [employer]  . FEMORAL ARTERY STENT Bilateral 03/2013  . PERIPHERAL VASCULAR CATHETERIZATION N/A 04/04/2016   Procedure: Lower Extremity Angiography;  Surgeon: Lorretta Harp, MD;  Location: [place] CV LAB;  Service: Cardiovascular;  Laterality: N/A;  . PERIPHERAL VASCULAR CATHETERIZATION N/A 04/04/2016   Procedure: Abdominal Aortogram;  Surgeon: Lorretta Harp, MD;  Location: [place] CV LAB;  Service: Cardiovascular;  Laterality: N/A;  . PERIPHERAL VASCULAR CATHETERIZATION  04/04/2016   Procedure: Peripheral Vascular Intervention;  Surgeon: Lorretta Harp, MD;  Location: [place] CV LAB;  Service: Cardiovascular;;  rt SFA stent  . POLYPECTOMY  11/22/2016   Procedure: POLYPECTOMY;  Surgeon: [name], MD;  Location[facility];  Service: Endoscopy;;  descending colon polyp, transverse colon polyp, sigmoid colon polyp, rectal polyp  . PORTACATH PLACEMENT Left 02/19/2018   Procedure: INSERTION PORT-A-CATH;  Surgeon: Aviva Signs, MD;  Location: [facility] ORS;  Service: General;  Laterality: Left;  . STENT PLACEMENT VASCULAR ([place] HX)   2013     SOCIAL HISTORY:  Social History   Socioeconomic History  . Marital status: Single    Spouse name: Not on file  . Number of children: Not on file  . Years of education: Not on file  . Highest education level: Not on file  Occupational History  . Not on file  Social Needs  . Financial resource strain: Not on file  . Food insecurity:    Worry: Not on file    Inability: Not on file  . Transportation needs:    Medical: Not on file    Non-medical: Not on file  Tobacco Use  . Smoking status: Current Every Day Smoker    Packs/day: 0.25    Years: 45.00    Pack years: 11.25    Types: Cigarettes    Start date: 02/12/1971  . Smokeless tobacco: Never Used  Substance and Sexual Activity  . Alcohol use: Not Currently  Alcohol/week: 0.0 oz    Comment: stopped 2 weeks ago.  . Drug use: No  . Sexual activity: Yes  Lifestyle  . Physical activity:    Days per week: Not on file    Minutes per session: Not on file  . Stress: Not on file  Relationships  . Social connections:    Talks on phone: Not on file    Gets together: Not on file    Attends religious service: Not on file    Active member of club or organization: Not on file    Attends meetings of clubs or organizations: Not on file    Relationship status: Not on file  . Intimate partner violence:    Fear of current or ex partner: Not on file    Emotionally abused: Not on file    Physically abused: Not on file    Forced sexual activity: Not on file  Other Topics Concern  . Not on file  Social History Narrative  . Not on file    FAMILY HISTORY:  Family History  Problem Relation Age of Onset  . Diabetes Mother   . Heart disease Mother   . Hypertension Mother   . Healthy Brother   . Other Brother        accident   . Arthritis Sister   . Arthritis Sister   . SIDS Brother   . Colon cancer Neg Hx     CURRENT MEDICATIONS:  Outpatient Encounter Medications as of 05/01/2018  Medication Sig Note  . aspirin 81  MG chewable tablet Chew 81 mg by mouth daily. 02/14/2018: Last dose 02/13/2018. On hold for surgery.  [name] atorvastatin (LIPITOR) 80 MG tablet Take 80 mg by mouth at bedtime.    [name] CARBOPLATIN IV Inject into the vein. 6 weekly treatments with radiation   . cilostazol (PLETAL) 50 MG tablet TAKE 1 TABLET(50 MG) BY MOUTH TWICE DAILY 02/14/2018: Last dose of pletal was 4/16. On hold for surgery.  . Coenzyme Q10 100 MG capsule Take 100 mg by mouth at bedtime.    . Diphenhyd-Hydrocort-Nystatin (FIRST-DUKES MOUTHWASH MT) Take by mouth.   . dronabinol (MARINOL) 5 MG capsule Take 1 capsule (5 mg total) by mouth 2 (two) times daily before a meal.   . FLOVENT HFA 110 MCG/ACT inhaler Inhale 1 puff into the lungs 2 (two) times daily.   . fluticasone (FLONASE) 50 MCG/ACT nasal spray Place 1 spray into both nostrils daily.   [name] ipratropium-albuterol (DUONEB) 0.5-2.5 (3) MG/3ML SOLN Take 3 mLs by nebulization every 6 (six) hours as needed (for shortness of breath).   . lidocaine-prilocaine (EMLA) cream Apply a quarter size amount to affected area 1 hour prior to coming to chemotherapy.  Do not rub in.  Cover with plastic wrap.   . metFORMIN (GLUCOPHAGE) 500 MG tablet Take 500 mg by mouth 2 (two) times daily.    . metoprolol succinate (TOPROL-XL) 50 MG 24 hr tablet Take 50 mg by mouth at bedtime.    [name] nystatin (MYCOSTATIN) 100000 UNIT/ML suspension    . Omega-3 Fatty Acids (FISH OIL) 1000 MG CAPS Take 1 capsule by mouth 2 (two) times daily.    . ondansetron (ZOFRAN) 8 MG tablet Take 1 tablet (8 mg total) by mouth every 8 (eight) hours as needed for nausea or vomiting.   [name] oxyCODONE-acetaminophen (PERCOCET/ROXICET) 5-325 MG tablet Take 1 tablet by mouth 2 (two) times daily as needed for severe pain.   [name] PACLitaxel (TAXOL IV) Inject into the  vein. 6 weekly treatments with radiation   . pantoprazole (PROTONIX) 40 MG tablet Take 40 mg by mouth 2 (two) times daily.   . prochlorperazine (COMPAZINE) 10 MG tablet Take 1 tablet  (10 mg total) by mouth every 6 (six) hours as needed for nausea or vomiting.   . triamcinolone cream (KENALOG) 0.1 % Apply topically 2 (two) times daily.   . [DISCONTINUED] oxyCODONE-acetaminophen (PERCOCET/ROXICET) 5-325 MG tablet Take 1 tablet by mouth every 6 (six) hours as needed for severe pain.   . [DISCONTINUED] oxyCODONE-acetaminophen (PERCOCET/ROXICET) 5-325 MG tablet Take 1 tablet by mouth 2 (two) times daily as needed for severe pain.    No facility-administered encounter medications on file as of 05/01/2018.     ALLERGIES:  Allergies  Allergen Reactions  . Lisinopril Anaphylaxis  . Penicillins Anaphylaxis    Has patient had a PCN reaction causing immediate rash, facial/tongue/throat swelling, SOB or lightheadedness with hypotension:Yes Has patient had a PCN reaction causing severe rash involving mucus membranes or skin necrosis:unsure Has patient had a PCN reaction that required hospitalization:unsure Has patient had a PCN reaction occurring within the last 10 years:~10 years per patient If all of the above answers are "NO", then may proceed with Cephalosporin use.   . Ace Inhibitors Swelling     PHYSICAL EXAM:  ECOG Performance status: 1  Vitals:   05/01/18 1602  BP: 114/63  Pulse: (!) 103  Resp: 18  Temp: 98.2 F (36.8 C)  SpO2: 100%   Filed Weights   05/01/18 1602  Weight: 137 lb 8 oz (62.4 kg)    Physical Exam   LABORATORY DATA:  I have reviewed the labs as listed.  CBC    Component Value Date/Time   WBC 8.0 04/27/2018 1004   RBC 3.57 (L) 04/27/2018 1004   HGB 10.8 (L) 04/27/2018 1004   HCT 33.5 (L) 04/27/2018 1004   PLT 275 04/27/2018 1004   MCV 93.8 04/27/2018 1004   MCH 30.3 04/27/2018 1004   MCHC 32.2 04/27/2018 1004   RDW 18.5 (H) 04/27/2018 1004   LYMPHSABS 1.0 04/27/2018 1004   MONOABS 0.7 04/27/2018 1004   EOSABS 0.2 04/27/2018 1004   BASOSABS 0.0 04/27/2018 1004   CMP Latest Ref Rng & Units 04/27/2018 07/05/2018 03/20/2018  Glucose 70  - 99 mg/dL 108(H) 120(H) 131(H)  BUN 8 - 23 mg/dL 14 15 13   Creatinine 0.61 - 1.24 mg/dL 0.52(L) 0.60(L) 0.54(L)  Sodium 135 - 145 mmol/L 140 138 133(L)  Potassium 3.5 - 5.1 mmol/L 4.7 4.9 4.2  Chloride 98 - 111 mmol/L 109 102 98(L)  CO2 22 - 32 mmol/L 24 25 23   Calcium 8.9 - 10.3 mg/dL 9.7 9.7 9.5  Total Protein 6.5 - 8.1 g/dL 7.3 8.0 7.9  Total Bilirubin 0.3 - 1.2 mg/dL 0.3 0.5 0.6  Alkaline Phos 38 - 126 U/L 83 106 122  AST 15 - 41 U/L 17 17 19   ALT 0 - 44 U/L 18 22 29        DIAGNOSTIC IMAGING:  I have independently reviewed the images of the CT scan dated 04/27/2018 and reviewed the results with the patient.    ASSESSMENT & PLAN:   Squamous cell carcinoma of right lung ([place]) 1.  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 -Patient evaluated by Dr. Roxan Hockey and was recommended to have preoperative chemoradiation therapy. -Chemoradiation therapy with carboplatin and paclitaxel from  02/20/2018 through 04/04/2018. -I have reviewed the results of the CT scan of the chest dated 04/27/2018 which showed regression of the tumor which is sub-solid in appearance.  No new areas were seen.  I have recommended consolidation with Durvalumab given every 2 weeks for a total of 12 months.  We talked about the side effects of immunotherapy in detail.  He gives [place] permission to proceed with the treatment.  We plan to start next week.  We will continue imaging every 2 to 3 months while he is receiving consolidation therapy.  2.  Weight loss: He is able to eat well.  He gained back about 9 pounds since finishing chemoradiation therapy.  3.  Recurrent prostate cancer: He had a history of prostate cancer and reportedly received radiation therapy 6-7 years ago.  He had prostate biopsies on 01/17/2018 which showed recurrence with a Gleason score (5+5)=10.  He will be treated with GnRH  agonist.  4.  Cancer related pain: -Complains of right upper back/shoulder pain.  Cold air from the air conditioning at nighttime is making the pain was for the last few weeks.  I have given a refill for Percocet to be taken twice daily as needed.  He was told to use local measures like heating pad.      Orders placed this encounter:  Orders Placed This Encounter  Procedures  . CBC with Differential/Platelet  . Comprehensive metabolic panel      Derek Jack, MD [place] [phone]

## 2018-05-01 NOTE — Assessment & Plan Note (Signed)
1.  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 -Patient evaluated by Dr. Roxan Hockey and was recommended to have preoperative chemoradiation therapy. -Chemoradiation therapy with carboplatin and paclitaxel from 02/20/2018 through 04/04/2018. -I have reviewed the results of the CT scan of the chest dated 04/27/2018 which showed regression of the tumor which is sub-solid in appearance.  No new areas were seen.  I have recommended consolidation with Durvalumab given every 2 weeks for a total of 12 months.  We talked about the side effects of immunotherapy in detail.  He gives [place] permission to proceed with the treatment.  We plan to start next week.  We will continue imaging every 2 to 3 months while he is receiving consolidation therapy.  2.  Weight loss: He is able to eat well.  He gained back about 9 pounds since finishing chemoradiation therapy.  3.  Recurrent prostate cancer: He had a history of prostate cancer and reportedly received radiation therapy 6-7 years ago.  He had prostate biopsies on 01/17/2018 which showed recurrence with a Gleason score (5+5)=10.  He will be treated with GnRH agonist.  4.  Cancer related pain: -Complains of right upper back/shoulder pain.  Cold air from the air conditioning at nighttime is making the pain was for the last few weeks.  I have given a refill for Percocet to be taken twice daily as needed.  He was told to use local measures like heating pad.

## 2018-05-07 ENCOUNTER — Ambulatory Visit (HOSPITAL_COMMUNITY): Payer: Medicare Other

## 2018-05-11 ENCOUNTER — Ambulatory Visit (HOSPITAL_COMMUNITY): Payer: Medicare Other

## 2018-05-11 ENCOUNTER — Inpatient Hospital Stay (HOSPITAL_COMMUNITY): Payer: Medicare Other

## 2018-05-11 ENCOUNTER — Encounter (HOSPITAL_COMMUNITY): Payer: Self-pay

## 2018-05-11 ENCOUNTER — Other Ambulatory Visit (HOSPITAL_COMMUNITY): Payer: Medicare Other

## 2018-05-11 ENCOUNTER — Encounter (HOSPITAL_COMMUNITY): Payer: Medicare Other

## 2018-05-11 VITALS — BP 123/75 | HR 92 | Temp 98.5°F | Resp 18 | Wt 140.6 lb

## 2018-05-11 DIAGNOSIS — C3491 Malignant neoplasm of unspecified part of right bronchus or lung: Secondary | ICD-10-CM

## 2018-05-11 DIAGNOSIS — C3411 Malignant neoplasm of upper lobe, right bronchus or lung: Secondary | ICD-10-CM | POA: Diagnosis not present

## 2018-05-11 LAB — COMPREHENSIVE METABOLIC PANEL
ALT: 20 U/L (ref 0–44)
ANION GAP: 9 (ref 5–15)
AST: 16 U/L (ref 15–41)
Albumin: 3.6 g/dL (ref 3.5–5.0)
Alkaline Phosphatase: 97 U/L (ref 38–126)
BUN: 12 mg/dL (ref 8–23)
CALCIUM: 9.5 mg/dL (ref 8.9–10.3)
CO2: 25 mmol/L (ref 22–32)
CREATININE: 0.61 mg/dL (ref 0.61–1.24)
Chloride: 107 mmol/L (ref 98–111)
Glucose, Bld: 132 mg/dL — ABNORMAL HIGH (ref 70–99)
Potassium: 4.9 mmol/L (ref 3.5–5.1)
SODIUM: 141 mmol/L (ref 135–145)
Total Bilirubin: 0.4 mg/dL (ref 0.3–1.2)
Total Protein: 7.3 g/dL (ref 6.5–8.1)

## 2018-05-11 LAB — CBC WITH DIFFERENTIAL/PLATELET
Basophils Absolute: 0 10*3/uL (ref 0.0–0.1)
Basophils Relative: 0 %
EOS PCT: 3 %
Eosinophils Absolute: 0.3 10*3/uL (ref 0.0–0.7)
HCT: 33.6 % — ABNORMAL LOW (ref 39.0–52.0)
HEMOGLOBIN: 10.7 g/dL — AB (ref 13.0–17.0)
LYMPHS ABS: 0.7 10*3/uL (ref 0.7–4.0)
LYMPHS PCT: 7 %
MCH: 30.2 pg (ref 26.0–34.0)
MCHC: 31.8 g/dL (ref 30.0–36.0)
MCV: 94.9 fL (ref 78.0–100.0)
MONOS PCT: 10 %
Monocytes Absolute: 1.1 10*3/uL — ABNORMAL HIGH (ref 0.1–1.0)
Neutro Abs: 9 10*3/uL — ABNORMAL HIGH (ref 1.7–7.7)
Neutrophils Relative %: 80 %
PLATELETS: 331 10*3/uL (ref 150–400)
RBC: 3.54 MIL/uL — AB (ref 4.22–5.81)
RDW: 18 % — ABNORMAL HIGH (ref 11.5–15.5)
WBC: 11.1 10*3/uL — AB (ref 4.0–10.5)

## 2018-05-11 MED ORDER — HEPARIN SOD (PORK) LOCK FLUSH 100 UNIT/ML IV SOLN
500.0000 [IU] | Freq: Once | INTRAVENOUS | Status: AC | PRN
  Administered 2018-05-11: 500 [IU]
  Filled 2018-05-11: qty 5

## 2018-05-11 MED ORDER — SODIUM CHLORIDE 0.9 % IV SOLN
Freq: Once | INTRAVENOUS | Status: AC
  Administered 2018-05-11: 10:00:00 via INTRAVENOUS

## 2018-05-11 MED ORDER — SODIUM CHLORIDE 0.9% FLUSH
10.0000 mL | INTRAVENOUS | Status: DC | PRN
  Administered 2018-05-11: 10 mL
  Filled 2018-05-11: qty 10

## 2018-05-11 MED ORDER — SODIUM CHLORIDE 0.9 % IV SOLN
10.0000 mg/kg | Freq: Once | INTRAVENOUS | Status: AC
  Administered 2018-05-11: 620 mg via INTRAVENOUS
  Filled 2018-05-11: qty 2.4

## 2018-05-11 NOTE — Progress Notes (Signed)
Nutrition Follow-up  Patient with lung cancer. Has completed radiation and chemotherapy.  Starting immunotherapy today.    Met with patient and caregiver today during infusion.  Patient reports his appetite has come back and "I am walking and eating anything I want."  Reports this am ate black eyed peas, biscuit, coffee and ensure.  Yesterday am ate egg with mayo sandwich, coffee, ensure. Lunch yesterday ate tomato sandwich with mayo, watermelon and ensure.  Supper was microwave meal of salisbury steak, mashed potatoes, corn and apples, drank orange soda.  Reports around 2am ate watermelon.  Reports that he is drinking about 2-3 ensure per day.  Does not really have a taste for meat (still eats it) or salt.    No other nutrition impact symptoms reported today.   Wt Readings from Last 10 Encounters:  05/11/18 140 lb 9.6 oz (63.8 kg)  05/01/18 137 lb 8 oz (62.4 kg)  04/18/18 132 lb (59.9 kg)  03/27/18 129 lb (58.5 kg)  03/20/18 130 lb 9.6 oz (59.2 kg)  03/13/18 130 lb 9.6 oz (59.2 kg)  03/06/18 131 lb 12.8 oz (59.8 kg)  02/27/18 138 lb 8 oz (62.8 kg)  02/20/18 143 lb (64.9 kg)  02/19/18 145 lb (65.8 kg)   Weight has increased from last weight of 130 lb when seen by RD on 5/21.    MEDICATIONS: reviewed   LABS: reviewed   ANTHROPOMETRICS: Height:  Ht Readings from Last 1 Encounters:  04/18/18 5' 8"  (1.727 m)   Weight:  Wt Readings from Last 1 Encounters:  05/11/18 140 lb 9.6 oz (63.8 kg)   BMI:  BMI Readings from Last 1 Encounters:  05/11/18 21.38 kg/m     ESTIMATED ENERGY NEEDS:  Kcal: 2100 calories Protein:90 g  Fluid: 2.1 L/d    NUTRITION DIAGNOSIS:  Severe malnutrition improving with weight gain   DOCUMENTATION CODES:    Severe malnutrition improving  INTERVENTION:   Reviewed foods with increased calories and protein with patient.  Encouraged adding protein source with tomato sandwich meal. Provided another case of ensure enlive with coupons today  GOAL:   Weight gain   MONITOR:  PO intake, Weight trends   Next Visit: to be determined with next treatment   Blakleigh Straw B. Zenia Resides, [place], [place] Registered Dietitian [phone] (pager)

## 2018-05-11 NOTE — Progress Notes (Signed)
0945 Lab results and pt's temperature reviewed with Dr. Delton Coombes and pt approved for Imfinzi infusion today per MD                                                                  Fred Hernandez tolerated Imfinzi infusion well without complaints or incident. Instructed pt and family member regarding this new medication and both verbalized understanding. VSS upon discharge. Pt discharged self ambulatory in satisfactory condition accompanied by family member

## 2018-05-11 NOTE — Patient Instructions (Signed)
[facility] Discharge Instructions for Patients Receiving Chemotherapy   Beginning January 23rd 2017 lab work for the [facility] will be done in the  Main lab at [facility] on 1st floor. If you have a lab appointment with the [facility] please come in thru the  Main Entrance and check in at the main information desk   Today you received the following chemotherapy agents Imfinzi. Follow-up as scheduled. Call clinic for any questions or concerns  To help prevent nausea and vomiting after your treatment, we encourage you to take your nausea medication   If you develop nausea and vomiting, or diarrhea that is not controlled by your medication, call the clinic.  The clinic phone number is [phone]. Office hours are Monday-Friday 8:30am-5:00pm.  BELOW ARE SYMPTOMS THAT SHOULD BE REPORTED IMMEDIATELY:  *FEVER GREATER THAN 101.0 F  *CHILLS WITH OR WITHOUT FEVER  NAUSEA AND VOMITING THAT IS NOT CONTROLLED WITH YOUR NAUSEA MEDICATION  *UNUSUAL SHORTNESS OF BREATH  *UNUSUAL BRUISING OR BLEEDING  TENDERNESS IN MOUTH AND THROAT WITH OR WITHOUT PRESENCE OF ULCERS  *URINARY PROBLEMS  *BOWEL PROBLEMS  UNUSUAL RASH Items with * indicate a potential emergency and should be followed up as soon as possible. If you have an emergency after office hours please contact your primary care physician or go to the nearest emergency department.  Please call the clinic during office hours if you have any questions or concerns.   You may also contact the Patient Navigator at [phone] should you have any questions or need assistance in obtaining follow up care.      Resources For Cancer Patients and their Caregivers ? American Cancer Society: Can assist with transportation, wigs, general needs, runs Look Good Feel Better.        769 235 8261 ? Cancer Care: Provides financial assistance, online support groups, medication/co-pay assistance.  1-800-813-HOPE  305-562-7817) ? [facility] Assists [place] cancer patients and their families through emotional , educational and financial support.  6146714500 ? [place] DSS Where to apply for food stamps, Medicaid and utility assistance. 651-561-3409 ? [facility]: Transportation to medical appointments. (647) 596-9177 ? Social Security Administration: May apply for disability if have a Stage IV cancer. 408-012-7571 231-377-2845 ? [employer], Disability and Transit Services: Assists with nutrition, care and transit needs. [phone]

## 2018-05-17 ENCOUNTER — Encounter: Payer: Self-pay | Admitting: Orthopaedic Surgery

## 2018-05-17 ENCOUNTER — Ambulatory Visit (INDEPENDENT_AMBULATORY_CARE_PROVIDER_SITE_OTHER): Payer: Medicare Other | Admitting: Orthopaedic Surgery

## 2018-05-17 VITALS — BP 135/81 | HR 97 | Ht 68.0 in | Wt 140.0 lb

## 2018-05-17 DIAGNOSIS — M25511 Pain in right shoulder: Secondary | ICD-10-CM

## 2018-05-17 NOTE — Progress Notes (Signed)
PROCEDURE NOTE:  The patient request injection, verbal consent was obtained.  The right shoulder was prepped appropriately after time out was performed.   Sterile technique was observed and injection of 1 cc of Depo-Medrol 40 mg with several cc's of plain xylocaine. Anesthesia was provided by ethyl chloride and a 20-gauge needle was used to inject the shoulder area. A posterior approach was used.  The injection was tolerated well.  A band aid dressing was applied.  The patient was advised to apply ice later today and tomorrow to the injection sight as needed.  Return in one month. Call if any problem.  Precautions discussed.   Electronically Signed Sanjuana Kava, MD 7/18/201910:11 AM

## 2018-05-25 ENCOUNTER — Other Ambulatory Visit: Payer: Self-pay

## 2018-05-25 ENCOUNTER — Inpatient Hospital Stay (HOSPITAL_COMMUNITY): Payer: Medicare Other

## 2018-05-25 ENCOUNTER — Other Ambulatory Visit: Payer: Self-pay | Admitting: Cardiovascular Disease

## 2018-05-25 ENCOUNTER — Encounter (HOSPITAL_COMMUNITY): Payer: Self-pay

## 2018-05-25 VITALS — BP 112/62 | HR 94 | Temp 97.2°F | Resp 18 | Wt 139.6 lb

## 2018-05-25 DIAGNOSIS — C3411 Malignant neoplasm of upper lobe, right bronchus or lung: Secondary | ICD-10-CM | POA: Diagnosis not present

## 2018-05-25 DIAGNOSIS — C3491 Malignant neoplasm of unspecified part of right bronchus or lung: Secondary | ICD-10-CM

## 2018-05-25 DIAGNOSIS — I739 Peripheral vascular disease, unspecified: Secondary | ICD-10-CM

## 2018-05-25 LAB — COMPREHENSIVE METABOLIC PANEL
ALK PHOS: 84 U/L (ref 38–126)
ALT: 19 U/L (ref 0–44)
AST: 16 U/L (ref 15–41)
Albumin: 3.9 g/dL (ref 3.5–5.0)
Anion gap: 9 (ref 5–15)
BILIRUBIN TOTAL: 0.3 mg/dL (ref 0.3–1.2)
BUN: 13 mg/dL (ref 8–23)
CALCIUM: 9.9 mg/dL (ref 8.9–10.3)
CO2: 23 mmol/L (ref 22–32)
Chloride: 108 mmol/L (ref 98–111)
Creatinine, Ser: 0.66 mg/dL (ref 0.61–1.24)
GFR calc Af Amer: >60 mL/min (ref >60)
GFR calc non Af Amer: >60 mL/min (ref >60)
Glucose, Bld: 115 mg/dL — ABNORMAL HIGH (ref 70–99)
Potassium: 5 mmol/L (ref 3.5–5.1)
Sodium: 140 mmol/L (ref 135–145)
TOTAL PROTEIN: 8.2 g/dL — AB (ref 6.5–8.1)

## 2018-05-25 LAB — CBC WITH DIFFERENTIAL/PLATELET
Basophils Absolute: 0 10*3/uL (ref 0.0–0.1)
Basophils Relative: 0 %
Eosinophils Absolute: 0.3 10*3/uL (ref 0.0–0.7)
Eosinophils Relative: 3 %
HEMATOCRIT: 36.4 % — AB (ref 39.0–52.0)
HEMOGLOBIN: 12 g/dL — AB (ref 13.0–17.0)
LYMPHS ABS: 1.3 10*3/uL (ref 0.7–4.0)
Lymphocytes Relative: 11 %
MCH: 30.6 pg (ref 26.0–34.0)
MCHC: 33 g/dL (ref 30.0–36.0)
MCV: 92.9 fL (ref 78.0–100.0)
Monocytes Absolute: 0.8 10*3/uL (ref 0.1–1.0)
Monocytes Relative: 7 %
Neutro Abs: 9.2 10*3/uL — ABNORMAL HIGH (ref 1.7–7.7)
Neutrophils Relative %: 79 %
Platelets: 342 10*3/uL (ref 150–400)
RBC: 3.92 MIL/uL — ABNORMAL LOW (ref 4.22–5.81)
RDW: 17.3 % — AB (ref 11.5–15.5)
WBC: 11.7 10*3/uL — ABNORMAL HIGH (ref 4.0–10.5)

## 2018-05-25 MED ORDER — SODIUM CHLORIDE 0.9 % IV SOLN
Freq: Once | INTRAVENOUS | Status: AC
  Administered 2018-05-25: 10:00:00 via INTRAVENOUS

## 2018-05-25 MED ORDER — OXYCODONE-ACETAMINOPHEN 5-325 MG PO TABS
1.0000 | ORAL_TABLET | Freq: Once | ORAL | Status: AC
  Administered 2018-05-25: 1 via ORAL

## 2018-05-25 MED ORDER — SODIUM CHLORIDE 0.9 % IV SOLN
10.0000 mg/kg | Freq: Once | INTRAVENOUS | Status: AC
  Administered 2018-05-25: 620 mg via INTRAVENOUS
  Filled 2018-05-25: qty 2.4

## 2018-05-25 MED ORDER — HEPARIN SOD (PORK) LOCK FLUSH 100 UNIT/ML IV SOLN
500.0000 [IU] | Freq: Once | INTRAVENOUS | Status: AC | PRN
  Administered 2018-05-25: 500 [IU]

## 2018-05-25 NOTE — Progress Notes (Signed)
Nutrition Follow-up:  Patient with lung cancer.  Patient currently receiving immunotherapy.    Met with patient and caregiver today in infusion.  Patient reports appetite has been good.  Has been continuing to eat any consistency foods (tenderloin, pinto beans, fried potatoes and onions, watermelon, egg sandwich).  Reports that he is still trying to drink ensure daily to maintain weight.    Reports that shoulder is hurting today.   Medications: reviewed  Labs: reviewed  Anthropometrics:   Weight 139 lb 9.6 oz today slight decrease from 140 lb 9.6 oz on 7/12   NUTRITION DIAGNOSIS:  Severe malnutrition stable   MALNUTRITION DIAGNOSIS: severe malnutrition stable   INTERVENTION:  Encouraged patient to continue drinking shakes for added nutrition. Provided another case of ensure enlive today. Reviewed foods that are high in calories and protein    MONITORING, EVALUATION, GOAL: weight trends, po intake   NEXT VISIT: to be determined  Matheo Rathbone B. Zenia Resides, [place], [place] Registered Dietitian [phone] (pager)

## 2018-05-25 NOTE — Progress Notes (Signed)
1035 - Pt now c/o right shoulder pain, reports 8/10.  Requesting pain medication.  Dr. Delton Coombes notified and order rec'd for Percocet 5/325 mg 1 tablet x 1 dose.   1100 - pt awakened to medicate for pain.   Tolerated infusion w/o adverse reaction.  Alert, in no distress.  VSS.  Discharged ambulatory in c/o family.

## 2018-06-06 ENCOUNTER — Other Ambulatory Visit (HOSPITAL_COMMUNITY): Payer: Self-pay

## 2018-06-06 DIAGNOSIS — C3491 Malignant neoplasm of unspecified part of right bronchus or lung: Secondary | ICD-10-CM

## 2018-06-06 DIAGNOSIS — Z5111 Encounter for antineoplastic chemotherapy: Secondary | ICD-10-CM

## 2018-06-08 ENCOUNTER — Other Ambulatory Visit: Payer: Self-pay

## 2018-06-08 ENCOUNTER — Encounter (HOSPITAL_COMMUNITY): Payer: Self-pay | Admitting: Hematology

## 2018-06-08 ENCOUNTER — Inpatient Hospital Stay (HOSPITAL_COMMUNITY): Payer: Medicare Other

## 2018-06-08 ENCOUNTER — Inpatient Hospital Stay (HOSPITAL_COMMUNITY): Payer: Medicare Other | Attending: Internal Medicine | Admitting: Hematology

## 2018-06-08 VITALS — BP 104/65 | HR 103 | Temp 98.2°F | Resp 18 | Wt 143.8 lb

## 2018-06-08 DIAGNOSIS — J439 Emphysema, unspecified: Secondary | ICD-10-CM | POA: Diagnosis not present

## 2018-06-08 DIAGNOSIS — G893 Neoplasm related pain (acute) (chronic): Secondary | ICD-10-CM | POA: Diagnosis not present

## 2018-06-08 DIAGNOSIS — C3491 Malignant neoplasm of unspecified part of right bronchus or lung: Secondary | ICD-10-CM

## 2018-06-08 DIAGNOSIS — E119 Type 2 diabetes mellitus without complications: Secondary | ICD-10-CM | POA: Insufficient documentation

## 2018-06-08 DIAGNOSIS — K219 Gastro-esophageal reflux disease without esophagitis: Secondary | ICD-10-CM

## 2018-06-08 DIAGNOSIS — C3411 Malignant neoplasm of upper lobe, right bronchus or lung: Secondary | ICD-10-CM | POA: Insufficient documentation

## 2018-06-08 DIAGNOSIS — I10 Essential (primary) hypertension: Secondary | ICD-10-CM | POA: Diagnosis not present

## 2018-06-08 DIAGNOSIS — K861 Other chronic pancreatitis: Secondary | ICD-10-CM | POA: Insufficient documentation

## 2018-06-08 DIAGNOSIS — E785 Hyperlipidemia, unspecified: Secondary | ICD-10-CM | POA: Diagnosis not present

## 2018-06-08 DIAGNOSIS — Z79899 Other long term (current) drug therapy: Secondary | ICD-10-CM | POA: Diagnosis not present

## 2018-06-08 DIAGNOSIS — Z5112 Encounter for antineoplastic immunotherapy: Secondary | ICD-10-CM | POA: Insufficient documentation

## 2018-06-08 DIAGNOSIS — I2510 Atherosclerotic heart disease of native coronary artery without angina pectoris: Secondary | ICD-10-CM | POA: Diagnosis not present

## 2018-06-08 DIAGNOSIS — C61 Malignant neoplasm of prostate: Secondary | ICD-10-CM | POA: Diagnosis not present

## 2018-06-08 DIAGNOSIS — I700 Atherosclerosis of aorta: Secondary | ICD-10-CM | POA: Diagnosis not present

## 2018-06-08 DIAGNOSIS — Z5111 Encounter for antineoplastic chemotherapy: Secondary | ICD-10-CM

## 2018-06-08 DIAGNOSIS — J320 Chronic maxillary sinusitis: Secondary | ICD-10-CM | POA: Diagnosis not present

## 2018-06-08 DIAGNOSIS — F17210 Nicotine dependence, cigarettes, uncomplicated: Secondary | ICD-10-CM | POA: Insufficient documentation

## 2018-06-08 DIAGNOSIS — R634 Abnormal weight loss: Secondary | ICD-10-CM | POA: Diagnosis not present

## 2018-06-08 LAB — COMPREHENSIVE METABOLIC PANEL
ALK PHOS: 87 U/L (ref 38–126)
ALT: 26 U/L (ref 0–44)
AST: 25 U/L (ref 15–41)
Albumin: 3.5 g/dL (ref 3.5–5.0)
Anion gap: 10 (ref 5–15)
BUN: 13 mg/dL (ref 8–23)
CALCIUM: 9.7 mg/dL (ref 8.9–10.3)
CHLORIDE: 110 mmol/L (ref 98–111)
CO2: 20 mmol/L — AB (ref 22–32)
CREATININE: 0.68 mg/dL (ref 0.61–1.24)
GFR calc non Af Amer: >60 mL/min (ref >60)
GLUCOSE: 112 mg/dL — AB (ref 70–99)
Potassium: 4.4 mmol/L (ref 3.5–5.1)
SODIUM: 140 mmol/L (ref 135–145)
Total Bilirubin: 0.3 mg/dL (ref 0.3–1.2)
Total Protein: 8.1 g/dL (ref 6.5–8.1)

## 2018-06-08 LAB — CBC WITH DIFFERENTIAL/PLATELET
Basophils Absolute: 0 10*3/uL (ref 0.0–0.1)
Basophils Relative: 0 %
EOS ABS: 0.4 10*3/uL (ref 0.0–0.7)
EOS PCT: 3 %
HCT: 35.6 % — ABNORMAL LOW (ref 39.0–52.0)
HEMOGLOBIN: 11.8 g/dL — AB (ref 13.0–17.0)
LYMPHS ABS: 1.5 10*3/uL (ref 0.7–4.0)
Lymphocytes Relative: 12 %
MCH: 30.6 pg (ref 26.0–34.0)
MCHC: 33.1 g/dL (ref 30.0–36.0)
MCV: 92.2 fL (ref 78.0–100.0)
Monocytes Absolute: 0.9 10*3/uL (ref 0.1–1.0)
Monocytes Relative: 7 %
NEUTROS PCT: 78 %
Neutro Abs: 10.1 10*3/uL — ABNORMAL HIGH (ref 1.7–7.7)
PLATELETS: 341 10*3/uL (ref 150–400)
RBC: 3.86 MIL/uL — AB (ref 4.22–5.81)
RDW: 16.8 % — ABNORMAL HIGH (ref 11.5–15.5)
WBC: 12.9 10*3/uL — AB (ref 4.0–10.5)

## 2018-06-08 MED ORDER — SODIUM CHLORIDE 0.9 % IV SOLN
Freq: Once | INTRAVENOUS | Status: AC
  Administered 2018-06-08: 10:00:00 via INTRAVENOUS

## 2018-06-08 MED ORDER — SODIUM CHLORIDE 0.9% FLUSH
10.0000 mL | INTRAVENOUS | Status: DC | PRN
  Administered 2018-06-08: 10 mL
  Filled 2018-06-08: qty 10

## 2018-06-08 MED ORDER — HEPARIN SOD (PORK) LOCK FLUSH 100 UNIT/ML IV SOLN
500.0000 [IU] | Freq: Once | INTRAVENOUS | Status: AC | PRN
  Administered 2018-06-08: 500 [IU]

## 2018-06-08 MED ORDER — SODIUM CHLORIDE 0.9 % IV SOLN
10.0000 mg/kg | Freq: Once | INTRAVENOUS | Status: AC
  Administered 2018-06-08: 620 mg via INTRAVENOUS
  Filled 2018-06-08: qty 2.4

## 2018-06-08 NOTE — Progress Notes (Signed)
Fred Hernandez,  [id number]   CLINIC:  Medical Oncology/Hematology  PCP:  Vidal Schwalbe, MD [address] [place] [address] [phone]   REASON FOR VISIT: Follow-up for right lung cancer  CURRENT THERAPY: Imfinzi/durvalumab every 2 weeks for 12 months  BRIEF ONCOLOGIC HISTORY:    Squamous cell carcinoma of right lung ([place])   12/13/2017 PET scan    IMPRESSION: 1. 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 There is early bony invasion of the right second rib and cortical destruction indicating local rib/chest wall invasion by tumor. 2. There is additional pleural thickening and pleural calcification along the right posterior pleural surface which is not separately hypermetabolic. 3. Small mediastinal lymph nodes are not appreciably hypermetabolic. 4. Multifocal and some diffuse regions of high activity in the bowel are likely physiologic. 5. No findings of metastatic disease to the neck, abdomen/pelvis, or to the included remainder of the skeleton. 6. Other imaging findings of potential clinical significance: Chronic right maxillary sinusitis. Aortic Atherosclerosis (ICD10-I70.0) and Emphysema (ICD10-J43.9). Chronic calcific pancreatitis.     01/15/2018 Initial Diagnosis    Squamous cell carcinoma of right lung ([place])    01/15/2018 Initial Biopsy    Lung needle core biopsy RUL: Squamous cell carcinoma    02/19/2018 Procedure    Port-a-cath placement by Dr. Arnoldo Morale     02/20/2018 -  Chemotherapy    The patient had palonosetron (ALOXI) injection 0.25 mg, 0.25 mg, Intravenous,  Once, 1 of 4 cycles Administration: 0.25 mg (02/20/2018) CARBOplatin (PARAPLATIN) 230 mg in sodium chloride 0.9 % 250 mL chemo infusion, 230 mg (100 % of original dose 225.8 mg), Intravenous,  Once, 1 of 4 cycles Dose modification:   (original dose 225.8 mg, Cycle  1) PACLitaxel (TAXOL) 78 mg in dextrose 5 % 250 mL chemo infusion (</= 80mg /m2), 45 mg/m2 = 78 mg, Intravenous,  Once, 1 of 4 cycles Administration: 78 mg (02/20/2018)  for chemotherapy treatment.     05/06/2018 -  Chemotherapy    The patient had durvalumab (IMFINZI) 620 mg in sodium chloride 0.9 % 100 mL chemo infusion, 10 mg/kg = 620 mg, Intravenous,  Once, 2 of 12 cycles Administration: 620 mg (05/11/2018), 620 mg (05/25/2018)  for chemotherapy treatment.      Adenocarcinoma of prostate ([place])   10/24/2015 Tumor Marker    PSA 1.53    01/06/2016 Tumor Marker    PSA 1.6    07/31/2017 Tumor Marker    PSA 4.6    01/17/2018 Initial Diagnosis    Adenocarcinoma of prostate ([place])    01/17/2018 Procedure    12-core prostate biopsy revealed 6/12 cores positive for prostate adenocarcinoma (Dr. [facility]).  (L) base lat: Gleason 9 (5+4), 20%. (L) mid lat: Gleason 9 (4+5), 40%. (L) apex lat: Gleason 9 (4+5), 20%. (L) base: Gleason 9 (5+4), 20%. (L) mid: Gleason 10 (5+5), 5%. (L) apex: Gleason 9 (5+4), 9%.          INTERVAL HISTORY:  Fred Hernandez 62 y.o. male returns for routine follow-up lung cancer. Patient is here this morning with his friend. He has been doing well since starting Imfinzi. Patient has occasional headaches. He has SOB with exertion. Patient lives at home with his girlfriend that still smokes around him. He tries to remain active. He doesn't need help with daily activities. Denies any new cough. Denies any nausea,vomiting, or diarrhea. His appetite is at  50% and he drinks 3 ensure a day to help maintain his weight. His energy levels at 75%.     REVIEW OF SYSTEMS:  Review of Systems  Respiratory: Positive for shortness of breath.   All other systems reviewed and are negative.    PAST MEDICAL/SURGICAL HISTORY:  Past Medical History:  Diagnosis Date  . Bursitis   . CAD (coronary artery disease)     s/p stent placement unknown artery 12/2014,  . Cancer [facility])     prostate cancer  . COPD (chronic obstructive pulmonary disease) ([place])   . Diabetes mellitus ([place])   . GERD (gastroesophageal reflux disease)   . History of prostate cancer   . Hyperlipemia   . Hypertension   . Peripheral arterial disease ([place])    a. h/o stents. b. 03/2016  - successful PTA and covered stenting using overlapping Viabahn covered stents of a long segment in-stent restenosis of previously placed nitinol self expanding stents in the proximal mid and distal right SFA.  . Tobacco abuse    Past Surgical History:  Procedure Laterality Date  . COLONOSCOPY WITH PROPOFOL N/A 11/22/2016   Procedure: COLONOSCOPY WITH PROPOFOL;  Surgeon: [name], MD;  Location: [facility] ENDO SUITE;  Service: Endoscopy;  Laterality: N/A;  10:00 am  . CORONARY STENT PLACEMENT  12-2014   [facility] [employer]  . FEMORAL ARTERY STENT Bilateral 03/2013  . PERIPHERAL VASCULAR CATHETERIZATION N/A 04/04/2016   Procedure: Lower Extremity Angiography;  Surgeon: Lorretta Harp, MD;  Location: [place] CV LAB;  Service: Cardiovascular;  Laterality: N/A;  . PERIPHERAL VASCULAR CATHETERIZATION N/A 04/04/2016   Procedure: Abdominal Aortogram;  Surgeon: Lorretta Harp, MD;  Location: [place] CV LAB;  Service: Cardiovascular;  Laterality: N/A;  . PERIPHERAL VASCULAR CATHETERIZATION  04/04/2016   Procedure: Peripheral Vascular Intervention;  Surgeon: Lorretta Harp, MD;  Location: [place] CV LAB;  Service: Cardiovascular;;  rt SFA stent  . POLYPECTOMY  11/22/2016   Procedure: POLYPECTOMY;  Surgeon: [name], MD;  Location: [facility];  Service: Endoscopy;;  descending colon polyp, transverse colon polyp, sigmoid colon polyp, rectal polyp  . PORTACATH PLACEMENT Left 02/19/2018   Procedure: INSERTION PORT-A-CATH;  Surgeon: Aviva Signs, MD;  Location: [facility] ORS;  Service: General;  Laterality: Left;  . STENT PLACEMENT VASCULAR ([place] [facility])  2013     SOCIAL HISTORY:  Social History    Socioeconomic History  . Marital status: Single    Spouse name: Not on file  . Number of children: Not on file  . Years of education: Not on file  . Highest education level: Not on file  Occupational History  . Not on file  Social Needs  . Financial resource strain: Not on file  . Food insecurity:    Worry: Not on file    Inability: Not on file  . Transportation needs:    Medical: Not on file    Non-medical: Not on file  Tobacco Use  . Smoking status: Current Every Day Smoker    Packs/day: 0.25    Years: 45.00    Pack years: 11.25    Types: Cigarettes    Start date: 02/12/1971  . Smokeless tobacco: Never Used  Substance and Sexual Activity  . Alcohol use: Not Currently    Alcohol/week: 0.0 standard drinks    Comment: stopped 2 weeks ago.  . Drug use: No  . Sexual activity: Yes  Lifestyle  . Physical activity:    Days per week:  Not on file    Minutes per session: Not on file  . Stress: Not on file  Relationships  . Social connections:    Talks on phone: Not on file    Gets together: Not on file    Attends religious service: Not on file    Active member of club or organization: Not on file    Attends meetings of clubs or organizations: Not on file    Relationship status: Not on file  . Intimate partner violence:    Fear of current or ex partner: Not on file    Emotionally abused: Not on file    Physically abused: Not on file    Forced sexual activity: Not on file  Other Topics Concern  . Not on file  Social History Narrative  . Not on file    FAMILY HISTORY:  Family History  Problem Relation Age of Onset  . Diabetes Mother   . Heart disease Mother   . Hypertension Mother   . Healthy Brother   . Other Brother        accident   . Arthritis Sister   . Arthritis Sister   . SIDS Brother   . Colon cancer Neg Hx     CURRENT MEDICATIONS:  Outpatient Encounter Medications as of 06/08/2018  Medication Sig Note  . aspirin 81 MG chewable tablet Chew 81 mg by  mouth daily. 02/14/2018: Last dose 02/13/2018. On hold for surgery.  [name] atorvastatin (LIPITOR) 80 MG tablet Take 80 mg by mouth at bedtime.    [name] CARBOPLATIN IV Inject into the vein. 6 weekly treatments with radiation   . cilostazol (PLETAL) 50 MG tablet TAKE 1 TABLET(50 MG) BY MOUTH TWICE DAILY   . Coenzyme Q10 100 MG capsule Take 100 mg by mouth at bedtime.    . Diphenhyd-Hydrocort-Nystatin (FIRST-DUKES MOUTHWASH MT) Take by mouth.   . dronabinol (MARINOL) 5 MG capsule Take 1 capsule (5 mg total) by mouth 2 (two) times daily before a meal.   . FLOVENT HFA 110 MCG/ACT inhaler Inhale 1 puff into the lungs 2 (two) times daily.   . fluticasone (FLONASE) 50 MCG/ACT nasal spray Place 1 spray into both nostrils daily.   . hydrOXYzine (ATARAX/VISTARIL) 50 MG tablet Take by mouth.   [name] ipratropium-albuterol (DUONEB) 0.5-2.5 (3) MG/3ML SOLN Take 3 mLs by nebulization every 6 (six) hours as needed (for shortness of breath).   . lidocaine-prilocaine (EMLA) cream Apply a quarter size amount to affected area 1 hour prior to coming to chemotherapy.  Do not rub in.  Cover with plastic wrap.   . megestrol (MEGACE) 20 MG tablet Take by mouth.   . metFORMIN (GLUCOPHAGE) 500 MG tablet Take 500 mg by mouth 2 (two) times daily.    . metoprolol succinate (TOPROL-XL) 50 MG 24 hr tablet Take 50 mg by mouth at bedtime.    [name] nystatin (MYCOSTATIN) 100000 UNIT/ML suspension    . Omega-3 Fatty Acids (FISH OIL) 1000 MG CAPS Take 1 capsule by mouth 2 (two) times daily.    . ondansetron (ZOFRAN) 8 MG tablet Take 1 tablet (8 mg total) by mouth every 8 (eight) hours as needed for nausea or vomiting.   [name] oxyCODONE-acetaminophen (PERCOCET/ROXICET) 5-325 MG tablet Take 1 tablet by mouth 2 (two) times daily as needed for severe pain.   [name] PACLitaxel (TAXOL IV) Inject into the vein. 6 weekly treatments with radiation   . pantoprazole (PROTONIX) 40 MG tablet Take 40 mg by mouth 2 (two)  times daily.   . prochlorperazine (COMPAZINE) 10 MG  tablet Take 1 tablet (10 mg total) by mouth every 6 (six) hours as needed for nausea or vomiting.   . triamcinolone cream (KENALOG) 0.1 % Apply topically 2 (two) times daily.    No facility-administered encounter medications on file as of 06/08/2018.     ALLERGIES:  Allergies  Allergen Reactions  . Lisinopril Anaphylaxis  . Penicillins Anaphylaxis    Has patient had a PCN reaction causing immediate rash, facial/tongue/throat swelling, SOB or lightheadedness with hypotension:Yes Has patient had a PCN reaction causing severe rash involving mucus membranes or skin necrosis:unsure Has patient had a PCN reaction that required hospitalization:unsure Has patient had a PCN reaction occurring within the last 10 years:~10 years per patient If all of the above answers are "NO", then may proceed with Cephalosporin use.   . Ace Inhibitors Swelling     PHYSICAL EXAM:  ECOG Performance status: 1  Vitals:   06/08/18 0901  BP: 104/65  Pulse: (!) 103  Resp: 18  Temp: 98.2 F (36.8 C)  SpO2: 100%   Filed Weights   06/08/18 0901  Weight: 143 lb 12.8 oz (65.2 kg)    Physical Exam  Constitutional: He is oriented to person, place, and time. He appears well-developed.  Cardiovascular: Normal rate, regular rhythm and normal heart sounds.  Pulmonary/Chest: Effort normal and breath sounds normal.  Abdominal: Soft. Bowel sounds are normal.  Neurological: He is alert and oriented to person, place, and time.  Skin: Skin is warm and dry.     LABORATORY DATA:  I have reviewed the labs as listed.  CBC    Component Value Date/Time   WBC 12.9 (H) 06/08/2018 0840   RBC 3.86 (L) 06/08/2018 0840   HGB 11.8 (L) 06/08/2018 0840   HCT 35.6 (L) 06/08/2018 0840   PLT 341 06/08/2018 0840   MCV 92.2 06/08/2018 0840   MCH 30.6 06/08/2018 0840   MCHC 33.1 06/08/2018 0840   RDW 16.8 (H) 06/08/2018 0840   LYMPHSABS 1.5 06/08/2018 0840   MONOABS 0.9 06/08/2018 0840   EOSABS 0.4 06/08/2018 0840    BASOSABS 0.0 06/08/2018 0840   CMP Latest Ref Rng & Units 06/08/2018 05/25/2018 05/11/2018  Glucose 70 - 99 mg/dL 112(H) 115(H) 132(H)  BUN 8 - 23 mg/dL 13 13 12   Creatinine 0.61 - 1.24 mg/dL 0.68 0.66 0.61  Sodium 135 - 145 mmol/L 140 140 141  Potassium 3.5 - 5.1 mmol/L 4.4 5.0 4.9  Chloride 98 - 111 mmol/L 110 108 107  CO2 22 - 32 mmol/L 20(L) 23 25  Calcium 8.9 - 10.3 mg/dL 9.7 9.9 9.5  Total Protein 6.5 - 8.1 g/dL 8.1 8.2(H) 7.3  Total Bilirubin 0.3 - 1.2 mg/dL 0.3 0.3 0.4  Alkaline Phos 38 - 126 U/L 87 84 97  AST 15 - 41 U/L 25 16 16   ALT 0 - 44 U/L 26 19 20        ASSESSMENT & PLAN:   Squamous cell carcinoma of right lung (HCC) 1.  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 -Patient evaluated by Dr. Roxan Hockey and was recommended to have preoperative chemoradiation therapy. -Chemoradiation therapy with carboplatin and paclitaxel from 02/20/2018 through 04/04/2018. -I have reviewed the results of the CT scan of the chest dated 04/27/2018 which showed regression of the tumor which is sub-solid in appearance.  No new areas  were seen. -He was started on durvalumab maintenance on 05/11/2018.  He tolerated first 2 doses very well.  Denies any immunotherapy related side effects.  He had one episode of diarrhea this morning.  I have counseled him to call us immediately should he develop any consistent diarrhea or change in cough or development of new cough.  He will continue durvalumab every 2 weeks.  I will see him back in 8 weeks with repeat CT scan of the chest with contrast.  2.  Weight loss: He is able to eat well.  He gained back about 9 pounds since finishing chemoradiation therapy.  3.  Recurrent prostate cancer: He had a history of prostate cancer and reportedly received radiation therapy 6-7 years ago.  He had prostate biopsies on 01/17/2018 which showed recurrence  with a Gleason score (5+5)=10.  He will be treated with GnRH agonist.  4.  Cancer related pain: -Complains of right upper back/shoulder pain.  Cold air from the air conditioning at nighttime is making the pain was for the last few weeks.  I have given a refill for Percocet to be taken twice daily as needed.  He was told to use local measures like heating pad.      Orders placed this encounter:  Orders Placed This Encounter  Procedures  . CT Chest W Contrast  . CBC with Differential/Platelet  . Comprehensive metabolic panel  . CBC with Differential/Platelet  . Comprehensive metabolic panel      Derek Jack, MD [place] [phone]

## 2018-06-08 NOTE — Patient Instructions (Signed)
[facility] Discharge Instructions for Patients Receiving Chemotherapy   Beginning January 23rd 2017 lab work for the Cancer Center will be done in the  Main lab at [place] on 1st floor. If you have a lab appointment with the Cancer Center please come in thru the  Main Entrance and check in at the main information desk   Today you received the following chemotherapy agents   To help prevent nausea and vomiting after your treatment, we encourage you to take your nausea medication     If you develop nausea and vomiting, or diarrhea that is not controlled by your medication, call the clinic.  The clinic phone number is [phone]. Office hours are Monday-Friday 8:30am-5:00pm.  BELOW ARE SYMPTOMS THAT SHOULD BE REPORTED IMMEDIATELY:  *FEVER GREATER THAN 101.0 F  *CHILLS WITH OR WITHOUT FEVER  NAUSEA AND VOMITING THAT IS NOT CONTROLLED WITH YOUR NAUSEA MEDICATION  *UNUSUAL SHORTNESS OF BREATH  *UNUSUAL BRUISING OR BLEEDING  TENDERNESS IN MOUTH AND THROAT WITH OR WITHOUT PRESENCE OF ULCERS  *URINARY PROBLEMS  *BOWEL PROBLEMS  UNUSUAL RASH Items with * indicate a potential emergency and should be followed up as soon as possible. If you have an emergency after office hours please contact your primary care physician or go to the nearest emergency department.  Please call the clinic during office hours if you have any questions or concerns.   You may also contact the Patient Navigator at [phone] should you have any questions or need assistance in obtaining follow up care.      Resources For Cancer Patients and their Caregivers ? American Cancer Society: Can assist with transportation, wigs, general needs, runs Look Good Feel Better.        1-888-227-6333 ? Cancer Care: Provides financial assistance, online support groups, medication/co-pay assistance.  1-800-813-HOPE (4673) ? [facility] Assists [place] cancer  patients and their families through emotional , educational and financial support.  336-427-4357 ? [place] DSS Where to apply for food stamps, Medicaid and utility assistance. 336-342-1394 ? [facility]: Transportation to medical appointments. 336-347-2287 ? Social Security Administration: May apply for disability if have a Stage IV cancer. 336-342-7796 1-800-772-1213 ? [place] Aging, Disability and Transit Services: Assists with nutrition, care and transit needs. 336-349-2343         

## 2018-06-08 NOTE — Patient Instructions (Signed)
[place] at [facility] Discharge Instructions  Follow up with [place] in 8 weeks with labs. Continue treatment every two weeks with labs.    Thank you for choosing [place] at [facility] to provide your oncology and hematology care.  To afford each patient quality time with our provider, please arrive at least 15 minutes before your scheduled appointment time.   If you have a lab appointment with the [place] please come in thru the  Main Entrance and check in at the main information desk  You need to re-schedule your appointment should you arrive 10 or more minutes late.  We strive to give you quality time with our providers, and arriving late affects you and other patients whose appointments are after yours.  Also, if you no show three or more times for appointments you may be dismissed from the clinic at the providers discretion.     Again, thank you for choosing [facility].  Our hope is that these requests will decrease the amount of time that you wait before being seen by our physicians.       _____________________________________________________________  Should you have questions after your visit to [facility], please contact our office at [phone] between the hours of 8:00 a.m. and 4:30 p.m.  Voicemails left after 4:00 p.m. will not be returned until the following business day.  For prescription refill requests, have your pharmacy contact our office and allow 72 hours.    Cancer Center Support Programs:   > Cancer Support Group  2nd Tuesday of the month 1pm-2pm, Journey Room

## 2018-06-08 NOTE — Progress Notes (Signed)
Patient seen today for office visit by Dr. Delton Coombes, proceed with treatment.   Treatment given per orders. Patient tolerated it well without problems. Vitals stable and discharged home from clinic ambulatory. Follow up as scheduled.

## 2018-06-08 NOTE — Assessment & Plan Note (Signed)
1.  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 -Patient evaluated by Dr. Roxan Hockey and was recommended to have preoperative chemoradiation therapy. -Chemoradiation therapy with carboplatin and paclitaxel from 02/20/2018 through 04/04/2018. -I have reviewed the results of the CT scan of the chest dated 04/27/2018 which showed regression of the tumor which is sub-solid in appearance.  No new areas were seen. -He was started on durvalumab maintenance on 05/11/2018.  He tolerated first 2 doses very well.  Denies any immunotherapy related side effects.  He had one episode of diarrhea this morning.  I have counseled him to call us immediately should he develop any consistent diarrhea or change in cough or development of new cough.  He will continue durvalumab every 2 weeks.  I will see him back in 8 weeks with repeat CT scan of the chest with contrast.  2.  Weight loss: He is able to eat well.  He gained back about 9 pounds since finishing chemoradiation therapy.  3.  Recurrent prostate cancer: He had a history of prostate cancer and reportedly received radiation therapy 6-7 years ago.  He had prostate biopsies on 01/17/2018 which showed recurrence with a Gleason score (5+5)=10.  He will be treated with GnRH agonist.  4.  Cancer related pain: -Complains of right upper back/shoulder pain.  Cold air from the air conditioning at nighttime is making the pain was for the last few weeks.  I have given a refill for Percocet to be taken twice daily as needed.  He was told to use local measures like heating pad.

## 2018-06-14 ENCOUNTER — Ambulatory Visit (INDEPENDENT_AMBULATORY_CARE_PROVIDER_SITE_OTHER): Payer: Medicare Other

## 2018-06-14 ENCOUNTER — Encounter: Payer: Self-pay | Admitting: Orthopaedic Surgery

## 2018-06-14 ENCOUNTER — Ambulatory Visit (INDEPENDENT_AMBULATORY_CARE_PROVIDER_SITE_OTHER): Payer: Medicare Other | Admitting: Orthopaedic Surgery

## 2018-06-14 VITALS — BP 119/74 | HR 130 | Ht 68.0 in | Wt 141.0 lb

## 2018-06-14 DIAGNOSIS — M545 Low back pain, unspecified: Secondary | ICD-10-CM

## 2018-06-14 DIAGNOSIS — C3491 Malignant neoplasm of unspecified part of right bronchus or lung: Secondary | ICD-10-CM | POA: Diagnosis not present

## 2018-06-14 DIAGNOSIS — C61 Malignant neoplasm of prostate: Secondary | ICD-10-CM

## 2018-06-14 DIAGNOSIS — M25511 Pain in right shoulder: Secondary | ICD-10-CM | POA: Diagnosis not present

## 2018-06-14 MED ORDER — OXYCODONE-ACETAMINOPHEN 5-325 MG PO TABS: 1.0000 | ORAL_TABLET | Freq: Two times a day (BID) | ORAL | 0 refills | Status: DC | PRN

## 2018-06-14 NOTE — Patient Instructions (Signed)
Steps to Quit Smoking Smoking tobacco can be bad for your health. It can also affect almost every organ in your body. Smoking puts you and people around you at risk for many serious long-lasting (chronic) diseases. Quitting smoking is hard, but it is one of the best things that you can do for your health. It is never too late to quit. What are the benefits of quitting smoking? When you quit smoking, you lower your risk for getting serious diseases and conditions. They can include:  Lung cancer or lung disease.  Heart disease.  Stroke.  Heart attack.  Not being able to have children (infertility).  Weak bones (osteoporosis) and broken bones (fractures).  If you have coughing, wheezing, and shortness of breath, those symptoms may get better when you quit. You may also get sick less often. If you are pregnant, quitting smoking can help to lower your chances of having a baby of low birth weight. What can I do to help me quit smoking? Talk with your doctor about what can help you quit smoking. Some things you can do (strategies) include:  Quitting smoking totally, instead of slowly cutting back how much you smoke over a period of time.  Going to in-person counseling. You are more likely to quit if you go to many counseling sessions.  Using resources and support systems, such as: ? Online chats with a counselor. ? Phone quitlines. ? Printed self-help materials. ? Support groups or group counseling. ? Text messaging programs. ? Mobile phone apps or applications.  Taking medicines. Some of these medicines may have nicotine in them. If you are pregnant or breastfeeding, do not take any medicines to quit smoking unless your doctor says it is okay. Talk with your doctor about counseling or other things that can help you.  Talk with your doctor about using more than one strategy at the same time, such as taking medicines while you are also going to in-person counseling. This can help make  quitting easier. What things can I do to make it easier to quit? Quitting smoking might feel very hard at first, but there is a lot that you can do to make it easier. Take these steps:  Talk to your family and friends. Ask them to support and encourage you.  Call phone quitlines, reach out to support groups, or work with a counselor.  Ask people who smoke to not smoke around you.  Avoid places that make you want (trigger) to smoke, such as: ? Bars. ? Parties. ? Smoke-break areas at work.  Spend time with people who do not smoke.  Lower the stress in your life. Stress can make you want to smoke. Try these things to help your stress: ? Getting regular exercise. ? Deep-breathing exercises. ? Yoga. ? Meditating. ? Doing a body scan. To do this, close your eyes, focus on one area of your body at a time from head to toe, and notice which parts of your body are tense. Try to relax the muscles in those areas.  Download or buy apps on your mobile phone or tablet that can help you stick to your quit plan. There are many free apps, such as QuitGuide from the CDC (Centers for Disease Control and Prevention). You can find more support from smokefree.gov and other websites.  This information is not intended to replace advice given to you by your health care provider. Make sure you discuss any questions you have with your health care provider. Document Released: 08/13/2009 Document   Revised: 06/14/2016 Document Reviewed: 03/03/2015 Elsevier Interactive Patient Education  2018 Elsevier Inc.  

## 2018-06-14 NOTE — Progress Notes (Signed)
Patient Fred Hernandez, male DOB:09/26/1956, 62 y.o. AVW:[id number]  Chief Complaint  Patient presents with  . Shoulder Pain    right  . Back Pain    with standing     HPI  Fred Hernandez is a 62 y.o. male who has had increasing pain of his lower back.  He has been treated with radiation and chemotherapy for cancer of the lung.  He is now on immunotherapy.  He also has cancer of the prostate and is being treated for that as well.  He has no trauma, no weakness.  He has no paresthesias.  His right shoulder is tender and bothers him a lot more with overhead use.     Body mass index is 21.44 kg/m.  ROS  Review of Systems  Constitutional:       Patient has Diabetes Mellitus. Patient has hypertension. Patient has COPD or shortness of breath. Patient does not have BMI > 35. Patient has current smoking history  HENT: Negative for congestion.   Cardiovascular: Negative for chest pain.  Endocrine: Positive for cold intolerance.  Musculoskeletal: Positive for arthralgias and myalgias.  Allergic/Immunologic: Positive for environmental allergies.  Neurological: Negative for numbness.  All other systems reviewed and are negative.   All other systems reviewed and are negative.  Past Medical History:  Diagnosis Date  . Bursitis   . CAD (coronary artery disease)     s/p stent placement unknown artery 12/2014,  . Cancer [facility])    prostate cancer  . COPD (chronic obstructive pulmonary disease) ([place])   . Diabetes mellitus ([place])   . GERD (gastroesophageal reflux disease)   . History of prostate cancer   . Hyperlipemia   . Hypertension   . Peripheral arterial disease ([place])    a. h/o stents. b. 03/2016  - successful PTA and covered stenting using overlapping Viabahn covered stents of a long segment in-stent restenosis of previously placed nitinol self expanding stents in the proximal mid and distal right SFA.  . Tobacco abuse     Past Surgical History:  Procedure Laterality Date  .  COLONOSCOPY WITH PROPOFOL N/A 11/22/2016   Procedure: COLONOSCOPY WITH PROPOFOL;  Surgeon: [name], MD;  Location[facility] ENDO SU[facility];  Service: Endoscopy;  Laterality: N/A;  10:00 am  . CORONARY STENT PLACEMENT  12-2014   [facility] [employer]  . FEMORAL ARTERY STENT Bilateral 03/2013  . PERIPHERAL VASCULAR CATHETERIZATION N/A 04/04/2016   Procedure: Lower Extremity Angiography;  Surgeon: Lorretta Harp, MD;  Location: [place] CV LAB;  Service: Cardiovascular;  Laterality: N/A;  . PERIPHERAL VASCULAR CATHETERIZATION N/A 04/04/2016   Procedure: Abdominal Aortogram;  Surgeon: Lorretta Harp, MD;  Location: [place] CV LAB;  Service: Cardiovascular;  Laterality: N/A;  . PERIPHERAL VASCULAR CATHETERIZATION  04/04/2016   Procedure: Peripheral Vascular Intervention;  Surgeon: Lorretta Harp, MD;  Location: [place] CV LAB;  Service: Cardiovascular;;  rt SFA stent  . POLYPECTOMY  11/22/2016   Procedure: POLYPECTOMY;  Surgeon: [name], MD;  Location: [facility];  Service: Endoscopy;;  descending colon polyp, transverse colon polyp, sigmoid colon polyp, rectal polyp  . PORTACATH PLACEMENT Left 02/19/2018   Procedure: INSERTION PORT-A-CATH;  Surgeon: Aviva Signs, MD;  Location: [facility] ORS;  Service: General;  Laterality: Left;  . STENT PLACEMENT VASCULAR ([place] [facility])  2013    Family History  Problem Relation Age of Onset  . Diabetes Mother   . Heart disease Mother   . Hypertension Mother   .  Healthy Brother   . Other Brother        accident   . Arthritis Sister   . Arthritis Sister   . SIDS Brother   . Colon cancer Neg Hx     Social History Social History   Tobacco Use  . Smoking status: Current Every Day Smoker    Packs/day: 0.25    Years: 45.00    Pack years: 11.25    Types: Cigarettes    Start date: 02/12/1971  . Smokeless tobacco: Never Used  Substance Use Topics  . Alcohol use: Not Currently    Alcohol/week: 0.0 standard drinks    Comment: stopped 2  weeks ago.  . Drug use: No    Allergies  Allergen Reactions  . Lisinopril Anaphylaxis  . Penicillins Anaphylaxis    Has patient had a PCN reaction causing immediate rash, facial/tongue/throat swelling, SOB or lightheadedness with hypotension:Yes Has patient had a PCN reaction causing severe rash involving mucus membranes or skin necrosis:unsure Has patient had a PCN reaction that required hospitalization:unsure Has patient had a PCN reaction occurring within the last 10 years:~10 years per patient If all of the above answers are "NO", then may proceed with Cephalosporin use.   . Ace Inhibitors Swelling    Current Outpatient Medications  Medication Sig Dispense Refill  . aspirin 81 MG chewable tablet Chew 81 mg by mouth daily.    [name] atorvastatin (LIPITOR) 80 MG tablet Take 80 mg by mouth at bedtime.     [name] CARBOPLATIN IV Inject into the vein. 6 weekly treatments with radiation    . cilostazol (PLETAL) 50 MG tablet TAKE 1 TABLET(50 MG) BY MOUTH TWICE DAILY 60 tablet 2  . Coenzyme Q10 100 MG capsule Take 100 mg by mouth at bedtime.     . Diphenhyd-Hydrocort-Nystatin (FIRST-DUKES MOUTHWASH MT) Take by mouth.    . dronabinol (MARINOL) 5 MG capsule Take 1 capsule (5 mg total) by mouth 2 (two) times daily before a meal. 60 capsule 1  . FLOVENT HFA 110 MCG/ACT inhaler Inhale 1 puff into the lungs 2 (two) times daily.  2  . fluticasone (FLONASE) 50 MCG/ACT nasal spray Place 1 spray into both nostrils daily.    . hydrOXYzine (ATARAX/VISTARIL) 50 MG tablet Take by mouth.    [name] ipratropium-albuterol (DUONEB) 0.5-2.5 (3) MG/3ML SOLN Take 3 mLs by nebulization every 6 (six) hours as needed (for shortness of breath).    . lidocaine-prilocaine (EMLA) cream Apply a quarter size amount to affected area 1 hour prior to coming to chemotherapy.  Do not rub in.  Cover with plastic wrap. 30 g 2  . megestrol (MEGACE) 20 MG tablet Take by mouth.    . metFORMIN (GLUCOPHAGE) 500 MG tablet Take 500 mg by mouth 2  (two) times daily.     . metoprolol succinate (TOPROL-XL) 50 MG 24 hr tablet Take 50 mg by mouth at bedtime.     [name] nystatin (MYCOSTATIN) 100000 UNIT/ML suspension     . Omega-3 Fatty Acids (FISH OIL) 1000 MG CAPS Take 1 capsule by mouth 2 (two) times daily.     . ondansetron (ZOFRAN) 8 MG tablet Take 1 tablet (8 mg total) by mouth every 8 (eight) hours as needed for nausea or vomiting. 30 tablet 2  . oxyCODONE-acetaminophen (PERCOCET/ROXICET) 5-325 MG tablet Take 1 tablet by mouth 2 (two) times daily as needed for severe pain. 60 tablet 0  . PACLitaxel (TAXOL IV) Inject into the vein. 6 weekly treatments with radiation    .  pantoprazole (PROTONIX) 40 MG tablet Take 40 mg by mouth 2 (two) times daily.    . prochlorperazine (COMPAZINE) 10 MG tablet Take 1 tablet (10 mg total) by mouth every 6 (six) hours as needed for nausea or vomiting. 30 tablet 2  . triamcinolone cream (KENALOG) 0.1 % Apply topically 2 (two) times daily.  2   No current facility-administered medications for this visit.      Physical Exam  Blood pressure 119/74, pulse (!) 130, height 5\' 8"  (1.727 m), weight 141 lb (64 kg).  Constitutional: overall normal hygiene, normal nutrition, well developed, normal grooming, normal body habitus. Assistive device:none  Musculoskeletal: gait and station Limp none, muscle tone and strength are normal, no tremors or atrophy is present.  .  Neurological: coordination overall normal.  Deep tendon reflex/nerve stretch intact.  Sensation normal.  Cranial nerves II-XII intact.   Skin:   Normal overall no scars, lesions, ulcers or rashes. No psoriasis.  Psychiatric: Alert and oriented x 3.  Recent memory intact, remote memory unclear.  Normal mood and affect. Well groomed.  Good eye contact.  Cardiovascular: overall no swelling, no varicosities, no edema bilaterally, normal temperatures of the legs and arms, no clubbing, cyanosis and good capillary refill.  Lymphatic: palpation is  normal.  Spine/Pelvis examination:  Inspection:  Overall, sacoiliac joint benign and hips nontender; without crepitus or defects.   Thoracic spine inspection: Alignment normal without kyphosis present   Lumbar spine inspection:  Alignment  with normal lumbar lordosis, without scoliosis apparent.   Thoracic spine palpation:  without tenderness of spinal processes   Lumbar spine palpation: without tenderness of lumbar area; without tightness of lumbar muscles    Range of Motion:   Lumbar flexion, forward flexion is normal without pain or tenderness    Lumbar extension is full without pain or tenderness   Left lateral bend is normal without pain or tenderness   Right lateral bend is normal without pain or tenderness   Straight leg raising is normal  Strength & tone: normal   Stability overall normal stability  Right shoulder has full motion but tender in the extremes.  NV intact.  Grips are normal.  All other systems reviewed and are negative   The patient has been educated about the nature of the problem(s) and counseled on treatment options.  The patient appeared to understand what I have discussed and is in agreement with it.  Encounter Diagnoses  Name Primary?  . Acute midline low back pain without sciatica Yes  . Pain in joint of right shoulder   . Squamous cell carcinoma of right lung ([place])   . Primary lung cancer with metastasis from lung to other site, right ([place])   . Adenocarcinoma of prostate ([place])    X-rays were done of the lumbar spine, reported separately.  PLAN Call if any problems.  Precautions discussed.  Continue current medications.   Return to clinic after MRI   I am concerned about possible metastasis and would like to get a MRI with contrast of the lumbar spine.  Return after the MRI.  I have reviewed the [place] web site prior to prescribing narcotic medicine for this patient.  Electronically  Signed Sanjuana Kava, MD 8/15/201910:54 AM

## 2018-06-15 ENCOUNTER — Telehealth: Payer: Self-pay | Admitting: Orthopaedic Surgery

## 2018-06-15 NOTE — Telephone Encounter (Signed)
Pt did receive a shot in his Right shoulder by Dr. Luna Glasgow 06/14/18. No answer and VM was left stating pt is allowed to ice the shoulder and take tylenol or motrin as allowed due to his medical concerns. Call back # left for questions or concerns.

## 2018-06-15 NOTE — Telephone Encounter (Signed)
Patient's fiance' called stating patient got an injection in his shoulder yesterday and now it is swollen and he can hardly move it. I have looked at his office visit note and there is no mention of an injection. I asked if he has applied ice to his shoulder and she stated that no one told him to do that. I told her I would check with the clinical staff and get back to her about this.  Please call and advise

## 2018-06-21 ENCOUNTER — Ambulatory Visit (HOSPITAL_COMMUNITY)
Admission: RE | Admit: 2018-06-21 | Discharge: 2018-06-21 | Disposition: A | Discharge status: Home / Self Care | Payer: Medicare Other | Source: Ambulatory Visit | Attending: Orthopaedic Surgery | Admitting: Orthopaedic Surgery

## 2018-06-21 DIAGNOSIS — M5126 Other intervertebral disc displacement, lumbar region: Secondary | ICD-10-CM | POA: Insufficient documentation

## 2018-06-21 DIAGNOSIS — C61 Malignant neoplasm of prostate: Secondary | ICD-10-CM | POA: Insufficient documentation

## 2018-06-21 DIAGNOSIS — M545 Low back pain, unspecified: Secondary | ICD-10-CM

## 2018-06-21 DIAGNOSIS — C3491 Malignant neoplasm of unspecified part of right bronchus or lung: Secondary | ICD-10-CM | POA: Diagnosis not present

## 2018-06-21 DIAGNOSIS — M5136 Other intervertebral disc degeneration, lumbar region: Secondary | ICD-10-CM | POA: Diagnosis not present

## 2018-06-21 MED ORDER — GADOBENATE DIMEGLUMINE 529 MG/ML IV SOLN
13.0000 mL | Freq: Once | INTRAVENOUS | Status: AC | PRN
  Administered 2018-06-21: 13 mL via INTRAVENOUS

## 2018-06-22 ENCOUNTER — Encounter (HOSPITAL_COMMUNITY): Payer: Self-pay | Admitting: Dietician

## 2018-06-22 ENCOUNTER — Other Ambulatory Visit: Payer: Self-pay

## 2018-06-22 ENCOUNTER — Encounter (HOSPITAL_COMMUNITY): Payer: Self-pay

## 2018-06-22 ENCOUNTER — Inpatient Hospital Stay (HOSPITAL_COMMUNITY): Payer: Medicare Other

## 2018-06-22 VITALS — BP 119/60 | HR 94 | Temp 98.6°F | Resp 18 | Wt 144.2 lb

## 2018-06-22 DIAGNOSIS — C3491 Malignant neoplasm of unspecified part of right bronchus or lung: Secondary | ICD-10-CM

## 2018-06-22 DIAGNOSIS — C3411 Malignant neoplasm of upper lobe, right bronchus or lung: Secondary | ICD-10-CM | POA: Diagnosis not present

## 2018-06-22 LAB — CBC WITH DIFFERENTIAL/PLATELET
BASOS ABS: 0 10*3/uL (ref 0.0–0.1)
Basophils Relative: 0 %
Eosinophils Absolute: 0.3 10*3/uL (ref 0.0–0.7)
Eosinophils Relative: 3 %
HCT: 34.4 % — ABNORMAL LOW (ref 39.0–52.0)
Hemoglobin: 11.1 g/dL — ABNORMAL LOW (ref 13.0–17.0)
LYMPHS PCT: 10 %
Lymphs Abs: 1.2 10*3/uL (ref 0.7–4.0)
MCH: 29.8 pg (ref 26.0–34.0)
MCHC: 32.3 g/dL (ref 30.0–36.0)
MCV: 92.2 fL (ref 78.0–100.0)
MONO ABS: 1.3 10*3/uL — AB (ref 0.1–1.0)
MONOS PCT: 11 %
Neutro Abs: 9.5 10*3/uL — ABNORMAL HIGH (ref 1.7–7.7)
Neutrophils Relative %: 76 %
PLATELETS: 378 10*3/uL (ref 150–400)
RBC: 3.73 MIL/uL — ABNORMAL LOW (ref 4.22–5.81)
RDW: 16 % — AB (ref 11.5–15.5)
WBC: 12.3 10*3/uL — ABNORMAL HIGH (ref 4.0–10.5)

## 2018-06-22 LAB — COMPREHENSIVE METABOLIC PANEL
ALT: 21 U/L (ref 0–44)
ANION GAP: 10 (ref 5–15)
AST: 22 U/L (ref 15–41)
Albumin: 3.2 g/dL — ABNORMAL LOW (ref 3.5–5.0)
Alkaline Phosphatase: 81 U/L (ref 38–126)
BUN: 10 mg/dL (ref 8–23)
CHLORIDE: 107 mmol/L (ref 98–111)
CO2: 21 mmol/L — ABNORMAL LOW (ref 22–32)
Calcium: 9.5 mg/dL (ref 8.9–10.3)
Creatinine, Ser: 0.64 mg/dL (ref 0.61–1.24)
Glucose, Bld: 108 mg/dL — ABNORMAL HIGH (ref 70–99)
POTASSIUM: 4.5 mmol/L (ref 3.5–5.1)
Sodium: 138 mmol/L (ref 135–145)
TOTAL PROTEIN: 7.6 g/dL (ref 6.5–8.1)
Total Bilirubin: 0.2 mg/dL — ABNORMAL LOW (ref 0.3–1.2)

## 2018-06-22 MED ORDER — HEPARIN SOD (PORK) LOCK FLUSH 100 UNIT/ML IV SOLN
500.0000 [IU] | Freq: Once | INTRAVENOUS | Status: AC | PRN
  Administered 2018-06-22: 500 [IU]

## 2018-06-22 MED ORDER — SODIUM CHLORIDE 0.9 % IV SOLN
10.0000 mg/kg | Freq: Once | INTRAVENOUS | Status: AC
  Administered 2018-06-22: 620 mg via INTRAVENOUS
  Filled 2018-06-22: qty 10

## 2018-06-22 MED ORDER — SODIUM CHLORIDE 0.9 % IV SOLN
Freq: Once | INTRAVENOUS | Status: AC
  Administered 2018-06-22: 11:00:00 via INTRAVENOUS

## 2018-06-22 NOTE — Progress Notes (Signed)
Tolerated infusion w/o adverse reaction.  Alert, in no distress.  VSS.  Discharged ambulatory in c/o spouse.  

## 2018-06-22 NOTE — Progress Notes (Signed)
Patient Spouse asked for additional case of Ensure. She reports that though he is eating well, he still drinks ~3/day.   Patient actively followed by RD. He has not received a case in many weeks.   Patient provided case of Ensure Today.   Burtis Junes RD, LDN, CNSC Clinical Nutrition Available Tues-Sat via Pager: [phone] 06/22/2018 11:31 AM

## 2018-06-26 ENCOUNTER — Encounter: Payer: Self-pay | Admitting: Orthopaedic Surgery

## 2018-06-26 ENCOUNTER — Ambulatory Visit (INDEPENDENT_AMBULATORY_CARE_PROVIDER_SITE_OTHER): Payer: Medicare Other | Admitting: Orthopaedic Surgery

## 2018-06-26 VITALS — BP 97/60 | HR 116 | Ht 68.0 in | Wt 142.0 lb

## 2018-06-26 DIAGNOSIS — M545 Low back pain, unspecified: Secondary | ICD-10-CM

## 2018-06-26 DIAGNOSIS — M25511 Pain in right shoulder: Secondary | ICD-10-CM | POA: Diagnosis not present

## 2018-06-26 DIAGNOSIS — Z8546 Personal history of malignant neoplasm of prostate: Secondary | ICD-10-CM | POA: Diagnosis not present

## 2018-06-26 NOTE — Patient Instructions (Signed)
..  A referral has been made for you to [facility].  They will call you to schedule the appointment.  If you do not hear from them within a week, please call [phone] and ask for their new patient coordinator.  She should be able to schedule you at that time. 

## 2018-06-26 NOTE — Progress Notes (Signed)
Patient BS:WHQPR W Aytes, male DOB:Oct 22, 1956, 62 y.o. FFM:[id number]  Chief Complaint  Patient presents with  . Back Pain    MRI L Spine results    HPI  Fred Hernandez is a 62 y.o. male who has lower back pain that is still hurting him quite a bit.  He also has pain of the right shoulder.  He has no new trauma, no weakness.  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 This indents the thecal sac minimally and narrows the lateral recesses, right more than left, with potential to affect the L5 nerves, particularly the right. This could certainly be associated with back pain.  1 cm marrow focus in the posterior S1 vertebral body which shows low level enhancement. This is nonspecific. This could simply be benign tissue, but this could represent the earliest manifestation of metastatic prostate cancer. It would be unlikely that this would be symptomatic in any case.  I have explained the findings of the MRI.  I have recommended he see a neurosurgeon for the disc herniation.  I have told him about the abnormal area on S1 and that it could be related to his prostate cancer.  I have strongly recommended he see his urologist. He will see him this week.  The patient has a history of lung cancer as well.  His wife is present. I gave her a copy of the MRI report to give to the urologist.   Body mass index is 21.59 kg/m.  ROS  Review of Systems  Constitutional: Positive for activity change.       Patient has Diabetes Mellitus. Patient has hypertension. Patient has COPD or shortness of breath. Patient does not have BMI > 35. Patient has current smoking history  HENT: Negative for congestion.   Cardiovascular: Negative for chest pain.  Endocrine: Positive for cold intolerance.  Genitourinary: Positive for difficulty urinating.  Musculoskeletal: Positive for  arthralgias, back pain and myalgias.  Allergic/Immunologic: Positive for environmental allergies.  Neurological: Negative for numbness.  All other systems reviewed and are negative.   All other systems reviewed and are negative.  The following is a summary of the past history medically, past history surgically, known current medicines, social history and family history.  This information is gathered electronically by the computer from prior information and documentation.  I review this each visit and have found including this information at this point in the chart is beneficial and informative.    Past Medical History:  Diagnosis Date  . Bursitis   . CAD (coronary artery disease)     s/p stent placement unknown artery 12/2014,  . Cancer [facility])    prostate cancer  . COPD (chronic obstructive pulmonary disease) ([place])   . Diabetes mellitus ([place])   . GERD (gastroesophageal reflux disease)   . History of prostate cancer   . Hyperlipemia   . Hypertension   . Peripheral arterial disease ([place])    a. h/o stents. b. 03/2016  - successful PTA and covered stenting using overlapping Viabahn covered stents of a long segment in-stent restenosis of previously placed nitinol self expanding stents in the proximal mid and distal right SFA.  . Tobacco abuse     Past Surgical History:  Procedure Laterality Date  . COLONOSCOPY WITH PROPOFOL N/A 11/22/2016   Procedure: COLONOSCOPY WITH PROPOFOL;  Surgeon: [name], MD;  Location[facility];  Service: Endoscopy;  Laterality: N/A;  10:00 am  . CORONARY STENT PLACEMENT  12-2014   [facility] [employer]  . FEMORAL ARTERY STENT Bilateral 03/2013  . PERIPHERAL VASCULAR CATHETERIZATION N/A 04/04/2016   Procedure: Lower Extremity Angiography;  Surgeon: Lorretta Harp, MD;  Location: [place] [facility];  Service: Cardiovascular;  Laterality: N/A;  . PERIPHERAL VASCULAR CATHETERIZATION N/A 04/04/2016   Procedure: Abdominal Aortogram;  Surgeon:  Lorretta Harp, MD;  Location: [place] [facility];  Service: Cardiovascular;  Laterality: N/A;  . PERIPHERAL VASCULAR CATHETERIZATION  04/04/2016   Procedure: Peripheral Vascular Intervention;  Surgeon: Lorretta Harp, MD;  Location: [place] [facility];  Service: Cardiovascular;;  rt SFA stent  . POLYPECTOMY  11/22/2016   Procedure: POLYPECTOMY;  Surgeon: [name], MD;  [facility];  Service: Endoscopy;;  descending colon polyp, transverse colon polyp, sigmoid colon polyp, rectal polyp  . PORTACATH PLACEMENT Left 02/19/2018   Procedure: INSERTION PORT-A-CATH;  Surgeon: Aviva Signs, MD;  Location: [facility] ORS;  Service: General;  Laterality: Left;  . STENT PLACEMENT VASCULAR ([place] [facility])  2013    Family History  Problem Relation Age of Onset  . Diabetes Mother   . Heart disease Mother   . Hypertension Mother   . Healthy Brother   . Other Brother        accident   . Arthritis Sister   . Arthritis Sister   . SIDS Brother   . Colon cancer Neg Hx     Social History Social History   Tobacco Use  . Smoking status: Current Every Day Smoker    Packs/day: 0.25    Years: 45.00    Pack years: 11.25    Types: Cigarettes    Start date: 02/12/1971  . Smokeless tobacco: Never Used  Substance Use Topics  . Alcohol use: Not Currently    Alcohol/week: 0.0 standard drinks    Comment: stopped 2 weeks ago.  . Drug use: No    Allergies  Allergen Reactions  . Lisinopril Anaphylaxis  . Penicillins Anaphylaxis    Has patient had a PCN reaction causing immediate rash, facial/tongue/throat swelling, SOB or lightheadedness with hypotension:Yes Has patient had a PCN reaction causing severe rash involving mucus membranes or skin necrosis:unsure Has patient had a PCN reaction that required hospitalization:unsure Has patient had a PCN reaction occurring within the last 10 years:~10 years per patient If all of the above answers are "NO", then may proceed with Cephalosporin use.   . Ace  Inhibitors Swelling    Current Outpatient Medications  Medication Sig Dispense Refill  . aspirin 81 MG chewable tablet Chew 81 mg by mouth daily.    [name] atorvastatin (LIPITOR) 80 MG tablet Take 80 mg by mouth at bedtime.     [name] CARBOPLATIN IV Inject into the vein. 6 weekly treatments with radiation    . cilostazol (PLETAL) 50 MG tablet TAKE 1 TABLET(50 MG) BY MOUTH TWICE DAILY 60 tablet 2  . Coenzyme Q10 100 MG capsule Take 100 mg by mouth at bedtime.     . Diphenhyd-Hydrocort-Nystatin (FIRST-DUKES MOUTHWASH MT) Take by mouth.    . dronabinol (MARINOL) 5 MG capsule Take 1 capsule (5 mg total) by mouth 2 (two) times daily before a meal. 60 capsule 1  . FLOVENT HFA 110 MCG/ACT inhaler Inhale 1 puff into the lungs 2 (two) times daily.  2  . fluticasone (FLONASE) 50 MCG/ACT nasal spray Place 1 spray into both nostrils daily.    . hydrOXYzine (  ATARAX/VISTARIL) 50 MG tablet Take by mouth.    [name] ipratropium-albuterol (DUONEB) 0.5-2.5 (3) MG/3ML SOLN Take 3 mLs by nebulization every 6 (six) hours as needed (for shortness of breath).    . lidocaine-prilocaine (EMLA) cream Apply a quarter size amount to affected area 1 hour prior to coming to chemotherapy.  Do not rub in.  Cover with plastic wrap. 30 g 2  . megestrol (MEGACE) 20 MG tablet Take by mouth.    . metFORMIN (GLUCOPHAGE) 500 MG tablet Take 500 mg by mouth 2 (two) times daily.     . metoprolol succinate (TOPROL-XL) 50 MG 24 hr tablet Take 50 mg by mouth at bedtime.     [name] nystatin (MYCOSTATIN) 100000 UNIT/ML suspension     . Omega-3 Fatty Acids (FISH OIL) 1000 MG CAPS Take 1 capsule by mouth 2 (two) times daily.     . ondansetron (ZOFRAN) 8 MG tablet Take 1 tablet (8 mg total) by mouth every 8 (eight) hours as needed for nausea or vomiting. 30 tablet 2  . oxyCODONE-acetaminophen (PERCOCET/ROXICET) 5-325 MG tablet Take 1 tablet by mouth 2 (two) times daily as needed for severe pain. 60 tablet 0  . PACLitaxel (TAXOL IV) Inject into the vein. 6 weekly  treatments with radiation    . pantoprazole (PROTONIX) 40 MG tablet Take 40 mg by mouth 2 (two) times daily.    . prochlorperazine (COMPAZINE) 10 MG tablet Take 1 tablet (10 mg total) by mouth every 6 (six) hours as needed for nausea or vomiting. 30 tablet 2  . triamcinolone cream (KENALOG) 0.1 % Apply topically 2 (two) times daily.  2   No current facility-administered medications for this visit.      Physical Exam  Blood pressure 97/60, pulse (!) 116, height 5\' 8"  (1.727 m), weight 142 lb (64.4 kg).  Constitutional: overall normal hygiene, normal nutrition, well developed, normal grooming, normal body habitus. Assistive device:none  Musculoskeletal: gait and station Limp none, muscle tone and strength are normal, no tremors or atrophy is present.  .  Neurological: coordination overall normal.  Deep tendon reflex/nerve stretch intact.  Sensation normal.  Cranial nerves II-XII intact.   Skin:   Normal overall no scars, lesions, ulcers or rashes. No psoriasis.  Psychiatric: Alert and oriented x 3.  Recent memory intact, remote memory unclear.  Normal mood and affect. Well groomed.  Good eye contact.  Cardiovascular: overall no swelling, no varicosities, no edema bilaterally, normal temperatures of the legs and arms, no clubbing, cyanosis and good capillary refill.  Lymphatic: palpation is normal.  Spine/Pelvis examination:  Inspection:  Overall, sacoiliac joint benign and hips nontender; without crepitus or defects.   Thoracic spine inspection: Alignment normal without kyphosis present   Lumbar spine inspection:  Alignment  with normal lumbar lordosis, without scoliosis apparent.   Thoracic spine palpation:  without tenderness of spinal processes   Lumbar spine palpation: without tenderness of lumbar area; without tightness of lumbar muscles    Range of Motion:   Lumbar flexion, forward flexion is normal without pain or tenderness    Lumbar extension is full without pain or  tenderness   Left lateral bend is normal without pain or tenderness   Right lateral bend is normal without pain or tenderness   Straight leg raising is normal  Strength & tone: normal   Stability overall normal stability  Examination of right Upper Extremity is done.  Inspection:   Overall:  Elbow non-tender without crepitus or defects, forearm non-tender without crepitus  or defects, wrist non-tender without crepitus or defects, hand non-tender.    Shoulder: with glenohumeral joint tenderness, without effusion.   Upper arm: without swelling and tenderness   Range of motion:   Overall:  Full range of motion of the elbow, full range of motion of wrist and full range of motion in fingers.   Shoulder:  right  150 degrees forward flexion; 125 degrees abduction; 30 degrees internal rotation, 30 degrees external rotation, 10 degrees extension, 40 degrees adduction.   Stability:   Overall:  Shoulder, elbow and wrist stable   Strength and Tone:   Overall full shoulder muscles strength, full upper arm strength and normal upper arm bulk and tone.  All other systems reviewed and are negative   The patient has been educated about the nature of the problem(s) and counseled on treatment options.  The patient appeared to understand what I have discussed and is in agreement with it.  Encounter Diagnoses  Name Primary?  . Acute midline low back pain without sciatica Yes  . Pain in joint of right shoulder   . History of prostate cancer     PLAN Call if any problems.  Precautions discussed.  Continue current medications.   Return to clinic 1 month   To see neurosurgeon.  To see urologist.  Electronically Signed Sanjuana Kava, MD 8/27/201910:29 AM

## 2018-06-26 NOTE — Addendum Note (Signed)
Addended by: Glory Buff on: 06/26/2018 10:33 AM   Modules accepted: Orders

## 2018-07-06 ENCOUNTER — Inpatient Hospital Stay (HOSPITAL_COMMUNITY): Payer: Medicare Other | Attending: Hematology

## 2018-07-06 ENCOUNTER — Inpatient Hospital Stay (HOSPITAL_COMMUNITY): Payer: Medicare Other

## 2018-07-06 ENCOUNTER — Encounter (HOSPITAL_COMMUNITY): Payer: Self-pay

## 2018-07-06 VITALS — BP 105/60 | HR 102 | Temp 98.4°F | Resp 18 | Wt 141.6 lb

## 2018-07-06 DIAGNOSIS — Z5111 Encounter for antineoplastic chemotherapy: Secondary | ICD-10-CM | POA: Insufficient documentation

## 2018-07-06 DIAGNOSIS — C3491 Malignant neoplasm of unspecified part of right bronchus or lung: Secondary | ICD-10-CM | POA: Insufficient documentation

## 2018-07-06 LAB — CBC WITH DIFFERENTIAL/PLATELET
BASOS ABS: 0 10*3/uL (ref 0.0–0.1)
Basophils Relative: 0 %
Eosinophils Absolute: 0.3 10*3/uL (ref 0.0–0.7)
Eosinophils Relative: 2 %
HEMATOCRIT: 34.5 % — AB (ref 39.0–52.0)
Hemoglobin: 11.4 g/dL — ABNORMAL LOW (ref 13.0–17.0)
LYMPHS PCT: 12 %
Lymphs Abs: 1.7 10*3/uL (ref 0.7–4.0)
MCH: 30.3 pg (ref 26.0–34.0)
MCHC: 33 g/dL (ref 30.0–36.0)
MCV: 91.8 fL (ref 78.0–100.0)
Monocytes Absolute: 1.2 10*3/uL — ABNORMAL HIGH (ref 0.1–1.0)
Monocytes Relative: 9 %
NEUTROS ABS: 10.6 10*3/uL — AB (ref 1.7–7.7)
NEUTROS PCT: 77 %
Platelets: 385 10*3/uL (ref 150–400)
RBC: 3.76 MIL/uL — AB (ref 4.22–5.81)
RDW: 15 % (ref 11.5–15.5)
WBC: 13.9 10*3/uL — AB (ref 4.0–10.5)

## 2018-07-06 LAB — COMPREHENSIVE METABOLIC PANEL
ALT: 31 U/L (ref 0–44)
AST: 28 U/L (ref 15–41)
Albumin: 3.4 g/dL — ABNORMAL LOW (ref 3.5–5.0)
Alkaline Phosphatase: 87 U/L (ref 38–126)
Anion gap: 11 (ref 5–15)
BILIRUBIN TOTAL: 0.4 mg/dL (ref 0.3–1.2)
BUN: 16 mg/dL (ref 8–23)
CHLORIDE: 111 mmol/L (ref 98–111)
CO2: 19 mmol/L — ABNORMAL LOW (ref 22–32)
CREATININE: 0.56 mg/dL — AB (ref 0.61–1.24)
Calcium: 9.5 mg/dL (ref 8.9–10.3)
GFR calc Af Amer: >60 mL/min (ref >60)
GLUCOSE: 108 mg/dL — AB (ref 70–99)
Potassium: 4 mmol/L (ref 3.5–5.1)
Sodium: 141 mmol/L (ref 135–145)
Total Protein: 8.1 g/dL (ref 6.5–8.1)

## 2018-07-06 MED ORDER — HEPARIN SOD (PORK) LOCK FLUSH 100 UNIT/ML IV SOLN
500.0000 [IU] | Freq: Once | INTRAVENOUS | Status: AC | PRN
  Administered 2018-07-06: 500 [IU]

## 2018-07-06 MED ORDER — OXYCODONE-ACETAMINOPHEN 5-325 MG PO TABS
ORAL_TABLET | ORAL | Status: AC
  Filled 2018-07-06: qty 2

## 2018-07-06 MED ORDER — SODIUM CHLORIDE 0.9 % IV SOLN
10.0000 mg/kg | Freq: Once | INTRAVENOUS | Status: AC
  Administered 2018-07-06: 620 mg via INTRAVENOUS
  Filled 2018-07-06: qty 10

## 2018-07-06 MED ORDER — OXYCODONE-ACETAMINOPHEN 5-325 MG PO TABS
2.0000 | ORAL_TABLET | Freq: Once | ORAL | Status: AC
  Administered 2018-07-06: 2 via ORAL

## 2018-07-06 MED ORDER — SODIUM CHLORIDE 0.9% FLUSH
10.0000 mL | INTRAVENOUS | Status: DC | PRN
  Administered 2018-07-06: 10 mL
  Filled 2018-07-06: qty 10

## 2018-07-06 MED ORDER — SODIUM CHLORIDE 0.9 % IV SOLN
Freq: Once | INTRAVENOUS | Status: AC
  Administered 2018-07-06: 10:00:00 via INTRAVENOUS

## 2018-07-06 NOTE — Progress Notes (Signed)
Patient to treatment room for therapy.  Stated right shoulder pain this morning.  Unable to take medication due to transportation and being ready on time for the ride.  Reviewed with oncologist and ok to give pain medication for shoulder pain.    Patient tolerated therapy with no complaints voiced.  Stated shoulder pain was better.  Rated 3 on scale with discharge.  See flow sheets for pain management. Port site clean and dry with good blood return noted before and after therapy.  Band aid applied.  VSS with discharge and left ambulatory with no s/s of distress noted.

## 2018-07-06 NOTE — Patient Instructions (Signed)
[facility] Discharge Instructions for Patients Receiving Chemotherapy  Today you received the following chemotherapy agents imfinzi.    If you develop nausea and vomiting that is not controlled by your nausea medication, call the clinic.   BELOW ARE SYMPTOMS THAT SHOULD BE REPORTED IMMEDIATELY:  *FEVER GREATER THAN 100.5 F  *CHILLS WITH OR WITHOUT FEVER  NAUSEA AND VOMITING THAT IS NOT CONTROLLED WITH YOUR NAUSEA MEDICATION  *UNUSUAL SHORTNESS OF BREATH  *UNUSUAL BRUISING OR BLEEDING  TENDERNESS IN MOUTH AND THROAT WITH OR WITHOUT PRESENCE OF ULCERS  *URINARY PROBLEMS  *BOWEL PROBLEMS  UNUSUAL RASH Items with * indicate a potential emergency and should be followed up as soon as possible.  Feel free to call the clinic should you have any questions or concerns. The clinic phone number is [phone].  Please show the [facility] at check-in to the Emergency Department and triage nurse.

## 2018-07-06 NOTE — Progress Notes (Signed)
Nutrition Follow-up:  Patient with lung cancer and receiving Durvalumab.    Met with patient this am and wife during infusion.  Patient reports appetite so-so due to shoulder pain.  Reports had MRI and findings noted.  Planning to follow-up with urology on 9/11 per wife.  Wife reports that patient is still trying to eat every 2 hours.  "I snack on potato chips, peanut butter crackers, etc."  Patient continues to drink ensure 2-3 times per day.   No other nutrition impact symptoms reported today.   Medications: reviewed  Labs: reviewed  Anthropometrics:   Weight today increased to 141 lb 9.6 oz from 139 lb 9.6 oz on 7/26   NUTRITION DIAGNOSIS: Severe malnutrition resolving with weight gain   MALNUTRITION DIAGNOSIS: Severe malnutrition resolving   INTERVENTION:  Additional case of ensure enlive given today along with coupons Reviewed high calorie, high protein foods to snack on.  Patient verbalized understanding    MONITORING, EVALUATION, GOAL: weight trends, po intake   NEXT VISIT: October 4 during infusion  Shareef Eddinger B. Zenia Resides, [place], [place] Registered Dietitian [phone] (pager)

## 2018-07-10 ENCOUNTER — Telehealth: Payer: Self-pay | Admitting: Orthopaedic Surgery

## 2018-07-10 MED ORDER — HYDROCODONE-ACETAMINOPHEN 7.5-325 MG PO TABS: ORAL_TABLET | ORAL | 0 refills | Status: DC

## 2018-07-10 NOTE — Telephone Encounter (Signed)
Patient requests refill on Oxycodone/Acetaminophen 5-325 mgs.  Qty 60       Sig: Take 1 tablet by mouth 2 (two) times daily as needed for severe pain.    Patient states he uses [facility] in [place], [place]

## 2018-07-11 ENCOUNTER — Ambulatory Visit (INDEPENDENT_AMBULATORY_CARE_PROVIDER_SITE_OTHER): Payer: Medicare Other | Admitting: Urology

## 2018-07-11 DIAGNOSIS — C61 Malignant neoplasm of prostate: Secondary | ICD-10-CM

## 2018-07-11 DIAGNOSIS — N5201 Erectile dysfunction due to arterial insufficiency: Secondary | ICD-10-CM | POA: Diagnosis not present

## 2018-07-13 ENCOUNTER — Ambulatory Visit (HOSPITAL_COMMUNITY): Payer: Medicare Other

## 2018-07-13 ENCOUNTER — Other Ambulatory Visit (HOSPITAL_COMMUNITY): Payer: Medicare Other

## 2018-07-20 ENCOUNTER — Inpatient Hospital Stay (HOSPITAL_COMMUNITY): Payer: Medicare Other

## 2018-07-20 ENCOUNTER — Encounter (HOSPITAL_COMMUNITY): Payer: Self-pay

## 2018-07-20 ENCOUNTER — Ambulatory Visit (HOSPITAL_COMMUNITY): Payer: Medicare Other

## 2018-07-20 VITALS — BP 107/63 | HR 103 | Temp 97.8°F | Resp 18 | Wt 141.4 lb

## 2018-07-20 DIAGNOSIS — C3491 Malignant neoplasm of unspecified part of right bronchus or lung: Secondary | ICD-10-CM

## 2018-07-20 LAB — CBC WITH DIFFERENTIAL/PLATELET
Basophils Absolute: 0 10*3/uL (ref 0.0–0.1)
Basophils Relative: 0 %
Eosinophils Absolute: 0.3 10*3/uL (ref 0.0–0.7)
Eosinophils Relative: 2 %
HEMATOCRIT: 33 % — AB (ref 39.0–52.0)
HEMOGLOBIN: 10.9 g/dL — AB (ref 13.0–17.0)
LYMPHS ABS: 1.4 10*3/uL (ref 0.7–4.0)
LYMPHS PCT: 10 %
MCH: 30.4 pg (ref 26.0–34.0)
MCHC: 33 g/dL (ref 30.0–36.0)
MCV: 91.9 fL (ref 78.0–100.0)
MONOS PCT: 8 %
Monocytes Absolute: 1.1 10*3/uL — ABNORMAL HIGH (ref 0.1–1.0)
NEUTROS PCT: 80 %
Neutro Abs: 11 10*3/uL — ABNORMAL HIGH (ref 1.7–7.7)
Platelets: 413 10*3/uL — ABNORMAL HIGH (ref 150–400)
RBC: 3.59 MIL/uL — AB (ref 4.22–5.81)
RDW: 14.5 % (ref 11.5–15.5)
WBC: 13.8 10*3/uL — ABNORMAL HIGH (ref 4.0–10.5)

## 2018-07-20 LAB — COMPREHENSIVE METABOLIC PANEL
ALK PHOS: 82 U/L (ref 38–126)
ALT: 27 U/L (ref 0–44)
ANION GAP: 12 (ref 5–15)
AST: 26 U/L (ref 15–41)
Albumin: 3.3 g/dL — ABNORMAL LOW (ref 3.5–5.0)
BILIRUBIN TOTAL: 0.4 mg/dL (ref 0.3–1.2)
BUN: 9 mg/dL (ref 8–23)
CO2: 21 mmol/L — ABNORMAL LOW (ref 22–32)
CREATININE: 0.59 mg/dL — AB (ref 0.61–1.24)
Calcium: 9.5 mg/dL (ref 8.9–10.3)
Chloride: 107 mmol/L (ref 98–111)
Glucose, Bld: 105 mg/dL — ABNORMAL HIGH (ref 70–99)
Potassium: 4.1 mmol/L (ref 3.5–5.1)
Sodium: 140 mmol/L (ref 135–145)
TOTAL PROTEIN: 7.8 g/dL (ref 6.5–8.1)

## 2018-07-20 MED ORDER — HEPARIN SOD (PORK) LOCK FLUSH 100 UNIT/ML IV SOLN
500.0000 [IU] | Freq: Once | INTRAVENOUS | Status: AC | PRN
  Administered 2018-07-20: 500 [IU]

## 2018-07-20 MED ORDER — SODIUM CHLORIDE 0.9 % IV SOLN
10.0000 mg/kg | Freq: Once | INTRAVENOUS | Status: AC
  Administered 2018-07-20: 620 mg via INTRAVENOUS
  Filled 2018-07-20: qty 10

## 2018-07-20 MED ORDER — SODIUM CHLORIDE 0.9% FLUSH
10.0000 mL | INTRAVENOUS | Status: DC | PRN
  Administered 2018-07-20: 10 mL
  Filled 2018-07-20: qty 10

## 2018-07-20 MED ORDER — SODIUM CHLORIDE 0.9 % IV SOLN
Freq: Once | INTRAVENOUS | Status: AC
  Administered 2018-07-20: 11:00:00 via INTRAVENOUS

## 2018-07-20 NOTE — Progress Notes (Signed)
Labs meet parameters today for treatment.   Treatment given per orders. Patient tolerated it well without problems. Vitals stable and discharged home from clinic ambulatory. Follow up as scheduled.

## 2018-07-20 NOTE — Patient Instructions (Signed)
[facility] Discharge Instructions for Patients Receiving Chemotherapy   Beginning January 23rd 2017 lab work for the Cancer Center will be done in the  Main lab at [place] on 1st floor. If you have a lab appointment with the Cancer Center please come in thru the  Main Entrance and check in at the main information desk   Today you received the following chemotherapy agents   To help prevent nausea and vomiting after your treatment, we encourage you to take your nausea medication     If you develop nausea and vomiting, or diarrhea that is not controlled by your medication, call the clinic.  The clinic phone number is [phone]. Office hours are Monday-Friday 8:30am-5:00pm.  BELOW ARE SYMPTOMS THAT SHOULD BE REPORTED IMMEDIATELY:  *FEVER GREATER THAN 101.0 F  *CHILLS WITH OR WITHOUT FEVER  NAUSEA AND VOMITING THAT IS NOT CONTROLLED WITH YOUR NAUSEA MEDICATION  *UNUSUAL SHORTNESS OF BREATH  *UNUSUAL BRUISING OR BLEEDING  TENDERNESS IN MOUTH AND THROAT WITH OR WITHOUT PRESENCE OF ULCERS  *URINARY PROBLEMS  *BOWEL PROBLEMS  UNUSUAL RASH Items with * indicate a potential emergency and should be followed up as soon as possible. If you have an emergency after office hours please contact your primary care physician or go to the nearest emergency department.  Please call the clinic during office hours if you have any questions or concerns.   You may also contact the Patient Navigator at [phone] should you have any questions or need assistance in obtaining follow up care.      Resources For Cancer Patients and their Caregivers ? American Cancer Society: Can assist with transportation, wigs, general needs, runs Look Good Feel Better.        1-888-227-6333 ? Cancer Care: Provides financial assistance, online support groups, medication/co-pay assistance.  1-800-813-HOPE (4673) ? [facility] Assists [place] cancer  patients and their families through emotional , educational and financial support.  336-427-4357 ? [place] DSS Where to apply for food stamps, Medicaid and utility assistance. 336-342-1394 ? [facility]: Transportation to medical appointments. 336-347-2287 ? Social Security Administration: May apply for disability if have a Stage IV cancer. 336-342-7796 1-800-772-1213 ? [place] Aging, Disability and Transit Services: Assists with nutrition, care and transit needs. 336-349-2343         

## 2018-07-24 ENCOUNTER — Ambulatory Visit (INDEPENDENT_AMBULATORY_CARE_PROVIDER_SITE_OTHER): Payer: Medicare Other | Admitting: Orthopaedic Surgery

## 2018-07-24 ENCOUNTER — Other Ambulatory Visit: Payer: Self-pay | Admitting: *Deleted

## 2018-07-24 ENCOUNTER — Encounter: Payer: Self-pay | Admitting: Orthopaedic Surgery

## 2018-07-24 VITALS — BP 119/78 | HR 106 | Ht 68.0 in | Wt 138.0 lb

## 2018-07-24 DIAGNOSIS — I739 Peripheral vascular disease, unspecified: Secondary | ICD-10-CM

## 2018-07-24 DIAGNOSIS — M25511 Pain in right shoulder: Secondary | ICD-10-CM | POA: Diagnosis not present

## 2018-07-24 NOTE — Progress Notes (Signed)
PROCEDURE NOTE:  The patient request injection, verbal consent was obtained.  The right shoulder was prepped appropriately after time out was performed.   Sterile technique was observed and injection of 1 cc of Depo-Medrol 40 mg with several cc's of plain xylocaine. Anesthesia was provided by ethyl chloride and a 20-gauge needle was used to inject the shoulder area. A posterior approach was used.  The injection was tolerated well.  A band aid dressing was applied.  The patient was advised to apply ice later today and tomorrow to the injection sight as needed.  He has seen neurosurgeon for his pain in the back and has been referred to pain management.  I will see in two months for the shoulder.  Call if any problem.  Precautions discussed.   Electronically Signed Sanjuana Kava, MD 9/24/201910:03 AM

## 2018-07-27 ENCOUNTER — Ambulatory Visit (HOSPITAL_COMMUNITY): Payer: Medicare Other

## 2018-07-27 ENCOUNTER — Other Ambulatory Visit (HOSPITAL_COMMUNITY): Payer: Medicare Other

## 2018-07-31 ENCOUNTER — Ambulatory Visit (HOSPITAL_COMMUNITY)
Admission: RE | Admit: 2018-07-31 | Discharge: 2018-07-31 | Disposition: A | Discharge status: Home / Self Care | Payer: Medicare Other | Source: Ambulatory Visit | Attending: Nurse Practitioner | Admitting: Nurse Practitioner

## 2018-07-31 DIAGNOSIS — I2510 Atherosclerotic heart disease of native coronary artery without angina pectoris: Secondary | ICD-10-CM | POA: Insufficient documentation

## 2018-07-31 DIAGNOSIS — J8410 Pulmonary fibrosis, unspecified: Secondary | ICD-10-CM | POA: Diagnosis not present

## 2018-07-31 DIAGNOSIS — J432 Centrilobular emphysema: Secondary | ICD-10-CM | POA: Insufficient documentation

## 2018-07-31 DIAGNOSIS — I700 Atherosclerosis of aorta: Secondary | ICD-10-CM | POA: Diagnosis not present

## 2018-07-31 DIAGNOSIS — C3491 Malignant neoplasm of unspecified part of right bronchus or lung: Secondary | ICD-10-CM | POA: Diagnosis not present

## 2018-07-31 DIAGNOSIS — J984 Other disorders of lung: Secondary | ICD-10-CM | POA: Diagnosis not present

## 2018-07-31 MED ORDER — IOHEXOL 300 MG/ML  SOLN
75.0000 mL | Freq: Once | INTRAMUSCULAR | Status: AC | PRN
  Administered 2018-07-31: 75 mL via INTRAVENOUS

## 2018-08-02 ENCOUNTER — Encounter: Payer: Self-pay | Admitting: Orthopaedic Surgery

## 2018-08-03 ENCOUNTER — Other Ambulatory Visit: Payer: Self-pay

## 2018-08-03 ENCOUNTER — Inpatient Hospital Stay (HOSPITAL_BASED_OUTPATIENT_CLINIC_OR_DEPARTMENT_OTHER): Payer: Medicare Other | Admitting: Hematology

## 2018-08-03 ENCOUNTER — Inpatient Hospital Stay (HOSPITAL_COMMUNITY): Payer: Medicare Other

## 2018-08-03 ENCOUNTER — Encounter (HOSPITAL_COMMUNITY): Payer: Self-pay | Admitting: Hematology

## 2018-08-03 ENCOUNTER — Encounter (HOSPITAL_COMMUNITY): Payer: Self-pay

## 2018-08-03 ENCOUNTER — Inpatient Hospital Stay (HOSPITAL_COMMUNITY): Payer: Medicare Other | Attending: Hematology

## 2018-08-03 VITALS — BP 101/68 | HR 113 | Temp 98.4°F | Resp 16 | Wt 137.3 lb

## 2018-08-03 DIAGNOSIS — Z7982 Long term (current) use of aspirin: Secondary | ICD-10-CM | POA: Diagnosis not present

## 2018-08-03 DIAGNOSIS — E785 Hyperlipidemia, unspecified: Secondary | ICD-10-CM

## 2018-08-03 DIAGNOSIS — Z923 Personal history of irradiation: Secondary | ICD-10-CM | POA: Diagnosis not present

## 2018-08-03 DIAGNOSIS — F17210 Nicotine dependence, cigarettes, uncomplicated: Secondary | ICD-10-CM

## 2018-08-03 DIAGNOSIS — G893 Neoplasm related pain (acute) (chronic): Secondary | ICD-10-CM | POA: Insufficient documentation

## 2018-08-03 DIAGNOSIS — I10 Essential (primary) hypertension: Secondary | ICD-10-CM

## 2018-08-03 DIAGNOSIS — I700 Atherosclerosis of aorta: Secondary | ICD-10-CM | POA: Insufficient documentation

## 2018-08-03 DIAGNOSIS — Z5112 Encounter for antineoplastic immunotherapy: Secondary | ICD-10-CM

## 2018-08-03 DIAGNOSIS — C3491 Malignant neoplasm of unspecified part of right bronchus or lung: Secondary | ICD-10-CM

## 2018-08-03 DIAGNOSIS — Z79899 Other long term (current) drug therapy: Secondary | ICD-10-CM | POA: Diagnosis not present

## 2018-08-03 DIAGNOSIS — Z23 Encounter for immunization: Secondary | ICD-10-CM | POA: Diagnosis not present

## 2018-08-03 DIAGNOSIS — K219 Gastro-esophageal reflux disease without esophagitis: Secondary | ICD-10-CM

## 2018-08-03 DIAGNOSIS — Z7984 Long term (current) use of oral hypoglycemic drugs: Secondary | ICD-10-CM | POA: Diagnosis not present

## 2018-08-03 DIAGNOSIS — I2510 Atherosclerotic heart disease of native coronary artery without angina pectoris: Secondary | ICD-10-CM | POA: Diagnosis not present

## 2018-08-03 DIAGNOSIS — J449 Chronic obstructive pulmonary disease, unspecified: Secondary | ICD-10-CM | POA: Diagnosis not present

## 2018-08-03 DIAGNOSIS — K859 Acute pancreatitis without necrosis or infection, unspecified: Secondary | ICD-10-CM | POA: Diagnosis not present

## 2018-08-03 DIAGNOSIS — E1151 Type 2 diabetes mellitus with diabetic peripheral angiopathy without gangrene: Secondary | ICD-10-CM

## 2018-08-03 DIAGNOSIS — C61 Malignant neoplasm of prostate: Secondary | ICD-10-CM | POA: Insufficient documentation

## 2018-08-03 LAB — COMPREHENSIVE METABOLIC PANEL
ALBUMIN: 3.5 g/dL (ref 3.5–5.0)
ALT: 29 U/L (ref 0–44)
ANION GAP: 10 (ref 5–15)
AST: 25 U/L (ref 15–41)
Alkaline Phosphatase: 86 U/L (ref 38–126)
BILIRUBIN TOTAL: 0.3 mg/dL (ref 0.3–1.2)
BUN: 8 mg/dL (ref 8–23)
CHLORIDE: 105 mmol/L (ref 98–111)
CO2: 22 mmol/L (ref 22–32)
Calcium: 9.9 mg/dL (ref 8.9–10.3)
Creatinine, Ser: 0.6 mg/dL — ABNORMAL LOW (ref 0.61–1.24)
GFR calc Af Amer: >60 mL/min (ref >60)
GFR calc non Af Amer: >60 mL/min (ref >60)
GLUCOSE: 133 mg/dL — AB (ref 70–99)
POTASSIUM: 4.1 mmol/L (ref 3.5–5.1)
Sodium: 137 mmol/L (ref 135–145)
TOTAL PROTEIN: 8.2 g/dL — AB (ref 6.5–8.1)

## 2018-08-03 LAB — CBC WITH DIFFERENTIAL/PLATELET
BASOS ABS: 0.1 10*3/uL (ref 0.0–0.1)
BASOS PCT: 0 %
EOS ABS: 0.2 10*3/uL (ref 0.0–0.7)
Eosinophils Relative: 2 %
HCT: 34.3 % — ABNORMAL LOW (ref 39.0–52.0)
Hemoglobin: 11.2 g/dL — ABNORMAL LOW (ref 13.0–17.0)
Lymphocytes Relative: 10 %
Lymphs Abs: 1.5 10*3/uL (ref 0.7–4.0)
MCH: 29.6 pg (ref 26.0–34.0)
MCHC: 32.7 g/dL (ref 30.0–36.0)
MCV: 90.5 fL (ref 78.0–100.0)
Monocytes Absolute: 1.1 10*3/uL — ABNORMAL HIGH (ref 0.1–1.0)
Monocytes Relative: 8 %
Neutro Abs: 11.9 10*3/uL — ABNORMAL HIGH (ref 1.7–7.7)
Neutrophils Relative %: 80 %
Platelets: 458 10*3/uL — ABNORMAL HIGH (ref 150–400)
RBC: 3.79 MIL/uL — ABNORMAL LOW (ref 4.22–5.81)
RDW: 14.3 % (ref 11.5–15.5)
WBC: 14.8 10*3/uL — AB (ref 4.0–10.5)

## 2018-08-03 MED ORDER — INFLUENZA VAC SPLIT QUAD 0.5 ML IM SUSY
0.5000 mL | PREFILLED_SYRINGE | Freq: Once | INTRAMUSCULAR | Status: AC
  Administered 2018-08-03: 0.5 mL via INTRAMUSCULAR

## 2018-08-03 NOTE — Progress Notes (Signed)
[place], [place] [address]   CLINIC:  Medical Oncology/Hematology  PCP:  Fred Schwalbe, MD [address] [place] [id number] [phone]   REASON FOR VISIT: Follow-up for squamous cell carcinoma of the right lung  CURRENT THERAPY: Imfinzi  BRIEF ONCOLOGIC HISTORY:    Squamous cell carcinoma of right lung ([place])   12/13/2017 PET scan    IMPRESSION: 1. 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 There is early bony invasion of the right second rib and cortical destruction indicating local rib/chest wall invasion by tumor. 2. There is additional pleural thickening and pleural calcification along the right posterior pleural surface which is not separately hypermetabolic. 3. Small mediastinal lymph nodes are not appreciably hypermetabolic. 4. Multifocal and some diffuse regions of high activity in the bowel are likely physiologic. 5. No findings of metastatic disease to the neck, abdomen/pelvis, or to the included remainder of the skeleton. 6. Other imaging findings of potential clinical significance: Chronic right maxillary sinusitis. Aortic Atherosclerosis (ICD10-I70.0) and Emphysema (ICD10-J43.9). Chronic calcific pancreatitis.     01/15/2018 Initial Diagnosis    Squamous cell carcinoma of right lung ([place])    01/15/2018 Initial Biopsy    Lung needle core biopsy RUL: Squamous cell carcinoma    02/19/2018 Procedure    Port-a-cath placement by Dr. Arnoldo Morale     02/20/2018 -  Chemotherapy    The patient had palonosetron (ALOXI) injection 0.25 mg, 0.25 mg, Intravenous,  Once, 1 of 4 cycles Administration: 0.25 mg (02/20/2018) CARBOplatin (PARAPLATIN) 230 mg in sodium chloride 0.9 % 250 mL chemo infusion, 230 mg (100 % of original dose 225.8 mg), Intravenous,  Once, 1 of 4 cycles Dose modification:   (original dose 225.8 mg, Cycle 1) PACLitaxel (TAXOL) 78  mg in dextrose 5 % 250 mL chemo infusion (</= 80mg /m2), 45 mg/m2 = 78 mg, Intravenous,  Once, 1 of 4 cycles Administration: 78 mg (02/20/2018)  for chemotherapy treatment.     05/06/2018 -  Chemotherapy    The patient had durvalumab (IMFINZI) 620 mg in sodium chloride 0.9 % 100 mL chemo infusion, 10 mg/kg = 620 mg, Intravenous,  Once, 6 of 12 cycles Administration: 620 mg (05/11/2018), 620 mg (05/25/2018), 620 mg (06/08/2018), 620 mg (06/22/2018), 620 mg (07/06/2018), 620 mg (07/20/2018)  for chemotherapy treatment.      Adenocarcinoma of prostate ([place])   10/24/2015 Tumor Marker    PSA 1.53    01/06/2016 Tumor Marker    PSA 1.6    07/31/2017 Tumor Marker    PSA 4.6    01/17/2018 Initial Diagnosis    Adenocarcinoma of prostate ([place])    01/17/2018 Procedure    12-core prostate biopsy revealed 6/12 cores positive for prostate adenocarcinoma (Dr. [facility]).  (L) base lat: Gleason 9 (5+4), 20%. (L) mid lat: Gleason 9 (4+5), 40%. (L) apex lat: Gleason 9 (4+5), 20%. (L) base: Gleason 9 (5+4), 20%. (L) mid: Gleason 10 (5+5), 5%. (L) apex: Gleason 9 (5+4), 9%.         INTERVAL HISTORY:  Fred Hernandez 62 y.o. male returns for routine follow-up for squamous cell lung cancer. Patient is here today with his girlfriend. He has complaints of increasing right sided rib pain. He has had the pain ever since diagnosis however he feels the pain is worse. He has been seen by his primary care doctor that has referred him for pain management. He will see  that doctor next week in [place]. He reports his appetite is at 100% and he drinks ensure daily to help maintain his weight. He denies any nausea, vomiting, or diarrhea. Denies any cough or SOB. Denies any hemoptysis.  He is wanting to hold off on his treatment for this week and start back next week due to vehicle issues and they had to ask for a ride this morning.    REVIEW OF SYSTEMS:  Review of Systems  Musculoskeletal: Positive for back pain (right  sided rib pain).  All other systems reviewed and are negative.    PAST MEDICAL/SURGICAL HISTORY:  Past Medical History:  Diagnosis Date  . Bursitis   . CAD (coronary artery disease)     s/p stent placement unknown artery 12/2014,  . Cancer [facility])    prostate cancer  . COPD (chronic obstructive pulmonary disease) ([place])   . Diabetes mellitus ([place])   . GERD (gastroesophageal reflux disease)   . History of prostate cancer   . Hyperlipemia   . Hypertension   . Peripheral arterial disease ([place])    a. h/o stents. b. 03/2016  - successful PTA and covered stenting using overlapping Viabahn covered stents of a long segment in-stent restenosis of previously placed nitinol self expanding stents in the proximal mid and distal right SFA.  . Tobacco abuse    Past Surgical History:  Procedure Laterality Date  . COLONOSCOPY WITH PROPOFOL N/A 11/22/2016   Procedure: COLONOSCOPY WITH PROPOFOL;  Surgeon: [name], MD;  Location: [facility] END[facility];  Service: Endoscopy;  Laterality: N/A;  10:00 am  . CORONARY STENT PLACEMENT  12-2014   [facility] [employer]  . FEMORAL ARTERY STENT Bilateral 03/2013  . PERIPHERAL VASCULAR CATHETERIZATION N/A 04/04/2016   Procedure: Lower Extremity Angiography;  Surgeon: Lorretta Harp, MD;  Location: [facility] CV LAB;  Service: Cardiovascular;  Laterality: N/A;  . PERIPHERAL VASCULAR CATHETERIZATION N/A 04/04/2016   Procedure: Abdominal Aortogram;  Surgeon: Lorretta Harp, MD;  Location: [facility] CV LAB;  Service: Cardiovascular;  Laterality: N/A;  . PERIPHERAL VASCULAR CATHETERIZATION  04/04/2016   Procedure: Peripheral Vascular Intervention;  Surgeon: Lorretta Harp, MD;  Location: [facility] CV LAB;  Service: Cardiovascular;;  rt SFA stent  . POLYPECTOMY  11/22/2016   Procedure: POLYPECTOMY;  Surgeon: [name], MD;  Location: [facility];  Service: Endoscopy;;  descending colon polyp, transverse colon polyp, sigmoid colon polyp, rectal polyp    . PORTACATH PLACEMENT Left 02/19/2018   Procedure: INSERTION PORT-A-CATH;  Surgeon: Aviva Signs, MD;  Location: [facility] ORS;  Service: General;  Laterality: Left;  . STENT PLACEMENT VASCULAR ([place] [facility])  2013     SOCIAL HISTORY:  Social History   Socioeconomic History  . Marital status: Single    Spouse name: Not on file  . Number of children: Not on file  . Years of education: Not on file  . Highest education level: Not on file  Occupational History  . Not on file  Social Needs  . Financial resource strain: Not on file  . Food insecurity:    Worry: Not on file    Inability: Not on file  . Transportation needs:    Medical: Not on file    Non-medical: Not on file  Tobacco Use  . Smoking status: Current Every Day Smoker    Packs/day: 0.25    Years: 45.00    Pack years: 11.25    Types: Cigarettes    Start date: 02/12/1971  .  Smokeless tobacco: Never Used  Substance and Sexual Activity  . Alcohol use: Not Currently    Alcohol/week: 0.0 standard drinks    Comment: stopped 2 weeks ago.  . Drug use: No  . Sexual activity: Yes  Lifestyle  . Physical activity:    Days per week: Not on file    Minutes per session: Not on file  . Stress: Not on file  Relationships  . Social connections:    Talks on phone: Not on file    Gets together: Not on file    Attends religious service: Not on file    Active member of club or organization: Not on file    Attends meetings of clubs or organizations: Not on file    Relationship status: Not on file  . Intimate partner violence:    Fear of current or ex partner: Not on file    Emotionally abused: Not on file    Physically abused: Not on file    Forced sexual activity: Not on file  Other Topics Concern  . Not on file  Social History Narrative  . Not on file    FAMILY HISTORY:  Family History  Problem Relation Age of Onset  . Diabetes Mother   . Heart disease Mother   . Hypertension Mother   . Healthy Brother   . Other Brother         accident   . Arthritis Sister   . Arthritis Sister   . SIDS Brother   . Colon cancer Neg Hx     CURRENT MEDICATIONS:  Outpatient Encounter Medications as of 08/03/2018  Medication Sig Note  . aspirin 81 MG chewable tablet Chew 81 mg by mouth daily. 02/14/2018: Last dose 02/13/2018. On hold for surgery.  [name] atorvastatin (LIPITOR) 80 MG tablet Take 80 mg by mouth at bedtime.    [name] CARBOPLATIN IV Inject into the vein. 6 weekly treatments with radiation   . cilostazol (PLETAL) 50 MG tablet TAKE 1 TABLET(50 MG) BY MOUTH TWICE DAILY   . Coenzyme Q10 100 MG capsule Take 100 mg by mouth at bedtime.    . Diphenhyd-Hydrocort-Nystatin (FIRST-DUKES MOUTHWASH MT) Take by mouth.   . dronabinol (MARINOL) 5 MG capsule Take 1 capsule (5 mg total) by mouth 2 (two) times daily before a meal.   . FLOVENT HFA 110 MCG/ACT inhaler Inhale 1 puff into the lungs 2 (two) times daily.   . fluticasone (FLONASE) 50 MCG/ACT nasal spray Place 1 spray into both nostrils daily.   [name] HYDROcodone-acetaminophen (NORCO) 7.5-325 MG tablet One tablet as needed twice a day for pain.   . hydrOXYzine (ATARAX/VISTARIL) 50 MG tablet Take by mouth.   [name] ipratropium-albuterol (DUONEB) 0.5-2.5 (3) MG/3ML SOLN Take 3 mLs by nebulization every 6 (six) hours as needed (for shortness of breath).   . lidocaine-prilocaine (EMLA) cream Apply a quarter size amount to affected area 1 hour prior to coming to chemotherapy.  Do not rub in.  Cover with plastic wrap.   . megestrol (MEGACE) 20 MG tablet Take by mouth.   . metFORMIN (GLUCOPHAGE) 500 MG tablet Take 500 mg by mouth 2 (two) times daily.    . methocarbamol (ROBAXIN) 500 MG tablet    . metoprolol succinate (TOPROL-XL) 50 MG 24 hr tablet Take 50 mg by mouth at bedtime.    [name] nystatin (MYCOSTATIN) 100000 UNIT/ML suspension    . Omega-3 Fatty Acids (FISH OIL) 1000 MG CAPS Take 1 capsule by mouth 2 (two) times daily.    [name]  ondansetron (ZOFRAN) 8 MG tablet Take 1 tablet (8 mg total) by mouth every  8 (eight) hours as needed for nausea or vomiting.   [name] PACLitaxel (TAXOL IV) Inject into the vein. 6 weekly treatments with radiation   . pantoprazole (PROTONIX) 40 MG tablet Take 40 mg by mouth 2 (two) times daily.   . prochlorperazine (COMPAZINE) 10 MG tablet Take 1 tablet (10 mg total) by mouth every 6 (six) hours as needed for nausea or vomiting.   . triamcinolone cream (KENALOG) 0.1 % Apply topically 2 (two) times daily.    No facility-administered encounter medications on file as of 08/03/2018.     ALLERGIES:  Allergies  Allergen Reactions  . Lisinopril Anaphylaxis  . Penicillins Anaphylaxis    Has patient had a PCN reaction causing immediate rash, facial/tongue/throat swelling, SOB or lightheadedness with hypotension:Yes Has patient had a PCN reaction causing severe rash involving mucus membranes or skin necrosis:unsure Has patient had a PCN reaction that required hospitalization:unsure Has patient had a PCN reaction occurring within the last 10 years:~10 years per patient If all of the above answers are "NO", then may proceed with Cephalosporin use.   . Ace Inhibitors Swelling     PHYSICAL EXAM:  ECOG Performance status: 1  Vitals:   08/03/18 1106  BP: 101/68  Pulse: (!) 113  Resp: 16  Temp: 98.4 F (36.9 C)  SpO2: 99%   Filed Weights   08/03/18 1106  Weight: 137 lb 4.8 oz (62.3 kg)    Physical Exam  Constitutional: He is oriented to person, place, and time. He appears well-developed and well-nourished.  Cardiovascular: Normal rate, regular rhythm and normal heart sounds.  Pulmonary/Chest: Effort normal and breath sounds normal.  Musculoskeletal: Normal range of motion.  Neurological: He is alert and oriented to person, place, and time.  Skin: Skin is warm and dry.  Psychiatric: He has a normal mood and affect. His behavior is normal. Judgment and thought content normal.     LABORATORY DATA:  I have reviewed the labs as listed.  CBC    Component Value  Date/Time   WBC 14.8 (H) 08/03/2018 0940   RBC 3.79 (L) 08/03/2018 0940   HGB 11.2 (L) 08/03/2018 0940   HCT 34.3 (L) 08/03/2018 0940   PLT 458 (H) 08/03/2018 0940   MCV 90.5 08/03/2018 0940   MCH 29.6 08/03/2018 0940   MCHC 32.7 08/03/2018 0940   RDW 14.3 08/03/2018 0940   LYMPHSABS 1.5 08/03/2018 0940   MONOABS 1.1 (H) 08/03/2018 0940   EOSABS 0.2 08/03/2018 0940   BASOSABS 0.1 08/03/2018 0940   CMP Latest Ref Rng & Units 08/03/2018 07/20/2018 07/06/2018  Glucose 70 - 99 mg/dL 133(H) 105(H) 108(H)  BUN 8 - 23 mg/dL 8 9 16   Creatinine 0.61 - 1.24 mg/dL 0.60(L) 0.59(L) 0.56(L)  Sodium 135 - 145 mmol/L 137 140 141  Potassium 3.5 - 5.1 mmol/L 4.1 4.1 4.0  Chloride 98 - 111 mmol/L 105 107 111  CO2 22 - 32 mmol/L 22 21(L) 19(L)  Calcium 8.9 - 10.3 mg/dL 9.9 9.5 9.5  Total Protein 6.5 - 8.1 g/dL 8.2(H) 7.8 8.1  Total Bilirubin 0.3 - 1.2 mg/dL 0.3 0.4 0.4  Alkaline Phos 38 - 126 U/L 86 82 87  AST 15 - 41 U/L 25 26 28   ALT 0 - 44 U/L 29 27 31        DIAGNOSTIC IMAGING:  I have personally reviewed images of the CT scan of the chest and compared with the  prior scans and showed to the patient.     ASSESSMENT & PLAN:   Squamous cell carcinoma of right lung ([place]) 1.  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 -Patient evaluated by Dr. Roxan Hockey and was recommended to have preoperative chemoradiation therapy. -Chemoradiation therapy with carboplatin and paclitaxel from 02/20/2018 through 04/04/2018. -I have reviewed the results of the CT scan of the chest dated 04/27/2018 which showed regression of the tumor which is sub-solid in appearance.  No new areas were seen. - He was started on consolidation Durvalumab on 05/03/2018.  He is tolerating it very well. -We discussed the results of the CT scan of the chest with contrast dated 07/31/2018 which shows slight interval  worsening of destruction of the posterolateral right second and third ribs.  There was also mention of slight worsening by few millimeters of the right apical lung mass. - He complained of worsening of pain in the right chest wall for the last few weeks.  He is currently taking hydrocodone 7.5 mg given by Dr. Luna Glasgow as needed for pain.  He had to take [facility] powder with it as hydrocodone by itself sometimes does not control it.  He is also scheduled to see pain management sometime in November. - We will schedule him for a PET CT scan to see if this is real progression.  2.  Weight loss: His weight is stable since last visit.  3.  Recurrent prostate cancer: He had a history of prostate cancer and reportedly received radiation therapy 6-7 years ago.  He had prostate biopsies on 01/17/2018 which showed recurrence with a Gleason score (5+5)=10.  He will be treated with GnRH agonist.  4.  Cancer related pain: -He had slight worsening of right upper back/shoulder pain in the last few weeks.  He had this pain since the diagnosis.  He is taking hydrocodone 7.5 mg as needed.      Orders placed this encounter:  Orders Placed This Encounter  Procedures  . NM PET Image Restag (PS) Skull Base To Thigh  . CBC with Differential/Platelet  . Comprehensive metabolic panel      Derek Jack, MD [place] [phone]

## 2018-08-03 NOTE — Patient Instructions (Signed)
[place] at [facility] Discharge Instructions  We wil set you up for a PET scan and then we will see you back afterwards.   Thank you for choosing [place] at [facility] to provide your oncology and hematology care.  To afford each patient quality time with our provider, please arrive at least 15 minutes before your scheduled appointment time.   If you have a lab appointment with the [facility] please come in thru the  Main Entrance and check in at the main information desk  You need to re-schedule your appointment should you arrive 10 or more minutes late.  We strive to give you quality time with our providers, and arriving late affects you and other patients whose appointments are after yours.  Also, if you no show three or more times for appointments you may be dismissed from the clinic at the providers discretion.     Again, thank you for choosing [facility].  Our hope is that these requests will decrease the amount of time that you wait before being seen by our physicians.       _____________________________________________________________  Should you have questions after your visit to [facility], please contact our office at [phone] between the hours of 8:00 a.m. and 4:30 p.m.  Voicemails left after 4:00 p.m. will not be returned until the following business day.  For prescription refill requests, have your pharmacy contact our office and allow 72 hours.    Cancer Center Support Programs:   > Cancer Support Group  2nd Tuesday of the month 1pm-2pm, Journey Room

## 2018-08-03 NOTE — Progress Notes (Signed)
Patient rescheduled due to transportation issues.

## 2018-08-03 NOTE — Progress Notes (Signed)
Nutrition  Planned nutrition follow-up during infusion today but patient could not stay for infusion due to car issues.  Will follow-up as able.  Darrian Grzelak B. Zenia Resides, [place], [place] Registered Dietitian [phone] (pager)

## 2018-08-03 NOTE — Assessment & Plan Note (Signed)
1.  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 -Patient evaluated by Dr. Roxan Hockey and was recommended to have preoperative chemoradiation therapy. -Chemoradiation therapy with carboplatin and paclitaxel from 02/20/2018 through 04/04/2018. -I have reviewed the results of the CT scan of the chest dated 04/27/2018 which showed regression of the tumor which is sub-solid in appearance.  No new areas were seen. - He was started on consolidation Durvalumab on 05/03/2018.  He is tolerating it very well. -We discussed the results of the CT scan of the chest with contrast dated 07/31/2018 which shows slight interval worsening of destruction of the posterolateral right second and third ribs.  There was also mention of slight worsening by few millimeters of the right apical lung mass. - He complained of worsening of pain in the right chest wall for the last few weeks.  He is currently taking hydrocodone 7.5 mg given by Dr. Luna Glasgow as needed for pain.  He had to take [facility] powder with it as hydrocodone by itself sometimes does not control it.  He is also scheduled to see pain management sometime in November. - We will schedule him for a PET CT scan to see if this is real progression.  2.  Weight loss: His weight is stable since last visit.  3.  Recurrent prostate cancer: He had a history of prostate cancer and reportedly received radiation therapy 6-7 years ago.  He had prostate biopsies on 01/17/2018 which showed recurrence with a Gleason score (5+5)=10.  He will be treated with GnRH agonist.  4.  Cancer related pain: -He had slight worsening of right upper back/shoulder pain in the last few weeks.  He had this pain since the diagnosis.  He is taking hydrocodone 7.5 mg as needed.

## 2018-08-08 ENCOUNTER — Other Ambulatory Visit: Payer: Self-pay | Admitting: Cardiovascular Disease

## 2018-08-08 ENCOUNTER — Inpatient Hospital Stay (HOSPITAL_COMMUNITY): Payer: Medicare Other

## 2018-08-08 ENCOUNTER — Encounter (HOSPITAL_COMMUNITY): Payer: Self-pay

## 2018-08-08 ENCOUNTER — Telehealth: Payer: Self-pay | Admitting: Orthopaedic Surgery

## 2018-08-08 VITALS — BP 119/72 | HR 112 | Temp 98.7°F | Resp 18 | Wt 136.2 lb

## 2018-08-08 DIAGNOSIS — C61 Malignant neoplasm of prostate: Secondary | ICD-10-CM | POA: Diagnosis not present

## 2018-08-08 DIAGNOSIS — I739 Peripheral vascular disease, unspecified: Secondary | ICD-10-CM

## 2018-08-08 DIAGNOSIS — C3491 Malignant neoplasm of unspecified part of right bronchus or lung: Secondary | ICD-10-CM

## 2018-08-08 MED ORDER — SODIUM CHLORIDE 0.9 % IV SOLN
Freq: Once | INTRAVENOUS | Status: AC
  Administered 2018-08-08: 11:00:00 via INTRAVENOUS

## 2018-08-08 MED ORDER — HYDROCODONE-ACETAMINOPHEN 7.5-325 MG PO TABS: ORAL_TABLET | ORAL | 0 refills | Status: DC

## 2018-08-08 MED ORDER — HEPARIN SOD (PORK) LOCK FLUSH 100 UNIT/ML IV SOLN
500.0000 [IU] | Freq: Once | INTRAVENOUS | Status: AC | PRN
  Administered 2018-08-08: 500 [IU]

## 2018-08-08 MED ORDER — SODIUM CHLORIDE 0.9 % IV SOLN
10.0000 mg/kg | Freq: Once | INTRAVENOUS | Status: AC
  Administered 2018-08-08: 620 mg via INTRAVENOUS
  Filled 2018-08-08: qty 10

## 2018-08-08 MED ORDER — SODIUM CHLORIDE 0.9% FLUSH
10.0000 mL | INTRAVENOUS | Status: DC | PRN
  Administered 2018-08-08: 10 mL
  Filled 2018-08-08: qty 10

## 2018-08-08 NOTE — Progress Notes (Signed)
Fred Hernandez tolerated Imfinzi infusion well without complaints or incident. Labs reviewed prior to administering this medication. VSS upon discharge. Pt discharged self ambulatory in satisfactory condition accompanied by his wife

## 2018-08-08 NOTE — Patient Instructions (Signed)
[facility] Discharge Instructions for Patients Receiving Chemotherapy   Beginning January 23rd 2017 lab work for the [facility] will be done in the  Main lab at [facility] on 1st floor. If you have a lab appointment with the [facility] please come in thru the  Main Entrance and check in at the main information desk   Today you received the following chemotherapy agents Imfinzi. Follow-up as scheduled. Call clinic for any questions or concerns  To help prevent nausea and vomiting after your treatment, we encourage you to take your nausea medication   If you develop nausea and vomiting, or diarrhea that is not controlled by your medication, call the clinic.  The clinic phone number is [phone]. Office hours are Monday-Friday 8:30am-5:00pm.  BELOW ARE SYMPTOMS THAT SHOULD BE REPORTED IMMEDIATELY:  *FEVER GREATER THAN 101.0 F  *CHILLS WITH OR WITHOUT FEVER  NAUSEA AND VOMITING THAT IS NOT CONTROLLED WITH YOUR NAUSEA MEDICATION  *UNUSUAL SHORTNESS OF BREATH  *UNUSUAL BRUISING OR BLEEDING  TENDERNESS IN MOUTH AND THROAT WITH OR WITHOUT PRESENCE OF ULCERS  *URINARY PROBLEMS  *BOWEL PROBLEMS  UNUSUAL RASH Items with * indicate a potential emergency and should be followed up as soon as possible. If you have an emergency after office hours please contact your primary care physician or go to the nearest emergency department.  Please call the clinic during office hours if you have any questions or concerns.   You may also contact the Patient Navigator at [phone] should you have any questions or need assistance in obtaining follow up care.      Resources For Cancer Patients and their Caregivers ? American Cancer Society: Can assist with transportation, wigs, general needs, runs Look Good Feel Better.        769 235 8261 ? Cancer Care: Provides financial assistance, online support groups, medication/co-pay assistance.  1-800-813-HOPE  305-562-7817) ? [facility] Assists [place] cancer patients and their families through emotional , educational and financial support.  6146714500 ? [place] DSS Where to apply for food stamps, Medicaid and utility assistance. 651-561-3409 ? [facility]: Transportation to medical appointments. (647) 596-9177 ? Social Security Administration: May apply for disability if have a Stage IV cancer. 408-012-7571 231-377-2845 ? [employer], Disability and Transit Services: Assists with nutrition, care and transit needs. [phone]

## 2018-08-08 NOTE — Telephone Encounter (Signed)
Patient requests refill on Hydrocodone/Acetaminophen 7.5-325  Mgs.  Qty  60  Sig: One tablet as needed twice a day for pain.  Patient states he uses [facility] in [place], [place]

## 2018-08-09 NOTE — Telephone Encounter (Signed)
Rx request sent to pharmacy.  

## 2018-08-10 ENCOUNTER — Telehealth (HOSPITAL_COMMUNITY): Payer: Self-pay

## 2018-08-10 NOTE — Telephone Encounter (Signed)
Nutrition Follow-up: Patient with lung cancer and receiving durvalumab.    Called patient this am as unable to speak with him last week as planned.  Patient reports appetite has been "messed up" due to worrying about car also complains of pain effecting his appetite.  Reports planning MRI on Monday and PET scan on 10/18.  Continues to drink ensure.  Medications: reviewed  Labs: reviewed  Anthropometrics:   Weight decreased to 136 lb 3.2 oz on 10/9 from 141 lb 9.6 oz on 9/6.     NUTRITION DIAGNOSIS: severe malnutrition continues   MALNUTRITION DIAGNOSIS: severe malnutrition continues   INTERVENTION:  Discussed importance of good nutrition and maintaining weight. Encouraged patient to eat every 2 hours. Encouraged high calorie, high protein foods Patient requesting more ensure enlive.  Will place box at the front desk for patient to pick up.  Next appt on 10/23.      MONITORING, EVALUATION, GOAL: weight trends, po intake   NEXT VISIT: phone follow-up November 22  Granger Chui B. Zenia Resides, [place], [place] Registered Dietitian [phone] (pager)

## 2018-08-17 ENCOUNTER — Other Ambulatory Visit (HOSPITAL_COMMUNITY): Payer: Medicare Other

## 2018-08-17 ENCOUNTER — Encounter (HOSPITAL_COMMUNITY): Payer: Medicare Other

## 2018-08-17 ENCOUNTER — Ambulatory Visit (HOSPITAL_COMMUNITY): Payer: Medicare Other

## 2018-08-22 ENCOUNTER — Inpatient Hospital Stay (HOSPITAL_COMMUNITY): Payer: Medicare Other

## 2018-08-22 ENCOUNTER — Encounter (HOSPITAL_COMMUNITY): Payer: Self-pay

## 2018-08-22 VITALS — BP 92/61 | HR 96 | Temp 97.7°F | Resp 16 | Wt 139.8 lb

## 2018-08-22 DIAGNOSIS — C3491 Malignant neoplasm of unspecified part of right bronchus or lung: Secondary | ICD-10-CM

## 2018-08-22 DIAGNOSIS — C61 Malignant neoplasm of prostate: Secondary | ICD-10-CM | POA: Diagnosis not present

## 2018-08-22 LAB — CBC WITH DIFFERENTIAL/PLATELET
Abs Immature Granulocytes: 0.26 10*3/uL — ABNORMAL HIGH (ref 0.00–0.07)
Basophils Absolute: 0.1 10*3/uL (ref 0.0–0.1)
Basophils Relative: 1 %
EOS ABS: 0.3 10*3/uL (ref 0.0–0.5)
Eosinophils Relative: 1 %
HCT: 34.5 % — ABNORMAL LOW (ref 39.0–52.0)
HEMOGLOBIN: 10.8 g/dL — AB (ref 13.0–17.0)
Immature Granulocytes: 1 %
LYMPHS ABS: 1 10*3/uL (ref 0.7–4.0)
Lymphocytes Relative: 5 %
MCH: 28.8 pg (ref 26.0–34.0)
MCHC: 31.3 g/dL (ref 30.0–36.0)
MCV: 92 fL (ref 80.0–100.0)
Monocytes Absolute: 1.4 10*3/uL — ABNORMAL HIGH (ref 0.1–1.0)
Monocytes Relative: 7 %
NRBC: 0 % (ref 0.0–0.2)
Neutro Abs: 16.5 10*3/uL — ABNORMAL HIGH (ref 1.7–7.7)
Neutrophils Relative %: 85 %
Platelets: 477 10*3/uL — ABNORMAL HIGH (ref 150–400)
RBC: 3.75 MIL/uL — ABNORMAL LOW (ref 4.22–5.81)
RDW: 15.3 % (ref 11.5–15.5)
WBC: 19.4 10*3/uL — ABNORMAL HIGH (ref 4.0–10.5)

## 2018-08-22 LAB — COMPREHENSIVE METABOLIC PANEL
ALBUMIN: 3.3 g/dL — AB (ref 3.5–5.0)
ALT: 18 U/L (ref 0–44)
AST: 14 U/L — AB (ref 15–41)
Alkaline Phosphatase: 79 U/L (ref 38–126)
Anion gap: 10 (ref 5–15)
BUN: 14 mg/dL (ref 8–23)
CHLORIDE: 102 mmol/L (ref 98–111)
CO2: 23 mmol/L (ref 22–32)
CREATININE: 0.54 mg/dL — AB (ref 0.61–1.24)
Calcium: 10.1 mg/dL (ref 8.9–10.3)
GFR calc Af Amer: >60 mL/min (ref >60)
GFR calc non Af Amer: >60 mL/min (ref >60)
GLUCOSE: 139 mg/dL — AB (ref 70–99)
Potassium: 4.6 mmol/L (ref 3.5–5.1)
SODIUM: 135 mmol/L (ref 135–145)
Total Bilirubin: 0.2 mg/dL — ABNORMAL LOW (ref 0.3–1.2)
Total Protein: 7.5 g/dL (ref 6.5–8.1)

## 2018-08-22 MED ORDER — HEPARIN SOD (PORK) LOCK FLUSH 100 UNIT/ML IV SOLN
500.0000 [IU] | Freq: Once | INTRAVENOUS | Status: AC | PRN
  Administered 2018-08-22: 500 [IU]

## 2018-08-22 MED ORDER — SODIUM CHLORIDE 0.9 % IV SOLN
10.0000 mg/kg | Freq: Once | INTRAVENOUS | Status: AC
  Administered 2018-08-22: 620 mg via INTRAVENOUS
  Filled 2018-08-22: qty 10

## 2018-08-22 MED ORDER — SODIUM CHLORIDE 0.9 % IV SOLN
Freq: Once | INTRAVENOUS | Status: AC
  Administered 2018-08-22: 10:00:00 via INTRAVENOUS

## 2018-08-22 MED ORDER — SODIUM CHLORIDE 0.9% FLUSH
10.0000 mL | INTRAVENOUS | Status: DC | PRN
  Administered 2018-08-22: 10 mL
  Filled 2018-08-22: qty 10

## 2018-08-22 NOTE — Progress Notes (Signed)
Patient tolerated treatment with no complaints voiced.  Port site clean and dry with good blood return noted before and after administration of therapy.  Band aid applied.  VSs with discharge and left ambulatory with family with no s/s of distress noted.

## 2018-08-22 NOTE — Patient Instructions (Signed)
[facility] Discharge Instructions for Patients Receiving Chemotherapy  Today you received the following chemotherapy agents imfinzi   If you develop nausea and vomiting that is not controlled by your nausea medication, call the clinic.   BELOW ARE SYMPTOMS THAT SHOULD BE REPORTED IMMEDIATELY:  *FEVER GREATER THAN 100.5 F  *CHILLS WITH OR WITHOUT FEVER  NAUSEA AND VOMITING THAT IS NOT CONTROLLED WITH YOUR NAUSEA MEDICATION  *UNUSUAL SHORTNESS OF BREATH  *UNUSUAL BRUISING OR BLEEDING  TENDERNESS IN MOUTH AND THROAT WITH OR WITHOUT PRESENCE OF ULCERS  *URINARY PROBLEMS  *BOWEL PROBLEMS  UNUSUAL RASH Items with * indicate a potential emergency and should be followed up as soon as possible.  Feel free to call the clinic should you have any questions or concerns. The clinic phone number is [phone].  Please show the [facility] at check-in to the Emergency Department and triage nurse.

## 2018-08-30 ENCOUNTER — Encounter (HOSPITAL_COMMUNITY)
Admission: RE | Admit: 2018-08-30 | Discharge: 2018-08-30 | Disposition: A | Discharge status: Home / Self Care | Payer: Medicare Other | Source: Ambulatory Visit | Attending: Nurse Practitioner | Admitting: Nurse Practitioner

## 2018-08-30 DIAGNOSIS — C3491 Malignant neoplasm of unspecified part of right bronchus or lung: Secondary | ICD-10-CM | POA: Diagnosis present

## 2018-08-30 LAB — GLUCOSE, CAPILLARY: Glucose-Capillary: 105 mg/dL — ABNORMAL HIGH (ref 70–99)

## 2018-08-30 MED ORDER — FLUDEOXYGLUCOSE F - 18 (FDG) INJECTION
6.9300 | Freq: Once | INTRAVENOUS | Status: AC | PRN
  Administered 2018-08-30: 6.93 via INTRAVENOUS

## 2018-08-31 ENCOUNTER — Ambulatory Visit (HOSPITAL_COMMUNITY): Payer: Medicare Other

## 2018-08-31 ENCOUNTER — Other Ambulatory Visit (HOSPITAL_COMMUNITY): Payer: Medicare Other

## 2018-09-04 ENCOUNTER — Telehealth: Payer: Self-pay | Admitting: Orthopaedic Surgery

## 2018-09-04 MED ORDER — HYDROCODONE-ACETAMINOPHEN 7.5-325 MG PO TABS: ORAL_TABLET | ORAL | 0 refills | Status: DC

## 2018-09-04 NOTE — Telephone Encounter (Signed)
Hydrocodone-Acetaminophen  7.5/325 mg  Qty 60 Tablets  PATIENT [place], [place] ([place])

## 2018-09-05 ENCOUNTER — Other Ambulatory Visit: Payer: Self-pay

## 2018-09-05 ENCOUNTER — Encounter (HOSPITAL_COMMUNITY): Payer: Self-pay

## 2018-09-05 ENCOUNTER — Inpatient Hospital Stay (HOSPITAL_COMMUNITY): Payer: Medicare Other

## 2018-09-05 ENCOUNTER — Inpatient Hospital Stay (HOSPITAL_COMMUNITY): Payer: Medicare Other | Attending: Hematology

## 2018-09-05 VITALS — BP 99/68 | HR 92 | Temp 97.6°F | Resp 18 | Wt 134.0 lb

## 2018-09-05 DIAGNOSIS — Z923 Personal history of irradiation: Secondary | ICD-10-CM | POA: Diagnosis not present

## 2018-09-05 DIAGNOSIS — Z79899 Other long term (current) drug therapy: Secondary | ICD-10-CM | POA: Insufficient documentation

## 2018-09-05 DIAGNOSIS — Z9221 Personal history of antineoplastic chemotherapy: Secondary | ICD-10-CM | POA: Insufficient documentation

## 2018-09-05 DIAGNOSIS — I10 Essential (primary) hypertension: Secondary | ICD-10-CM | POA: Insufficient documentation

## 2018-09-05 DIAGNOSIS — J449 Chronic obstructive pulmonary disease, unspecified: Secondary | ICD-10-CM | POA: Insufficient documentation

## 2018-09-05 DIAGNOSIS — M25511 Pain in right shoulder: Secondary | ICD-10-CM | POA: Diagnosis not present

## 2018-09-05 DIAGNOSIS — C3411 Malignant neoplasm of upper lobe, right bronchus or lung: Secondary | ICD-10-CM | POA: Diagnosis present

## 2018-09-05 DIAGNOSIS — K861 Other chronic pancreatitis: Secondary | ICD-10-CM | POA: Diagnosis not present

## 2018-09-05 DIAGNOSIS — E785 Hyperlipidemia, unspecified: Secondary | ICD-10-CM | POA: Diagnosis not present

## 2018-09-05 DIAGNOSIS — R531 Weakness: Secondary | ICD-10-CM | POA: Diagnosis not present

## 2018-09-05 DIAGNOSIS — Z7982 Long term (current) use of aspirin: Secondary | ICD-10-CM | POA: Diagnosis not present

## 2018-09-05 DIAGNOSIS — F17210 Nicotine dependence, cigarettes, uncomplicated: Secondary | ICD-10-CM | POA: Insufficient documentation

## 2018-09-05 DIAGNOSIS — Z7984 Long term (current) use of oral hypoglycemic drugs: Secondary | ICD-10-CM | POA: Insufficient documentation

## 2018-09-05 DIAGNOSIS — R599 Enlarged lymph nodes, unspecified: Secondary | ICD-10-CM | POA: Insufficient documentation

## 2018-09-05 DIAGNOSIS — Z5112 Encounter for antineoplastic immunotherapy: Secondary | ICD-10-CM | POA: Diagnosis not present

## 2018-09-05 DIAGNOSIS — C3491 Malignant neoplasm of unspecified part of right bronchus or lung: Secondary | ICD-10-CM

## 2018-09-05 DIAGNOSIS — C61 Malignant neoplasm of prostate: Secondary | ICD-10-CM | POA: Diagnosis not present

## 2018-09-05 DIAGNOSIS — J329 Chronic sinusitis, unspecified: Secondary | ICD-10-CM | POA: Insufficient documentation

## 2018-09-05 DIAGNOSIS — E1151 Type 2 diabetes mellitus with diabetic peripheral angiopathy without gangrene: Secondary | ICD-10-CM | POA: Diagnosis not present

## 2018-09-05 DIAGNOSIS — I2510 Atherosclerotic heart disease of native coronary artery without angina pectoris: Secondary | ICD-10-CM | POA: Insufficient documentation

## 2018-09-05 DIAGNOSIS — I700 Atherosclerosis of aorta: Secondary | ICD-10-CM | POA: Diagnosis not present

## 2018-09-05 DIAGNOSIS — K219 Gastro-esophageal reflux disease without esophagitis: Secondary | ICD-10-CM | POA: Insufficient documentation

## 2018-09-05 DIAGNOSIS — G893 Neoplasm related pain (acute) (chronic): Secondary | ICD-10-CM | POA: Diagnosis not present

## 2018-09-05 LAB — CBC WITH DIFFERENTIAL/PLATELET
Abs Immature Granulocytes: 0.08 10*3/uL — ABNORMAL HIGH (ref 0.00–0.07)
BASOS PCT: 0 %
Basophils Absolute: 0.1 10*3/uL (ref 0.0–0.1)
EOS PCT: 4 %
Eosinophils Absolute: 0.5 10*3/uL (ref 0.0–0.5)
HCT: 32.5 % — ABNORMAL LOW (ref 39.0–52.0)
HEMOGLOBIN: 10.1 g/dL — AB (ref 13.0–17.0)
Immature Granulocytes: 1 %
Lymphocytes Relative: 8 %
Lymphs Abs: 1.2 10*3/uL (ref 0.7–4.0)
MCH: 27.9 pg (ref 26.0–34.0)
MCHC: 31.1 g/dL (ref 30.0–36.0)
MCV: 89.8 fL (ref 80.0–100.0)
MONO ABS: 1.1 10*3/uL — AB (ref 0.1–1.0)
MONOS PCT: 8 %
Neutro Abs: 11.9 10*3/uL — ABNORMAL HIGH (ref 1.7–7.7)
Neutrophils Relative %: 79 %
PLATELETS: 441 10*3/uL — AB (ref 150–400)
RBC: 3.62 MIL/uL — AB (ref 4.22–5.81)
RDW: 15.6 % — ABNORMAL HIGH (ref 11.5–15.5)
WBC: 14.9 10*3/uL — AB (ref 4.0–10.5)
nRBC: 0 % (ref 0.0–0.2)

## 2018-09-05 LAB — COMPREHENSIVE METABOLIC PANEL
ALK PHOS: 82 U/L (ref 38–126)
ALT: 13 U/L (ref 0–44)
AST: 12 U/L — ABNORMAL LOW (ref 15–41)
Albumin: 3.2 g/dL — ABNORMAL LOW (ref 3.5–5.0)
Anion gap: 11 (ref 5–15)
BUN: 7 mg/dL — ABNORMAL LOW (ref 8–23)
CHLORIDE: 103 mmol/L (ref 98–111)
CO2: 23 mmol/L (ref 22–32)
Calcium: 9.7 mg/dL (ref 8.9–10.3)
Creatinine, Ser: 0.49 mg/dL — ABNORMAL LOW (ref 0.61–1.24)
GLUCOSE: 118 mg/dL — AB (ref 70–99)
POTASSIUM: 4.4 mmol/L (ref 3.5–5.1)
SODIUM: 137 mmol/L (ref 135–145)
Total Bilirubin: 0.5 mg/dL (ref 0.3–1.2)
Total Protein: 7.7 g/dL (ref 6.5–8.1)

## 2018-09-05 LAB — TSH: TSH: 2.013 u[IU]/mL (ref 0.350–4.500)

## 2018-09-05 MED ORDER — HYDROCODONE-ACETAMINOPHEN 7.5-325 MG PO TABS
1.0000 | ORAL_TABLET | Freq: Once | ORAL | Status: AC
  Administered 2018-09-05: 1 via ORAL
  Filled 2018-09-05: qty 1

## 2018-09-05 MED ORDER — HEPARIN SOD (PORK) LOCK FLUSH 100 UNIT/ML IV SOLN
500.0000 [IU] | Freq: Once | INTRAVENOUS | Status: AC | PRN
  Administered 2018-09-05: 500 [IU]

## 2018-09-05 MED ORDER — SODIUM CHLORIDE 0.9% FLUSH
10.0000 mL | INTRAVENOUS | Status: DC | PRN
  Administered 2018-09-05: 10 mL
  Filled 2018-09-05: qty 10

## 2018-09-05 MED ORDER — SODIUM CHLORIDE 0.9 % IV SOLN
10.0000 mg/kg | Freq: Once | INTRAVENOUS | Status: AC
  Administered 2018-09-05: 620 mg via INTRAVENOUS
  Filled 2018-09-05: qty 2.4

## 2018-09-05 MED ORDER — SODIUM CHLORIDE 0.9 % IV SOLN
Freq: Once | INTRAVENOUS | Status: AC
  Administered 2018-09-05: 12:00:00 via INTRAVENOUS

## 2018-09-05 NOTE — Progress Notes (Signed)
Labs reviewed today with Dr. Delton Coombes, proceed with treatment.  Treatment given per orders. Patient tolerated it well without problems. Vitals stable and discharged home from clinic ambulatory. Follow up as scheduled.

## 2018-09-05 NOTE — Patient Instructions (Signed)
[facility] Discharge Instructions for Patients Receiving Chemotherapy   Beginning January 23rd 2017 lab work for the Cancer Center will be done in the  Main lab at [facility] on 1st floor. If you have a lab appointment with the Cancer Center please come in thru the  Main Entrance and check in at the main information desk   Today you received the following chemotherapy agents   To help prevent nausea and vomiting after your treatment, we encourage you to take your nausea medication     If you develop nausea and vomiting, or diarrhea that is not controlled by your medication, call the clinic.  The clinic phone number is [phone]. Office hours are Monday-Friday 8:30am-5:00pm.  BELOW ARE SYMPTOMS THAT SHOULD BE REPORTED IMMEDIATELY:  *FEVER GREATER THAN 101.0 F  *CHILLS WITH OR WITHOUT FEVER  NAUSEA AND VOMITING THAT IS NOT CONTROLLED WITH YOUR NAUSEA MEDICATION  *UNUSUAL SHORTNESS OF BREATH  *UNUSUAL BRUISING OR BLEEDING  TENDERNESS IN MOUTH AND THROAT WITH OR WITHOUT PRESENCE OF ULCERS  *URINARY PROBLEMS  *BOWEL PROBLEMS  UNUSUAL RASH Items with * indicate a potential emergency and should be followed up as soon as possible. If you have an emergency after office hours please contact your primary care physician or go to the nearest emergency department.  Please call the clinic during office hours if you have any questions or concerns.   You may also contact the Patient Navigator at [phone] should you have any questions or need assistance in obtaining follow up care.      Resources For Cancer Patients and their Caregivers ? American Cancer Society: Can assist with transportation, wigs, general needs, runs Look Good Feel Better.        1-888-227-6333 ? Cancer Care: Provides financial assistance, online support groups, medication/co-pay assistance.  1-800-813-HOPE (4673) ? [facility] Assists [place] cancer  patients and their families through emotional , educational and financial support.  336-427-4357 ? [place] DSS Where to apply for food stamps, Medicaid and utility assistance. 336-342-1394 ? [facility]: Transportation to medical appointments. 336-347-2287 ? Social Security Administration: May apply for disability if have a Stage IV cancer. 336-342-7796 1-800-772-1213 ? [place] Aging, Disability and Transit Services: Assists with nutrition, care and transit needs. 336-349-2343         

## 2018-09-14 ENCOUNTER — Ambulatory Visit (HOSPITAL_COMMUNITY): Payer: Medicare Other | Admitting: Hematology

## 2018-09-14 ENCOUNTER — Ambulatory Visit (HOSPITAL_COMMUNITY): Payer: Medicare Other

## 2018-09-14 ENCOUNTER — Other Ambulatory Visit (HOSPITAL_COMMUNITY): Payer: Medicare Other

## 2018-09-19 ENCOUNTER — Inpatient Hospital Stay (HOSPITAL_COMMUNITY): Payer: Medicare Other

## 2018-09-19 ENCOUNTER — Inpatient Hospital Stay (HOSPITAL_BASED_OUTPATIENT_CLINIC_OR_DEPARTMENT_OTHER): Payer: Medicare Other | Admitting: Hematology

## 2018-09-19 ENCOUNTER — Encounter (HOSPITAL_COMMUNITY): Payer: Self-pay

## 2018-09-19 ENCOUNTER — Encounter (HOSPITAL_COMMUNITY): Payer: Self-pay | Admitting: Hematology

## 2018-09-19 VITALS — BP 102/67 | HR 100 | Temp 98.9°F | Resp 18 | Wt 130.0 lb

## 2018-09-19 VITALS — BP 99/65 | HR 112 | Temp 98.0°F | Resp 18

## 2018-09-19 DIAGNOSIS — C61 Malignant neoplasm of prostate: Secondary | ICD-10-CM | POA: Diagnosis not present

## 2018-09-19 DIAGNOSIS — J329 Chronic sinusitis, unspecified: Secondary | ICD-10-CM

## 2018-09-19 DIAGNOSIS — C3491 Malignant neoplasm of unspecified part of right bronchus or lung: Secondary | ICD-10-CM

## 2018-09-19 DIAGNOSIS — E785 Hyperlipidemia, unspecified: Secondary | ICD-10-CM

## 2018-09-19 DIAGNOSIS — Z7982 Long term (current) use of aspirin: Secondary | ICD-10-CM

## 2018-09-19 DIAGNOSIS — G893 Neoplasm related pain (acute) (chronic): Secondary | ICD-10-CM | POA: Diagnosis not present

## 2018-09-19 DIAGNOSIS — J449 Chronic obstructive pulmonary disease, unspecified: Secondary | ICD-10-CM

## 2018-09-19 DIAGNOSIS — K861 Other chronic pancreatitis: Secondary | ICD-10-CM

## 2018-09-19 DIAGNOSIS — Z9221 Personal history of antineoplastic chemotherapy: Secondary | ICD-10-CM

## 2018-09-19 DIAGNOSIS — I10 Essential (primary) hypertension: Secondary | ICD-10-CM

## 2018-09-19 DIAGNOSIS — I2510 Atherosclerotic heart disease of native coronary artery without angina pectoris: Secondary | ICD-10-CM

## 2018-09-19 DIAGNOSIS — K219 Gastro-esophageal reflux disease without esophagitis: Secondary | ICD-10-CM

## 2018-09-19 DIAGNOSIS — M25511 Pain in right shoulder: Secondary | ICD-10-CM

## 2018-09-19 DIAGNOSIS — F17210 Nicotine dependence, cigarettes, uncomplicated: Secondary | ICD-10-CM

## 2018-09-19 DIAGNOSIS — Z923 Personal history of irradiation: Secondary | ICD-10-CM

## 2018-09-19 DIAGNOSIS — R531 Weakness: Secondary | ICD-10-CM

## 2018-09-19 DIAGNOSIS — I700 Atherosclerosis of aorta: Secondary | ICD-10-CM

## 2018-09-19 DIAGNOSIS — E1151 Type 2 diabetes mellitus with diabetic peripheral angiopathy without gangrene: Secondary | ICD-10-CM

## 2018-09-19 DIAGNOSIS — Z5112 Encounter for antineoplastic immunotherapy: Secondary | ICD-10-CM

## 2018-09-19 DIAGNOSIS — R599 Enlarged lymph nodes, unspecified: Secondary | ICD-10-CM

## 2018-09-19 DIAGNOSIS — Z79899 Other long term (current) drug therapy: Secondary | ICD-10-CM

## 2018-09-19 DIAGNOSIS — Z7984 Long term (current) use of oral hypoglycemic drugs: Secondary | ICD-10-CM

## 2018-09-19 DIAGNOSIS — C3411 Malignant neoplasm of upper lobe, right bronchus or lung: Secondary | ICD-10-CM | POA: Diagnosis not present

## 2018-09-19 LAB — COMPREHENSIVE METABOLIC PANEL
ALT: 16 U/L (ref 0–44)
AST: 17 U/L (ref 15–41)
Albumin: 3.1 g/dL — ABNORMAL LOW (ref 3.5–5.0)
Alkaline Phosphatase: 94 U/L (ref 38–126)
Anion gap: 12 (ref 5–15)
BUN: 9 mg/dL (ref 8–23)
CO2: 21 mmol/L — ABNORMAL LOW (ref 22–32)
Calcium: 10 mg/dL (ref 8.9–10.3)
Chloride: 105 mmol/L (ref 98–111)
Creatinine, Ser: 0.59 mg/dL — ABNORMAL LOW (ref 0.61–1.24)
GFR calc Af Amer: >60 mL/min (ref >60)
GFR calc non Af Amer: >60 mL/min (ref >60)
Glucose, Bld: 128 mg/dL — ABNORMAL HIGH (ref 70–99)
Potassium: 4.6 mmol/L (ref 3.5–5.1)
Sodium: 138 mmol/L (ref 135–145)
Total Bilirubin: 0.4 mg/dL (ref 0.3–1.2)
Total Protein: 7.8 g/dL (ref 6.5–8.1)

## 2018-09-19 LAB — CBC WITH DIFFERENTIAL/PLATELET
Abs Immature Granulocytes: 0.17 10*3/uL — ABNORMAL HIGH (ref 0.00–0.07)
BASOS ABS: 0.1 10*3/uL (ref 0.0–0.1)
Basophils Relative: 1 %
EOS ABS: 0.5 10*3/uL (ref 0.0–0.5)
Eosinophils Relative: 3 %
HEMATOCRIT: 33.7 % — AB (ref 39.0–52.0)
Hemoglobin: 10.1 g/dL — ABNORMAL LOW (ref 13.0–17.0)
IMMATURE GRANULOCYTES: 1 %
Lymphocytes Relative: 8 %
Lymphs Abs: 1.3 10*3/uL (ref 0.7–4.0)
MCH: 27.2 pg (ref 26.0–34.0)
MCHC: 30 g/dL (ref 30.0–36.0)
MCV: 90.8 fL (ref 80.0–100.0)
Monocytes Absolute: 1.3 10*3/uL — ABNORMAL HIGH (ref 0.1–1.0)
Monocytes Relative: 8 %
NEUTROS ABS: 12.8 10*3/uL — AB (ref 1.7–7.7)
NEUTROS PCT: 79 %
NRBC: 0 % (ref 0.0–0.2)
Platelets: 565 10*3/uL — ABNORMAL HIGH (ref 150–400)
RBC: 3.71 MIL/uL — ABNORMAL LOW (ref 4.22–5.81)
RDW: 15.4 % (ref 11.5–15.5)
WBC: 16.2 10*3/uL — ABNORMAL HIGH (ref 4.0–10.5)

## 2018-09-19 MED ORDER — OXYCODONE-ACETAMINOPHEN 5-325 MG PO TABS
2.0000 | ORAL_TABLET | Freq: Once | ORAL | Status: AC
  Administered 2018-09-19: 2 via ORAL

## 2018-09-19 MED ORDER — OXYCODONE-ACETAMINOPHEN 5-325 MG PO TABS
ORAL_TABLET | ORAL | Status: AC
  Filled 2018-09-19: qty 2

## 2018-09-19 MED ORDER — OXYCODONE-ACETAMINOPHEN 10-325 MG PO TABS: 1.0000 | ORAL_TABLET | Freq: Four times a day (QID) | ORAL | 0 refills | Status: DC | PRN

## 2018-09-19 MED ORDER — SODIUM CHLORIDE 0.9 % IV SOLN
Freq: Once | INTRAVENOUS | Status: AC
  Administered 2018-09-19: 12:00:00 via INTRAVENOUS

## 2018-09-19 MED ORDER — SODIUM CHLORIDE 0.9 % IV SOLN
10.0000 mg/kg | Freq: Once | INTRAVENOUS | Status: AC
  Administered 2018-09-19: 620 mg via INTRAVENOUS
  Filled 2018-09-19: qty 2.4

## 2018-09-19 MED ORDER — HEPARIN SOD (PORK) LOCK FLUSH 100 UNIT/ML IV SOLN
500.0000 [IU] | Freq: Once | INTRAVENOUS | Status: AC | PRN
  Administered 2018-09-19: 500 [IU]

## 2018-09-19 NOTE — Assessment & Plan Note (Addendum)
1.  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 -Patient evaluated by Dr. Roxan Hockey and was recommended to have preoperative chemoradiation therapy. -Chemoradiation therapy with carboplatin and paclitaxel from 02/20/2018 through 04/04/2018. -I have reviewed the results of the CT scan of the chest dated 04/27/2018 which showed regression of the tumor which is sub-solid in appearance.  No new areas were seen. - He was started on consolidation Durvalumab on 05/03/2018.  He is tolerating it very well. -We discussed the results of the CT scan of the chest with contrast dated 07/31/2018 which shows slight interval worsening of destruction of the posterolateral right second and third ribs.  There was also mention of slight worsening by few millimeters of the right apical lung mass. - I have discussed the findings on the PET CT scan dated 08/30/2018 showing peripheral right apical tumor with rib and chest wall invasion, progressive compared to prior PET scan, with medial competence and increased chest wall invasion.  SUV max is similar.  Right paratracheal/mediastinal lymph node demonstrates increased hypermetabolism. - I think he has progression of his chest wall lesion making his pain worse.  I will contact with Dr.Yanagihara to see if any further radiation can be given.  We will have to consider change in his treatment. -For his intractable pain, I will change his pain medication to Percocet 10 mg to be taken every 6 hours as needed.  He was taking hydrocodone 7.5 mg 2 tablets/day and Goody powder at nighttime. -I will see him back in 2 weeks for follow-up.  2.  Weight loss: His weight has been stable since last visit.  3.  Recurrent prostate cancer: He had a history of prostate cancer and reportedly received radiation therapy 6-7 years ago.  He had prostate biopsies on 01/17/2018 which showed  recurrence with a Gleason score (5+5)=10.  He will be treated with GnRH agonist.  4.  Cancer related pain: -He had a recent worsening of right-sided back pain.  Hydrocodone 7.5 mg twice daily is not helping. -he was given prescription for Percocet 10 mg every 6 hours as needed.

## 2018-09-19 NOTE — Progress Notes (Signed)
Patient seen by dr. Delton Coombes with lab review.  Ok to treat today verbal order Dr. Delton Coombes.    Patient tolerated treatment with no complaints voiced.  Rated shoulder pain a mild discomfort.  No s/s of distress noted.  Port site clean and dry with no bruising or swelling noted at site.  VSS with discharge and left ambulatory with no s/s of distress noted.

## 2018-09-19 NOTE — Patient Instructions (Signed)
[facility] Discharge Instructions for Patients Receiving Chemotherapy  Today you received the following chemotherapy agents imfinzi.    If you develop nausea and vomiting that is not controlled by your nausea medication, call the clinic.   BELOW ARE SYMPTOMS THAT SHOULD BE REPORTED IMMEDIATELY:  *FEVER GREATER THAN 100.5 F  *CHILLS WITH OR WITHOUT FEVER  NAUSEA AND VOMITING THAT IS NOT CONTROLLED WITH YOUR NAUSEA MEDICATION  *UNUSUAL SHORTNESS OF BREATH  *UNUSUAL BRUISING OR BLEEDING  TENDERNESS IN MOUTH AND THROAT WITH OR WITHOUT PRESENCE OF ULCERS  *URINARY PROBLEMS  *BOWEL PROBLEMS  UNUSUAL RASH Items with * indicate a potential emergency and should be followed up as soon as possible.  Feel free to call the clinic should you have any questions or concerns. The clinic phone number is [phone].  Please show the [facility] at check-in to the Emergency Department and triage nurse.

## 2018-09-19 NOTE — Patient Instructions (Signed)
[place] at [facility] Discharge Instructions  Follow up in 2 weeks with labs    Thank you for choosing [place] at [facility] to provide your oncology and hematology care.  To afford each patient quality time with our provider, please arrive at least 15 minutes before your scheduled appointment time.   If you have a lab appointment with the [facility] please come in thru the  Main Entrance and check in at the main information desk  You need to re-schedule your appointment should you arrive 10 or more minutes late.  We strive to give you quality time with our providers, and arriving late affects you and other patients whose appointments are after yours.  Also, if you no show three or more times for appointments you may be dismissed from the clinic at the providers discretion.     Again, thank you for choosing [facility].  Our hope is that these requests will decrease the amount of time that you wait before being seen by our physicians.       _____________________________________________________________  Should you have questions after your visit to [facility], please contact our office at [phone] between the hours of 8:00 a.m. and 4:30 p.m.  Voicemails left after 4:00 p.m. will not be returned until the following business day.  For prescription refill requests, have your pharmacy contact our office and allow 72 hours.    Cancer Center Support Programs:   > Cancer Support Group  2nd Tuesday of the month 1pm-2pm, Journey Room

## 2018-09-19 NOTE — Progress Notes (Signed)
[place], [place] [address]   CLINIC:  Medical Oncology/Hematology  PCP:  Fred Schwalbe, MD [address] [place] [address] [phone]   REASON FOR VISIT: Follow-up for squamous cell carcinoma of the right lung cancer  CURRENT THERAPY: Imfinzi   BRIEF ONCOLOGIC HISTORY:    Squamous cell carcinoma of right lung ([place])   12/13/2017 PET scan    IMPRESSION: 1. 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 There is early bony invasion of the right second rib and cortical destruction indicating local rib/chest wall invasion by tumor. 2. There is additional pleural thickening and pleural calcification along the right posterior pleural surface which is not separately hypermetabolic. 3. Small mediastinal lymph nodes are not appreciably hypermetabolic. 4. Multifocal and some diffuse regions of high activity in the bowel are likely physiologic. 5. No findings of metastatic disease to the neck, abdomen/pelvis, or to the included remainder of the skeleton. 6. Other imaging findings of potential clinical significance: Chronic right maxillary sinusitis. Aortic Atherosclerosis (ICD10-I70.0) and Emphysema (ICD10-J43.9). Chronic calcific pancreatitis.     01/15/2018 Initial Diagnosis    Squamous cell carcinoma of right lung ([place])    01/15/2018 Initial Biopsy    Lung needle core biopsy RUL: Squamous cell carcinoma    02/19/2018 Procedure    Port-a-cath placement by Dr. Arnoldo Morale     02/20/2018 -  Chemotherapy    The patient had palonosetron (ALOXI) injection 0.25 mg, 0.25 mg, Intravenous,  Once, 1 of 4 cycles Administration: 0.25 mg (02/20/2018) CARBOplatin (PARAPLATIN) 230 mg in sodium chloride 0.9 % 250 mL chemo infusion, 230 mg (100 % of original dose 225.8 mg), Intravenous,  Once, 1 of 4 cycles Dose modification:   (original dose 225.8 mg, Cycle 1) PACLitaxel  (TAXOL) 78 mg in dextrose 5 % 250 mL chemo infusion (</= 80mg /m2), 45 mg/m2 = 78 mg, Intravenous,  Once, 1 of 4 cycles Administration: 78 mg (02/20/2018)  for chemotherapy treatment.     05/06/2018 -  Chemotherapy    The patient had durvalumab (IMFINZI) 620 mg in sodium chloride 0.9 % 100 mL chemo infusion, 10 mg/kg = 620 mg, Intravenous,  Once, 10 of 12 cycles Administration: 620 mg (05/11/2018), 620 mg (05/25/2018), 620 mg (06/08/2018), 620 mg (06/22/2018), 620 mg (07/06/2018), 620 mg (07/20/2018), 620 mg (08/08/2018), 620 mg (08/22/2018), 620 mg (09/05/2018), 620 mg (09/19/2018)  for chemotherapy treatment.      Adenocarcinoma of prostate ([place])   10/24/2015 Tumor Marker    PSA 1.53    01/06/2016 Tumor Marker    PSA 1.6    07/31/2017 Tumor Marker    PSA 4.6    01/17/2018 Initial Diagnosis    Adenocarcinoma of prostate ([place])    01/17/2018 Procedure    12-core prostate biopsy revealed 6/12 cores positive for prostate adenocarcinoma (Dr. [facility]).  (L) base lat: Gleason 9 (5+4), 20%. (L) mid lat: Gleason 9 (4+5), 40%. (L) apex lat: Gleason 9 (4+5), 20%. (L) base: Gleason 9 (5+4), 20%. (L) mid: Gleason 10 (5+5), 5%. (L) apex: Gleason 9 (5+4), 9%.         INTERVAL HISTORY:  Fred Hernandez 62 y.o. male returns for routine follow-up for squamous cell lung cancer. Patient his here today with his wife. He is having increasing pain in his right upper back and shoulder. He is unable to control it with he hydrocodone that was prescribed. He is having trouble doing  daily activities and sleeping at night due to the pain. He also experiences fatigue and weakness throughout the day. He denies any fevers or recent infections. Denies any nausea, vomiting, or diarrhea. Denies any skin rashes or mouth sores. Denies any new cough. He reports his appetite at 25% and he has no energy.     REVIEW OF SYSTEMS:  Review of Systems  Constitutional: Positive for fatigue.  Musculoskeletal: Positive for back  pain.  All other systems reviewed and are negative.    PAST MEDICAL/SURGICAL HISTORY:  Past Medical History:  Diagnosis Date  . Bursitis   . CAD (coronary artery disease)     s/p stent placement unknown artery 12/2014,  . Cancer [facility])    prostate cancer  . COPD (chronic obstructive pulmonary disease) ([place])   . Diabetes mellitus ([place])   . GERD (gastroesophageal reflux disease)   . History of prostate cancer   . Hyperlipemia   . Hypertension   . Peripheral arterial disease ([place])    a. h/o stents. b. 03/2016  - successful PTA and covered stenting using overlapping Viabahn covered stents of a long segment in-stent restenosis of previously placed nitinol self expanding stents in the proximal mid and distal right SFA.  . Tobacco abuse    Past Surgical History:  Procedure Laterality Date  . COLONOSCOPY WITH PROPOFOL N/A 11/22/2016   Procedure: COLONOSCOPY WITH PROPOFOL;  Surgeon: [name], MD;  Location: [facility] ENDO SUITE;  Service: Endoscopy;  Laterality: N/A;  10:00 am  . CORONARY STENT PLACEMENT  12-2014   [facility] [employer]  . FEMORAL ARTERY STENT Bilateral 03/2013  . PERIPHERAL VASCULAR CATHETERIZATION N/A 04/04/2016   Procedure: Lower Extremity Angiography;  Surgeon: Lorretta Harp, MD;  Location: [facility] CV LAB;  Service: Cardiovascular;  Laterality: N/A;  . PERIPHERAL VASCULAR CATHETERIZATION N/A 04/04/2016   Procedure: Abdominal Aortogram;  Surgeon: Lorretta Harp, MD;  Location: [facility] CV LAB;  Service: Cardiovascular;  Laterality: N/A;  . PERIPHERAL VASCULAR CATHETERIZATION  04/04/2016   Procedure: Peripheral Vascular Intervention;  Surgeon: Lorretta Harp, MD;  Location: [facility] CV LAB;  Service: Cardiovascular;;  rt SFA stent  . POLYPECTOMY  11/22/2016   Procedure: POLYPECTOMY;  Surgeon: [name], MD;  Location: [facility];  Service: Endoscopy;;  descending colon polyp, transverse colon polyp, sigmoid colon polyp, rectal polyp  .  PORTACATH PLACEMENT Left 02/19/2018   Procedure: INSERTION PORT-A-CATH;  Surgeon: Aviva Signs, MD;  Location: [facility] ORS;  Service: General;  Laterality: Left;  . STENT PLACEMENT VASCULAR ([place] [facility])  2013     SOCIAL HISTORY:  Social History   Socioeconomic History  . Marital status: Single    Spouse name: Not on file  . Number of children: Not on file  . Years of education: Not on file  . Highest education level: Not on file  Occupational History  . Not on file  Social Needs  . Financial resource strain: Not on file  . Food insecurity:    Worry: Not on file    Inability: Not on file  . Transportation needs:    Medical: Not on file    Non-medical: Not on file  Tobacco Use  . Smoking status: Current Every Day Smoker    Packs/day: 0.25    Years: 45.00    Pack years: 11.25    Types: Cigarettes    Start date: 02/12/1971  . Smokeless tobacco: Never Used  Substance and Sexual Activity  . Alcohol use:  Not Currently    Alcohol/week: 0.0 standard drinks    Comment: stopped 2 weeks ago.  . Drug use: No  . Sexual activity: Yes  Lifestyle  . Physical activity:    Days per week: Not on file    Minutes per session: Not on file  . Stress: Not on file  Relationships  . Social connections:    Talks on phone: Not on file    Gets together: Not on file    Attends religious service: Not on file    Active member of club or organization: Not on file    Attends meetings of clubs or organizations: Not on file    Relationship status: Not on file  . Intimate partner violence:    Fear of current or ex partner: Not on file    Emotionally abused: Not on file    Physically abused: Not on file    Forced sexual activity: Not on file  Other Topics Concern  . Not on file  Social History Narrative  . Not on file    FAMILY HISTORY:  Family History  Problem Relation Age of Onset  . Diabetes Mother   . Heart disease Mother   . Hypertension Mother   . Healthy Brother   . Other Brother         accident   . Arthritis Sister   . Arthritis Sister   . SIDS Brother   . Colon cancer Neg Hx     CURRENT MEDICATIONS:  Outpatient Encounter Medications as of 09/19/2018  Medication Sig Note  . aspirin 81 MG chewable tablet Chew 81 mg by mouth daily. 02/14/2018: Last dose 02/13/2018. On hold for surgery.  [name] atorvastatin (LIPITOR) 80 MG tablet Take 80 mg by mouth at bedtime.    [name] CARBOPLATIN IV Inject into the vein. 6 weekly treatments with radiation   . cilostazol (PLETAL) 50 MG tablet TAKE 1 TABLET(50 MG) BY MOUTH TWICE DAILY   . Coenzyme Q10 100 MG capsule Take 100 mg by mouth at bedtime.    . Diphenhyd-Hydrocort-Nystatin (FIRST-DUKES MOUTHWASH MT) Take by mouth.   . dronabinol (MARINOL) 5 MG capsule Take 1 capsule (5 mg total) by mouth 2 (two) times daily before a meal.   . FLOVENT HFA 110 MCG/ACT inhaler Inhale 1 puff into the lungs 2 (two) times daily.   . fluticasone (FLONASE) 50 MCG/ACT nasal spray Place 1 spray into both nostrils daily.   [name] HYDROcodone-acetaminophen (NORCO) 7.5-325 MG tablet One tablet as needed twice a day for pain.   . hydrOXYzine (ATARAX/VISTARIL) 50 MG tablet Take by mouth.   [name] ipratropium-albuterol (DUONEB) 0.5-2.5 (3) MG/3ML SOLN Take 3 mLs by nebulization every 6 (six) hours as needed (for shortness of breath).   . lidocaine-prilocaine (EMLA) cream Apply a quarter size amount to affected area 1 hour prior to coming to chemotherapy.  Do not rub in.  Cover with plastic wrap.   . megestrol (MEGACE) 20 MG tablet Take by mouth.   . metFORMIN (GLUCOPHAGE) 500 MG tablet Take 500 mg by mouth 2 (two) times daily.    . methocarbamol (ROBAXIN) 500 MG tablet    . metoprolol succinate (TOPROL-XL) 50 MG 24 hr tablet Take 50 mg by mouth at bedtime.    [name] nystatin (MYCOSTATIN) 100000 UNIT/ML suspension    . Omega-3 Fatty Acids (FISH OIL) 1000 MG CAPS Take 1 capsule by mouth 2 (two) times daily.    . ondansetron (ZOFRAN) 8 MG tablet Take 1 tablet (8 mg  total) by mouth every 8  (eight) hours as needed for nausea or vomiting.   [name] oxyCODONE-acetaminophen (PERCOCET) 10-325 MG tablet Take 1 tablet by mouth every 6 (six) hours as needed for pain.   [name] PACLitaxel (TAXOL IV) Inject into the vein. 6 weekly treatments with radiation   . pantoprazole (PROTONIX) 40 MG tablet Take 40 mg by mouth 2 (two) times daily.   . prochlorperazine (COMPAZINE) 10 MG tablet Take 1 tablet (10 mg total) by mouth every 6 (six) hours as needed for nausea or vomiting.   . triamcinolone cream (KENALOG) 0.1 % Apply topically 2 (two) times daily.    No facility-administered encounter medications on file as of 09/19/2018.     ALLERGIES:  Allergies  Allergen Reactions  . Lisinopril Anaphylaxis  . Penicillins Anaphylaxis    Has patient had a PCN reaction causing immediate rash, facial/tongue/throat swelling, SOB or lightheadedness with hypotension:Yes Has patient had a PCN reaction causing severe rash involving mucus membranes or skin necrosis:unsure Has patient had a PCN reaction that required hospitalization:unsure Has patient had a PCN reaction occurring within the last 10 years:~10 years per patient If all of the above answers are "NO", then may proceed with Cephalosporin use.   . Ace Inhibitors Swelling     PHYSICAL EXAM:  ECOG Performance status: 1  Vitals:   09/19/18 1025  BP: 102/67  Pulse: 100  Resp: 18  Temp: 98.9 F (37.2 C)  SpO2: 100%   Filed Weights   09/19/18 1025  Weight: 130 lb (59 kg)    Physical Exam  Constitutional: He is oriented to person, place, and time. He appears well-developed and well-nourished.  Cardiovascular: Normal rate, regular rhythm and normal heart sounds.  Pulmonary/Chest: Effort normal and breath sounds normal.  Musculoskeletal: Normal range of motion.  Neurological: He is alert and oriented to person, place, and time.  Skin: Skin is warm and dry.  Psychiatric: He has a normal mood and affect. His behavior is normal. Judgment and thought  content normal.     LABORATORY DATA:  I have reviewed the labs as listed.  CBC    Component Value Date/Time   WBC 16.2 (H) 09/19/2018 0934   RBC 3.71 (L) 09/19/2018 0934   HGB 10.1 (L) 09/19/2018 0934   HCT 33.7 (L) 09/19/2018 0934   PLT 565 (H) 09/19/2018 0934   MCV 90.8 09/19/2018 0934   MCH 27.2 09/19/2018 0934   MCHC 30.0 09/19/2018 0934   RDW 15.4 09/19/2018 0934   LYMPHSABS 1.3 09/19/2018 0934   MONOABS 1.3 (H) 09/19/2018 0934   EOSABS 0.5 09/19/2018 0934   BASOSABS 0.1 09/19/2018 0934   CMP Latest Ref Rng & Units 09/19/2018 09/05/2018 08/22/2018  Glucose 70 - 99 mg/dL 128(H) 118(H) 139(H)  BUN 8 - 23 mg/dL 9 7(L) 14  Creatinine 0.61 - 1.24 mg/dL 0.59(L) 0.49(L) 0.54(L)  Sodium 135 - 145 mmol/L 138 137 135  Potassium 3.5 - 5.1 mmol/L 4.6 4.4 4.6  Chloride 98 - 111 mmol/L 105 103 102  CO2 22 - 32 mmol/L 21(L) 23 23  Calcium 8.9 - 10.3 mg/dL 10.0 9.7 10.1  Total Protein 6.5 - 8.1 g/dL 7.8 7.7 7.5  Total Bilirubin 0.3 - 1.2 mg/dL 0.4 0.5 0.2(L)  Alkaline Phos 38 - 126 U/L 94 82 79  AST 15 - 41 U/L 17 12(L) 14(L)  ALT 0 - 44 U/L 16 13 18        DIAGNOSTIC IMAGING:  I have reviewed PET scan images dated 08/30/2018  and discussed with the patient.     ASSESSMENT & PLAN:   Squamous cell carcinoma of right lung ([place]) 1.  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 -Patient evaluated by Dr. Roxan Hockey and was recommended to have preoperative chemoradiation therapy. -Chemoradiation therapy with carboplatin and paclitaxel from 02/20/2018 through 04/04/2018. -I have reviewed the results of the CT scan of the chest dated 04/27/2018 which showed regression of the tumor which is sub-solid in appearance.  No new areas were seen. - He was started on consolidation Durvalumab on 05/03/2018.  He is tolerating it very well. -We discussed the results of the CT  scan of the chest with contrast dated 07/31/2018 which shows slight interval worsening of destruction of the posterolateral right second and third ribs.  There was also mention of slight worsening by few millimeters of the right apical lung mass. - I have discussed the findings on the PET CT scan dated 08/30/2018 showing peripheral right apical tumor with rib and chest wall invasion, progressive compared to prior PET scan, with medial competence and increased chest wall invasion.  SUV max is similar.  Right paratracheal/mediastinal lymph node demonstrates increased hypermetabolism. - I think he has progression of his chest wall lesion making his pain worse.  I will contact with Dr.Yanagihara to see if any further radiation can be given.  We will have to consider change in his treatment. -For his intractable pain, I will change his pain medication to Percocet 10 mg to be taken every 6 hours as needed.  He was taking hydrocodone 7.5 mg 2 tablets/day and Goody powder at nighttime. -I will see him back in 2 weeks for follow-up.  2.  Weight loss: His weight has been stable since last visit.  3.  Recurrent prostate cancer: He had a history of prostate cancer and reportedly received radiation therapy 6-7 years ago.  He had prostate biopsies on 01/17/2018 which showed recurrence with a Gleason score (5+5)=10.  He will be treated with GnRH agonist.  4.  Cancer related pain: -He had a recent worsening of right-sided back pain.  Hydrocodone 7.5 mg twice daily is not helping. -he was given prescription for Percocet 10 mg every 6 hours as needed.      Orders placed this encounter:  Orders Placed This Encounter  Procedures  . CBC with Differential/Platelet  . Comprehensive metabolic panel  . TSH      Derek Jack, [place] [phone]

## 2018-09-20 ENCOUNTER — Ambulatory Visit: Payer: Medicare Other | Admitting: Orthopaedic Surgery

## 2018-09-21 ENCOUNTER — Telehealth (HOSPITAL_COMMUNITY): Payer: Self-pay

## 2018-09-21 NOTE — Telephone Encounter (Signed)
Nutrition Follow-up:  Patient with lung cancer and receiving durvalumab.  Noted per MD note disease progression on chest wall lesion making pain worse.  Noted pain medications were adjusted.  Called for nutrition follow-up and spoke with wife.  Wife reports he does not have an appetite.  Ate 1/2 of bologna sandwich yesterday and drank ensure.  Reports they do not have the money to buy ensure.  Reports nausea once in awhile and constipation some.  Biggest compliant is pain effecting appetite.    Medications: reviewed, has marinol but does not like how it makes him feel per wife  Labs: reviewed  Anthropometrics:   Weight 130 lb on 11/20 decreased from 136 lb 3.2 oz on 10/9   NUTRITION DIAGNOSIS: severe malnutrition continues   MALNUTRITION DIAGNOSIS: severe malnutrition continues   INTERVENTION:  Hopeful new pain medication will help with better pain control Encouraged high calorie, high protein foods Will provide additional case of ensure enlive for wife to pick up on Monday, Nov 25 at front desk    MONITORING, EVALUATION, GOAL: weight trends, po intake   NEXT VISIT: phone f/u Dec 20  Annelie Boak B. Zenia Resides, [place], [place] Registered Dietitian [phone] (pager)

## 2018-09-25 ENCOUNTER — Ambulatory Visit: Payer: Medicare Other | Admitting: Orthopaedic Surgery

## 2018-10-01 ENCOUNTER — Other Ambulatory Visit (HOSPITAL_COMMUNITY): Payer: Self-pay | Admitting: Hematology

## 2018-10-01 ENCOUNTER — Telehealth (HOSPITAL_COMMUNITY): Payer: Self-pay | Admitting: *Deleted

## 2018-10-01 DIAGNOSIS — C3491 Malignant neoplasm of unspecified part of right bronchus or lung: Secondary | ICD-10-CM

## 2018-10-01 MED ORDER — HYDROMORPHONE HCL 2 MG PO TABS: 2.0000 mg | ORAL_TABLET | ORAL | 0 refills | Status: DC | PRN

## 2018-10-01 NOTE — Telephone Encounter (Signed)
Pt's significant other aware that Dilaudid has been sent into the pharmacy for the pt. She was also informed that the pt should only take this pain medication and not the hydrocodone or oxycodone. She verbalized understanding.

## 2018-10-02 ENCOUNTER — Other Ambulatory Visit (HOSPITAL_COMMUNITY): Payer: Self-pay | Admitting: Nurse Practitioner

## 2018-10-04 ENCOUNTER — Inpatient Hospital Stay (HOSPITAL_COMMUNITY): Payer: Medicare Other | Attending: Hematology

## 2018-10-04 ENCOUNTER — Encounter (HOSPITAL_COMMUNITY): Payer: Self-pay | Admitting: Hematology

## 2018-10-04 ENCOUNTER — Inpatient Hospital Stay (HOSPITAL_COMMUNITY): Payer: Medicare Other

## 2018-10-04 ENCOUNTER — Other Ambulatory Visit: Payer: Self-pay

## 2018-10-04 ENCOUNTER — Inpatient Hospital Stay (HOSPITAL_BASED_OUTPATIENT_CLINIC_OR_DEPARTMENT_OTHER): Payer: Medicare Other | Admitting: Hematology

## 2018-10-04 DIAGNOSIS — F17210 Nicotine dependence, cigarettes, uncomplicated: Secondary | ICD-10-CM | POA: Diagnosis not present

## 2018-10-04 DIAGNOSIS — G893 Neoplasm related pain (acute) (chronic): Secondary | ICD-10-CM | POA: Diagnosis not present

## 2018-10-04 DIAGNOSIS — Z5111 Encounter for antineoplastic chemotherapy: Secondary | ICD-10-CM | POA: Insufficient documentation

## 2018-10-04 DIAGNOSIS — R634 Abnormal weight loss: Secondary | ICD-10-CM | POA: Insufficient documentation

## 2018-10-04 DIAGNOSIS — C3491 Malignant neoplasm of unspecified part of right bronchus or lung: Secondary | ICD-10-CM

## 2018-10-04 DIAGNOSIS — C61 Malignant neoplasm of prostate: Secondary | ICD-10-CM

## 2018-10-04 DIAGNOSIS — C3411 Malignant neoplasm of upper lobe, right bronchus or lung: Secondary | ICD-10-CM | POA: Insufficient documentation

## 2018-10-04 DIAGNOSIS — Z9221 Personal history of antineoplastic chemotherapy: Secondary | ICD-10-CM

## 2018-10-04 DIAGNOSIS — Z7189 Other specified counseling: Secondary | ICD-10-CM

## 2018-10-04 DIAGNOSIS — Z923 Personal history of irradiation: Secondary | ICD-10-CM

## 2018-10-04 LAB — CBC WITH DIFFERENTIAL/PLATELET
Abs Immature Granulocytes: 0.11 10*3/uL — ABNORMAL HIGH (ref 0.00–0.07)
BASOS PCT: 1 %
Basophils Absolute: 0.1 10*3/uL (ref 0.0–0.1)
EOS PCT: 2 %
Eosinophils Absolute: 0.3 10*3/uL (ref 0.0–0.5)
HCT: 33.9 % — ABNORMAL LOW (ref 39.0–52.0)
Hemoglobin: 10.6 g/dL — ABNORMAL LOW (ref 13.0–17.0)
Immature Granulocytes: 1 %
Lymphocytes Relative: 7 %
Lymphs Abs: 1.1 10*3/uL (ref 0.7–4.0)
MCH: 27 pg (ref 26.0–34.0)
MCHC: 31.3 g/dL (ref 30.0–36.0)
MCV: 86.5 fL (ref 80.0–100.0)
MONO ABS: 1.4 10*3/uL — AB (ref 0.1–1.0)
Monocytes Relative: 8 %
Neutro Abs: 13.1 10*3/uL — ABNORMAL HIGH (ref 1.7–7.7)
Neutrophils Relative %: 81 %
PLATELETS: 708 10*3/uL — AB (ref 150–400)
RBC: 3.92 MIL/uL — AB (ref 4.22–5.81)
RDW: 14.9 % (ref 11.5–15.5)
WBC: 16 10*3/uL — ABNORMAL HIGH (ref 4.0–10.5)
nRBC: 0 % (ref 0.0–0.2)

## 2018-10-04 LAB — COMPREHENSIVE METABOLIC PANEL
ALT: 13 U/L (ref 0–44)
ANION GAP: 12 (ref 5–15)
AST: 15 U/L (ref 15–41)
Albumin: 3.2 g/dL — ABNORMAL LOW (ref 3.5–5.0)
Alkaline Phosphatase: 80 U/L (ref 38–126)
BUN: 11 mg/dL (ref 8–23)
CHLORIDE: 97 mmol/L — AB (ref 98–111)
CO2: 24 mmol/L (ref 22–32)
Calcium: 10.7 mg/dL — ABNORMAL HIGH (ref 8.9–10.3)
Creatinine, Ser: 0.61 mg/dL (ref 0.61–1.24)
GFR calc Af Amer: >60 mL/min (ref >60)
GFR calc non Af Amer: >60 mL/min (ref >60)
Glucose, Bld: 134 mg/dL — ABNORMAL HIGH (ref 70–99)
POTASSIUM: 3.4 mmol/L — AB (ref 3.5–5.1)
SODIUM: 133 mmol/L — AB (ref 135–145)
Total Bilirubin: 0.2 mg/dL — ABNORMAL LOW (ref 0.3–1.2)
Total Protein: 8.1 g/dL (ref 6.5–8.1)

## 2018-10-04 LAB — TSH: TSH: 2.367 u[IU]/mL (ref 0.350–4.500)

## 2018-10-04 NOTE — Patient Instructions (Signed)
[facility] at [facility] Discharge Instructions     Thank you for choosing [facility] at [facility] to provide your oncology and hematology care.  To afford each patient quality time with our provider, please arrive at least 15 minutes before your scheduled appointment time.   If you have a lab appointment with the Cancer Center please come in thru the  Main Entrance and check in at the main information desk  You need to re-schedule your appointment should you arrive 10 or more minutes late.  We strive to give you quality time with our providers, and arriving late affects you and other patients whose appointments are after yours.  Also, if you no show three or more times for appointments you may be dismissed from the clinic at the providers discretion.     Again, thank you for choosing [facility].  Our hope is that these requests will decrease the amount of time that you wait before being seen by our physicians.       _____________________________________________________________  Should you have questions after your visit to [facility], please contact our office at [phone] between the hours of 8:00 a.m. and 4:30 p.m.  Voicemails left after 4:00 p.m. will not be returned until the following business day.  For prescription refill requests, have your pharmacy contact our office and allow 72 hours.    Cancer Center Support Programs:   > Cancer Support Group  2nd Tuesday of the month 1pm-2pm, Journey Room    

## 2018-10-04 NOTE — Progress Notes (Signed)
DISCONTINUE ON PATHWAY REGIMEN - Non-Small Cell Lung     A cycle is every 14 days:     Durvalumab   **Always confirm dose/schedule in your pharmacy ordering system**  REASON: Disease Progression PRIOR TREATMENT: [id number]: Durvalumab 10 mg/kg q14 Days x up to 12 Months TREATMENT RESPONSE: Progressive Disease (PD)  START OFF PATHWAY REGIMEN - Non-Small Cell Lung   [id number]:Carboplatin + Gemcitabine (02/999) q21 Days:   A cycle is every 21 days:     Carboplatin      Gemcitabine   **Always confirm dose/schedule in your pharmacy ordering system**  Patient Characteristics: Stage III - Unresectable, PS = 0, 1 AJCC T Category: T4 Current Disease Status: No Distant Mets or Local Recurrence AJCC N Category: N0 AJCC M Category: M0 AJCC 8 Stage Grouping: IIIA Performance Status: PS = 0, 1 Intent of Therapy: Non-Curative / Palliative Intent, Discussed with Patient

## 2018-10-04 NOTE — Progress Notes (Signed)
No treatment today per MD.  

## 2018-10-04 NOTE — Progress Notes (Signed)
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 There is early bony invasion of the right second rib and cortical destruction indicating local rib/chest wall invasion by tumor. 2. There is additional pleural thickening and pleural calcification along the right posterior pleural surface which is not separately hypermetabolic. 3. Small mediastinal lymph nodes are not appreciably hypermetabolic. 4. Multifocal and some diffuse regions of high activity in the bowel are likely physiologic. 5. No findings of metastatic disease to the neck, abdomen/pelvis, or to the included remainder of the skeleton. 6. Other imaging findings of potential clinical significance: Chronic right maxillary sinusitis. Aortic Atherosclerosis (ICD10-I70.0) and Emphysema (ICD10-J43.9). Chronic calcific pancreatitis.     01/15/2018 Initial Diagnosis    Squamous cell carcinoma of right lung ([place])    01/15/2018 Initial Biopsy    Lung needle core biopsy RUL: Squamous cell carcinoma    02/19/2018 Procedure    Port-a-cath placement by Dr. Arnoldo Morale     02/20/2018 -  Chemotherapy    The patient had palonosetron (ALOXI) injection 0.25 mg, 0.25 mg, Intravenous,  Once, 1 of 4 cycles Administration: 0.25 mg (02/20/2018) CARBOplatin (PARAPLATIN) 230 mg in sodium chloride 0.9 % 250 mL chemo infusion, 230 mg (100 % of original dose 225.8 mg), Intravenous,  Once, 1 of 4 cycles Dose  modification:   (original dose 225.8 mg, Cycle 1) PACLitaxel (TAXOL) 78 mg in dextrose 5 % 250 mL chemo infusion (</= 80mg /m2), 45 mg/m2 = 78 mg, Intravenous,  Once, 1 of 4 cycles Administration: 78 mg (02/20/2018)  for chemotherapy treatment.     05/06/2018 - 10/03/2018 Chemotherapy    The patient had durvalumab (IMFINZI) 620 mg in sodium chloride 0.9 % 100 mL chemo infusion, 10 mg/kg = 620 mg, Intravenous,  Once, 10 of 12 cycles Administration: 620 mg (05/11/2018), 620 mg (05/25/2018), 620 mg (06/08/2018), 620 mg (06/22/2018), 620 mg (07/06/2018), 620 mg (07/20/2018), 620 mg (08/08/2018), 620 mg (08/22/2018), 620 mg (09/05/2018), 620 mg (09/19/2018)  for chemotherapy treatment.     10/07/2018 -  Chemotherapy    The patient had palonosetron (ALOXI) injection 0.25 mg, 0.25 mg, Intravenous,  Once, 0 of 4 cycles ondansetron (ZOFRAN) 8 mg in sodium chloride 0.9 % 50 mL IVPB, 8 mg (original dose ), Intravenous,  Once, 0 of 4 cycles Dose modification: 8 mg (Cycle 1) CARBOplatin (PARAPLATIN) in sodium chloride 0.9 % 100 mL chemo infusion, , Intravenous,  Once, 0 of 4 cycles gemcitabine (GEMZAR) 1,634 mg in sodium chloride 0.9 % 100 mL chemo infusion, 1,000 mg/m2, Intravenous,  Once, 0 of 4 cycles  for chemotherapy treatment.      Adenocarcinoma of prostate ([place])   10/24/2015 Tumor Marker    PSA 1.53    01/06/2016 Tumor Marker    PSA 1.6    07/31/2017 Tumor Marker    PSA 4.6    01/17/2018 Initial Diagnosis    Adenocarcinoma of prostate ([place])    01/17/2018 Procedure  12-core prostate biopsy revealed 6/12 cores positive for prostate adenocarcinoma (Dr. [facility]).  (L) base lat: Gleason 9 (5+4), 20%. (L) mid lat: Gleason 9 (4+5), 40%. (L) apex lat: Gleason 9 (4+5), 20%. (L) base: Gleason 9 (5+4), 20%. (L) mid: Gleason 10 (5+5), 5%. (L) apex: Gleason 9 (5+4), 9%.       INTERVAL HISTORY:  Mr. Thunder 62 y.o. male returns for routine follow-up for squamous cell carcinoma of the right lung. He  is here today with his girlfriend. He is still having a lot of right sided back pain. He is only getting minimal relief with the increased dose of pain medication. He has also had some nausea and vomiting for the past few days but this has improved. He is SOB with exertion and has a cough. Denies hemoptysis. Denies any diarrhea. Denies any fevers or recent infections. Denies any headaches. He reports he has no appetite or energy right now. He has lost 8 pounds since his last visit. He is going to try and increase his food intake. He drinks 4 ensures daily.     REVIEW OF SYSTEMS:  Review of Systems  Musculoskeletal: Positive for back pain (right side).  All other systems reviewed and are negative.    PAST MEDICAL/SURGICAL HISTORY:  Past Medical History:  Diagnosis Date  . Bursitis   . CAD (coronary artery disease)     s/p stent placement unknown artery 12/2014,  . [facility])    prostate cancer  . COPD (chronic obstructive pulmonary disease) ([place])   . Diabetes mellitus ([place])   . GERD (gastroesophageal reflux disease)   . History of prostate cancer   . Hyperlipemia   . Hypertension   . Peripheral arterial disease ([place])    a. h/o stents. b. 03/2016  - successful PTA and covered stenting using overlapping Viabahn covered stents of a long segment in-stent restenosis of previously placed nitinol self expanding stents in the proximal mid and distal right SFA.  . Tobacco abuse    Past Surgical History:  Procedure Laterality Date  . COLONOSCOPY WITH PROPOFOL N/A 11/22/2016   Procedure: COLONOSCOPY WITH PROPOFOL;  Surgeon: [name], MD;  Location[facility]O [facility];  Service: Endoscopy;  Laterality: N/A;  10:00 am  . CORONARY STENT PLACEMENT  12-2014   [facility] [employer]  . FEMORAL ARTERY STENT Bilateral 03/2013  . PERIPHERAL VASCULAR CATHETERIZATION N/A 04/04/2016   Procedure: Lower Extremity Angiography;  Surgeon: Lorretta Harp, MD;  Location: [place] CV LAB;   Service: Cardiovascular;  Laterality: N/A;  . PERIPHERAL VASCULAR CATHETERIZATION N/A 04/04/2016   Procedure: Abdominal Aortogram;  Surgeon: Lorretta Harp, MD;  Location: [place] CV LAB;  Service: Cardiovascular;  Laterality: N/A;  . PERIPHERAL VASCULAR CATHETERIZATION  04/04/2016   Procedure: Peripheral Vascular Intervention;  Surgeon: Lorretta Harp, MD;  Location: [place] CV LAB;  Service: Cardiovascular;;  rt SFA stent  . POLYPECTOMY  11/22/2016   Procedure: POLYPECTOMY;  Surgeon: [name], MD;  Location[facility];  Service: Endoscopy;;  descending colon polyp, transverse colon polyp, sigmoid colon polyp, rectal polyp  . PORTACATH PLACEMENT Left 02/19/2018   Procedure: INSERTION PORT-A-CATH;  Surgeon: Aviva Signs, MD;  Location: [facility] ORS;  Service: General;  Laterality: Left;  . STENT PLACEMENT VASCULAR ([place] [facility])  2013     SOCIAL HISTORY:  Social History   Socioeconomic History  . Marital status: Single    Spouse name: Not on file  . Number of children:  Not on file  . Years of education: Not on file  . Highest education level: Not on file  Occupational History  . Not on file  Social Needs  . Financial resource strain: Not on file  . Food insecurity:    Worry: Not on file    Inability: Not on file  . Transportation needs:    Medical: Not on file    Non-medical: Not on file  Tobacco Use  . Smoking status: Current Every Day Smoker    Packs/day: 0.25    Years: 45.00    Pack years: 11.25    Types: Cigarettes    Start date: 02/12/1971  . Smokeless tobacco: Never Used  Substance and Sexual Activity  . Alcohol use: Not Currently    Alcohol/week: 0.0 standard drinks    Comment: stopped 2 weeks ago.  . Drug use: No  . Sexual activity: Yes  Lifestyle  . Physical activity:    Days per week: Not on file    Minutes per session: Not on file  . Stress: Not on file  Relationships  . Social connections:    Talks on phone: Not on file    Gets together: Not on  file    Attends religious service: Not on file    Active member of club or organization: Not on file    Attends meetings of clubs or organizations: Not on file    Relationship status: Not on file  . Intimate partner violence:    Fear of current or ex partner: Not on file    Emotionally abused: Not on file    Physically abused: Not on file    Forced sexual activity: Not on file  Other Topics Concern  . Not on file  Social History Narrative  . Not on file    FAMILY HISTORY:  Family History  Problem Relation Age of Onset  . Diabetes Mother   . Heart disease Mother   . Hypertension Mother   . Healthy Brother   . Other Brother        accident   . Arthritis Sister   . Arthritis Sister   . SIDS Brother   . Colon cancer Neg Hx     CURRENT MEDICATIONS:  Outpatient Encounter Medications as of 10/04/2018  Medication Sig  . aspirin 81 MG chewable tablet Chew 81 mg by mouth daily.  [name] atorvastatin (LIPITOR) 80 MG tablet Take 80 mg by mouth at bedtime.   [name] CARBOPLATIN IV Inject into the vein. 6 weekly treatments with radiation  . cilostazol (PLETAL) 50 MG tablet TAKE 1 TABLET(50 MG) BY MOUTH TWICE DAILY  . Coenzyme Q10 100 MG capsule Take 100 mg by mouth at bedtime.   . Diphenhyd-Hydrocort-Nystatin (FIRST-DUKES MOUTHWASH MT) Take by mouth.  . dronabinol (MARINOL) 5 MG capsule Take 1 capsule (5 mg total) by mouth 2 (two) times daily before a meal.  . FLOVENT HFA 110 MCG/ACT inhaler Inhale 1 puff into the lungs 2 (two) times daily.  . fluticasone (FLONASE) 50 MCG/ACT nasal spray Place 1 spray into both nostrils daily.  [name] HYDROmorphone (DILAUDID) 2 MG tablet Take 1 tablet (2 mg total) by mouth every 4 (four) hours as needed for severe pain.  . hydrOXYzine (ATARAX/VISTARIL) 50 MG tablet Take by mouth.  [name] ipratropium-albuterol (DUONEB) 0.5-2.5 (3) MG/3ML SOLN Take 3 mLs by nebulization every 6 (six) hours as needed (for shortness of breath).  . lidocaine-prilocaine (EMLA) cream Apply a  quarter size amount to affected area 1  hour prior to coming to chemotherapy.  Do not rub in.  Cover with plastic wrap.  . megestrol (MEGACE) 20 MG tablet Take by mouth.  . metFORMIN (GLUCOPHAGE) 500 MG tablet Take 500 mg by mouth 2 (two) times daily.   . methocarbamol (ROBAXIN) 500 MG tablet   . metoprolol succinate (TOPROL-XL) 50 MG 24 hr tablet Take 50 mg by mouth at bedtime.   [name] nystatin (MYCOSTATIN) 100000 UNIT/ML suspension   . Omega-3 Fatty Acids (FISH OIL) 1000 MG CAPS Take 1 capsule by mouth 2 (two) times daily.   . ondansetron (ZOFRAN) 8 MG tablet Take 1 tablet (8 mg total) by mouth every 8 (eight) hours as needed for nausea or vomiting.  [name] PACLitaxel (TAXOL IV) Inject into the vein. 6 weekly treatments with radiation  . pantoprazole (PROTONIX) 40 MG tablet Take 40 mg by mouth 2 (two) times daily.  . prochlorperazine (COMPAZINE) 10 MG tablet Take 1 tablet (10 mg total) by mouth every 6 (six) hours as needed for nausea or vomiting.  . triamcinolone cream (KENALOG) 0.1 % Apply topically 2 (two) times daily.  [name] HYDROcodone-acetaminophen (NORCO) 7.5-325 MG tablet One tablet as needed twice a day for pain. (Patient not taking: Reported on 10/04/2018)  . oxyCODONE-acetaminophen (PERCOCET) 10-325 MG tablet Take 1 tablet by mouth every 6 (six) hours as needed for pain. (Patient not taking: Reported on 10/04/2018)   No facility-administered encounter medications on file as of 10/04/2018.     ALLERGIES:  Allergies  Allergen Reactions  . Lisinopril Anaphylaxis  . Penicillins Anaphylaxis    Has patient had a PCN reaction causing immediate rash, facial/tongue/throat swelling, SOB or lightheadedness with hypotension:Yes Has patient had a PCN reaction causing severe rash involving mucus membranes or skin necrosis:unsure Has patient had a PCN reaction that required hospitalization:unsure Has patient had a PCN reaction occurring within the last 10 years:~10 years per patient If all of the above  answers are "NO", then may proceed with Cephalosporin use.   . Ace Inhibitors Swelling     PHYSICAL EXAM:  ECOG Performance status: 1  VITAL SIGNS: BP:93/61. P:128, R:18, O2:99% Weight:122.4  Physical Exam  Constitutional: He is oriented to person, place, and time. He appears well-developed and well-nourished.  Musculoskeletal: Normal range of motion.  Neurological: He is alert and oriented to person, place, and time.  Skin: Skin is warm and dry.  Psychiatric: He has a normal mood and affect. His behavior is normal. Judgment and thought content normal.     LABORATORY DATA:  I have reviewed the labs as listed.  CBC    Component Value Date/Time   WBC 16.0 (H) 10/04/2018 0850   RBC 3.92 (L) 10/04/2018 0850   HGB 10.6 (L) 10/04/2018 0850   HCT 33.9 (L) 10/04/2018 0850   PLT 708 (H) 10/04/2018 0850   MCV 86.5 10/04/2018 0850   MCH 27.0 10/04/2018 0850   MCHC 31.3 10/04/2018 0850   RDW 14.9 10/04/2018 0850   LYMPHSABS 1.1 10/04/2018 0850   MONOABS 1.4 (H) 10/04/2018 0850   EOSABS 0.3 10/04/2018 0850   BASOSABS 0.1 10/04/2018 0850   CMP Latest Ref Rng & Units 10/04/2018 09/19/2018 09/05/2018  Glucose 70 - 99 mg/dL 134(H) 128(H) 118(H)  BUN 8 - 23 mg/dL 11 9 7(L)  Creatinine 0.61 - 1.24 mg/dL 0.61 0.59(L) 0.49(L)  Sodium 135 - 145 mmol/L 133(L) 138 137  Potassium 3.5 - 5.1 mmol/L 3.4(L) 4.6 4.4  Chloride 98 - 111 mmol/L 97(L) 105 103  CO2 22 -  32 mmol/L 24 21(L) 23  Calcium 8.9 - 10.3 mg/dL 10.7(H) 10.0 9.7  Total Protein 6.5 - 8.1 g/dL 8.1 7.8 7.7  Total Bilirubin 0.3 - 1.2 mg/dL 0.2(L) 0.4 0.5  Alkaline Phos 38 - 126 U/L 80 94 82  AST 15 - 41 U/L 15 17 12(L)  ALT 0 - 44 U/L 13 16 13        DIAGNOSTIC IMAGING:  I have independently reviewed the scans and discussed with the patient.  I have reviewed Francene Finders, NP's note and agree with the documentation.  I personally performed a face-to-face visit, made revisions and my assessment and plan is as  follows.     ASSESSMENT & PLAN:   Squamous cell carcinoma of right lung ([place]) 1.  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 -Patient evaluated by Dr. Roxan Hockey and was recommended to have preoperative chemoradiation therapy. -Chemoradiation therapy with carboplatin and paclitaxel from 02/20/2018 through 04/04/2018. -I have reviewed the results of the CT scan of the chest dated 04/27/2018 which showed regression of the tumor which is sub-solid in appearance.  No new areas were seen. - He was started on consolidation Durvalumab on 05/03/2018.  He is tolerating it very well. -We discussed the results of the CT scan of the chest with contrast dated 07/31/2018 which shows slight interval worsening of destruction of the posterolateral right second and third ribs.  There was also mention of slight worsening by few millimeters of the right apical lung mass. - I have discussed the findings on the PET CT scan dated 08/30/2018 showing peripheral right apical tumor with rib and chest wall invasion, progressive compared to prior PET scan, with medial competence and increased chest wall invasion.  SUV max is similar.  Right paratracheal/mediastinal lymph node demonstrates increased hypermetabolism. - I talked to Dr.Yanagihara.  He will give palliative radiation therapy between 10-15 fractions to control pain. - I talked to him about palliative chemotherapy for his recurrent cancer.  We will discontinue durvalumab.  We will start him on combination therapy with carboplatin AUC 5 on day 1, gemcitabine thousand milligrams per meter squared on day 1, day 8 every 21 days.  We talked about side effects in detail.  We plan to start it after he completes palliative radiation therapy for pain control.  2.  Weight loss: His weight has been stable since last visit.  3.  Recurrent prostate cancer: He had  a history of prostate cancer and reportedly received radiation therapy 6-7 years ago.  He had prostate biopsies on 01/17/2018 which showed recurrence with a Gleason score (5+5)=10.  He will be treated with GnRH agonist.  4.  Cancer related pain: -He has worsening of the right-sided back pain.  Hydrocodone and Percocet did not help.  I started him on Dilaudid 2 mg every 4 hours as needed last Friday.  Dilaudid is holding his pain only for 35 minutes.  He is taking Goody powders every 3 hours. -I have told him to cut back on Goody powders to 2-3 times a day.  He was told to double up on Dilaudid to 4 mg every 3 hours.  He was told not to take them if he is very drowsy.       Orders placed this encounter:  No orders of the defined types were placed in this encounter.     Derek Jack, MD [facility] [phone]

## 2018-10-04 NOTE — Assessment & Plan Note (Signed)
1.  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 -Patient evaluated by Dr. Roxan Hockey and was recommended to have preoperative chemoradiation therapy. -Chemoradiation therapy with carboplatin and paclitaxel from 02/20/2018 through 04/04/2018. -I have reviewed the results of the CT scan of the chest dated 04/27/2018 which showed regression of the tumor which is sub-solid in appearance.  No new areas were seen. - He was started on consolidation Durvalumab on 05/03/2018.  He is tolerating it very well. -We discussed the results of the CT scan of the chest with contrast dated 07/31/2018 which shows slight interval worsening of destruction of the posterolateral right second and third ribs.  There was also mention of slight worsening by few millimeters of the right apical lung mass. - I have discussed the findings on the PET CT scan dated 08/30/2018 showing peripheral right apical tumor with rib and chest wall invasion, progressive compared to prior PET scan, with medial competence and increased chest wall invasion.  SUV max is similar.  Right paratracheal/mediastinal lymph node demonstrates increased hypermetabolism. - I talked to Dr.Yanagihara.  He will give palliative radiation therapy between 10-15 fractions to control pain. - I talked to him about palliative chemotherapy for his recurrent cancer.  We will discontinue durvalumab.  We will start him on combination therapy with carboplatin AUC 5 on day 1, gemcitabine thousand milligrams per meter squared on day 1, day 8 every 21 days.  We talked about side effects in detail.  We plan to start it after he completes palliative radiation therapy for pain control.  2.  Weight loss: His weight has been stable since last visit.  3.  Recurrent prostate cancer: He had a history of prostate cancer and reportedly received radiation therapy 6-7 years ago.   He had prostate biopsies on 01/17/2018 which showed recurrence with a Gleason score (5+5)=10.  He will be treated with GnRH agonist.  4.  Cancer related pain: -He has worsening of the right-sided back pain.  Hydrocodone and Percocet did not help.  I started him on Dilaudid 2 mg every 4 hours as needed last Friday.  Dilaudid is holding his pain only for 35 minutes.  He is taking Goody powders every 3 hours. -I have told him to cut back on Goody powders to 2-3 times a day.  He was told to double up on Dilaudid to 4 mg every 3 hours.  He was told not to take them if he is very drowsy.

## 2018-10-08 ENCOUNTER — Other Ambulatory Visit (HOSPITAL_COMMUNITY): Payer: Self-pay | Admitting: *Deleted

## 2018-10-08 ENCOUNTER — Other Ambulatory Visit (HOSPITAL_COMMUNITY): Payer: Self-pay | Admitting: Hematology

## 2018-10-08 ENCOUNTER — Other Ambulatory Visit (HOSPITAL_COMMUNITY): Payer: Self-pay | Admitting: Nurse Practitioner

## 2018-10-08 DIAGNOSIS — C3491 Malignant neoplasm of unspecified part of right bronchus or lung: Secondary | ICD-10-CM

## 2018-10-08 MED ORDER — HYDROMORPHONE HCL 2 MG PO TABS: 4.0000 mg | ORAL_TABLET | ORAL | 0 refills | Status: DC | PRN

## 2018-10-10 ENCOUNTER — Ambulatory Visit: Payer: Medicare Other | Admitting: Urology

## 2018-10-15 ENCOUNTER — Telehealth (HOSPITAL_COMMUNITY): Payer: Self-pay | Admitting: *Deleted

## 2018-10-15 ENCOUNTER — Encounter (HOSPITAL_COMMUNITY): Payer: Self-pay | Admitting: *Deleted

## 2018-10-15 NOTE — Progress Notes (Signed)
Notification from patient that he will be receiving his first and only radiation treatment on Wednesday this week.  I spoke with Dr. Delton Coombes and he wants to start patient's chemotherapy this week.  Patient is scheduled and is aware of appointments.

## 2018-10-15 NOTE — Telephone Encounter (Signed)
Pt is very confused about what his plan is. If he's going to have chemo or immunotherapy or what is going to happen. I spoke with Dr. Delton Coombes and he advised that the pt is to have radiation and then start chemo on Friday. I informed the pt's spouse about this and gave her the appointment times and she verbalized understanding.

## 2018-10-16 NOTE — Progress Notes (Signed)
Chemotherapy teaching packet pulled together.

## 2018-10-16 NOTE — Patient Instructions (Signed)
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  We will be treating you with palliative intent. This means that your cancer is not curable but it is treatable.  We will be giving you carboplatin (paraplatin) and gemcitabine (gemzar).  You will get both medications on week one, only the gemzar on week 2 and then you will get a week off. This will happen every 21 days.  You will see the doctor regularly throughout treatment.  We monitor your lab work prior to every treatment. The doctor monitors your response to treatment by the way you are feeling, your blood work, and scans periodically.  There will be wait times while you are here for treatment.  It will take about 30 minutes to 1 hour for your lab work to result.  Then there will be wait times while pharmacy mixes your medications.    You will receive the following premedication prior to each treatment:   Aloxi -  High powered nausea medication given to reduce the risk of chemotherapy induced nausea  Dexamethasone - steroid - given to reduce the risk of you having an allergic reaction to the chemotherapy.  Dexamethasone can cause you to feel energized, nervous/anxious/jittery, make you have trouble sleeping, and/or make you feel hot/flushed in the face/neck and/or look pink/red in the face/neck. These side effects will pass as the medicine wears off  Carboplatin (Paraplatin, CBDCA)  About This Drug Carboplatin is used to treat cancer. It is given in the vein (IV).  Possible Side Effects . Bone marrow suppression. This is a decrease in the number of white blood cells, red blood cells, and platelets. This may raise your risk of infection, make you tired and weak (fatigue), and raise your risk of bleeding. . Nausea and vomiting (throwing up) . Weakness . Changes in your liver function . Changes in your kidney function . Electrolyte changes . Pain  Note: Each  of the side effects above was reported in 20% or greater of patients treated with carboplatin. Not all possible side effects are included above.  Warnings and Precautions . Severe bone marrow suppression . Allergic reactions, including anaphylaxis are rare but may happen in some patients. Signs of allergic reaction to this drug may be swelling of the face, feeling like your tongue or throat are swelling, trouble breathing, rash, itching, fever, chills, feeling dizzy, and/or feeling that your heart is beating in a fast or not normal way. If this happens, do not take another dose of this drug. You should get urgent medical treatment. . Severe nausea and vomiting . Effects on the nerves are called peripheral neuropathy. This risk is increased if you are over the age of 80 or if you have received other medicine with risk of peripheral neuropathy. You may feel numbness, tingling, or pain in your hands and feet. It may be hard for you to button your clothes, open jars, or walk as usual. The effect on the nerves may get worse with more doses of the drug. These effects get better in some people after the drug is stopped but it does not get better in all people. [name] Blurred vision, loss of vision or other changes in eyesight . Decreased hearing . Skin and tissue irritation including redness, pain, warmth, or swelling at the IV site if the drug leaks out of the vein and into nearby tissue. . Severe changes in your kidney function, which can cause kidney failure .  Severe changes in your liver function, which can cause liver failure  Note: Some of the side effects above are very rare. If you have concerns and/or questions, please discuss them with your medical team.  Important Information . This drug may be present in the saliva, tears, sweat, urine, stool, vomit, semen, and vaginal secretions. Talk to your doctor and/or your nurse about the necessary precautions to take during this time.  Treating  Side Effects . Manage tiredness by pacing your activities for the day. . Be sure to include periods of rest between energy-draining activities. . To decrease the risk of infection, wash your hands regularly. . Avoid close contact with people who have a cold, the flu, or other infections. . Take your temperature as your doctor or nurse tells you, and whenever you feel like you may have a fever. . To help decrease the risk of bleeding, use a soft toothbrush. Check with your nurse before using dental floss. . Be very careful when using knives or tools. . Use an electric shaver instead of a razor. . Drink plenty of fluids (a minimum of eight glasses per day is recommended). . If you throw up or have loose bowel movements, you should drink more fluids so that you do not become dehydrated (lack of water in the body from losing too much fluid). . To help with nausea and vomiting, eat small, frequent meals instead of three large meals a day. Choose foods and drinks that are at room temperature. Ask your nurse or doctor about other helpful tips and medicine that is available to help stop or lessen these symptoms. . If you have numbness and tingling in your hands and feet, be careful when cooking, walking, and handling sharp objects and hot liquids. [name] Keeping your pain under control is important to your well-being. Please tell your doctor or nurse if you are experiencing pain.  Food and Drug Interactions . There are no known interactions of carboplatin with food. . This drug may interact with other medicines. Tell your doctor and pharmacist about all the prescription and over-the-counter medicines and dietary supplements (vitamins, minerals, herbs and others) that you are taking at this time. Also, check with your doctor or pharmacist before starting any new prescription or over-the-counter medicines, or dietary supplements to make sure that there are no interactions.  When to Call the  Doctor Call your doctor or nurse if you have any of these symptoms and/or any new or unusual symptoms: . Fever of 100.4 F (38 C) or higher . Chills . Tiredness that interferes with your daily activities . Feeling dizzy or lightheaded . Easy bleeding or bruising . Nausea that stops you from eating or drinking and/or is not relieved by prescribed medicines . Throwing up more than 3 times a day . Blurred vision or other changes in eyesight . Decrease in hearing or ringing in the ear . Signs of allergic reaction: swelling of the face, feeling like your tongue or throat are swelling, trouble breathing, rash, itching, fever, chills, feeling dizzy, and/or feeling that your heart is beating in a fast or not normal way. If this happens, call 911 for emergency care. . While you are getting this drug, please tell your nurse right away if you have any pain, redness, or swelling at the site of the IV infusion . Signs of possible liver problems: dark urine, pale bowel movements, bad stomach pain, feeling very tired and weak, unusual itching, or yellowing of the eyes or skin .  Decreased urine, or very dark urine . Numbness, tingling, or pain in your hands and feet . Pain that does not go away or is not relieved by prescribed medicine . If you think you may be pregnant  Reproduction Warnings . Pregnancy warning: This drug may have harmful effects on the unborn baby. Women of child bearing potential should use effective methods of birth control during your cancer treatment. Let your doctor know right away if you think you may be pregnant. . Breastfeeding warning: It is not known if this drug passes into breast milk. For this reason, women should not breastfeed during treatment because this drug could enter the breast milk and cause harm to a breastfeeding baby. . Fertility warning: Human fertility studies have not been done with this drug. Talk with your doctor or nurse if you plan to have children.  Ask for information on sperm or egg banking.  Gemcitabine (Gemzar)  About This Drug Gemcitabine is used to treat cancer. It is given in the vein (IV).  Possible Side Effects . Bone marrow suppression. This is a decrease in the number of white blood cells, red blood cells, and platelets. This may raise your risk of infection, make you tired and weak (fatigue), and raise your risk of bleeding . Fever . Trouble breathing . Nausea and throwing up (vomiting) . Changes in your liver function . Increased protein in your urine, which can affect how your kidneys work . Blood in your urine . Rash . Swelling of your legs, ankles and/or feet  Note: Each of the side effects above was reported in 20% or greater of patients treated with Gemcitabine. Not all possible side effects are included above.  Warnings and Precautions . Severe bone marrow suppression . Inflammation (swelling) of the lungs and/or thickening of the lung tissues, which may be lifethreatening. You may have a dry cough or trouble breathing. . Changes in your kidney function, which can cause kidney failure . Changes in your liver function, which can cause liver failure and may be life-threatening . If you have received radiation treatments, your skin may become red and/or you may develop soreness of the mouth and throat after gemcitabine. This reaction is called "recall." Your body is recalling, or remembering, that it had radiation therapy. . A syndrome where fluid from your veins can leak into your tissues and cause a decrease in your blood pressure and fluid to accumulate in your tissues and/or lungs. . A syndrome can occur that causes changes to kidney and liver function in combination with a decrease in red blood cells. Kidney failure may result which may be life-threatening. . Changes in your central nervous system can happen. The central nervous system is made up of your brain and spinal cord. You could feel extreme  tiredness, agitation, confusion, hallucinations (see or hear things that are not there), have trouble understanding or speaking, loss of control of your bowels or bladder, eyesight changes, numbness or lack of strength to your arms, legs, face, or body, seizures or coma. If you start to have any of these symptoms let your doctor know right away.  Note: Some of the side effects above are very rare. If you have concerns and/or questions, please discuss them with your medical team.  Important Information . This drug may be present in the saliva, tears, sweat, urine, stool, vomit, semen, and vaginal secretions. Talk to your doctor and/or your nurse about the necessary precautions to take during this time.  Treating Side Effects .  Manage tiredness by pacing your activities for the day. . Be sure to include periods of rest between energy-draining activities. . To decrease the risk of infection, wash your hands regularly. . Avoid close contact with people who have a cold, the flu, or other infections. . Take your temperature as your doctor or nurse tells you, and whenever you feel like you may have a fever. . To help decrease bleeding, use a soft toothbrush. Check with your nurse before using dental floss. . Be very careful when using knives or tools. . Use an electric shaver instead of a razor. . Drink plenty of fluids (a minimum of eight glasses per day is recommended). . If you throw up or have loose bowel movements, you should drink more fluids so that you do not become dehydrated (lack of water in the body from losing too much fluid). . To help with nausea and vomiting, eat small, frequent meals instead of three large meals a day. Choose foods and drinks that are at room temperature. Ask your nurse or doctor about other helpful tips and medicine that is available to help stop or lessen these symptoms. . If you get a rash do not put anything on it unless your doctor or nurse says you may.  Keep the area around the rash clean and dry. Ask your doctor for medicine if your rash bothers you. . If you received radiation, and your skin becomes red or irritated again, or you develop soreness of the mouth and throat, follow the same care instructions you did during radiation treatment. Be sure to tell the nurse or doctor administering your chemotherapy about your skin changes.  Food and Drug Interactions . There are no known interactions of gemcitabine with food. . This drug may interact with other medicines. Tell your doctor and pharmacist about all the prescription and over-the-counter medicines and dietary supplements (vitamins, minerals, herbs and others) that you are taking at this time. Also, check with your doctor or pharmacist before starting any new prescription or over-the-counter medicines, or dietary supplements to make sure that there are no interactions.  When to Call the Doctor Call your doctor or nurse if you have any of these symptoms and/or any new or unusual symptoms: . Fever of 100.4 F (38 C) or higher . Chills . Tiredness that interferes with your daily activities . Feeling dizzy or lightheaded . Pain in your chest . Dry cough . Wheezing and/or trouble breathing . Confusion and/or agitation . Symptoms of a seizure such as confusion, blacking out, passing out, loss of hearing or vision, blurred vision, unusual smells or tastes (such as burning rubber), trouble talking, tremors or shaking in parts or all of the body, repeated body movements, tense muscles that do not relax, and loss of control of urine and bowels. If you or your family member suspects you are having a seizure, call 911 right away. . Hallucinations . Trouble understanding or speaking . Blurry vision or changes in your eyesight . Numbness or lack of strength to your arms, legs, face, or body . Easy bleeding or bruising . Nausea that stops you from eating or drinking and/or is not relieved  by prescribed medicines . Throwing up more than 3 times a day . Swelling of legs, ankles, or feet . Weight gain of 5 pounds in one week (fluid retention) . Blood in urine . Decreased urine or very dark urine . Foamy or bubbly-looking urine . A new rash/itching or a rash that is not  relieved by prescribed medicines . Signs of possible liver problems: dark urine, pale bowel movements, bad stomach pain, feeling very tired and weak, unusual itching, or yellowing of the eyes or skin . If you think you may be pregnant or may have impregnated your partner  Reproduction Warnings . Pregnancy warning: This drug can have harmful effects on the unborn baby. Women of childbearing potential should use effective methods of birth control during your cancer treatment and for 6 months after treatment. Men with male partners of childbearing potential should use effective methods of birth control during your cancer treatment and for 3 months after your cancer treatment. Let your doctor know right away if you think you may be pregnant or may have impregnated your partner. . Breastfeeding warning: Women should not breastfeed during treatment and for 1 week after treatment because this drug could enter the breast milk and cause harm to a breastfeeding baby. . Fertility warning: In men, this drug may affect your ability to have children in the future. Talk with your doctor or nurse if you plan to have children. Ask for information on sperm banking.   SELF CARE ACTIVITIES WHILE ON CHEMOTHERAPY:  Hydration Increase your fluid intake 48 hours prior to treatment and drink at least 8 to 12 cups (64 ounces) of water/decaffeinated beverages per day after treatment. You can still have your cup of coffee or soda but these beverages do not count as part of your 8 to 12 cups that you need to drink daily. No alcohol intake.  Medications Continue taking your normal prescription medication as prescribed.  If you start  any new herbal or new supplements please let us know first to make sure it is safe.  Mouth Care Have teeth cleaned professionally before starting treatment. Keep dentures and partial plates clean. Use soft toothbrush and do not use mouthwashes that contain alcohol. Biotene is a good mouthwash that is available at most pharmacies or may be ordered by calling [phone]. Use warm salt water gargles (1 teaspoon salt per 1 quart warm water) before and after meals and at bedtime. Or you may rinse with 2 tablespoons of three-percent hydrogen peroxide mixed in eight ounces of water. If you are still having problems with your mouth or sores in your mouth please call the clinic. If you need dental work, please let the doctor know before you go for your appointment so that we can coordinate the best possible time for you in regards to your chemo regimen. You need to also let your dentist know that you are actively taking chemo. We may need to do labs prior to your dental appointment.  Skin Care Always use sunscreen that has not expired and with SPF (Sun Protection Factor) of 50 or higher. Wear hats to protect your head from the sun. Remember to use sunscreen on your hands, ears, face, & feet.  Use good moisturizing lotions such as udder cream, eucerin, or even Vaseline. Some chemotherapies can cause dry skin, color changes in your skin and nails.    . Avoid long, hot showers or baths. . Use gentle, fragrance-free soaps and laundry detergent. . Use moisturizers, preferably creams or ointments rather than lotions because the thicker consistency is better at preventing skin dehydration. Apply the cream or ointment within 15 minutes of showering. Reapply moisturizer at night, and moisturize your hands every time after you wash them.  Hair Loss (if your doctor says your hair will fall out)  . If your doctor says that  your hair is likely to fall out, decide before you begin chemo whether you want to wear a wig.  You may want to shop before treatment to match your hair color. . Hats, turbans, and scarves can also camouflage hair loss, although some people prefer to leave their heads uncovered. If you go bare-headed outdoors, be sure to use sunscreen on your scalp. . Cut your hair short. It eases the inconvenience of shedding lots of hair, but it also can reduce the emotional impact of watching your hair fall out. . Don't perm or color your hair during chemotherapy. Those chemical treatments are already damaging to hair and can enhance hair loss. Once your chemo treatments are done and your hair has grown back, it's OK to resume dyeing or perming hair. With chemotherapy, hair loss is almost always temporary. But when it grows back, it may be a different color or texture. In older adults who still had hair color before chemotherapy, the new growth may be completely gray.  Often, new hair is very fine and soft.  Infection Prevention Please wash your hands for at least 30 seconds using warm soapy water. Handwashing is the #1 way to prevent the spread of germs. Stay away from sick people or people who are getting over a cold. If you develop respiratory systems such as green/yellow mucus production or productive cough or persistent cough let us know and we will see if you need an antibiotic. It is a good idea to keep a pair of gloves on when going into grocery stores/[employer] to decrease your risk of coming into contact with germs on the carts, etc. Carry alcohol hand gel with you at all times and use it frequently if out in public. If your temperature reaches 100.5 or higher please call the clinic and let us know.  If it is after hours or on the weekend please go to the ER if your temperature is over 100.5.  Please have your own personal thermometer at home to use.    Sex and bodily fluids If you are going to have sex, a condom must be used to protect the person that isn't taking chemotherapy. Chemo can decrease your  libido (sex drive). For a few days after chemotherapy, chemotherapy can be excreted through your bodily fluids.  When using the toilet please close the lid and flush the toilet twice.  Do this for a few day after you have had chemotherapy.   Effects of chemotherapy on your sex life Some changes are simple and won't last long. They won't affect your sex life permanently. Sometimes you may feel: . too tired . not strong enough to be very active . sick or sore  . not in the mood . anxious or low Your anxiety might not seem related to sex. For example, you may be worried about the cancer and how your treatment is going. Or you may be worried about money, or about how you family are coping with your illness. These things can cause stress, which can affect your interest in sex. It's important to talk to your partner about how you feel. Remember - the changes to your sex life don't usually last long. There's usually no medical reason to stop having sex during chemo. The drugs won't have any long term physical effects on your performance or enjoyment of sex. Cancer can't be passed on to your partner during sex  Contraception It's important to use reliable contraception during treatment. Avoid getting pregnant while you or  your partner are having chemotherapy. This is because the drugs may harm the baby. Sometimes chemotherapy drugs can leave a man or woman infertile.  This means you would not be able to have children in the future. You might want to talk to someone about permanent infertility. It can be very difficult to learn that you may no longer be able to have children. Some people find counselling helpful. There might be ways to preserve your fertility, although this is easier for men than for women. You may want to speak to a fertility expert. You can talk about sperm banking or harvesting your eggs. You can also ask about other fertility options, such as donor eggs. If you have or have had breast  cancer, your doctor might advise you not to take the contraceptive pill. This is because the hormones in it might affect the cancer.  It is not known for sure whether or not chemotherapy drugs can be passed on through semen or secretions from the vagina. Because of this some doctors advise people to use a barrier method if you have sex during treatment. This applies to vaginal, anal or oral sex. Generally, doctors advise a barrier method only for the time you are actually having the treatment and for about a week after your treatment. Advice like this can be worrying, but this does not mean that you have to avoid being intimate with your partner. You can still have close contact with your partner and continue to enjoy sex.  Animals If you have cats or birds we just ask that you not change the litter or change the cage.  Please have someone else do this for you while you are on chemotherapy.   Food Safety During and After Cancer Treatment Food safety is important for people both during and after cancer treatment. Cancer and cancer treatments, such as chemotherapy, radiation therapy, and stem cell/bone marrow transplantation, often weaken the immune system. This makes it harder for your body to protect itself from foodborne illness, also called food poisoning. Foodborne illness is caused by eating food that contains harmful bacteria, parasites, or viruses.  Foods to avoid Some foods have a higher risk of becoming tainted with bacteria. These include: [name] Unwashed fresh fruit and vegetables, especially leafy vegetables that can hide dirt and other contaminants . Raw sprouts, such as alfalfa sprouts . Raw or undercooked beef, especially ground beef, or other raw or undercooked meat and poultry . Fatty, fried, or spicy foods immediately before or after treatment.  These can sit heavy on your stomach and make you feel nauseous. . Raw or undercooked shellfish, such as oysters. . Sushi and sashimi, which  often contain raw fish.  . Unpasteurized beverages, such as unpasteurized fruit juices, raw milk, raw yogurt, or cider . Undercooked eggs, such as soft boiled, over easy, and poached; raw, unpasteurized eggs; or foods made with raw egg, such as homemade raw cookie dough and homemade mayonnaise Simple steps for food safety Shop smart. . Do not buy food stored or displayed in an unclean area. . Do not buy bruised or damaged fruits or vegetables. . Do not buy cans that have cracks, dents, or bulges. . Pick up foods that can spoil at the end of your shopping trip and store them in a cooler on the way home. Prepare and clean up foods carefully. . Rinse all fresh fruits and vegetables under running water, and dry them with a clean towel or paper towel. . Clean the top of  cans before opening them. . After preparing food, wash your hands for 20 seconds with hot water and soap. Pay special attention to areas between fingers and under nails. . Clean your utensils and dishes with hot water and soap. [name] Disinfect your kitchen and cutting boards using 1 teaspoon of liquid, unscented bleach mixed into 1 quart of water.   Dispose of old food. . Eat canned and packaged food before its expiration date (the "use by" or "best before" date). . Consume refrigerated leftovers within 3 to 4 days. After that time, throw out the food. Even if the food does not smell or look spoiled, it still may be unsafe. Some bacteria, such as Listeria, can grow even on foods stored in the refrigerator if they are kept for too long. Take precautions when eating out. . At restaurants, avoid buffets and salad bars where food sits out for a long time and comes in contact with many people. Food can become contaminated when someone with a virus, often a norovirus, or another "bug" handles it. . Put any leftover food in a "to-go" container yourself, rather than having the server do it. And, refrigerate leftovers as soon as you get  home. . Choose restaurants that are clean and that are willing to prepare your food as you order it cooked.   MEDICATIONS:                                                                                                                                                                Compazine/Prochlorperazine 10mg  tablet. Take 1 tablet every 6 hours as needed for nausea/vomiting. (This can make you sleepy)   EMLA cream. Apply a quarter size amount to port site 1 hour prior to chemo. Do not rub in. Cover with plastic wrap.   Over-the-Counter Meds:  Colace - 100 mg capsules - take 2 capsules daily.  If this doesn't help then you can increase to 2 capsules twice daily.  Call us if this does not help your bowels move.   Imodium 2mg  capsule. Take 2 capsules after the 1st loose stool and then 1 capsule every 2 hours until you go a total of 12 hours without having a loose stool. Call the [facility] if loose stools continue. If diarrhea occurs at bedtime, take 2 capsules at bedtime. Then take 2 capsules every 4 hours until morning. Call [facility].    Diarrhea Sheet   If you are having loose stools/diarrhea, please purchase Imodium and begin taking as outlined:  At the first sign of poorly formed or loose stools you should begin taking Imodium (loperamide) 2 mg capsules.  Take two caplets (4mg ) followed by one caplet (2mg ) every 2 hours until you have had no diarrhea for 12 hours.  During the  night take two caplets (4mg ) at bedtime and continue every 4 hours during the night until the morning.  Stop taking Imodium only after there is no sign of diarrhea for 12 hours.    Always call the [facility] if you are having loose stools/diarrhea that you can't get under control.  Loose stools/diarrhea leads to dehydration (loss of water) in your body.  We have other options of trying to get the loose stools/diarrhea to stop but you must let us know!   Constipation Sheet  Colace - 100 mg capsules  - take 2 capsules daily.  If this doesn't help then you can increase to 2 capsules twice daily.  Please call if the above does not work for you.   Do not go more than 2 days without a bowel movement.  It is very important that you do not become constipated.  It will make you feel sick to your stomach (nausea) and can cause abdominal pain and vomiting.   Nausea Sheet   Compazine/Prochlorperazine 10mg  tablet. Take 1 tablet every 6 hours as needed for nausea/vomiting. (This can make you sleepy)  If you are having persistent nausea (nausea that does not stop) please call the [facility] and let us know the amount of nausea that you are experiencing.  If you begin to vomit, you need to call the [facility] and if it is the weekend and you have vomited more than one time and can't get it to stop-go to the Emergency Room.  Persistent nausea/vomiting can lead to dehydration (loss of fluid in your body) and will make you feel terrible.   Ice chips, sips of clear liquids, foods that are @ room temperature, crackers, and toast tend to be better tolerated.   SYMPTOMS TO REPORT AS SOON AS POSSIBLE AFTER TREATMENT:   FEVER GREATER THAN 100.5 F  CHILLS WITH OR WITHOUT FEVER  NAUSEA AND VOMITING THAT IS NOT CONTROLLED WITH YOUR NAUSEA MEDICATION  UNUSUAL SHORTNESS OF BREATH  UNUSUAL BRUISING OR BLEEDING  TENDERNESS IN MOUTH AND THROAT WITH OR WITHOUT PRESENCE OF ULCERS  URINARY PROBLEMS  BOWEL PROBLEMS  UNUSUAL RASH      Wear comfortable clothing and clothing appropriate for easy access to any Portacath or PICC line. Let us know if there is anything that we can do to make your therapy better!    What to do if you need assistance after hours or on the weekends: CALL [phone].  HOLD on the line, do not hang up.  You will hear multiple messages but at the end you will be connected with a nurse triage line.  They will contact the doctor if necessary.  Most of the time they will be  able to assist you.  Do not call the hospital operator.      I have been informed and understand all of the instructions given to me and have received a copy. I have been instructed to call the clinic [phone] or my family physician as soon as possible for continued medical care, if indicated. I do not have any more questions at this time but understand that I may call the [facility] or the Patient Navigator at [phone] during office hours should I have questions or need assistance in obtaining follow-up care.

## 2018-10-19 ENCOUNTER — Inpatient Hospital Stay (HOSPITAL_COMMUNITY): Payer: Medicare Other

## 2018-10-19 ENCOUNTER — Encounter (HOSPITAL_COMMUNITY): Payer: Self-pay | Admitting: Hematology

## 2018-10-19 ENCOUNTER — Other Ambulatory Visit: Payer: Self-pay

## 2018-10-19 ENCOUNTER — Inpatient Hospital Stay (HOSPITAL_BASED_OUTPATIENT_CLINIC_OR_DEPARTMENT_OTHER): Payer: Medicare Other | Admitting: Hematology

## 2018-10-19 ENCOUNTER — Inpatient Hospital Stay (HOSPITAL_COMMUNITY): Payer: Medicare Other | Attending: Hematology

## 2018-10-19 VITALS — BP 104/62 | HR 102 | Temp 99.6°F | Resp 18

## 2018-10-19 DIAGNOSIS — F17210 Nicotine dependence, cigarettes, uncomplicated: Secondary | ICD-10-CM

## 2018-10-19 DIAGNOSIS — C3411 Malignant neoplasm of upper lobe, right bronchus or lung: Secondary | ICD-10-CM

## 2018-10-19 DIAGNOSIS — G893 Neoplasm related pain (acute) (chronic): Secondary | ICD-10-CM

## 2018-10-19 DIAGNOSIS — R634 Abnormal weight loss: Secondary | ICD-10-CM

## 2018-10-19 DIAGNOSIS — C3491 Malignant neoplasm of unspecified part of right bronchus or lung: Secondary | ICD-10-CM

## 2018-10-19 DIAGNOSIS — Z5111 Encounter for antineoplastic chemotherapy: Secondary | ICD-10-CM | POA: Diagnosis not present

## 2018-10-19 DIAGNOSIS — Z923 Personal history of irradiation: Secondary | ICD-10-CM

## 2018-10-19 DIAGNOSIS — C61 Malignant neoplasm of prostate: Secondary | ICD-10-CM | POA: Diagnosis not present

## 2018-10-19 DIAGNOSIS — Z9221 Personal history of antineoplastic chemotherapy: Secondary | ICD-10-CM

## 2018-10-19 LAB — CBC WITH DIFFERENTIAL/PLATELET
Abs Immature Granulocytes: 0.12 10*3/uL — ABNORMAL HIGH (ref 0.00–0.07)
Basophils Absolute: 0.1 10*3/uL (ref 0.0–0.1)
Basophils Relative: 0 %
Eosinophils Absolute: 0.2 10*3/uL (ref 0.0–0.5)
Eosinophils Relative: 1 %
HCT: 34.1 % — ABNORMAL LOW (ref 39.0–52.0)
Hemoglobin: 10.3 g/dL — ABNORMAL LOW (ref 13.0–17.0)
IMMATURE GRANULOCYTES: 1 %
LYMPHS ABS: 0.9 10*3/uL (ref 0.7–4.0)
Lymphocytes Relative: 5 %
MCH: 26.3 pg (ref 26.0–34.0)
MCHC: 30.2 g/dL (ref 30.0–36.0)
MCV: 87 fL (ref 80.0–100.0)
Monocytes Absolute: 1.5 10*3/uL — ABNORMAL HIGH (ref 0.1–1.0)
Monocytes Relative: 8 %
Neutro Abs: 17.2 10*3/uL — ABNORMAL HIGH (ref 1.7–7.7)
Neutrophils Relative %: 85 %
Platelets: 758 10*3/uL — ABNORMAL HIGH (ref 150–400)
RBC: 3.92 MIL/uL — ABNORMAL LOW (ref 4.22–5.81)
RDW: 15 % (ref 11.5–15.5)
WBC: 20 10*3/uL — ABNORMAL HIGH (ref 4.0–10.5)
nRBC: 0 % (ref 0.0–0.2)

## 2018-10-19 LAB — TSH: TSH: 1.904 u[IU]/mL (ref 0.350–4.500)

## 2018-10-19 LAB — COMPREHENSIVE METABOLIC PANEL
ALK PHOS: 90 U/L (ref 38–126)
ALT: 13 U/L (ref 0–44)
AST: 19 U/L (ref 15–41)
Albumin: 3.1 g/dL — ABNORMAL LOW (ref 3.5–5.0)
Anion gap: 13 (ref 5–15)
BUN: 8 mg/dL (ref 8–23)
CALCIUM: 10.3 mg/dL (ref 8.9–10.3)
CO2: 28 mmol/L (ref 22–32)
Chloride: 94 mmol/L — ABNORMAL LOW (ref 98–111)
Creatinine, Ser: 0.5 mg/dL — ABNORMAL LOW (ref 0.61–1.24)
GFR calc Af Amer: >60 mL/min (ref >60)
GFR calc non Af Amer: >60 mL/min (ref >60)
Glucose, Bld: 132 mg/dL — ABNORMAL HIGH (ref 70–99)
Potassium: 4.9 mmol/L (ref 3.5–5.1)
Sodium: 135 mmol/L (ref 135–145)
Total Bilirubin: 0.4 mg/dL (ref 0.3–1.2)
Total Protein: 8 g/dL (ref 6.5–8.1)

## 2018-10-19 MED ORDER — SODIUM CHLORIDE 0.9 % IV SOLN
10.0000 mg | Freq: Once | INTRAVENOUS | Status: AC
  Administered 2018-10-19: 10 mg via INTRAVENOUS
  Filled 2018-10-19: qty 1

## 2018-10-19 MED ORDER — HYDROMORPHONE HCL 2 MG PO TABS: 4.0000 mg | ORAL_TABLET | ORAL | 0 refills | Status: DC | PRN

## 2018-10-19 MED ORDER — SODIUM CHLORIDE 0.9 % IV SOLN
501.0000 mg | Freq: Once | INTRAVENOUS | Status: AC
  Administered 2018-10-19: 500 mg via INTRAVENOUS
  Filled 2018-10-19: qty 50

## 2018-10-19 MED ORDER — PALONOSETRON HCL INJECTION 0.25 MG/5ML
0.2500 mg | Freq: Once | INTRAVENOUS | Status: AC
  Administered 2018-10-19: 0.25 mg via INTRAVENOUS

## 2018-10-19 MED ORDER — SODIUM CHLORIDE 0.9 % IV SOLN
1000.0000 mg/m2 | Freq: Once | INTRAVENOUS | Status: AC
  Administered 2018-10-19: 1634 mg via INTRAVENOUS
  Filled 2018-10-19: qty 16.68

## 2018-10-19 MED ORDER — SODIUM CHLORIDE 0.9% FLUSH
10.0000 mL | INTRAVENOUS | Status: DC | PRN
  Administered 2018-10-19: 10 mL
  Filled 2018-10-19: qty 10

## 2018-10-19 MED ORDER — PALONOSETRON HCL INJECTION 0.25 MG/5ML
INTRAVENOUS | Status: AC
  Filled 2018-10-19: qty 5

## 2018-10-19 MED ORDER — HEPARIN SOD (PORK) LOCK FLUSH 100 UNIT/ML IV SOLN
500.0000 [IU] | Freq: Once | INTRAVENOUS | Status: AC | PRN
  Administered 2018-10-19: 500 [IU]
  Filled 2018-10-19: qty 5

## 2018-10-19 MED ORDER — SODIUM CHLORIDE 0.9 % IV SOLN
Freq: Once | INTRAVENOUS | Status: AC
  Administered 2018-10-19: 12:00:00 via INTRAVENOUS

## 2018-10-19 NOTE — Patient Instructions (Signed)
[facility] Discharge Instructions     Thank you for choosing  [facility] to provide your oncology and hematology care.  To afford each patient quality time with our provider, please arrive at least 15 minutes before your scheduled appointment time.   If you have a lab appointment with the Cancer Center please come in thru the  Main Entrance and check in at the main information desk  You need to re-schedule your appointment should you arrive 10 or more minutes late.  We strive to give you quality time with our providers, and arriving late affects you and other patients whose appointments are after yours.  Also, if you no show three or more times for appointments you may be dismissed from the clinic at the providers discretion.     Again, thank you for choosing [facility].  Our hope is that these requests will decrease the amount of time that you wait before being seen by our physicians.       _____________________________________________________________  Should you have questions after your visit to [facility], please contact our office at [phone] between the hours of 8:00 a.m. and 4:30 p.m.  Voicemails left after 4:00 p.m. will not be returned until the following business day.  For prescription refill requests, have your pharmacy contact our office and allow 72 hours.    Cancer Center Support Programs:   > Cancer Support Group  2nd Tuesday of the month 1pm-2pm, Journey Room    

## 2018-10-19 NOTE — Progress Notes (Signed)
Dr. Delton Coombes in for Office visit, labs reviewed and ok to treat today per MD.

## 2018-10-19 NOTE — Assessment & Plan Note (Signed)
1.  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 -Patient evaluated by Dr. Roxan Hockey and was recommended to have preoperative chemoradiation therapy. -Chemoradiation therapy with carboplatin and paclitaxel from 02/20/2018 through 04/04/2018. -I have reviewed the results of the CT scan of the chest dated 04/27/2018 which showed regression of the tumor which is sub-solid in appearance.  No new areas were seen. - He was started on consolidation Durvalumab on 05/03/2018.  He is tolerating it very well. -We discussed the results of the CT scan of the chest with contrast dated 07/31/2018 which shows slight interval worsening of destruction of the posterolateral right second and third ribs.  There was also mention of slight worsening by few millimeters of the right apical lung mass. - I have discussed the findings on the PET CT scan dated 08/30/2018 showing peripheral right apical tumor with rib and chest wall invasion, progressive compared to prior PET scan, with medial competence and increased chest wall invasion.  SUV max is similar.  Right paratracheal/mediastinal lymph node demonstrates increased hypermetabolism. - He received 1 dose of palliative radiation on 10/16/2018.  He did not notice any improvement in the pain. -We talked about starting him on chemotherapy with carboplatin AUC 5 on day 1 and gemcitabine thousand milligrams per meter square on day 1 and 8 every 21 days.  Hopefully this will help palliate his pain also.  We talked about side effects in detail.  We reviewed his labs.  He may proceed with his first treatment today. - We will see him back in 3 weeks for follow-up.  2.  Weight loss: He is continuing to lose weight secondary to his active cancer and intractable pain. - We have initiated chemotherapy today and hopefully it helps.  3.  Recurrent prostate cancer: He had a  history of prostate cancer and reportedly received radiation therapy 6-7 years ago.  He had prostate biopsies on 01/17/2018 which showed recurrence with a Gleason score (5+5)=10.  He will be treated with GnRH agonist.  4.  Cancer related pain: -He has right-sided scapular and back pain.  He is taking Dilaudid 4 mg every 3 hours.  He states Dilaudid is helping but it is not lasting more than 2 hours. - I will increase Dilaudid to 4 mg every 2 hours.

## 2018-10-19 NOTE — Progress Notes (Signed)
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 There is early bony invasion of the right second rib and cortical destruction indicating local rib/chest wall invasion by tumor. 2. There is additional pleural thickening and pleural calcification along the right posterior pleural surface which is not separately hypermetabolic. 3. Small mediastinal lymph nodes are not appreciably hypermetabolic. 4. Multifocal and some diffuse regions of high activity in the bowel are likely physiologic. 5. No findings of metastatic disease to the neck, abdomen/pelvis, or to the included remainder of the skeleton. 6. Other imaging findings of potential clinical significance: Chronic right maxillary sinusitis. Aortic Atherosclerosis (ICD10-I70.0) and Emphysema (ICD10-J43.9). Chronic calcific pancreatitis.     01/15/2018 Initial Diagnosis    Squamous cell carcinoma of right lung ([place])    01/15/2018 Initial Biopsy    Lung needle core biopsy RUL: Squamous cell carcinoma    02/19/2018 Procedure    Port-a-cath placement by Dr. Arnoldo Morale     02/20/2018 -  Chemotherapy    The patient had palonosetron (ALOXI) injection 0.25 mg, 0.25 mg, Intravenous,  Once, 1 of 4 cycles Administration: 0.25 mg (02/20/2018) CARBOplatin (PARAPLATIN) 230 mg in sodium chloride 0.9 % 250 mL chemo infusion, 230 mg (100 % of original dose 225.8 mg), Intravenous,  Once, 1 of 4  cycles Dose modification:   (original dose 225.8 mg, Cycle 1) PACLitaxel (TAXOL) 78 mg in dextrose 5 % 250 mL chemo infusion (</= 80mg /m2), 45 mg/m2 = 78 mg, Intravenous,  Once, 1 of 4 cycles Administration: 78 mg (02/20/2018)  for chemotherapy treatment.     05/06/2018 - 10/03/2018 Chemotherapy    The patient had durvalumab (IMFINZI) 620 mg in sodium chloride 0.9 % 100 mL chemo infusion, 10 mg/kg = 620 mg, Intravenous,  Once, 10 of 12 cycles Administration: 620 mg (05/11/2018), 620 mg (05/25/2018), 620 mg (06/08/2018), 620 mg (06/22/2018), 620 mg (07/06/2018), 620 mg (07/20/2018), 620 mg (08/08/2018), 620 mg (08/22/2018), 620 mg (09/05/2018), 620 mg (09/19/2018)  for chemotherapy treatment.     10/19/2018 -  Chemotherapy    The patient had palonosetron (ALOXI) injection 0.25 mg, 0.25 mg, Intravenous,  Once, 1 of 4 cycles Administration: 0.25 mg (10/19/2018) ondansetron (ZOFRAN) 8 mg in sodium chloride 0.9 % 50 mL IVPB, 8 mg (100 % of original dose 8 mg), Intravenous,  Once, 1 of 4 cycles Dose modification: 8 mg (original dose 8 mg, Cycle 1) CARBOplatin (PARAPLATIN) 500 mg in sodium chloride 0.9 % 250 mL chemo infusion, 500 mg (100 % of original dose 501 mg), Intravenous,  Once, 1 of 4 cycles Dose modification:   (original dose 501 mg, Cycle 1) Administration: 500 mg (10/19/2018) gemcitabine (GEMZAR) 1,634 mg in sodium chloride 0.9 % 250 mL chemo infusion, 1,000 mg/m2 = 1,634 mg, Intravenous,  Once, 1 of 4 cycles Administration: 1,634 mg (10/19/2018)  for chemotherapy treatment.      Adenocarcinoma of prostate ([place])   10/24/2015 Tumor Marker    PSA 1.53  01/06/2016 Tumor Marker    PSA 1.6    07/31/2017 Tumor Marker    PSA 4.6    01/17/2018 Initial Diagnosis    Adenocarcinoma of prostate ([place])    01/17/2018 Procedure    12-core prostate biopsy revealed 6/12 cores positive for prostate adenocarcinoma ([facility]).  (L) base lat: Gleason 9 (5+4), 20%. (L) mid lat: Gleason 9  (4+5), 40%. (L) apex lat: Gleason 9 (4+5), 20%. (L) base: Gleason 9 (5+4), 20%. (L) mid: Gleason 10 (5+5), 5%. (L) apex: Gleason 9 (5+4), 9%.        INTERVAL HISTORY:  Fred Hernandez 62 y.o. male returns for routine follow-up for squamous cell carcinoma of the lung. He is still having pain throughout the day and night. The pain wakes him up at night and he is taking it around the clock. It is only lasting 2 hours instead of 3 and wants to take it more frequently. He has occasional dizziness when he stands too fast. Denies any nausea, vomiting, or diarrhea. Had not noticed any recent bleeding such as epistaxis, hematuria or hematochezia. Denies recent chest pain on exertion, shortness of breath on minimal exertion, pre-syncopal episodes, or palpitations. Denies any numbness or tingling in hands or feet. Denies any recent fevers, infections, or recent hospitalizations. He reports having no appetite or energy level at this time.    REVIEW OF SYSTEMS:  Review of Systems  HENT:   Positive for trouble swallowing.   Neurological: Positive for dizziness.  All other systems reviewed and are negative.    PAST MEDICAL/SURGICAL HISTORY:  Past Medical History:  Diagnosis Date  . Bursitis   . CAD (coronary artery disease)     s/p stent placement unknown artery 12/2014,  . [facility])    prostate cancer  . COPD (chronic obstructive pulmonary disease) ([place])   . Diabetes mellitus ([place])   . GERD (gastroesophageal reflux disease)   . History of prostate cancer   . Hyperlipemia   . Hypertension   . Peripheral arterial disease ([place])    a. h/o stents. b. 03/2016  - successful PTA and covered stenting using overlapping Viabahn covered stents of a long segment in-stent restenosis of previously placed nitinol self expanding stents in the proximal mid and distal right SFA.  . Tobacco abuse    Past Surgical History:  Procedure Laterality Date  . COLONOSCOPY WITH PROPOFOL N/A 11/22/2016   Procedure: COLONOSCOPY  WITH PROPOFOL;  Surgeon: [name], MD;  [facility];  Service: Endoscopy;  Laterality: N/A;  10:00 am  . CORONARY STENT PLACEMENT  12-2014   [facility] [employer]  . FEMORAL ARTERY STENT Bilateral 03/2013  . PERIPHERAL VASCULAR CATHETERIZATION N/A 04/04/2016   Procedure: Lower Extremity Angiography;  Surgeon: Lorretta Harp, MD;  Location: [place] [facility] LAB;  Service: Cardiovascular;  Laterality: N/A;  . PERIPHERAL VASCULAR CATHETERIZATION N/A 04/04/2016   Procedure: Abdominal Aortogram;  Surgeon: Lorretta Harp, MD;  Location: [place] [facility] LAB;  Service: Cardiovascular;  Laterality: N/A;  . PERIPHERAL VASCULAR CATHETERIZATION  04/04/2016   Procedure: Peripheral Vascular Intervention;  Surgeon: Lorretta Harp, MD;  Location: [place] [facility];  Service: Cardiovascular;;  rt SFA stent  . POLYPECTOMY  11/22/2016   Procedure: POLYPECTOMY;  Surgeon: [name], MD;  [facility];  Service: Endoscopy;;  descending colon polyp, transverse colon polyp, sigmoid colon polyp, rectal polyp  . PORTACATH PLACEMENT Left 02/19/2018   Procedure: INSERTION PORT-A-CATH;  Surgeon: Arnoldo Morale,  Elta Guadeloupe, MD;  Location: [facility] OR[facility];  Service: General;  Laterality: Left;  . STENT PLACEMENT VASCULAR ([facility])  2013     SOCIAL HISTORY:  Social History   Socioeconomic History  . Marital status: Single    Spouse name: Not on file  . Number of children: Not on file  . Years of education: Not on file  . Highest education level: Not on file  Occupational History  . Not on file  Social Needs  . Financial resource strain: Not on file  . Food insecurity:    Worry: Not on file    Inability: Not on file  . Transportation needs:    Medical: Not on file    Non-medical: Not on file  Tobacco Use  . Smoking status: Current Every Day Smoker    Packs/day: 0.25    Years: 45.00    Pack years: 11.25    Types: Cigarettes    Start date: 02/12/1971  . Smokeless tobacco: Never Used    Substance and Sexual Activity  . Alcohol use: Not Currently    Alcohol/week: 0.0 standard drinks    Comment: stopped 2 weeks ago.  . Drug use: No  . Sexual activity: Yes  Lifestyle  . Physical activity:    Days per week: Not on file    Minutes per session: Not on file  . Stress: Not on file  Relationships  . Social connections:    Talks on phone: Not on file    Gets together: Not on file    Attends religious service: Not on file    Active member of club or organization: Not on file    Attends meetings of clubs or organizations: Not on file    Relationship status: Not on file  . Intimate partner violence:    Fear of current or ex partner: Not on file    Emotionally abused: Not on file    Physically abused: Not on file    Forced sexual activity: Not on file  Other Topics Concern  . Not on file  Social History Narrative  . Not on file    FAMILY HISTORY:  Family History  Problem Relation Age of Onset  . Diabetes Mother   . Heart disease Mother   . Hypertension Mother   . Healthy Brother   . Other Brother        accident   . Arthritis Sister   . Arthritis Sister   . SIDS Brother   . Colon cancer Neg Hx     CURRENT MEDICATIONS:  Outpatient Encounter Medications as of 10/19/2018  Medication Sig  . aspirin 81 MG chewable tablet Chew 81 mg by mouth daily.  [name] atorvastatin (LIPITOR) 80 MG tablet Take 80 mg by mouth at bedtime.   [name] CARBOPLATIN IV Inject into the vein every 21 ( twenty-one) days.   . cilostazol (PLETAL) 50 MG tablet TAKE 1 TABLET(50 MG) BY MOUTH TWICE DAILY  . Coenzyme Q10 100 MG capsule Take 100 mg by mouth at bedtime.   . Gemcitabine HCl (GEMZAR IV) Inject into the vein. Day 1 day 8 every 21 days.  [name] HYDROmorphone (DILAUDID) 2 MG tablet Take 2 tablets (4 mg total) by mouth every 2 (two) hours as needed for severe pain.  [name] ipratropium-albuterol (DUONEB) 0.5-2.5 (3) MG/3ML SOLN Take 3 mLs by nebulization every 6 (six) hours as needed (for shortness of  breath).  . lidocaine-prilocaine (EMLA) cream Apply a quarter size amount to affected area 1 hour prior  to coming to chemotherapy.  Do not rub in.  Cover with plastic wrap.  . metFORMIN (GLUCOPHAGE) 500 MG tablet Take 500 mg by mouth every other day.   . methocarbamol (ROBAXIN) 500 MG tablet Take 500 mg by mouth every 6 (six) hours as needed.   . metoprolol succinate (TOPROL-XL) 50 MG 24 hr tablet Take 50 mg by mouth at bedtime.   . Omega-3 Fatty Acids (FISH OIL) 1000 MG CAPS Take 1 capsule by mouth 2 (two) times daily.   . ondansetron (ZOFRAN) 8 MG tablet Take 1 tablet (8 mg total) by mouth every 8 (eight) hours as needed for nausea or vomiting.  . pantoprazole (PROTONIX) 40 MG tablet Take 40 mg by mouth 2 (two) times daily.  . [DISCONTINUED] HYDROmorphone (DILAUDID) 2 MG tablet Take 2 tablets (4 mg total) by mouth every 3 (three) hours as needed for severe pain.  . [DISCONTINUED] nystatin (MYCOSTATIN) 100000 UNIT/ML suspension Take 5 mLs by mouth 4 (four) times daily.   . Diphenhyd-Hydrocort-Nystatin (FIRST-DUKES MOUTHWASH MT) Take by mouth.  . dronabinol (MARINOL) 5 MG capsule Take 1 capsule (5 mg total) by mouth 2 (two) times daily before a meal.  . FLOVENT HFA 110 MCG/ACT inhaler Inhale 1 puff into the lungs 2 (two) times daily.  . fluticasone (FLONASE) 50 MCG/ACT nasal spray Place 1 spray into both nostrils daily.  . hydrOXYzine (ATARAX/VISTARIL) 50 MG tablet Take 50 mg by mouth 3 (three) times daily as needed.   . prochlorperazine (COMPAZINE) 10 MG tablet Take 1 tablet (10 mg total) by mouth every 6 (six) hours as needed for nausea or vomiting.  . [DISCONTINUED] HYDROcodone-acetaminophen (NORCO) 7.5-325 MG tablet One tablet as needed twice a day for pain. (Patient not taking: Reported on 10/04/2018)  . [DISCONTINUED] megestrol (MEGACE) 20 MG tablet Take by mouth.  . [DISCONTINUED] oxyCODONE-acetaminophen (PERCOCET) 10-325 MG tablet Take 1 tablet by mouth every 6 (six) hours as needed for  pain. (Patient not taking: Reported on 10/04/2018)  . [DISCONTINUED] PACLitaxel (TAXOL IV) Inject into the vein. 6 weekly treatments with radiation  . [DISCONTINUED] triamcinolone cream (KENALOG) 0.1 % Apply topically 2 (two) times daily.   No facility-administered encounter medications on file as of 10/19/2018.     ALLERGIES:  Allergies  Allergen Reactions  . Lisinopril Anaphylaxis  . Penicillins Anaphylaxis    Has patient had a PCN reaction causing immediate rash, facial/tongue/throat swelling, SOB or lightheadedness with hypotension:Yes Has patient had a PCN reaction causing severe rash involving mucus membranes or skin necrosis:unsure Has patient had a PCN reaction that required hospitalization:unsure Has patient had a PCN reaction occurring within the last 10 years:~10 years per patient If all of the above answers are "NO", then may proceed with Cephalosporin use.   . Ace Inhibitors Swelling     PHYSICAL EXAM:  ECOG Performance status: 1  Vitals:   10/19/18 1004  BP: 120/62  Pulse: (!) 125  Resp: 20  Temp: 97.7 F (36.5 C)  SpO2: 97%   Filed Weights   10/19/18 1004  Weight: 119 lb 6.4 oz (54.2 kg)    Physical Exam Constitutional:      Appearance: Normal appearance. He is normal weight.  Musculoskeletal: Normal range of motion.  Skin:    General: Skin is warm and dry.  Neurological:     Mental Status: He is alert and oriented to person, place, and time. Mental status is at baseline.  Psychiatric:        Mood and Affect: Mood normal.  Behavior: Behavior normal.        Thought Content: Thought content normal.        Judgment: Judgment normal.      LABORATORY DATA:  I have reviewed the labs as listed.  CBC    Component Value Date/Time   WBC 20.0 (H) 10/19/2018 0931   RBC 3.92 (L) 10/19/2018 0931   HGB 10.3 (L) 10/19/2018 0931   HCT 34.1 (L) 10/19/2018 0931   PLT 758 (H) 10/19/2018 0931   MCV 87.0 10/19/2018 0931   MCH 26.3 10/19/2018 0931    MCHC 30.2 10/19/2018 0931   RDW 15.0 10/19/2018 0931   LYMPHSABS 0.9 10/19/2018 0931   MONOABS 1.5 (H) 10/19/2018 0931   EOSABS 0.2 10/19/2018 0931   BASOSABS 0.1 10/19/2018 0931   CMP Latest Ref Rng & Units 10/19/2018 10/04/2018 09/19/2018  Glucose 70 - 99 mg/dL 132(H) 134(H) 128(H)  BUN 8 - 23 mg/dL 8 11 9   Creatinine 0.61 - 1.24 mg/dL 0.50(L) 0.61 0.59(L)  Sodium 135 - 145 mmol/L 135 133(L) 138  Potassium 3.5 - 5.1 mmol/L 4.9 3.4(L) 4.6  Chloride 98 - 111 mmol/L 94(L) 97(L) 105  CO2 22 - 32 mmol/L 28 24 21(L)  Calcium 8.9 - 10.3 mg/dL 10.3 10.7(H) 10.0  Total Protein 6.5 - 8.1 g/dL 8.0 8.1 7.8  Total Bilirubin 0.3 - 1.2 mg/dL 0.4 0.2(L) 0.4  Alkaline Phos 38 - 126 U/L 90 80 94  AST 15 - 41 U/L 19 15 17   ALT 0 - 44 U/L 13 13 16        DIAGNOSTIC IMAGING:  I have independently reviewed the scans and discussed with the patient.   I have reviewed Francene Finders, NP's note and agree with the documentation.  I personally performed a face-to-face visit, made revisions and my assessment and plan is as follows.    ASSESSMENT & PLAN:   Squamous cell carcinoma of right lung ([place]) 1.  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 -Patient evaluated by Dr. Roxan Hockey and was recommended to have preoperative chemoradiation therapy. -Chemoradiation therapy with carboplatin and paclitaxel from 02/20/2018 through 04/04/2018. -I have reviewed the results of the CT scan of the chest dated 04/27/2018 which showed regression of the tumor which is sub-solid in appearance.  No new areas were seen. - He was started on consolidation Durvalumab on 05/03/2018.  He is tolerating it very well. -We discussed the results of the CT scan of the chest with contrast dated 07/31/2018 which shows slight interval worsening of destruction of the posterolateral right second and third ribs.  There was  also mention of slight worsening by few millimeters of the right apical lung mass. - I have discussed the findings on the PET CT scan dated 08/30/2018 showing peripheral right apical tumor with rib and chest wall invasion, progressive compared to prior PET scan, with medial competence and increased chest wall invasion.  SUV max is similar.  Right paratracheal/mediastinal lymph node demonstrates increased hypermetabolism. - He received 1 dose of palliative radiation on 10/16/2018.  He did not notice any improvement in the pain. -We talked about starting him on chemotherapy with carboplatin AUC 5 on day 1 and gemcitabine thousand milligrams per meter square on day 1 and 8 every 21 days.  Hopefully this will help palliate his pain also.  We talked about side effects in detail.  We reviewed his labs.  He  may proceed with his first treatment today. - We will see him back in 3 weeks for follow-up.  2.  Weight loss: He is continuing to lose weight secondary to his active cancer and intractable pain. - We have initiated chemotherapy today and hopefully it helps.  3.  Recurrent prostate cancer: He had a history of prostate cancer and reportedly received radiation therapy 6-7 years ago.  He had prostate biopsies on 01/17/2018 which showed recurrence with a Gleason score (5+5)=10.  He will be treated with GnRH agonist.  4.  Cancer related pain: -He has right-sided scapular and back pain.  He is taking Dilaudid 4 mg every 3 hours.  He states Dilaudid is helping but it is not lasting more than 2 hours. - I will increase Dilaudid to 4 mg every 2 hours.      Orders placed this encounter:  Orders Placed This Encounter  Procedures  . TSH  . CBC with Differential/Platelet  . Comprehensive metabolic panel      Fred Jack, MD [place] [phone]

## 2018-10-19 NOTE — Progress Notes (Signed)
Nutrition Follow-up:  Patient with lung cancer and noted disease progression per MD note.   Noted that patient has received more radiation due to pain.  Planning to start palliative chemotherapy of carboplatin and gemcitabine today.    Met with patient and wife today.  Patient reports no appetite and having trouble swallowing.  Wife reports that they have talked to PCP about this and planning to talk with Dr. Raliegh Ip. Wife reports they told us it was the tumor pressing on his esophagus causing his trouble swallowing. Patient reports eating some eggs and sausage for breakfast.  Lunch is blackeyed peas and collard greens.  Has been trying to drink ensure and mixing with milk because too thick.  Does not really eat much meat anymore unless it is chopped up well.    Patient reports biggest issue is pain effecting appetite.    Medications: reviewed  Labs: glucose 132  Anthropometrics:   Weight decreased to 119 lb 6.4 oz today from 130 lb on 11/20.   141 lb noted on 07/06/2018  16% weight loss in the last 3 months, significant  NUTRITION DIAGNOSIS: severe malnutrition continues   MALNUTRITION DIAGNOSIS: severe malnutrition continues   INTERVENTION:  Additional case of ensure enlive given to patient today.   Discussed strategies to change the consistency of foods to make them easier to swallowing.  Patient will discussed with MD today.  Reviewed strategies to increase calories and protein.      MONITORING, EVALUATION, GOAL: weight trends, intake   NEXT VISIT: Jan 10 during infusion  Kaijah Abts B. Zenia Resides, [place], [place] Registered Dietitian [phone] (pager)

## 2018-10-19 NOTE — Patient Instructions (Signed)
[facility] Discharge Instructions for Patients Receiving Chemotherapy  Today you received the following chemotherapy agents   To help prevent nausea and vomiting after your treatment, we encourage you to take your nausea medication   If you develop nausea and vomiting that is not controlled by your nausea medication, call the clinic.   BELOW ARE SYMPTOMS THAT SHOULD BE REPORTED IMMEDIATELY:  *FEVER GREATER THAN 100.5 F  *CHILLS WITH OR WITHOUT FEVER  NAUSEA AND VOMITING THAT IS NOT CONTROLLED WITH YOUR NAUSEA MEDICATION  *UNUSUAL SHORTNESS OF BREATH  *UNUSUAL BRUISING OR BLEEDING  TENDERNESS IN MOUTH AND THROAT WITH OR WITHOUT PRESENCE OF ULCERS  *URINARY PROBLEMS  *BOWEL PROBLEMS  UNUSUAL RASH Items with * indicate a potential emergency and should be followed up as soon as possible.  Feel free to call the clinic should you have any questions or concerns. The clinic phone number is [phone].  Please show the CHEMO ALERT CARD at check-in to the Emergency Department and triage nurse.   

## 2018-10-19 NOTE — Progress Notes (Signed)
Treatment given today per MD orders. Tolerated infusion without adverse affects. Vital signs stable. No complaints at this time. Discharged from clinic ambulatory. F/U with [facility] as scheduled. Reported VS after treatment 100.3 and recheck 99.6 to Dr. Delton Coombes. Proceed with treatment and perform pt teaching pertaining to fevers over the weekend.

## 2018-10-22 ENCOUNTER — Other Ambulatory Visit (HOSPITAL_COMMUNITY): Payer: Self-pay | Admitting: *Deleted

## 2018-10-22 MED ORDER — SUCRALFATE 1 GM/10ML PO SUSP: ORAL | 2 refills | Status: DC

## 2018-10-22 NOTE — Telephone Encounter (Signed)
Patient's fiance, [name], called clinic. Patient is c/o throat hurting when he swallows.  He denies any sores in his mouth or throat, no raw areas, tongue is not swollen, he denies any shortness of breath. He denies any nausea or acid reflux. Patient states it only hurts when he swallows.  Standing orders for carafate/viscous lidocaine called in to patient's pharmacy. Patient is aware. He was advised to give our clinic a call if the pain gets worse or if he develops any additional symptoms. They verbalize understanding.

## 2018-10-26 ENCOUNTER — Other Ambulatory Visit (HOSPITAL_COMMUNITY): Payer: Self-pay | Admitting: *Deleted

## 2018-10-26 ENCOUNTER — Other Ambulatory Visit: Payer: Self-pay

## 2018-10-26 ENCOUNTER — Inpatient Hospital Stay (HOSPITAL_COMMUNITY): Payer: Medicare Other

## 2018-10-26 ENCOUNTER — Inpatient Hospital Stay (HOSPITAL_COMMUNITY): Payer: Medicare Other | Attending: Internal Medicine

## 2018-10-26 ENCOUNTER — Encounter (HOSPITAL_COMMUNITY): Payer: Self-pay

## 2018-10-26 VITALS — BP 106/75 | HR 103 | Temp 98.3°F | Resp 16 | Wt 114.9 lb

## 2018-10-26 DIAGNOSIS — C3411 Malignant neoplasm of upper lobe, right bronchus or lung: Secondary | ICD-10-CM | POA: Insufficient documentation

## 2018-10-26 DIAGNOSIS — C3491 Malignant neoplasm of unspecified part of right bronchus or lung: Secondary | ICD-10-CM

## 2018-10-26 DIAGNOSIS — Z5111 Encounter for antineoplastic chemotherapy: Secondary | ICD-10-CM | POA: Diagnosis present

## 2018-10-26 LAB — COMPREHENSIVE METABOLIC PANEL
ALT: 22 U/L (ref 0–44)
AST: 32 U/L (ref 15–41)
Albumin: 3.2 g/dL — ABNORMAL LOW (ref 3.5–5.0)
Alkaline Phosphatase: 74 U/L (ref 38–126)
Anion gap: 16 — ABNORMAL HIGH (ref 5–15)
BUN: 9 mg/dL (ref 8–23)
CALCIUM: 10.2 mg/dL (ref 8.9–10.3)
CO2: 24 mmol/L (ref 22–32)
Chloride: 93 mmol/L — ABNORMAL LOW (ref 98–111)
Creatinine, Ser: 0.69 mg/dL (ref 0.61–1.24)
GFR calc non Af Amer: >60 mL/min (ref >60)
Glucose, Bld: 140 mg/dL — ABNORMAL HIGH (ref 70–99)
Potassium: 4.8 mmol/L (ref 3.5–5.1)
Sodium: 133 mmol/L — ABNORMAL LOW (ref 135–145)
Total Bilirubin: 0.5 mg/dL (ref 0.3–1.2)
Total Protein: 8.4 g/dL — ABNORMAL HIGH (ref 6.5–8.1)

## 2018-10-26 LAB — CBC WITH DIFFERENTIAL/PLATELET
Abs Immature Granulocytes: 0.1 10*3/uL — ABNORMAL HIGH (ref 0.00–0.07)
Basophils Absolute: 0.1 10*3/uL (ref 0.0–0.1)
Basophils Relative: 1 %
Eosinophils Absolute: 0.1 10*3/uL (ref 0.0–0.5)
Eosinophils Relative: 1 %
HCT: 34 % — ABNORMAL LOW (ref 39.0–52.0)
Hemoglobin: 10.4 g/dL — ABNORMAL LOW (ref 13.0–17.0)
Immature Granulocytes: 1 %
Lymphocytes Relative: 10 %
Lymphs Abs: 1.2 10*3/uL (ref 0.7–4.0)
MCH: 26.3 pg (ref 26.0–34.0)
MCHC: 30.6 g/dL (ref 30.0–36.0)
MCV: 86.1 fL (ref 80.0–100.0)
MONO ABS: 0.9 10*3/uL (ref 0.1–1.0)
Monocytes Relative: 8 %
Neutro Abs: 9.6 10*3/uL — ABNORMAL HIGH (ref 1.7–7.7)
Neutrophils Relative %: 79 %
Platelets: 382 10*3/uL (ref 150–400)
RBC: 3.95 MIL/uL — ABNORMAL LOW (ref 4.22–5.81)
RDW: 14.7 % (ref 11.5–15.5)
WBC: 11.9 10*3/uL — ABNORMAL HIGH (ref 4.0–10.5)
nRBC: 0 % (ref 0.0–0.2)

## 2018-10-26 LAB — MAGNESIUM: Magnesium: 1.3 mg/dL — ABNORMAL LOW (ref 1.7–2.4)

## 2018-10-26 MED ORDER — SODIUM CHLORIDE 0.9 % IV SOLN
Freq: Once | INTRAVENOUS | Status: AC
  Administered 2018-10-26: 12:00:00 via INTRAVENOUS

## 2018-10-26 MED ORDER — HEPARIN SOD (PORK) LOCK FLUSH 100 UNIT/ML IV SOLN
500.0000 [IU] | Freq: Once | INTRAVENOUS | Status: AC | PRN
  Administered 2018-10-26: 500 [IU]
  Filled 2018-10-26: qty 5

## 2018-10-26 MED ORDER — LORAZEPAM 2 MG/ML IJ SOLN
1.0000 mg | Freq: Once | INTRAMUSCULAR | Status: AC
  Administered 2018-10-26: 1 mg via INTRAVENOUS
  Filled 2018-10-26: qty 1

## 2018-10-26 MED ORDER — SODIUM CHLORIDE 0.9 % IV SOLN
8.0000 mg | Freq: Once | INTRAVENOUS | Status: AC
  Administered 2018-10-26: 8 mg via INTRAVENOUS
  Filled 2018-10-26: qty 4

## 2018-10-26 MED ORDER — SODIUM CHLORIDE 0.9 % IV SOLN
1000.0000 mg/m2 | Freq: Once | INTRAVENOUS | Status: AC
  Administered 2018-10-26: 1634 mg via INTRAVENOUS
  Filled 2018-10-26: qty 27.2

## 2018-10-26 MED ORDER — SODIUM CHLORIDE 0.9% FLUSH
10.0000 mL | INTRAVENOUS | Status: DC | PRN
  Administered 2018-10-26: 10 mL
  Filled 2018-10-26: qty 10

## 2018-10-26 NOTE — Progress Notes (Signed)
Patient presented today for day 8 of treatment. Patient complaining of nausea and vomiting for 2 days. Pt. Reports taking zofran and compazine. Reinforced on how to take those medications as prescribed. Labs stable, reviewed with Dr. Walden Field . Will give ativan for nausea per MD and proceed with treatment today.   Treatment given per orders. Patient tolerated it well without problems. Vitals stable and discharged home from clinic ambulatory. Follow up as scheduled.

## 2018-10-26 NOTE — Patient Instructions (Signed)
[facility] Discharge Instructions for Patients Receiving Chemotherapy   Beginning January 23rd 2017 lab work for the Cancer Center will be done in the  Main lab at [facility] on 1st floor. If you have a lab appointment with the Cancer Center please come in thru the  Main Entrance and check in at the main information desk   Today you received the following chemotherapy agents   To help prevent nausea and vomiting after your treatment, we encourage you to take your nausea medication     If you develop nausea and vomiting, or diarrhea that is not controlled by your medication, call the clinic.  The clinic phone number is [phone]. Office hours are Monday-Friday 8:30am-5:00pm.  BELOW ARE SYMPTOMS THAT SHOULD BE REPORTED IMMEDIATELY:  *FEVER GREATER THAN 101.0 F  *CHILLS WITH OR WITHOUT FEVER  NAUSEA AND VOMITING THAT IS NOT CONTROLLED WITH YOUR NAUSEA MEDICATION  *UNUSUAL SHORTNESS OF BREATH  *UNUSUAL BRUISING OR BLEEDING  TENDERNESS IN MOUTH AND THROAT WITH OR WITHOUT PRESENCE OF ULCERS  *URINARY PROBLEMS  *BOWEL PROBLEMS  UNUSUAL RASH Items with * indicate a potential emergency and should be followed up as soon as possible. If you have an emergency after office hours please contact your primary care physician or go to the nearest emergency department.  Please call the clinic during office hours if you have any questions or concerns.   You may also contact the Patient Navigator at [phone] should you have any questions or need assistance in obtaining follow up care.      Resources For Cancer Patients and their Caregivers ? American Cancer Society: Can assist with transportation, wigs, general needs, runs Look Good Feel Better.        1-888-227-6333 ? Cancer Care: Provides financial assistance, online support groups, medication/co-pay assistance.  1-800-813-HOPE (4673) ? [facility] Assists [place] cancer  patients and their families through emotional , educational and financial support.  336-427-4357 ? [place] DSS Where to apply for food stamps, Medicaid and utility assistance. 336-342-1394 ? [facility]: Transportation to medical appointments. 336-347-2287 ? Social Security Administration: May apply for disability if have a Stage IV cancer. 336-342-7796 1-800-772-1213 ? [place] Aging, Disability and Transit Services: Assists with nutrition, care and transit needs. 336-349-2343         

## 2018-10-26 NOTE — Progress Notes (Signed)
Patient's fiance called stating that Fred Hernandez has been nauseated, uncontrolled diarrhea, fatigued, not eating anything.  She states that he has been taking his mouth wash for his sore throat but it hasn't helped much. He has been taking the compazine and imodium without relief of nausea/diarrhea. She states that he hasn't been eating because everything he does try to eat goes straight through him.  She is concerned about his weight.    I advised her that we would do lab work today and would treat him while he is here for his scheduled appointment.  She verbalizes understanding and is aware of appointment time.

## 2018-10-30 ENCOUNTER — Encounter (HOSPITAL_COMMUNITY): Payer: Self-pay | Admitting: *Deleted

## 2018-10-30 NOTE — Progress Notes (Signed)
Patient's significant other called and said that he is drinking fluids but is unable to eat. He doesn't like taking the marinol bc it makes him feel high and laugh a lot.  He is still drinking fluids but isn't eating, they want to know what he can do to help his appetite.    I spoke with Lala Lund our NP and she stated "He has an appointment with [place] in 10 days, tell him to continue eating and drinking as much as he can and start drinking boost or ensure and we will talk to him at his appointment"  I called back to patient's home and spoke with his significant other and relayed the message.  She states that he is drinking boost about 3-4 daily.  I encouraged her to have him keep drinking them and take in as much food as he can.  I advised her to call our office if she needs anything further. She voices her appreciation and understanding.

## 2018-11-01 ENCOUNTER — Other Ambulatory Visit (HOSPITAL_COMMUNITY): Payer: Self-pay | Admitting: *Deleted

## 2018-11-01 ENCOUNTER — Telehealth (HOSPITAL_COMMUNITY): Payer: Self-pay | Admitting: *Deleted

## 2018-11-01 DIAGNOSIS — C3491 Malignant neoplasm of unspecified part of right bronchus or lung: Secondary | ICD-10-CM

## 2018-11-01 MED ORDER — HYDROMORPHONE HCL 2 MG PO TABS: 4.0000 mg | ORAL_TABLET | ORAL | 0 refills | Status: DC | PRN

## 2018-11-09 ENCOUNTER — Inpatient Hospital Stay (HOSPITAL_COMMUNITY): Payer: Medicare Other

## 2018-11-09 ENCOUNTER — Ambulatory Visit (HOSPITAL_COMMUNITY): Payer: Medicare Other | Admitting: Hematology

## 2018-11-09 ENCOUNTER — Encounter (HOSPITAL_COMMUNITY): Payer: Self-pay

## 2018-11-09 ENCOUNTER — Inpatient Hospital Stay (HOSPITAL_COMMUNITY): Payer: Medicare Other | Attending: Hematology

## 2018-11-09 ENCOUNTER — Other Ambulatory Visit (HOSPITAL_COMMUNITY): Payer: Medicare Other

## 2018-11-09 VITALS — BP 115/65 | HR 108 | Temp 98.7°F | Resp 18 | Wt 109.6 lb

## 2018-11-09 DIAGNOSIS — K861 Other chronic pancreatitis: Secondary | ICD-10-CM | POA: Diagnosis not present

## 2018-11-09 DIAGNOSIS — C3411 Malignant neoplasm of upper lobe, right bronchus or lung: Secondary | ICD-10-CM | POA: Insufficient documentation

## 2018-11-09 DIAGNOSIS — F17210 Nicotine dependence, cigarettes, uncomplicated: Secondary | ICD-10-CM | POA: Insufficient documentation

## 2018-11-09 DIAGNOSIS — J449 Chronic obstructive pulmonary disease, unspecified: Secondary | ICD-10-CM | POA: Insufficient documentation

## 2018-11-09 DIAGNOSIS — Z7984 Long term (current) use of oral hypoglycemic drugs: Secondary | ICD-10-CM | POA: Diagnosis not present

## 2018-11-09 DIAGNOSIS — C3491 Malignant neoplasm of unspecified part of right bronchus or lung: Secondary | ICD-10-CM

## 2018-11-09 DIAGNOSIS — E119 Type 2 diabetes mellitus without complications: Secondary | ICD-10-CM | POA: Diagnosis not present

## 2018-11-09 DIAGNOSIS — Z79899 Other long term (current) drug therapy: Secondary | ICD-10-CM | POA: Diagnosis not present

## 2018-11-09 DIAGNOSIS — I700 Atherosclerosis of aorta: Secondary | ICD-10-CM | POA: Insufficient documentation

## 2018-11-09 DIAGNOSIS — E785 Hyperlipidemia, unspecified: Secondary | ICD-10-CM | POA: Diagnosis not present

## 2018-11-09 DIAGNOSIS — I2510 Atherosclerotic heart disease of native coronary artery without angina pectoris: Secondary | ICD-10-CM | POA: Insufficient documentation

## 2018-11-09 DIAGNOSIS — I10 Essential (primary) hypertension: Secondary | ICD-10-CM | POA: Insufficient documentation

## 2018-11-09 DIAGNOSIS — Z7982 Long term (current) use of aspirin: Secondary | ICD-10-CM | POA: Diagnosis not present

## 2018-11-09 DIAGNOSIS — K859 Acute pancreatitis without necrosis or infection, unspecified: Secondary | ICD-10-CM | POA: Insufficient documentation

## 2018-11-09 DIAGNOSIS — I739 Peripheral vascular disease, unspecified: Secondary | ICD-10-CM | POA: Insufficient documentation

## 2018-11-09 DIAGNOSIS — R634 Abnormal weight loss: Secondary | ICD-10-CM | POA: Insufficient documentation

## 2018-11-09 DIAGNOSIS — K219 Gastro-esophageal reflux disease without esophagitis: Secondary | ICD-10-CM | POA: Insufficient documentation

## 2018-11-09 DIAGNOSIS — G893 Neoplasm related pain (acute) (chronic): Secondary | ICD-10-CM | POA: Diagnosis not present

## 2018-11-09 LAB — COMPREHENSIVE METABOLIC PANEL
ALK PHOS: 88 U/L (ref 38–126)
ALT: 21 U/L (ref 0–44)
AST: 19 U/L (ref 15–41)
Albumin: 3 g/dL — ABNORMAL LOW (ref 3.5–5.0)
Anion gap: 11 (ref 5–15)
BUN: 7 mg/dL — ABNORMAL LOW (ref 8–23)
CO2: 27 mmol/L (ref 22–32)
Calcium: 9.6 mg/dL (ref 8.9–10.3)
Chloride: 94 mmol/L — ABNORMAL LOW (ref 98–111)
Creatinine, Ser: 0.4 mg/dL — ABNORMAL LOW (ref 0.61–1.24)
GFR calc Af Amer: >60 mL/min (ref >60)
GFR calc non Af Amer: >60 mL/min (ref >60)
Glucose, Bld: 137 mg/dL — ABNORMAL HIGH (ref 70–99)
Potassium: 4.3 mmol/L (ref 3.5–5.1)
SODIUM: 132 mmol/L — AB (ref 135–145)
Total Bilirubin: 0.3 mg/dL (ref 0.3–1.2)
Total Protein: 7.6 g/dL (ref 6.5–8.1)

## 2018-11-09 LAB — CBC WITH DIFFERENTIAL/PLATELET
Abs Immature Granulocytes: 0.04 10*3/uL (ref 0.00–0.07)
Basophils Absolute: 0 10*3/uL (ref 0.0–0.1)
Basophils Relative: 1 %
Eosinophils Absolute: 0.2 10*3/uL (ref 0.0–0.5)
Eosinophils Relative: 2 %
HCT: 27.8 % — ABNORMAL LOW (ref 39.0–52.0)
HEMOGLOBIN: 8.6 g/dL — AB (ref 13.0–17.0)
Immature Granulocytes: 1 %
Lymphocytes Relative: 10 %
Lymphs Abs: 0.8 10*3/uL (ref 0.7–4.0)
MCH: 26.4 pg (ref 26.0–34.0)
MCHC: 30.9 g/dL (ref 30.0–36.0)
MCV: 85.3 fL (ref 80.0–100.0)
Monocytes Absolute: 1.2 10*3/uL — ABNORMAL HIGH (ref 0.1–1.0)
Monocytes Relative: 15 %
Neutro Abs: 5.8 10*3/uL (ref 1.7–7.7)
Neutrophils Relative %: 71 %
Platelets: 307 10*3/uL (ref 150–400)
RBC: 3.26 MIL/uL — ABNORMAL LOW (ref 4.22–5.81)
RDW: 15.6 % — ABNORMAL HIGH (ref 11.5–15.5)
WBC: 8.1 10*3/uL (ref 4.0–10.5)
nRBC: 0 % (ref 0.0–0.2)

## 2018-11-09 LAB — TSH: TSH: 2.602 u[IU]/mL (ref 0.350–4.500)

## 2018-11-09 MED ORDER — SODIUM CHLORIDE 0.9 % IV SOLN
501.0000 mg | Freq: Once | INTRAVENOUS | Status: AC
  Administered 2018-11-09: 500 mg via INTRAVENOUS
  Filled 2018-11-09: qty 50

## 2018-11-09 MED ORDER — SODIUM CHLORIDE 0.9% FLUSH
10.0000 mL | INTRAVENOUS | Status: DC | PRN
  Administered 2018-11-09: 10 mL
  Filled 2018-11-09: qty 10

## 2018-11-09 MED ORDER — SODIUM CHLORIDE 0.9 % IV SOLN
10.0000 mg | Freq: Once | INTRAVENOUS | Status: AC
  Administered 2018-11-09: 10 mg via INTRAVENOUS
  Filled 2018-11-09: qty 1

## 2018-11-09 MED ORDER — SODIUM CHLORIDE 0.9 % IV SOLN
Freq: Once | INTRAVENOUS | Status: AC
  Administered 2018-11-09: 11:00:00 via INTRAVENOUS

## 2018-11-09 MED ORDER — PALONOSETRON HCL INJECTION 0.25 MG/5ML
0.2500 mg | Freq: Once | INTRAVENOUS | Status: AC
  Administered 2018-11-09: 0.25 mg via INTRAVENOUS
  Filled 2018-11-09: qty 5

## 2018-11-09 MED ORDER — SODIUM CHLORIDE 0.9 % IV SOLN
1000.0000 mg/m2 | Freq: Once | INTRAVENOUS | Status: AC
  Administered 2018-11-09: 1634 mg via INTRAVENOUS
  Filled 2018-11-09: qty 43

## 2018-11-09 MED ORDER — HEPARIN SOD (PORK) LOCK FLUSH 100 UNIT/ML IV SOLN
500.0000 [IU] | Freq: Once | INTRAVENOUS | Status: AC | PRN
  Administered 2018-11-09: 500 [IU]

## 2018-11-09 NOTE — Progress Notes (Signed)
Nutrition Follow-up:  Patient with lung cancer noted disease progression.  Patient receiving palliative chemotherapy.   Met with patient today, significant other not with patient today.  Patient reports appetite is better.  Ate liver with gravy, squash, cornbread and mashed potatoes for breakfast.  Noted issues with nausea, vomiting, poor po intake on 12/31 and 12/27.  Patient reports he has some nausea.  Not sure if he is taking nausea medication or not.  "I don't know what was going on a few weeks ago.  "My body was just going through some changes."  "I am eating better now."   Reports he is drinking ensure sometimes 2 per day or more.    Noted can't take marinol.    Medications: reviewed  Labs: Na 132, glucose 137, BUN 7, creatinine 0.40  Anthropometrics:   Weight decreased to 109 lb 9.6 oz today from 119 lb 6.4 oz on 12/20  141 lb on 07/06/2018  NUTRITION DIAGNOSIS: severe malnutrition, continues   MALNUTRITION DIAGNOSIS: severe malnutrition continues   INTERVENTION:  Additional case of ensure enlive given to patient today. Reviewed ways to increase calories and protein in current eating pattern Reviewed importance of taking nausea medication if having symptoms of nausea.      MONITORING, EVALUATION, GOAL: weight trends, intake   NEXT VISIT: Jan 17 during infusion  Joli B. Allen, RD, LDN Registered Dietitian [phone] (pager)     

## 2018-11-09 NOTE — Progress Notes (Deleted)
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 There is early bony invasion of the right second rib and cortical destruction indicating local rib/chest wall invasion by tumor. 2. There is additional pleural thickening and pleural calcification along the right posterior pleural surface which is not separately hypermetabolic. 3. Small mediastinal lymph nodes are not appreciably hypermetabolic. 4. Multifocal and some diffuse regions of high activity in the bowel are likely physiologic. 5. No findings of metastatic disease to the neck, abdomen/pelvis, or to the included remainder of the skeleton. 6. Other imaging findings of potential clinical significance: Chronic right maxillary sinusitis. Aortic Atherosclerosis (ICD10-I70.0) and Emphysema (ICD10-J43.9). Chronic calcific pancreatitis.     01/15/2018 Initial Diagnosis    Squamous cell carcinoma of right lung ([place])    01/15/2018 Initial Biopsy    Lung needle core biopsy RUL: Squamous cell carcinoma    02/19/2018 Procedure    Port-a-cath placement by Dr. Arnoldo Morale     02/20/2018 -  Chemotherapy    The patient had palonosetron (ALOXI) injection 0.25 mg, 0.25 mg, Intravenous,  Once, 1 of 4 cycles Administration: 0.25 mg (02/20/2018) CARBOplatin (PARAPLATIN) 230 mg in sodium chloride 0.9 % 250 mL chemo infusion, 230 mg (100 % of original dose 225.8 mg), Intravenous,  Once, 1 of 4  cycles Dose modification:   (original dose 225.8 mg, Cycle 1) PACLitaxel (TAXOL) 78 mg in dextrose 5 % 250 mL chemo infusion (</= 80mg /m2), 45 mg/m2 = 78 mg, Intravenous,  Once, 1 of 4 cycles Administration: 78 mg (02/20/2018)  for chemotherapy treatment.     05/06/2018 - 10/03/2018 Chemotherapy    The patient had durvalumab (IMFINZI) 620 mg in sodium chloride 0.9 % 100 mL chemo infusion, 10 mg/kg = 620 mg, Intravenous,  Once, 10 of 12 cycles Administration: 620 mg (05/11/2018), 620 mg (05/25/2018), 620 mg (06/08/2018), 620 mg (06/22/2018), 620 mg (07/06/2018), 620 mg (07/20/2018), 620 mg (08/08/2018), 620 mg (08/22/2018), 620 mg (09/05/2018), 620 mg (09/19/2018)  for chemotherapy treatment.     10/19/2018 -  Chemotherapy    The patient had palonosetron (ALOXI) injection 0.25 mg, 0.25 mg, Intravenous,  Once, 1 of 4 cycles Administration: 0.25 mg (10/19/2018) ondansetron (ZOFRAN) 8 mg in sodium chloride 0.9 % 50 mL IVPB, 8 mg (100 % of original dose 8 mg), Intravenous,  Once, 1 of 4 cycles Dose modification: 8 mg (original dose 8 mg, Cycle 1) Administration: 8 mg (10/26/2018) CARBOplatin (PARAPLATIN) 500 mg in sodium chloride 0.9 % 250 mL chemo infusion, 500 mg (100 % of original dose 501 mg), Intravenous,  Once, 1 of 4 cycles Dose modification:   (original dose 501 mg, Cycle 1) Administration: 500 mg (10/19/2018) gemcitabine (GEMZAR) 1,634 mg in sodium chloride 0.9 % 250 mL chemo infusion, 1,000 mg/m2 = 1,634 mg, Intravenous,  Once, 1 of 4 cycles Administration: 1,634 mg (10/19/2018), 1,634 mg (10/26/2018)  for chemotherapy treatment.      Adenocarcinoma of prostate ([place])   10/24/2015 Tumor Marker  PSA 1.53    01/06/2016 Tumor Marker    PSA 1.6    07/31/2017 Tumor Marker    PSA 4.6    01/17/2018 Initial Diagnosis    Adenocarcinoma of prostate ([place])    01/17/2018 Procedure    12-core prostate biopsy revealed 6/12 cores positive for prostate adenocarcinoma (Dr. [facility]).  (L)  base lat: Gleason 9 (5+4), 20%. (L) mid lat: Gleason 9 (4+5), 40%. (L) apex lat: Gleason 9 (4+5), 20%. (L) base: Gleason 9 (5+4), 20%. (L) mid: Gleason 10 (5+5), 5%. (L) apex: Gleason 9 (5+4), 9%.        CANCER STAGING: Cancer Staging No matching staging information was found for the patient.   INTERVAL HISTORY:  Mr. Fred Hernandez 63 y.o. male returns for routine follow-up and consideration for next cycle of chemotherapy.   Due for cycle #*** of *** today.   Overall, he tells me he has been feeling pretty well. Energy levels ***; appetite ***.   Overall, he feels ready for next cycle of chemo today.     REVIEW OF SYSTEMS:  Review of Systems - Oncology   PAST MEDICAL/SURGICAL HISTORY:  Past Medical History:  Diagnosis Date  . Bursitis   . CAD (coronary artery disease)     s/p stent placement unknown artery 12/2014,  . Cancer [facility])    prostate cancer  . COPD (chronic obstructive pulmonary disease) ([place])   . Diabetes mellitus ([place])   . GERD (gastroesophageal reflux disease)   . History of prostate cancer   . Hyperlipemia   . Hypertension   . Peripheral arterial disease ([place])    a. h/o stents. b. 03/2016  - successful PTA and covered stenting using overlapping Viabahn covered stents of a long segment in-stent restenosis of previously placed nitinol self expanding stents in the proximal mid and distal right SFA.  . Tobacco abuse    Past Surgical History:  Procedure Laterality Date  . COLONOSCOPY WITH PROPOFOL N/A 11/22/2016   Procedure: COLONOSCOPY WITH PROPOFOL;  Surgeon: [name], MD;  Location: [facility];  Service: Endoscopy;  Laterality: N/A;  10:00 am  . CORONARY STENT PLACEMENT  12-2014   [facility] [employer]  . FEMORAL ARTERY STENT Bilateral 03/2013  . PERIPHERAL VASCULAR CATHETERIZATION N/A 04/04/2016   Procedure: Lower Extremity Angiography;  Surgeon: Lorretta Harp, MD;  Location: [place] CV LAB;  Service: Cardiovascular;  Laterality: N/A;  .  PERIPHERAL VASCULAR CATHETERIZATION N/A 04/04/2016   Procedure: Abdominal Aortogram;  Surgeon: Lorretta Harp, MD;  Location: [place] CV LAB;  Service: Cardiovascular;  Laterality: N/A;  . PERIPHERAL VASCULAR CATHETERIZATION  04/04/2016   Procedure: Peripheral Vascular Intervention;  Surgeon: Lorretta Harp, MD;  Location: [place] [facility] LAB;  Service: Cardiovascular;;  rt SFA stent  . POLYPECTOMY  11/22/2016   Procedure: POLYPECTOMY;  Surgeon: [name], MD;  Location[facility];  Service: Endoscopy;;  descending colon polyp, transverse colon polyp, sigmoid colon polyp, rectal polyp  . PORTACATH PLACEMENT Left 02/19/2018   Procedure: INSERTION PORT-A-CATH;  Surgeon: Aviva Signs, MD;  Location: [facility] ORS;  Service: General;  Laterality: Left;  . STENT PLACEMENT VASCULAR ([facility])  2013     SOCIAL HISTORY:  Social History   Socioeconomic History  . Marital status: Single    Spouse name: Not on file  . Number of children: Not on file  . Years of education: Not on file  . Highest education level: Not on file  Occupational History  .  Not on file  Social Needs  . Financial resource strain: Not on file  . Food insecurity:    Worry: Not on file    Inability: Not on file  . Transportation needs:    Medical: Not on file    Non-medical: Not on file  Tobacco Use  . Smoking status: Current Every Day Smoker    Packs/day: 0.25    Years: 45.00    Pack years: 11.25    Types: Cigarettes    Start date: 02/12/1971  . Smokeless tobacco: Never Used  Substance and Sexual Activity  . Alcohol use: Not Currently    Alcohol/week: 0.0 standard drinks    Comment: stopped 2 weeks ago.  . Drug use: No  . Sexual activity: Yes  Lifestyle  . Physical activity:    Days per week: Not on file    Minutes per session: Not on file  . Stress: Not on file  Relationships  . Social connections:    Talks on phone: Not on file    Gets together: Not on file    Attends religious service: Not on file      Active member of club or organization: Not on file    Attends meetings of clubs or organizations: Not on file    Relationship status: Not on file  . Intimate partner violence:    Fear of current or ex partner: Not on file    Emotionally abused: Not on file    Physically abused: Not on file    Forced sexual activity: Not on file  Other Topics Concern  . Not on file  Social History Narrative  . Not on file    FAMILY HISTORY:  Family History  Problem Relation Age of Onset  . Diabetes Mother   . Heart disease Mother   . Hypertension Mother   . Healthy Brother   . Other Brother        accident   . Arthritis Sister   . Arthritis Sister   . SIDS Brother   . Colon cancer Neg Hx     CURRENT MEDICATIONS:  Outpatient Encounter Medications as of 11/09/2018  Medication Sig  . aspirin 81 MG chewable tablet Chew 81 mg by mouth daily.  [name] atorvastatin (LIPITOR) 80 MG tablet Take 80 mg by mouth at bedtime.   [name] CARBOPLATIN IV Inject into the vein every 21 ( twenty-one) days.   . cilostazol (PLETAL) 50 MG tablet TAKE 1 TABLET(50 MG) BY MOUTH TWICE DAILY  . Coenzyme Q10 100 MG capsule Take 100 mg by mouth at bedtime.   . Diphenhyd-Hydrocort-Nystatin (FIRST-DUKES MOUTHWASH MT) Take by mouth.  . dronabinol (MARINOL) 5 MG capsule Take 1 capsule (5 mg total) by mouth 2 (two) times daily before a meal.  . FLOVENT HFA 110 MCG/ACT inhaler Inhale 1 puff into the lungs 2 (two) times daily.  . fluticasone (FLONASE) 50 MCG/ACT nasal spray Place 1 spray into both nostrils daily.  . Gemcitabine HCl (GEMZAR IV) Inject into the vein. Day 1 day 8 every 21 days.  [name] HYDROmorphone (DILAUDID) 2 MG tablet Take 2 tablets (4 mg total) by mouth every 2 (two) hours as needed for severe pain.  . hydrOXYzine (ATARAX/VISTARIL) 50 MG tablet Take 50 mg by mouth 3 (three) times daily as needed.   [name] ipratropium-albuterol (DUONEB) 0.5-2.5 (3) MG/3ML SOLN Take 3 mLs by nebulization every 6 (six) hours as needed (for  shortness of breath).  . lidocaine-prilocaine (EMLA) cream Apply a quarter size amount  to affected area 1 hour prior to coming to chemotherapy.  Do not rub in.  Cover with plastic wrap.  . metFORMIN (GLUCOPHAGE) 500 MG tablet Take 500 mg by mouth every other day.   . methocarbamol (ROBAXIN) 500 MG tablet Take 500 mg by mouth every 6 (six) hours as needed.   . metoprolol succinate (TOPROL-XL) 50 MG 24 hr tablet Take 50 mg by mouth at bedtime.   . Omega-3 Fatty Acids (FISH OIL) 1000 MG CAPS Take 1 capsule by mouth 2 (two) times daily.   . ondansetron (ZOFRAN) 8 MG tablet Take 1 tablet (8 mg total) by mouth every 8 (eight) hours as needed for nausea or vomiting.  . pantoprazole (PROTONIX) 40 MG tablet Take 40 mg by mouth 2 (two) times daily.  . prochlorperazine (COMPAZINE) 10 MG tablet Take 1 tablet (10 mg total) by mouth every 6 (six) hours as needed for nausea or vomiting.  . sucralfate (CARAFATE) 1 GM/10ML suspension Carafate 1 gm / 68ml and viscous lidocaine 2% 1:1.  Swish & swallow 76ml four times daily.   No facility-administered encounter medications on file as of 11/09/2018.     ALLERGIES:  Allergies  Allergen Reactions  . Lisinopril Anaphylaxis  . Penicillins Anaphylaxis    Has patient had a PCN reaction causing immediate rash, facial/tongue/throat swelling, SOB or lightheadedness with hypotension:Yes Has patient had a PCN reaction causing severe rash involving mucus membranes or skin necrosis:unsure Has patient had a PCN reaction that required hospitalization:unsure Has patient had a PCN reaction occurring within the last 10 years:~10 years per patient If all of the above answers are "NO", then may proceed with Cephalosporin use.   . Ace Inhibitors Swelling     PHYSICAL EXAM:  ECOG Performance status: ***  There were no vitals filed for this visit. There were no vitals filed for this visit.  Physical Exam   LABORATORY DATA:  I have reviewed the labs as listed.  CBC      Component Value Date/Time   WBC 11.9 (H) 10/26/2018 1050   RBC 3.95 (L) 10/26/2018 1050   HGB 10.4 (L) 10/26/2018 1050   HCT 34.0 (L) 10/26/2018 1050   PLT 382 10/26/2018 1050   MCV 86.1 10/26/2018 1050   MCH 26.3 10/26/2018 1050   MCHC 30.6 10/26/2018 1050   RDW 14.7 10/26/2018 1050   LYMPHSABS 1.2 10/26/2018 1050   MONOABS 0.9 10/26/2018 1050   EOSABS 0.1 10/26/2018 1050   BASOSABS 0.1 10/26/2018 1050   CMP Latest Ref Rng & Units 10/26/2018 10/19/2018 10/04/2018  Glucose 70 - 99 mg/dL 140(H) 132(H) 134(H)  BUN 8 - 23 mg/dL 9 8 11   Creatinine 0.61 - 1.24 mg/dL 0.69 0.50(L) 0.61  Sodium 135 - 145 mmol/L 133(L) 135 133(L)  Potassium 3.5 - 5.1 mmol/L 4.8 4.9 3.4(L)  Chloride 98 - 111 mmol/L 93(L) 94(L) 97(L)  CO2 22 - 32 mmol/L 24 28 24   Calcium 8.9 - 10.3 mg/dL 10.2 10.3 10.7(H)  Total Protein 6.5 - 8.1 g/dL 8.4(H) 8.0 8.1  Total Bilirubin 0.3 - 1.2 mg/dL 0.5 0.4 0.2(L)  Alkaline Phos 38 - 126 U/L 74 90 80  AST 15 - 41 U/L 32 19 15  ALT 0 - 44 U/L 22 13 13        DIAGNOSTIC IMAGING:  I have independently reviewed the scans and discussed with the patient.   I have reviewed Francene Finders, NP's note and agree with the documentation.  I personally performed a face-to-face visit, made revisions  and my assessment and plan is as follows.    ASSESSMENT & PLAN:   No problem-specific Assessment & Plan notes found for this encounter.      Orders placed this encounter:  No orders of the defined types were placed in this encounter.     Derek Jack, MD [place] [phone]

## 2018-11-09 NOTE — Patient Instructions (Signed)
[facility] Discharge Instructions for Patients Receiving Chemotherapy   Beginning January 23rd 2017 lab work for the [facility] will be done in the  Main lab at [facility] on 1st floor. If you have a lab appointment with the [place] please come in thru the  Main Entrance and check in at the main information desk   Today you received the following chemotherapy agents Gemzar and Carboplatin. Follow-up as scheduled. Call clinic for any questions or concerns  To help prevent nausea and vomiting after your treatment, we encourage you to take your nausea medication   If you develop nausea and vomiting, or diarrhea that is not controlled by your medication, call the clinic.  The clinic phone number is [phone]. Office hours are Monday-Friday 8:30am-5:00pm.  BELOW ARE SYMPTOMS THAT SHOULD BE REPORTED IMMEDIATELY:  *FEVER GREATER THAN 101.0 F  *CHILLS WITH OR WITHOUT FEVER  NAUSEA AND VOMITING THAT IS NOT CONTROLLED WITH YOUR NAUSEA MEDICATION  *UNUSUAL SHORTNESS OF BREATH  *UNUSUAL BRUISING OR BLEEDING  TENDERNESS IN MOUTH AND THROAT WITH OR WITHOUT PRESENCE OF ULCERS  *URINARY PROBLEMS  *BOWEL PROBLEMS  UNUSUAL RASH Items with * indicate a potential emergency and should be followed up as soon as possible. If you have an emergency after office hours please contact your primary care physician or go to the nearest emergency department.  Please call the clinic during office hours if you have any questions or concerns.   You may also contact the Patient Navigator at [phone] should you have any questions or need assistance in obtaining follow up care.      Resources For Cancer Patients and their Caregivers ? American Cancer Society: Can assist with transportation, wigs, general needs, runs Look Good Feel Better.        772-806-0937 ? Cancer Care: Provides financial assistance, online support groups, medication/co-pay assistance.   1-800-813-HOPE (847) 337-4868) ? [facility] Assists [place] cancer patients and their families through emotional , educational and financial support.  309-213-1096 ? [place] DSS Where to apply for food stamps, Medicaid and utility assistance. 713-671-2154 ? [facility]: Transportation to medical appointments. 215-357-1563 ? Social Security Administration: May apply for disability if have a Stage IV cancer. 306-116-7055 [phone] ? [employer], Disability and Transit Services: Assists with nutrition, care and transit needs. 6092119870

## 2018-11-09 NOTE — Progress Notes (Signed)
1025 Labs and recent weight loss of 4 lbs reviewed with Dr.Katragadda and pt approved for chemo tx today per MD                                              Fred Hernandez tolerated chemo tx well without complaints or incident. VSS upon discharge. Pt discharged self ambulatory in satisfactory condition

## 2018-11-12 ENCOUNTER — Encounter (HOSPITAL_COMMUNITY): Payer: Self-pay | Admitting: *Deleted

## 2018-11-12 ENCOUNTER — Other Ambulatory Visit (HOSPITAL_COMMUNITY): Payer: Self-pay | Admitting: Nurse Practitioner

## 2018-11-12 DIAGNOSIS — C3491 Malignant neoplasm of unspecified part of right bronchus or lung: Secondary | ICD-10-CM

## 2018-11-12 MED ORDER — FENTANYL 50 MCG/HR TD PT72: 50.0000 ug | MEDICATED_PATCH | TRANSDERMAL | 0 refills | Status: DC

## 2018-11-13 ENCOUNTER — Encounter (HOSPITAL_COMMUNITY): Payer: Self-pay

## 2018-11-13 ENCOUNTER — Other Ambulatory Visit: Payer: Self-pay

## 2018-11-13 ENCOUNTER — Emergency Department (HOSPITAL_COMMUNITY)
Admission: EM | Admit: 2018-11-13 | Discharge: 2018-11-13 | Disposition: A | Discharge status: Home / Self Care | Payer: Medicare Other | Attending: Emergency Medicine | Admitting: Emergency Medicine

## 2018-11-13 ENCOUNTER — Encounter (HOSPITAL_COMMUNITY): Payer: Self-pay | Admitting: *Deleted

## 2018-11-13 DIAGNOSIS — Z85118 Personal history of other malignant neoplasm of bronchus and lung: Secondary | ICD-10-CM | POA: Insufficient documentation

## 2018-11-13 DIAGNOSIS — F17210 Nicotine dependence, cigarettes, uncomplicated: Secondary | ICD-10-CM | POA: Diagnosis not present

## 2018-11-13 DIAGNOSIS — Z79899 Other long term (current) drug therapy: Secondary | ICD-10-CM | POA: Diagnosis not present

## 2018-11-13 DIAGNOSIS — I2510 Atherosclerotic heart disease of native coronary artery without angina pectoris: Secondary | ICD-10-CM | POA: Insufficient documentation

## 2018-11-13 DIAGNOSIS — M546 Pain in thoracic spine: Secondary | ICD-10-CM | POA: Diagnosis present

## 2018-11-13 DIAGNOSIS — J449 Chronic obstructive pulmonary disease, unspecified: Secondary | ICD-10-CM | POA: Diagnosis not present

## 2018-11-13 DIAGNOSIS — R0789 Other chest pain: Secondary | ICD-10-CM | POA: Diagnosis not present

## 2018-11-13 DIAGNOSIS — Z8546 Personal history of malignant neoplasm of prostate: Secondary | ICD-10-CM | POA: Diagnosis not present

## 2018-11-13 DIAGNOSIS — E119 Type 2 diabetes mellitus without complications: Secondary | ICD-10-CM | POA: Diagnosis not present

## 2018-11-13 DIAGNOSIS — Z7984 Long term (current) use of oral hypoglycemic drugs: Secondary | ICD-10-CM | POA: Diagnosis not present

## 2018-11-13 DIAGNOSIS — I10 Essential (primary) hypertension: Secondary | ICD-10-CM | POA: Diagnosis not present

## 2018-11-13 DIAGNOSIS — Z7982 Long term (current) use of aspirin: Secondary | ICD-10-CM | POA: Diagnosis not present

## 2018-11-13 MED ORDER — HYDROMORPHONE HCL 1 MG/ML IJ SOLN
1.0000 mg | Freq: Once | INTRAMUSCULAR | Status: AC
  Administered 2018-11-13: 1 mg via INTRAMUSCULAR
  Filled 2018-11-13: qty 1

## 2018-11-13 MED ORDER — LORAZEPAM 1 MG PO TABS
1.0000 mg | ORAL_TABLET | Freq: Once | ORAL | Status: AC
  Administered 2018-11-13: 1 mg via ORAL
  Filled 2018-11-13: qty 1

## 2018-11-13 MED ORDER — KETOROLAC TROMETHAMINE 30 MG/ML IJ SOLN
12.0000 mg | Freq: Once | INTRAMUSCULAR | Status: AC
  Administered 2018-11-13: 12 mg via INTRAMUSCULAR
  Filled 2018-11-13: qty 1

## 2018-11-13 NOTE — ED Triage Notes (Signed)
Pt is complaining of back pain. Has cancer and was just started on fentanyl patches for pain. Pain is getting worse. Pain 10/10

## 2018-11-13 NOTE — Progress Notes (Signed)
Patient's wife called today asking for help. She said that Fred Hernandez has had excruciating pain over the night, it has been increasing in intensity.  Per orders yesterday if the pain medication we called in did not help the pain, patient was to report to the ER.  I told his wife to take him there.   I called the ER and spoke with Magda Paganini, RN charge and gave report.

## 2018-11-13 NOTE — Discharge Instructions (Signed)
The pain in your right upper back is in the area where your cancer has spread into your chest wall.  Continue to take Dilaudid and use the fentanyl patches have as prescribed.  With the pain is very severe, you can take 400 mg of ibuprofen every 8 hours as needed.

## 2018-11-13 NOTE — ED Provider Notes (Signed)
[facility] Provider Note   CSN: [id number] Arrival date & time: 11/13/18  1057     History   Chief Complaint Chief Complaint  Patient presents with  . Back Pain    HPI Fred Hernandez is a 63 y.o. male.  HPI  63 year old male with right upper back pain.  Chronic in nature.  Known lung cancer with chest wall invasion and destruction of the posterior/lateral right second and third ribs.  He reports that his pain is been very well controlled with p.o. Dilaudid and now fentanyl patches.  The pain is constant.  Worse with direct pressure over the area and certain movements.  No acute respiratory complaints.  No acute neurologic complaints.   Past Medical History:  Diagnosis Date  . Bursitis   . CAD (coronary artery disease)     s/p stent placement unknown artery 12/2014,  . Cancer [facility])    prostate cancer  . COPD (chronic obstructive pulmonary disease) ([place])   . Diabetes mellitus ([place])   . GERD (gastroesophageal reflux disease)   . History of prostate cancer   . Hyperlipemia   . Hypertension   . Peripheral arterial disease ([place])    a. h/o stents. b. 03/2016  - successful PTA and covered stenting using overlapping Viabahn covered stents of a long segment in-stent restenosis of previously placed nitinol self expanding stents in the proximal mid and distal right SFA.  . Tobacco abuse     Patient Active Problem List   Diagnosis Date Noted  . Goals of care, counseling/discussion 10/04/2018  . History of prostate cancer 06/26/2018  . 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 Adenocarcinoma of prostate ([place]) 01/17/2018  . History of colonic polyps 09/20/2016  . Essential hypertension 04/05/2016  . Hyperlipidemia 04/05/2016  . 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 Tobacco abuse 04/05/2016  . Claudication ([place]) 04/04/2016  . Peripheral arterial disease ([place]) 03/16/2016  . Left shoulder pain 12/23/2015    Past  Surgical History:  Procedure Laterality Date  . COLONOSCOPY WITH PROPOFOL N/A 11/22/2016   Procedure: COLONOSCOPY WITH PROPOFOL;  Surgeon: [name], MD;  Location: [facility];  Service: Endoscopy;  Laterality: N/A;  10:00 am  . CORONARY STENT PLACEMENT  12-2014   [facility] [employer]  . FEMORAL ARTERY STENT Bilateral 03/2013  . PERIPHERAL VASCULAR CATHETERIZATION N/A 04/04/2016   Procedure: Lower Extremity Angiography;  Surgeon: Lorretta Harp, MD;  Location: [place] CV LAB;  Service: Cardiovascular;  Laterality: N/A;  . PERIPHERAL VASCULAR CATHETERIZATION N/A 04/04/2016   Procedure: Abdominal Aortogram;  Surgeon: Lorretta Harp, MD;  Location: [place] CV LAB;  Service: Cardiovascular;  Laterality: N/A;  . PERIPHERAL VASCULAR CATHETERIZATION  04/04/2016   Procedure: Peripheral Vascular Intervention;  Surgeon: Lorretta Harp, MD;  Location: [place] CV LAB;  Service: Cardiovascular;;  rt SFA stent  . POLYPECTOMY  11/22/2016   Procedure: POLYPECTOMY;  Surgeon: [name], MD;  Location[facility];  Service: Endoscopy;;  descending colon polyp, transverse colon polyp, sigmoid colon polyp, rectal polyp  . PORTACATH PLACEMENT Left 02/19/2018   Procedure: INSERTION PORT-A-CATH;  Surgeon: Aviva Signs, MD;  Location: [facility] ORS;  Service: General;  Laterality: Left;  . STENT PLACEMENT VASCULAR ([place] [facility])  2013        Home Medications    Prior to Admission medications   Medication Sig Start Date End Date Taking? Authorizing Provider  aspirin 81 MG chewable  tablet Chew 81 mg by mouth daily.    [provider]  atorvastatin (LIPITOR) 80 MG tablet Take 80 mg by mouth at bedtime.     [provider]  CARBOPLATIN IV Inject into the vein every 21 ( twenty-one) days.     [provider]  cilostazol (PLETAL) 50 MG tablet TAKE 1 TABLET(50 MG) BY MOUTH TWICE DAILY 08/09/18   Lorretta Harp, MD  Coenzyme Q10 100 MG capsule Take 100 mg by  mouth at bedtime.     [provider]  Diphenhyd-Hydrocort-Nystatin (FIRST-DUKES MOUTHWASH MT) Take by mouth. 03/12/18   [provider]  dronabinol (MARINOL) 5 MG capsule Take 1 capsule (5 mg total) by mouth 2 (two) times daily before a meal. 02/27/18   Derek Jack, MD  fentaNYL (DURAGESIC - DOSED MCG/HR) 50 MCG/HR Place 1 patch (50 mcg total) onto the skin every 3 (three) days. 11/12/18   Lockamy, Randi L, NP-C  FLOVENT HFA 110 MCG/ACT inhaler Inhale 1 puff into the lungs 2 (two) times daily. 11/02/16   [provider]  fluticasone (FLONASE) 50 MCG/ACT nasal spray Place 1 spray into both nostrils daily.    [provider]  Gemcitabine HCl (GEMZAR IV) Inject into the vein. Day 1 day 8 every 21 days.    [provider]  HYDROmorphone (DILAUDID) 2 MG tablet Take 2 tablets (4 mg total) by mouth every 2 (two) hours as needed for severe pain. 11/01/18   Lockamy, Randi L, NP-C  hydrOXYzine (ATARAX/VISTARIL) 50 MG tablet Take 50 mg by mouth 3 (three) times daily as needed.  04/16/18   [provider]  ipratropium-albuterol (DUONEB) 0.5-2.5 (3) MG/3ML SOLN Take 3 mLs by nebulization every 6 (six) hours as needed (for shortness of breath).    [provider]  lidocaine-prilocaine (EMLA) cream Apply a quarter size amount to affected area 1 hour prior to coming to chemotherapy.  Do not rub in.  Cover with plastic wrap. 01/30/18   Derek Jack, MD  metFORMIN (GLUCOPHAGE) 500 MG tablet Take 500 mg by mouth every other day.     [provider]  methocarbamol (ROBAXIN) 500 MG tablet Take 500 mg by mouth every 6 (six) hours as needed.  07/23/18   [provider]  metoprolol succinate (TOPROL-XL) 50 MG 24 hr tablet Take 50 mg by mouth at bedtime.  02/08/16   [provider]  Omega-3 Fatty Acids (FISH OIL) 1000 MG CAPS Take 1 capsule by mouth 2 (two) times daily.     [provider]  ondansetron (ZOFRAN) 8 MG tablet  Take 1 tablet (8 mg total) by mouth every 8 (eight) hours as needed for nausea or vomiting. 01/30/18   Derek Jack, MD  pantoprazole (PROTONIX) 40 MG tablet Take 40 mg by mouth 2 (two) times daily.    [provider]  prochlorperazine (COMPAZINE) 10 MG tablet Take 1 tablet (10 mg total) by mouth every 6 (six) hours as needed for nausea or vomiting. 01/30/18   Derek Jack, MD  sucralfate (CARAFATE) 1 GM/10ML suspension Carafate 1 gm / 40ml and viscous lidocaine 2% 1:1.  Swish & swallow 54ml four times daily. 10/22/18   Derek Jack, MD    Family History Family History  Problem Relation Age of Onset  . Diabetes Mother   . Heart disease Mother   . Hypertension Mother   . Healthy Brother   . Other Brother        accident   . Arthritis Sister   .  Arthritis Sister   . SIDS Brother   . Colon cancer Neg Hx     Social History Social History   Tobacco Use  . Smoking status: Current Every Day Smoker    Packs/day: 0.25    Years: 45.00    Pack years: 11.25    Types: Cigarettes    Start date: 02/12/1971  . Smokeless tobacco: Never Used  Substance Use Topics  . Alcohol use: Not Currently    Alcohol/week: 0.0 standard drinks    Comment: stopped 2 weeks ago.  . Drug use: No     Allergies   Lisinopril; Penicillins; and Ace inhibitors   Review of Systems Review of Systems All systems reviewed and negative, other than as noted in HPI.  Physical Exam Updated Vital Signs BP (!) 143/92 (BP Location: Right Arm)   Pulse (!) 121   Temp 98.2 F (36.8 C) (Oral)   Resp 12   SpO2 98%   Physical Exam Vitals signs and nursing note reviewed.  Constitutional:      General: He is not in acute distress.    Appearance: He is well-developed.  HENT:     Head: Normocephalic and atraumatic.  Eyes:     General:        Right eye: No discharge.        Left eye: No discharge.     Conjunctiva/sclera: Conjunctivae normal.  Neck:     Musculoskeletal: Neck supple.    Cardiovascular:     Rate and Rhythm: Normal rate and regular rhythm.     Heart sounds: Normal heart sounds. No murmur. No friction rub. No gallop.   Pulmonary:     Effort: Pulmonary effort is normal. No respiratory distress.     Breath sounds: Normal breath sounds.  Abdominal:     General: There is no distension.     Palpations: Abdomen is soft.     Tenderness: There is no abdominal tenderness.  Musculoskeletal:        General: Tenderness present.     Comments: Tenderness to palpation in the right upper back in the region of the right scapula.  No midline spinal tenderness.  No acute distress at rest.  Mildly tachycardic.  Known cancer, but I do not appreciate any adventitious breath sounds.  Skin:    General: Skin is warm and dry.  Neurological:     Mental Status: He is alert.  Psychiatric:        Behavior: Behavior normal.        Thought Content: Thought content normal.      ED Treatments / Results  Labs (all labs ordered are listed, but only abnormal results are displayed) Labs Reviewed - No data to display  EKG None  Radiology No results found.  Procedures Procedures (including critical care time)  Medications Ordered in ED Medications  ketorolac (TORADOL) 30 MG/ML injection 12 mg (has no administration in time range)  HYDROmorphone (DILAUDID) injection 1 mg (has no administration in time range)  LORazepam (ATIVAN) tablet 1 mg (has no administration in time range)     Initial Impression / Assessment and Plan / ED Course  I have reviewed the triage vital signs and the nursing notes.  Pertinent labs & imaging results that were available during my care of the patient were reviewed by me and considered in my medical decision making (see chart for details).     63 year old male with chronic but worsening right upper back pain.  His symptoms correlate to area  of known tumor invasion into his chest wall.  He is already on Dilaudid and fentanyl patches.  His renal  function is okay.  I thinkit is reasonable to take NSAIDs sparingly as needed for more severe pain.  Titration of controlled medication needs to be by his oncologist or other single provider. Dosed medications in the ED to try and get him more comfortable. Tachycardia noted, but may be pain related. He has no acute respiratory or other complaints.   Final Clinical Impressions(s) / ED Diagnoses   Final diagnoses:  Right-sided chest wall pain    ED Discharge Orders    None       Virgel Manifold, MD 11/15/18 [phone]

## 2018-11-13 NOTE — ED Notes (Signed)
Pt and visitor belongings placed in a bag and taped due to bed bugs.

## 2018-11-15 ENCOUNTER — Other Ambulatory Visit (HOSPITAL_COMMUNITY): Payer: Self-pay | Admitting: *Deleted

## 2018-11-15 DIAGNOSIS — C3491 Malignant neoplasm of unspecified part of right bronchus or lung: Secondary | ICD-10-CM

## 2018-11-15 MED ORDER — HYDROMORPHONE HCL 2 MG PO TABS: 4.0000 mg | ORAL_TABLET | ORAL | 0 refills | Status: DC | PRN

## 2018-11-16 ENCOUNTER — Encounter (HOSPITAL_COMMUNITY): Payer: Self-pay | Admitting: Hematology

## 2018-11-16 ENCOUNTER — Inpatient Hospital Stay (HOSPITAL_COMMUNITY): Payer: Medicare Other

## 2018-11-16 ENCOUNTER — Encounter (HOSPITAL_COMMUNITY): Payer: Self-pay

## 2018-11-16 ENCOUNTER — Inpatient Hospital Stay (HOSPITAL_BASED_OUTPATIENT_CLINIC_OR_DEPARTMENT_OTHER): Payer: Medicare Other | Admitting: Hematology

## 2018-11-16 VITALS — BP 89/63 | HR 110 | Temp 97.6°F | Resp 18 | Wt 104.6 lb

## 2018-11-16 DIAGNOSIS — I700 Atherosclerosis of aorta: Secondary | ICD-10-CM | POA: Diagnosis not present

## 2018-11-16 DIAGNOSIS — F17210 Nicotine dependence, cigarettes, uncomplicated: Secondary | ICD-10-CM

## 2018-11-16 DIAGNOSIS — R634 Abnormal weight loss: Secondary | ICD-10-CM

## 2018-11-16 DIAGNOSIS — E785 Hyperlipidemia, unspecified: Secondary | ICD-10-CM

## 2018-11-16 DIAGNOSIS — C3491 Malignant neoplasm of unspecified part of right bronchus or lung: Secondary | ICD-10-CM

## 2018-11-16 DIAGNOSIS — K859 Acute pancreatitis without necrosis or infection, unspecified: Secondary | ICD-10-CM

## 2018-11-16 DIAGNOSIS — Z79899 Other long term (current) drug therapy: Secondary | ICD-10-CM

## 2018-11-16 DIAGNOSIS — I2510 Atherosclerotic heart disease of native coronary artery without angina pectoris: Secondary | ICD-10-CM

## 2018-11-16 DIAGNOSIS — I10 Essential (primary) hypertension: Secondary | ICD-10-CM

## 2018-11-16 DIAGNOSIS — G893 Neoplasm related pain (acute) (chronic): Secondary | ICD-10-CM | POA: Diagnosis not present

## 2018-11-16 DIAGNOSIS — I739 Peripheral vascular disease, unspecified: Secondary | ICD-10-CM

## 2018-11-16 DIAGNOSIS — E119 Type 2 diabetes mellitus without complications: Secondary | ICD-10-CM

## 2018-11-16 DIAGNOSIS — C3411 Malignant neoplasm of upper lobe, right bronchus or lung: Secondary | ICD-10-CM

## 2018-11-16 DIAGNOSIS — Z7982 Long term (current) use of aspirin: Secondary | ICD-10-CM

## 2018-11-16 DIAGNOSIS — Z7984 Long term (current) use of oral hypoglycemic drugs: Secondary | ICD-10-CM

## 2018-11-16 DIAGNOSIS — K861 Other chronic pancreatitis: Secondary | ICD-10-CM

## 2018-11-16 DIAGNOSIS — K219 Gastro-esophageal reflux disease without esophagitis: Secondary | ICD-10-CM

## 2018-11-16 DIAGNOSIS — J449 Chronic obstructive pulmonary disease, unspecified: Secondary | ICD-10-CM

## 2018-11-16 LAB — CBC WITH DIFFERENTIAL/PLATELET
Abs Immature Granulocytes: 0.06 10*3/uL (ref 0.00–0.07)
Basophils Absolute: 0 10*3/uL (ref 0.0–0.1)
Basophils Relative: 0 %
Eosinophils Absolute: 0 10*3/uL (ref 0.0–0.5)
Eosinophils Relative: 0 %
HCT: 28.4 % — ABNORMAL LOW (ref 39.0–52.0)
Hemoglobin: 8.6 g/dL — ABNORMAL LOW (ref 13.0–17.0)
Immature Granulocytes: 1 %
Lymphocytes Relative: 7 %
Lymphs Abs: 0.6 10*3/uL — ABNORMAL LOW (ref 0.7–4.0)
MCH: 26.5 pg (ref 26.0–34.0)
MCHC: 30.3 g/dL (ref 30.0–36.0)
MCV: 87.4 fL (ref 80.0–100.0)
MONO ABS: 0.8 10*3/uL (ref 0.1–1.0)
Monocytes Relative: 9 %
Neutro Abs: 7.7 10*3/uL (ref 1.7–7.7)
Neutrophils Relative %: 83 %
Platelets: 353 10*3/uL (ref 150–400)
RBC: 3.25 MIL/uL — ABNORMAL LOW (ref 4.22–5.81)
RDW: 15.9 % — ABNORMAL HIGH (ref 11.5–15.5)
WBC: 9.2 10*3/uL (ref 4.0–10.5)
nRBC: 0 % (ref 0.0–0.2)

## 2018-11-16 LAB — COMPREHENSIVE METABOLIC PANEL
ALT: 16 U/L (ref 0–44)
AST: 19 U/L (ref 15–41)
Albumin: 3.1 g/dL — ABNORMAL LOW (ref 3.5–5.0)
Alkaline Phosphatase: 86 U/L (ref 38–126)
Anion gap: 18 — ABNORMAL HIGH (ref 5–15)
BUN: 20 mg/dL (ref 8–23)
CO2: 22 mmol/L (ref 22–32)
Calcium: 10.5 mg/dL — ABNORMAL HIGH (ref 8.9–10.3)
Chloride: 93 mmol/L — ABNORMAL LOW (ref 98–111)
Creatinine, Ser: 1.28 mg/dL — ABNORMAL HIGH (ref 0.61–1.24)
GFR calc non Af Amer: 60 mL/min — ABNORMAL LOW (ref >60)
Glucose, Bld: 141 mg/dL — ABNORMAL HIGH (ref 70–99)
Potassium: 5 mmol/L (ref 3.5–5.1)
Sodium: 133 mmol/L — ABNORMAL LOW (ref 135–145)
TOTAL PROTEIN: 8.1 g/dL (ref 6.5–8.1)
Total Bilirubin: 0.3 mg/dL (ref 0.3–1.2)

## 2018-11-16 MED ORDER — FIRST-DUKES MOUTHWASH MT SUSP: 5.0000 mL | Freq: Three times a day (TID) | OROMUCOSAL | 1 refills | Status: DC

## 2018-11-16 MED ORDER — SODIUM CHLORIDE 0.9 % IV SOLN
Freq: Once | INTRAVENOUS | Status: AC
  Administered 2018-11-16: 11:00:00 via INTRAVENOUS

## 2018-11-16 MED ORDER — ZOLEDRONIC ACID 4 MG/100ML IV SOLN
4.0000 mg | Freq: Once | INTRAVENOUS | Status: AC
  Administered 2018-11-16: 4 mg via INTRAVENOUS
  Filled 2018-11-16: qty 100

## 2018-11-16 MED ORDER — HYDROMORPHONE HCL 2 MG PO TABS
2.0000 mg | ORAL_TABLET | Freq: Once | ORAL | Status: AC
  Administered 2018-11-16: 2 mg via ORAL
  Filled 2018-11-16: qty 1

## 2018-11-16 NOTE — Patient Instructions (Signed)
[facility]  Discharge Instructions:   _______________________________________________________________  Thank you for choosing [facility] at [facility] to provide your oncology and hematology care.  To afford each patient quality time with our providers, please arrive at least 15 minutes before your scheduled appointment.  You need to re-schedule your appointment if you arrive 10 or more minutes late.  We strive to give you quality time with our providers, and arriving late affects you and other patients whose appointments are after yours.  Also, if you no show three or more times for appointments you may be dismissed from the clinic.  Again, thank you for choosing [facility]. Our hope is that these requests will allow you access to exceptional care and in a timely manner. _______________________________________________________________  If you have questions after your visit, please contact our office at [phone] between the hours of 8:30 a.m. and 5:00 p.m. Voicemails left after 4:30 p.m. will not be returned until the following business day. _______________________________________________________________  For prescription refill requests, have your pharmacy contact our office. _______________________________________________________________  Recommendations made by the consultant and any test results will be sent to your referring physician. _______________________________________________________________ 

## 2018-11-16 NOTE — Assessment & Plan Note (Signed)
1.  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 -Patient evaluated by Dr. Roxan Hockey and was recommended to have preoperative chemoradiation therapy. -Chemoradiation therapy with carboplatin and paclitaxel from 02/20/2018 through 04/04/2018. -I have reviewed the results of the CT scan of the chest dated 04/27/2018 which showed regression of the tumor which is sub-solid in appearance.  No new areas were seen. - He was started on consolidation Durvalumab on 05/03/2018.  He is tolerating it very well. -We discussed the results of the CT scan of the chest with contrast dated 07/31/2018 which shows slight interval worsening of destruction of the posterolateral right second and third ribs.  There was also mention of slight worsening by few millimeters of the right apical lung mass. - I have discussed the findings on the PET CT scan dated 08/30/2018 showing peripheral right apical tumor with rib and chest wall invasion, progressive compared to prior PET scan, with medial competence and increased chest wall invasion.  SUV max is similar.  Right paratracheal/mediastinal lymph node demonstrates increased hypermetabolism. - He received 1 dose of palliative radiation on 10/16/2018.  He did not notice any improvement in the pain. -Cycle 1 of gemcitabine and carboplatin on 10/19/2018. -Cycle 2 on 11/09/2018.  He is not experiencing any chemotherapy related side effects. - He went to the emergency room on 11/13/2018 with back pain. -We have reviewed his labs.  His calcium was elevated.  Corrected calcium is 11.3.  Hence I would hold his chemotherapy.  His creatinine also went up.  We will give him hydration and Zometa. -We will review his labs next week to start his cycle 3.  After cycle 3, I have recommended doing a PET CT scan for evaluation.  2.  Weight loss: He is continuing to lose weight secondary  to his active cancer and intractable pain. - We have initiated chemotherapy today and hopefully it helps.  3.  Recurrent prostate cancer: He had a history of prostate cancer and reportedly received radiation therapy 6-7 years ago.  He had prostate biopsies on 01/17/2018 which showed recurrence with a Gleason score (5+5)=10.  He will be treated with GnRH agonist.  4.  Cancer related pain: -He was taking Dilaudid 4 mg every 2 hours. -This did not completely improve his pain.  We have started him on fentanyl patch 50 mcg on 11/12/2018. -He has not noticed any difference in the pain yet.  His blood pressure today is 89 systolic.  Hence I am reluctant to increase the dose. -I have recommended lidocaine patch 12 hours on, 12 hours off.

## 2018-11-16 NOTE — Progress Notes (Signed)
No treatment today Give 1 liter NS and zometa.  Start treatment back next week

## 2018-11-16 NOTE — Progress Notes (Signed)
Nutrition Follow-up:  Patient with lung cancer and noted disease progression.  Patient receiving palliative chemotherapy.  Treatment on hold today and just giving IV fluids.    Met with patient prior to MD visit. Sister is with patient today.  Patient reports appetite is not good.  Reports that his neighbors are coming in his house and eating his food when he is not at home. Reports that he has access to food and ability to pay for food but neighbors are taking it.  "I am going to work on that because I don't want to feed the neighborhood." Denies nausea effecting appetite.  Reports pain takes appetite away sometimes.  Reports that he continues to drink ensure.      Medications: reviewed  Labs: creatinine 1.48, BUN 20, Na 133  Anthropometrics:   Weight has decreased to 104 lb 9.6 oz today from 109 lb 9.6 oz on 1/10.    141 lb on 07/06/18   NUTRITION DIAGNOSIS: Severe malnutrition continues   MALNUTRITION DIAGNOSIS: severe malnutrition continues   INTERVENTION:  Additional case of ensure given to patient today.   Discussed high calorie, high protein foods with patient and sister.     MONITORING, EVALUATION, GOAL: weight trends, intake   NEXT VISIT: Jan 24 during infusion  Dominyk Law B. Zenia Resides, [place], [place] Registered Dietitian [phone] (pager)

## 2018-11-16 NOTE — Progress Notes (Signed)
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 There is early bony invasion of the right second rib and cortical destruction indicating local rib/chest wall invasion by tumor. 2. There is additional pleural thickening and pleural calcification along the right posterior pleural surface which is not separately hypermetabolic. 3. Small mediastinal lymph nodes are not appreciably hypermetabolic. 4. Multifocal and some diffuse regions of high activity in the bowel are likely physiologic. 5. No findings of metastatic disease to the neck, abdomen/pelvis, or to the included remainder of the skeleton. 6. Other imaging findings of potential clinical significance: Chronic right maxillary sinusitis. Aortic Atherosclerosis (ICD10-I70.0) and Emphysema (ICD10-J43.9). Chronic calcific pancreatitis.     01/15/2018 Initial Diagnosis    Squamous cell carcinoma of right lung ([place])    01/15/2018 Initial Biopsy    Lung needle core biopsy RUL: Squamous cell carcinoma    02/19/2018 Procedure    Port-a-cath placement by Dr. Arnoldo Morale     02/20/2018 -  Chemotherapy    The patient had palonosetron (ALOXI) injection 0.25 mg, 0.25 mg, Intravenous,  Once, 1 of 4 cycles Administration: 0.25 mg (02/20/2018) CARBOplatin (PARAPLATIN) 230 mg in sodium chloride 0.9 % 250 mL chemo infusion, 230 mg (100 % of original dose 225.8 mg), Intravenous,  Once, 1 of 4  cycles Dose modification:   (original dose 225.8 mg, Cycle 1) PACLitaxel (TAXOL) 78 mg in dextrose 5 % 250 mL chemo infusion (</= 80mg /m2), 45 mg/m2 = 78 mg, Intravenous,  Once, 1 of 4 cycles Administration: 78 mg (02/20/2018)  for chemotherapy treatment.     05/06/2018 - 10/03/2018 Chemotherapy    The patient had durvalumab (IMFINZI) 620 mg in sodium chloride 0.9 % 100 mL chemo infusion, 10 mg/kg = 620 mg, Intravenous,  Once, 10 of 12 cycles Administration: 620 mg (05/11/2018), 620 mg (05/25/2018), 620 mg (06/08/2018), 620 mg (06/22/2018), 620 mg (07/06/2018), 620 mg (07/20/2018), 620 mg (08/08/2018), 620 mg (08/22/2018), 620 mg (09/05/2018), 620 mg (09/19/2018)  for chemotherapy treatment.     10/19/2018 -  Chemotherapy    The patient had palonosetron (ALOXI) injection 0.25 mg, 0.25 mg, Intravenous,  Once, 2 of 4 cycles Administration: 0.25 mg (10/19/2018), 0.25 mg (11/09/2018) ondansetron (ZOFRAN) 8 mg in sodium chloride 0.9 % 50 mL IVPB, 8 mg (100 % of original dose 8 mg), Intravenous,  Once, 2 of 4 cycles Dose modification: 8 mg (original dose 8 mg, Cycle 1) Administration: 8 mg (10/26/2018) CARBOplatin (PARAPLATIN) 500 mg in sodium chloride 0.9 % 250 mL chemo infusion, 500 mg (100 % of original dose 501 mg), Intravenous,  Once, 2 of 4 cycles Dose modification:   (original dose 501 mg, Cycle 1),   (original dose 501 mg, Cycle 2) Administration: 500 mg (10/19/2018), 500 mg (11/09/2018) gemcitabine (GEMZAR) 1,634 mg in sodium chloride 0.9 % 250 mL chemo infusion, 1,000 mg/m2 = 1,634 mg, Intravenous,  Once, 2 of 4 cycles Administration: 1,634 mg (10/19/2018), 1,634 mg (10/26/2018), 1,634 mg (11/09/2018)  for  chemotherapy treatment.      Adenocarcinoma of prostate ([place])   10/24/2015 Tumor Marker    PSA 1.53    01/06/2016 Tumor Marker    PSA 1.6    07/31/2017 Tumor Marker    PSA 4.6    01/17/2018 Initial Diagnosis    Adenocarcinoma of prostate ([place])    01/17/2018 Procedure    12-core prostate biopsy  revealed 6/12 cores positive for prostate adenocarcinoma (Dr[facility]).  (L) base lat: Gleason 9 (5+4), 20%. (L) mid lat: Gleason 9 (4+5), 40%. (L) apex lat: Gleason 9 (4+5), 20%. (L) base: Gleason 9 (5+4), 20%. (L) mid: Gleason 10 (5+5), 5%. (L) apex: Gleason 9 (5+4), 9%.       INTERVAL HISTORY:  Mr. Matulich 63 y.o. male returns for routine follow-up for squamous cell carcinoma of the right lung. He recently had a trip to the ER due to his increased pain. His appetite is decreased and he has lost 10 pounds since the beginning of the year. He was a little mixed up on how he was taking his blood pressure medication and his sister reported his wife was helping him with that. Denies any nausea, vomiting, or diarrhea. Had not noticed any recent bleeding such as epistaxis, hematuria or hematochezia. Denies recent chest pain on exertion, shortness of breath on minimal exertion, pre-syncopal episodes, or palpitations. Denies any numbness or tingling in hands or feet. Denies any recent fevers, infections, or recent hospitalizations. Patient reports appetite at 25% and energy level at 25%.   REVIEW OF SYSTEMS:  Review of Systems  Constitutional: Positive for fatigue.  Musculoskeletal: Positive for back pain.  Neurological: Positive for extremity weakness.  All other systems reviewed and are negative.    PAST MEDICAL/SURGICAL HISTORY:  Past Medical History:  Diagnosis Date  . Bursitis   . CAD (coronary artery disease)     s/p stent placement unknown artery 12/2014,  . Cancer [facility])    prostate cancer  . COPD (chronic obstructive pulmonary disease) ([place])   . Diabetes mellitus ([place])   . GERD (gastroesophageal reflux disease)   . History of prostate cancer   . Hyperlipemia   . Hypertension   . Peripheral arterial disease ([place])    a. h/o stents. b. 03/2016  - successful PTA and covered stenting using overlapping Viabahn covered stents of a long segment in-stent restenosis of  previously placed nitinol self expanding stents in the proximal mid and distal right SFA.  . Tobacco abuse    Past Surgical History:  Procedure Laterality Date  . COLONOSCOPY WITH PROPOFOL N/A 11/22/2016   Procedure: COLONOSCOPY WITH PROPOFOL;  Surgeon: [name], MD;  Location: [facility] ENDO SUITE;  Service: Endoscopy;  Laterality: N/A;  10:00 am  . CORONARY STENT PLACEMENT  12-2014   [facility] [employer]  . FEMORAL ARTERY STENT Bilateral 03/2013  . PERIPHERAL VASCULAR CATHETERIZATION N/A 04/04/2016   Procedure: Lower Extremity Angiography;  Surgeon: Lorretta Harp, MD;  Location: [place] CV LAB;  Service: Cardiovascular;  Laterality: N/A;  . PERIPHERAL VASCULAR CATHETERIZATION N/A 04/04/2016   Procedure: Abdominal Aortogram;  Surgeon: Lorretta Harp, MD;  Location: [place] CV LAB;  Service: Cardiovascular;  Laterality: N/A;  . PERIPHERAL VASCULAR CATHETERIZATION  04/04/2016   Procedure: Peripheral Vascular Intervention;  Surgeon: Lorretta Harp, MD;  Location: [place] CV LAB;  Service: Cardiovascular;;  rt SFA stent  . POLYPECTOMY  11/22/2016   Procedure: POLYPECTOMY;  Surgeon: [name], MD;  Location: [facility] END[facility];  Service: Endoscopy;;  descending colon polyp, transverse colon polyp, sigmoid colon polyp, rectal polyp  . PORTACATH PLACEMENT Left 02/19/2018   Procedure: INSERTION PORT-A-CATH;  Surgeon: Aviva Signs, MD;  Location: [facility] ORS;  Service: General;  Laterality: Left;  . STENT PLACEMENT VASCULAR ([place] [facility])  2013     SOCIAL HISTORY:  Social History   Socioeconomic History  . Marital status: Single    Spouse name: Not on file  . Number of children: Not on file  . Years of education: Not on file  . Highest education level: Not on file  Occupational History  . Not on file  Social Needs  . Financial resource strain: Not on file  . Food insecurity:    Worry: Not on file    Inability: Not on file  . Transportation needs:    Medical: Not on file      Non-medical: Not on file  Tobacco Use  . Smoking status: Current Every Day Smoker    Packs/day: 0.25    Years: 45.00    Pack years: 11.25    Types: Cigarettes    Start date: 02/12/1971  . Smokeless tobacco: Never Used  Substance and Sexual Activity  . Alcohol use: Not Currently    Alcohol/week: 0.0 standard drinks    Comment: stopped 2 weeks ago.  . Drug use: No  . Sexual activity: Yes  Lifestyle  . Physical activity:    Days per week: Not on file    Minutes per session: Not on file  . Stress: Not on file  Relationships  . Social connections:    Talks on phone: Not on file    Gets together: Not on file    Attends religious service: Not on file    Active member of club or organization: Not on file    Attends meetings of clubs or organizations: Not on file    Relationship status: Not on file  . Intimate partner violence:    Fear of current or ex partner: Not on file    Emotionally abused: Not on file    Physically abused: Not on file    Forced sexual activity: Not on file  Other Topics Concern  . Not on file  Social History Narrative  . Not on file    FAMILY HISTORY:  Family History  Problem Relation Age of Onset  . Diabetes Mother   . Heart disease Mother   . Hypertension Mother   . Healthy Brother   . Other Brother        accident   . Arthritis Sister   . Arthritis Sister   . SIDS Brother   . Colon cancer Neg Hx     CURRENT MEDICATIONS:  Outpatient Encounter Medications as of 11/16/2018  Medication Sig  . aspirin 81 MG chewable tablet Chew 81 mg by mouth daily.  [name] atorvastatin (LIPITOR) 80 MG tablet Take 80 mg by mouth at bedtime.   [name] CARBOPLATIN IV Inject into the vein every 21 ( twenty-one) days.   . cilostazol (PLETAL) 50 MG tablet TAKE 1 TABLET(50 MG) BY MOUTH TWICE DAILY  . Coenzyme Q10 100 MG capsule Take 100 mg by mouth at bedtime.   . dronabinol (MARINOL) 5 MG capsule Take 1 capsule (5 mg total) by mouth 2 (two) times daily before a meal.  .  fentaNYL (DURAGESIC - DOSED MCG/HR) 50 MCG/HR Place 1 patch (50 mcg total) onto the skin every 3 (three) days.  [name] FLOVENT HFA 110 MCG/ACT  inhaler Inhale 1 puff into the lungs 2 (two) times daily.  . fluticasone (FLONASE) 50 MCG/ACT nasal spray Place 1 spray into both nostrils daily.  . Gemcitabine HCl (GEMZAR IV) Inject into the vein. Day 1 day 8 every 21 days.  [name] HYDROmorphone (DILAUDID) 2 MG tablet Take 2 tablets (4 mg total) by mouth every 2 (two) hours as needed for severe pain.  . hydrOXYzine (ATARAX/VISTARIL) 50 MG tablet Take 50 mg by mouth 3 (three) times daily as needed.   [name] ipratropium-albuterol (DUONEB) 0.5-2.5 (3) MG/3ML SOLN Take 3 mLs by nebulization every 6 (six) hours as needed (for shortness of breath).  . lidocaine-prilocaine (EMLA) cream Apply a quarter size amount to affected area 1 hour prior to coming to chemotherapy.  Do not rub in.  Cover with plastic wrap.  . metFORMIN (GLUCOPHAGE) 500 MG tablet Take 500 mg by mouth every other day.   . methocarbamol (ROBAXIN) 500 MG tablet Take 500 mg by mouth every 6 (six) hours as needed.   . metoprolol succinate (TOPROL-XL) 50 MG 24 hr tablet Take 50 mg by mouth at bedtime.   . Omega-3 Fatty Acids (FISH OIL) 1000 MG CAPS Take 1 capsule by mouth 2 (two) times daily.   . ondansetron (ZOFRAN) 8 MG tablet Take 1 tablet (8 mg total) by mouth every 8 (eight) hours as needed for nausea or vomiting.  . pantoprazole (PROTONIX) 40 MG tablet Take 40 mg by mouth 2 (two) times daily.  . prochlorperazine (COMPAZINE) 10 MG tablet Take 1 tablet (10 mg total) by mouth every 6 (six) hours as needed for nausea or vomiting.  . [DISCONTINUED] Diphenhyd-Hydrocort-Nystatin (FIRST-DUKES MOUTHWASH MT) Take by mouth.  . [DISCONTINUED] sucralfate (CARAFATE) 1 GM/10ML suspension Carafate 1 gm / 79ml and viscous lidocaine 2% 1:1.  Swish & swallow 68ml four times daily.   No facility-administered encounter medications on file as of 11/16/2018.     ALLERGIES:    Allergies  Allergen Reactions  . Lisinopril Anaphylaxis  . Penicillins Anaphylaxis    Has patient had a PCN reaction causing immediate rash, facial/tongue/throat swelling, SOB or lightheadedness with hypotension:Yes Has patient had a PCN reaction causing severe rash involving mucus membranes or skin necrosis:unsure Has patient had a PCN reaction that required hospitalization:unsure Has patient had a PCN reaction occurring within the last 10 years:~10 years per patient If all of the above answers are "NO", then may proceed with Cephalosporin use.   . Ace Inhibitors Swelling     PHYSICAL EXAM:  ECOG Performance status: 1  Vitals:   11/16/18 0915  BP: (!) 89/63  Pulse: (!) 110  Resp: 18  Temp: 97.6 F (36.4 C)  SpO2: 100%   Filed Weights   11/16/18 0915  Weight: 104 lb 9.6 oz (47.4 kg)    Physical Exam Constitutional:      Appearance: Normal appearance. He is normal weight.  Musculoskeletal: Normal range of motion.  Skin:    General: Skin is warm and dry.  Neurological:     Mental Status: He is alert and oriented to person, place, and time. Mental status is at baseline.  Psychiatric:        Mood and Affect: Mood normal.        Behavior: Behavior normal.        Thought Content: Thought content normal.      LABORATORY DATA:  I have reviewed the labs as listed.  CBC    Component Value Date/Time   WBC 9.2 11/16/2018 0835  RBC 3.25 (L) 11/16/2018 0835   HGB 8.6 (L) 11/16/2018 0835   HCT 28.4 (L) 11/16/2018 0835   PLT 353 11/16/2018 0835   MCV 87.4 11/16/2018 0835   MCH 26.5 11/16/2018 0835   MCHC 30.3 11/16/2018 0835   RDW 15.9 (H) 11/16/2018 0835   LYMPHSABS 0.6 (L) 11/16/2018 0835   MONOABS 0.8 11/16/2018 0835   EOSABS 0.0 11/16/2018 0835   BASOSABS 0.0 11/16/2018 0835   CMP Latest Ref Rng & Units 11/16/2018 11/09/2018 10/26/2018  Glucose 70 - 99 mg/dL 141(H) 137(H) 140(H)  BUN 8 - 23 mg/dL 20 7(L) 9  Creatinine 0.61 - 1.24 mg/dL 1.28(H) 0.40(L) 0.69   Sodium 135 - 145 mmol/L 133(L) 132(L) 133(L)  Potassium 3.5 - 5.1 mmol/L 5.0 4.3 4.8  Chloride 98 - 111 mmol/L 93(L) 94(L) 93(L)  CO2 22 - 32 mmol/L 22 27 24   Calcium 8.9 - 10.3 mg/dL 10.5(H) 9.6 10.2  Total Protein 6.5 - 8.1 g/dL 8.1 7.6 8.4(H)  Total Bilirubin 0.3 - 1.2 mg/dL 0.3 0.3 0.5  Alkaline Phos 38 - 126 U/L 86 88 74  AST 15 - 41 U/L 19 19 32  ALT 0 - 44 U/L 16 21 22        DIAGNOSTIC IMAGING:  I have independently reviewed the scans and discussed with the patient.   I have reviewed Francene Finders, NP's note and agree with the documentation.  I personally performed a face-to-face visit, made revisions and my assessment and plan is as follows.    ASSESSMENT & PLAN:   Squamous cell carcinoma of right lung ([place]) 1.  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 -Patient evaluated by Dr. Roxan Hockey and was recommended to have preoperative chemoradiation therapy. -Chemoradiation therapy with carboplatin and paclitaxel from 02/20/2018 through 04/04/2018. -I have reviewed the results of the CT scan of the chest dated 04/27/2018 which showed regression of the tumor which is sub-solid in appearance.  No new areas were seen. - He was started on consolidation Durvalumab on 05/03/2018.  He is tolerating it very well. -We discussed the results of the CT scan of the chest with contrast dated 07/31/2018 which shows slight interval worsening of destruction of the posterolateral right second and third ribs.  There was also mention of slight worsening by few millimeters of the right apical lung mass. - I have discussed the findings on the PET CT scan dated 08/30/2018 showing peripheral right apical tumor with rib and chest wall invasion, progressive compared to prior PET scan, with medial competence and increased chest wall invasion.  SUV max is similar.  Right  paratracheal/mediastinal lymph node demonstrates increased hypermetabolism. - He received 1 dose of palliative radiation on 10/16/2018.  He did not notice any improvement in the pain. -Cycle 1 of gemcitabine and carboplatin on 10/19/2018. -Cycle 2 on 11/09/2018.  He is not experiencing any chemotherapy related side effects. - He went to the emergency room on 11/13/2018 with back pain. -We have reviewed his labs.  His calcium was elevated.  Corrected calcium is 11.3.  Hence I would hold his chemotherapy.  His creatinine also went up.  We will give him hydration and Zometa. -We will review his labs next week to start his cycle 3.  After cycle 3, I have recommended doing a PET CT scan for evaluation.  2.  Weight loss: He is continuing to lose weight secondary to his  active cancer and intractable pain. - We have initiated chemotherapy today and hopefully it helps.  3.  Recurrent prostate cancer: He had a history of prostate cancer and reportedly received radiation therapy 6-7 years ago.  He had prostate biopsies on 01/17/2018 which showed recurrence with a Gleason score (5+5)=10.  He will be treated with GnRH agonist.  4.  Cancer related pain: -He was taking Dilaudid 4 mg every 2 hours. -This did not completely improve his pain.  We have started him on fentanyl patch 50 mcg on 11/12/2018. -He has not noticed any difference in the pain yet.  His blood pressure today is 89 systolic.  Hence I am reluctant to increase the dose. -I have recommended lidocaine patch 12 hours on, 12 hours off.      Orders placed this encounter:  Orders Placed This Encounter  Procedures  . NM PET Image Restag (PS) Skull Base To Thigh  . CT CHEST W CONTRAST  . CT Abdomen Pelvis W Contrast  . TSH  . CBC with Differential/Platelet  . Comprehensive metabolic panel      Derek Jack, MD [place] [phone]

## 2018-11-16 NOTE — Progress Notes (Signed)
Reviewed progress note by NP and lab review with ok for hydration and zometa today.    Patient tolerated hydration and zometa with no complaints voiced.  Port site clean and dry with no bruising or swelling noted at site.  Good blood return noted before and after administration of chemotherapy.  Band aid applied.  Patient left ambulatory with VSS and no s/s of distress noted.

## 2018-11-16 NOTE — Patient Instructions (Signed)
[facility] at [facility] Discharge Instructions     Thank you for choosing [facility] at [facility] to provide your oncology and hematology care.  To afford each patient quality time with our provider, please arrive at least 15 minutes before your scheduled appointment time.   If you have a lab appointment with the Cancer Center please come in thru the  Main Entrance and check in at the main information desk  You need to re-schedule your appointment should you arrive 10 or more minutes late.  We strive to give you quality time with our providers, and arriving late affects you and other patients whose appointments are after yours.  Also, if you no show three or more times for appointments you may be dismissed from the clinic at the providers discretion.     Again, thank you for choosing [facility].  Our hope is that these requests will decrease the amount of time that you wait before being seen by our physicians.       _____________________________________________________________  Should you have questions after your visit to [facility], please contact our office at [phone] between the hours of 8:00 a.m. and 4:30 p.m.  Voicemails left after 4:00 p.m. will not be returned until the following business day.  For prescription refill requests, have your pharmacy contact our office and allow 72 hours.    Cancer Center Support Programs:   > Cancer Support Group  2nd Tuesday of the month 1pm-2pm, Journey Room    

## 2018-11-20 ENCOUNTER — Encounter (HOSPITAL_COMMUNITY): Payer: Self-pay | Admitting: *Deleted

## 2018-11-20 NOTE — Progress Notes (Signed)
Patient's SO called clinic stating he had a headache x 2 days. Has been taking tylenol every 8 hours without relief. He feels like it is his sinuses because the pain is between his eyes.  Nurse advised patient, per Dr. Delton Coombes, to try alternating acetaminophen with ibuprofen and to use an OTC sinus medication or sudafed to relieve the symptoms. He was advised to return call to clinic should he have any other concerns. He verbalizes understanding.

## 2018-11-21 ENCOUNTER — Encounter (HOSPITAL_COMMUNITY): Payer: Self-pay | Admitting: *Deleted

## 2018-11-21 ENCOUNTER — Other Ambulatory Visit (HOSPITAL_COMMUNITY): Payer: Self-pay | Admitting: Pharmacist

## 2018-11-21 NOTE — Progress Notes (Addendum)
Patient's significant other called stating that patient was c/o upper respiratory symptoms and had called his PCP and was prescribed doxycycline and prednisone for 10 days.  I spoke with Dr. Delton Coombes and we will defer his treatment this week for one week. We will drop C2D8 and restart with C3D1.  Treatment plan has been updated.

## 2018-11-23 ENCOUNTER — Encounter (HOSPITAL_COMMUNITY): Payer: Medicare Other

## 2018-11-23 ENCOUNTER — Ambulatory Visit (HOSPITAL_COMMUNITY): Payer: Medicare Other

## 2018-11-23 ENCOUNTER — Other Ambulatory Visit (HOSPITAL_COMMUNITY): Payer: Medicare Other

## 2018-11-26 ENCOUNTER — Emergency Department (HOSPITAL_COMMUNITY): Payer: Medicare Other

## 2018-11-26 ENCOUNTER — Encounter (HOSPITAL_COMMUNITY): Payer: Self-pay | Admitting: *Deleted

## 2018-11-26 ENCOUNTER — Encounter (HOSPITAL_COMMUNITY): Payer: Self-pay

## 2018-11-26 ENCOUNTER — Inpatient Hospital Stay (HOSPITAL_COMMUNITY): Payer: Medicare Other

## 2018-11-26 ENCOUNTER — Other Ambulatory Visit: Payer: Self-pay

## 2018-11-26 ENCOUNTER — Inpatient Hospital Stay (HOSPITAL_COMMUNITY)
Admission: EM | Admit: 2018-11-26 | Discharge: 2018-12-06 | DRG: 682 | Disposition: A | Discharge status: Home Health | Payer: Medicare Other | Attending: Internal Medicine | Admitting: Internal Medicine

## 2018-11-26 DIAGNOSIS — R112 Nausea with vomiting, unspecified: Secondary | ICD-10-CM

## 2018-11-26 DIAGNOSIS — E1151 Type 2 diabetes mellitus with diabetic peripheral angiopathy without gangrene: Secondary | ICD-10-CM

## 2018-11-26 DIAGNOSIS — G8929 Other chronic pain: Secondary | ICD-10-CM | POA: Diagnosis present

## 2018-11-26 DIAGNOSIS — Z79899 Other long term (current) drug therapy: Secondary | ICD-10-CM

## 2018-11-26 DIAGNOSIS — N178 Other acute kidney failure: Secondary | ICD-10-CM | POA: Diagnosis not present

## 2018-11-26 DIAGNOSIS — E43 Unspecified severe protein-calorie malnutrition: Secondary | ICD-10-CM | POA: Diagnosis present

## 2018-11-26 DIAGNOSIS — E876 Hypokalemia: Secondary | ICD-10-CM | POA: Diagnosis present

## 2018-11-26 DIAGNOSIS — B370 Candidal stomatitis: Secondary | ICD-10-CM | POA: Diagnosis present

## 2018-11-26 DIAGNOSIS — Z888 Allergy status to other drugs, medicaments and biological substances status: Secondary | ICD-10-CM

## 2018-11-26 DIAGNOSIS — I2510 Atherosclerotic heart disease of native coronary artery without angina pectoris: Secondary | ICD-10-CM | POA: Diagnosis present

## 2018-11-26 DIAGNOSIS — J449 Chronic obstructive pulmonary disease, unspecified: Secondary | ICD-10-CM | POA: Diagnosis present

## 2018-11-26 DIAGNOSIS — Z515 Encounter for palliative care: Secondary | ICD-10-CM

## 2018-11-26 DIAGNOSIS — E874 Mixed disorder of acid-base balance: Secondary | ICD-10-CM | POA: Diagnosis present

## 2018-11-26 DIAGNOSIS — Z720 Tobacco use: Secondary | ICD-10-CM | POA: Diagnosis not present

## 2018-11-26 DIAGNOSIS — N3289 Other specified disorders of bladder: Secondary | ICD-10-CM | POA: Diagnosis present

## 2018-11-26 DIAGNOSIS — Z833 Family history of diabetes mellitus: Secondary | ICD-10-CM

## 2018-11-26 DIAGNOSIS — C3491 Malignant neoplasm of unspecified part of right bronchus or lung: Secondary | ICD-10-CM | POA: Diagnosis not present

## 2018-11-26 DIAGNOSIS — F419 Anxiety disorder, unspecified: Secondary | ICD-10-CM | POA: Diagnosis present

## 2018-11-26 DIAGNOSIS — E1159 Type 2 diabetes mellitus with other circulatory complications: Secondary | ICD-10-CM | POA: Diagnosis present

## 2018-11-26 DIAGNOSIS — R1110 Vomiting, unspecified: Secondary | ICD-10-CM

## 2018-11-26 DIAGNOSIS — E860 Dehydration: Secondary | ICD-10-CM | POA: Diagnosis present

## 2018-11-26 DIAGNOSIS — Z880 Allergy status to penicillin: Secondary | ICD-10-CM

## 2018-11-26 DIAGNOSIS — R627 Adult failure to thrive: Secondary | ICD-10-CM | POA: Diagnosis present

## 2018-11-26 DIAGNOSIS — Z7982 Long term (current) use of aspirin: Secondary | ICD-10-CM

## 2018-11-26 DIAGNOSIS — C3490 Malignant neoplasm of unspecified part of unspecified bronchus or lung: Secondary | ICD-10-CM

## 2018-11-26 DIAGNOSIS — E871 Hypo-osmolality and hyponatremia: Secondary | ICD-10-CM | POA: Diagnosis present

## 2018-11-26 DIAGNOSIS — E785 Hyperlipidemia, unspecified: Secondary | ICD-10-CM | POA: Diagnosis present

## 2018-11-26 DIAGNOSIS — I129 Hypertensive chronic kidney disease with stage 1 through stage 4 chronic kidney disease, or unspecified chronic kidney disease: Secondary | ICD-10-CM | POA: Diagnosis present

## 2018-11-26 DIAGNOSIS — T451X5A Adverse effect of antineoplastic and immunosuppressive drugs, initial encounter: Secondary | ICD-10-CM | POA: Diagnosis present

## 2018-11-26 DIAGNOSIS — Z7189 Other specified counseling: Secondary | ICD-10-CM

## 2018-11-26 DIAGNOSIS — F17210 Nicotine dependence, cigarettes, uncomplicated: Secondary | ICD-10-CM

## 2018-11-26 DIAGNOSIS — Z681 Body mass index (BMI) 19 or less, adult: Secondary | ICD-10-CM | POA: Diagnosis not present

## 2018-11-26 DIAGNOSIS — N179 Acute kidney failure, unspecified: Secondary | ICD-10-CM | POA: Diagnosis present

## 2018-11-26 DIAGNOSIS — N170 Acute kidney failure with tubular necrosis: Secondary | ICD-10-CM | POA: Diagnosis not present

## 2018-11-26 DIAGNOSIS — C3411 Malignant neoplasm of upper lobe, right bronchus or lung: Secondary | ICD-10-CM | POA: Diagnosis present

## 2018-11-26 DIAGNOSIS — Z8546 Personal history of malignant neoplasm of prostate: Secondary | ICD-10-CM

## 2018-11-26 DIAGNOSIS — N183 Chronic kidney disease, stage 3 (moderate): Secondary | ICD-10-CM | POA: Diagnosis present

## 2018-11-26 DIAGNOSIS — R358 Other polyuria: Secondary | ICD-10-CM | POA: Diagnosis not present

## 2018-11-26 DIAGNOSIS — D6481 Anemia due to antineoplastic chemotherapy: Secondary | ICD-10-CM | POA: Diagnosis present

## 2018-11-26 DIAGNOSIS — Z86010 Personal history of colonic polyps: Secondary | ICD-10-CM

## 2018-11-26 DIAGNOSIS — Z7984 Long term (current) use of oral hypoglycemic drugs: Secondary | ICD-10-CM

## 2018-11-26 DIAGNOSIS — E440 Moderate protein-calorie malnutrition: Secondary | ICD-10-CM

## 2018-11-26 DIAGNOSIS — I10 Essential (primary) hypertension: Secondary | ICD-10-CM | POA: Diagnosis present

## 2018-11-26 DIAGNOSIS — E1122 Type 2 diabetes mellitus with diabetic chronic kidney disease: Secondary | ICD-10-CM | POA: Diagnosis present

## 2018-11-26 DIAGNOSIS — Z955 Presence of coronary angioplasty implant and graft: Secondary | ICD-10-CM

## 2018-11-26 DIAGNOSIS — Z8249 Family history of ischemic heart disease and other diseases of the circulatory system: Secondary | ICD-10-CM

## 2018-11-26 DIAGNOSIS — Z7951 Long term (current) use of inhaled steroids: Secondary | ICD-10-CM

## 2018-11-26 DIAGNOSIS — I959 Hypotension, unspecified: Secondary | ICD-10-CM | POA: Diagnosis present

## 2018-11-26 DIAGNOSIS — K219 Gastro-esophageal reflux disease without esophagitis: Secondary | ICD-10-CM | POA: Diagnosis present

## 2018-11-26 HISTORY — DX: Malignant neoplasm of unspecified part of unspecified bronchus or lung: C34.90

## 2018-11-26 LAB — LIPASE, BLOOD: Lipase: 21 U/L (ref 11–51)

## 2018-11-26 LAB — TROPONIN I: Troponin I: <0.03 ng/mL (ref <0.03)

## 2018-11-26 LAB — COMPREHENSIVE METABOLIC PANEL
ALT: 37 U/L (ref 0–44)
AST: 36 U/L (ref 15–41)
Albumin: 2.8 g/dL — ABNORMAL LOW (ref 3.5–5.0)
Alkaline Phosphatase: 82 U/L (ref 38–126)
Anion gap: >20 — ABNORMAL HIGH (ref 5–15)
BILIRUBIN TOTAL: 0.6 mg/dL (ref 0.3–1.2)
BUN: 100 mg/dL — ABNORMAL HIGH (ref 8–23)
CALCIUM: 6.9 mg/dL — AB (ref 8.9–10.3)
CO2: 7 mmol/L — ABNORMAL LOW (ref 22–32)
CREATININE: 9.31 mg/dL — AB (ref 0.61–1.24)
Chloride: 85 mmol/L — ABNORMAL LOW (ref 98–111)
GFR calc Af Amer: 6 mL/min — ABNORMAL LOW (ref >60)
GFR calc non Af Amer: 5 mL/min — ABNORMAL LOW (ref >60)
Glucose, Bld: 102 mg/dL — ABNORMAL HIGH (ref 70–99)
Potassium: 5.3 mmol/L — ABNORMAL HIGH (ref 3.5–5.1)
Sodium: 127 mmol/L — ABNORMAL LOW (ref 135–145)
Total Protein: 7.3 g/dL (ref 6.5–8.1)

## 2018-11-26 LAB — CBC WITH DIFFERENTIAL/PLATELET
Abs Immature Granulocytes: 0.21 10*3/uL — ABNORMAL HIGH (ref 0.00–0.07)
BASOS ABS: 0 10*3/uL (ref 0.0–0.1)
Basophils Relative: 0 %
EOS PCT: 0 %
Eosinophils Absolute: 0 10*3/uL (ref 0.0–0.5)
HCT: 29.7 % — ABNORMAL LOW (ref 39.0–52.0)
Hemoglobin: 9.1 g/dL — ABNORMAL LOW (ref 13.0–17.0)
Immature Granulocytes: 1 %
Lymphocytes Relative: 3 %
Lymphs Abs: 0.4 10*3/uL — ABNORMAL LOW (ref 0.7–4.0)
MCH: 26.2 pg (ref 26.0–34.0)
MCHC: 30.6 g/dL (ref 30.0–36.0)
MCV: 85.6 fL (ref 80.0–100.0)
Monocytes Absolute: 1.2 10*3/uL — ABNORMAL HIGH (ref 0.1–1.0)
Monocytes Relative: 8 %
Neutro Abs: 12.9 10*3/uL — ABNORMAL HIGH (ref 1.7–7.7)
Neutrophils Relative %: 88 %
Platelets: 518 10*3/uL — ABNORMAL HIGH (ref 150–400)
RBC: 3.47 MIL/uL — ABNORMAL LOW (ref 4.22–5.81)
RDW: 17.1 % — ABNORMAL HIGH (ref 11.5–15.5)
WBC: 14.8 10*3/uL — ABNORMAL HIGH (ref 4.0–10.5)
nRBC: 0 % (ref 0.0–0.2)

## 2018-11-26 MED ORDER — HYDROXYZINE HCL 25 MG PO TABS: 25.0000 mg | ORAL_TABLET | Freq: Three times a day (TID) | ORAL | Status: DC | PRN

## 2018-11-26 MED ORDER — NICOTINE 14 MG/24HR TD PT24
14.0000 mg | MEDICATED_PATCH | Freq: Every day | TRANSDERMAL | Status: DC
  Administered 2018-11-26 – 2018-12-06 (×11): 14 mg via TRANSDERMAL
  Filled 2018-11-26 (×11): qty 1

## 2018-11-26 MED ORDER — HYDROMORPHONE HCL 4 MG PO TABS
4.0000 mg | ORAL_TABLET | ORAL | Status: DC | PRN
  Administered 2018-11-26 – 2018-12-05 (×7): 4 mg via ORAL
  Filled 2018-11-26 (×7): qty 1

## 2018-11-26 MED ORDER — ATORVASTATIN CALCIUM 40 MG PO TABS
80.0000 mg | ORAL_TABLET | Freq: Every day | ORAL | Status: DC
  Administered 2018-11-26 – 2018-12-05 (×8): 80 mg via ORAL
  Filled 2018-11-26 (×10): qty 2

## 2018-11-26 MED ORDER — DOCUSATE SODIUM 283 MG RE ENEM
1.0000 | ENEMA | RECTAL | Status: DC | PRN
  Filled 2018-11-26: qty 1

## 2018-11-26 MED ORDER — MAGIC MOUTHWASH
5.0000 mL | Freq: Three times a day (TID) | ORAL | Status: DC
  Administered 2018-11-26 – 2018-12-06 (×27): 5 mL via ORAL
  Filled 2018-11-26 (×33): qty 5

## 2018-11-26 MED ORDER — IPRATROPIUM-ALBUTEROL 0.5-2.5 (3) MG/3ML IN SOLN: 3.0000 mL | Freq: Four times a day (QID) | RESPIRATORY_TRACT | Status: DC | PRN

## 2018-11-26 MED ORDER — METOPROLOL SUCCINATE ER 50 MG PO TB24
50.0000 mg | ORAL_TABLET | Freq: Every day | ORAL | Status: DC
  Administered 2018-11-26 – 2018-12-06 (×10): 50 mg via ORAL
  Filled 2018-11-26 (×11): qty 1

## 2018-11-26 MED ORDER — CAMPHOR-MENTHOL 0.5-0.5 % EX LOTN
1.0000 "application " | TOPICAL_LOTION | Freq: Three times a day (TID) | CUTANEOUS | Status: DC | PRN
  Filled 2018-11-26 (×2): qty 222

## 2018-11-26 MED ORDER — ENOXAPARIN SODIUM 30 MG/0.3ML ~~LOC~~ SOLN
30.0000 mg | SUBCUTANEOUS | Status: DC
  Administered 2018-11-26: 30 mg via SUBCUTANEOUS
  Filled 2018-11-26: qty 0.3

## 2018-11-26 MED ORDER — SODIUM CHLORIDE 0.9 % IV SOLN
Freq: Once | INTRAVENOUS | Status: AC
  Administered 2018-11-26: 17:00:00 via INTRAVENOUS

## 2018-11-26 MED ORDER — CALCIUM CARBONATE ANTACID 1250 MG/5ML PO SUSP
500.0000 mg | Freq: Four times a day (QID) | ORAL | Status: DC | PRN
  Filled 2018-11-26: qty 5

## 2018-11-26 MED ORDER — SODIUM BICARBONATE 8.4 % IV SOLN
INTRAVENOUS | Status: AC
  Filled 2018-11-26: qty 250

## 2018-11-26 MED ORDER — CILOSTAZOL 100 MG PO TABS
50.0000 mg | ORAL_TABLET | Freq: Two times a day (BID) | ORAL | Status: DC
  Administered 2018-11-26 – 2018-12-06 (×17): 50 mg via ORAL
  Filled 2018-11-26 (×17): qty 1
  Filled 2018-11-26: qty 0.5
  Filled 2018-11-26 (×7): qty 1

## 2018-11-26 MED ORDER — SODIUM BICARBONATE 8.4 % IV SOLN
100.0000 meq | Freq: Once | INTRAVENOUS | Status: AC
  Administered 2018-11-26: 100 meq via INTRAVENOUS
  Filled 2018-11-26: qty 50

## 2018-11-26 MED ORDER — MAGIC MOUTHWASH
5.0000 mL | Freq: Once | ORAL | Status: AC
  Administered 2018-11-26: 5 mL via ORAL
  Filled 2018-11-26: qty 5

## 2018-11-26 MED ORDER — INSULIN ASPART 100 UNIT/ML ~~LOC~~ SOLN
0.0000 [IU] | Freq: Three times a day (TID) | SUBCUTANEOUS | Status: DC
  Administered 2018-11-27: 3 [IU] via SUBCUTANEOUS
  Administered 2018-11-28 – 2018-11-29 (×3): 1 [IU] via SUBCUTANEOUS
  Administered 2018-11-30: 3 [IU] via SUBCUTANEOUS
  Administered 2018-12-03 (×2): 1 [IU] via SUBCUTANEOUS
  Administered 2018-12-04: 2 [IU] via SUBCUTANEOUS
  Administered 2018-12-05 (×2): 1 [IU] via SUBCUTANEOUS
  Administered 2018-12-06: 2 [IU] via SUBCUTANEOUS

## 2018-11-26 MED ORDER — ONDANSETRON HCL 4 MG PO TABS: 4.0000 mg | ORAL_TABLET | Freq: Four times a day (QID) | ORAL | Status: DC | PRN

## 2018-11-26 MED ORDER — ONDANSETRON HCL 4 MG/2ML IJ SOLN
4.0000 mg | Freq: Four times a day (QID) | INTRAMUSCULAR | Status: DC | PRN
  Administered 2018-11-28: 4 mg via INTRAVENOUS
  Filled 2018-11-26: qty 2

## 2018-11-26 MED ORDER — FENTANYL 50 MCG/HR TD PT72
1.0000 | MEDICATED_PATCH | TRANSDERMAL | Status: DC
  Administered 2018-11-29 – 2018-12-05 (×3): 1 via TRANSDERMAL
  Filled 2018-11-26 (×3): qty 1

## 2018-11-26 MED ORDER — SODIUM BICARBONATE 8.4 % IV SOLN
INTRAVENOUS | Status: DC
  Administered 2018-11-26 – 2018-11-28 (×4): via INTRAVENOUS
  Filled 2018-11-26 (×11): qty 850

## 2018-11-26 MED ORDER — ACETAMINOPHEN 325 MG PO TABS
650.0000 mg | ORAL_TABLET | Freq: Four times a day (QID) | ORAL | Status: DC | PRN
  Administered 2018-11-26 – 2018-12-05 (×7): 650 mg via ORAL
  Filled 2018-11-26 (×7): qty 2

## 2018-11-26 MED ORDER — NEPRO/CARBSTEADY PO LIQD: 237.0000 mL | Freq: Three times a day (TID) | ORAL | Status: DC | PRN

## 2018-11-26 MED ORDER — ASPIRIN 81 MG PO CHEW
81.0000 mg | CHEWABLE_TABLET | Freq: Every morning | ORAL | Status: DC
  Administered 2018-11-27 – 2018-12-06 (×9): 81 mg via ORAL
  Filled 2018-11-26 (×10): qty 1

## 2018-11-26 MED ORDER — SODIUM BICARBONATE 8.4 % IV SOLN
INTRAVENOUS | Status: DC
  Filled 2018-11-26 (×3): qty 1000

## 2018-11-26 MED ORDER — SORBITOL 70 % SOLN: 30.0000 mL | Status: DC | PRN

## 2018-11-26 MED ORDER — SODIUM CHLORIDE 0.9 % IV BOLUS
1000.0000 mL | Freq: Once | INTRAVENOUS | Status: AC
  Administered 2018-11-26: 1000 mL via INTRAVENOUS

## 2018-11-26 MED ORDER — PANTOPRAZOLE SODIUM 40 MG PO TBEC
40.0000 mg | DELAYED_RELEASE_TABLET | Freq: Two times a day (BID) | ORAL | Status: DC
  Administered 2018-11-26 – 2018-12-02 (×11): 40 mg via ORAL
  Filled 2018-11-26 (×14): qty 1

## 2018-11-26 MED ORDER — FLUTICASONE PROPIONATE 50 MCG/ACT NA SUSP
1.0000 | Freq: Every evening | NASAL | Status: DC
  Administered 2018-11-26 – 2018-12-04 (×8): 1 via NASAL
  Filled 2018-11-26 (×2): qty 16

## 2018-11-26 MED ORDER — ZOLPIDEM TARTRATE 5 MG PO TABS: 5.0000 mg | ORAL_TABLET | Freq: Every evening | ORAL | Status: DC | PRN

## 2018-11-26 MED ORDER — ACETAMINOPHEN 650 MG RE SUPP: 650.0000 mg | Freq: Four times a day (QID) | RECTAL | Status: DC | PRN

## 2018-11-26 NOTE — H&P (Addendum)
History and Physical    [name]:[id number] DOB: 08/11/1956 DOA: 11/26/2018  PCP: Vidal Schwalbe, MD Consultants:  Delton Coombes - oncology Patient coming from:  Home - lives with [place]; Fred Hernandez: Fred Hernandez [phone]   Chief Complaint: Abdominal pain  HPI: Fred Hernandez is a 63 y.o. 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  He came to the ER because he couldn't pee.  He hasn't been able to eat and drink.  He peed once today.  His girlfriend has been getting on him - he has been declining in his PO intake x 3-4 weeks.  He just suddenly didn't feel like eating - despite previously eating 4 times a day.  He was 148 in 2/19; he was 109 the last time he was weighed at chemo.  +tobacco - 1/2 ppd.  Severe anorexia, no nausea.  He doesn't like the Marinol because it makes him feel high and so he won't take them.  He doesn't have difficulty with urination other than small and infrequent voids.  No fever.  Mild cough.   ED Course:  Decreased PO since Friday - not eating/drinking.  Creatinine 1.28 -> 9.31.  Bladder scan with 100 cc.  He received 2L IVFs.  Review of Systems: As per HPI; otherwise review of systems reviewed and negative.   Ambulatory Status:  Ambulates without assistance  Past Medical History:  Diagnosis Date  . Bursitis   . CAD (coronary artery disease)     s/p stent placement unknown artery 12/2014,  . COPD (chronic obstructive pulmonary disease) ([place])   . Diabetes mellitus ([place])   . GERD (gastroesophageal reflux disease)   . History of prostate cancer   . Hyperlipemia   . Hypertension   . Lung cancer ([place]) 12/2017   still having chemo - last was 2 weeks ago  . Peripheral arterial disease ([place])    a. h/o stents. b. 03/2016  - successful PTA and covered stenting using overlapping Viabahn covered stents of a long segment  in-stent restenosis of previously placed nitinol self expanding stents in the proximal mid and distal right SFA.  . Tobacco abuse     Past Surgical History:  Procedure Laterality Date  . COLONOSCOPY WITH PROPOFOL N/A 11/22/2016   Procedure: COLONOSCOPY WITH PROPOFOL;  Surgeon: [name], MD;  Location[facility];  Service: Endoscopy;  Laterality: N/A;  10:00 am  . CORONARY STENT PLACEMENT  12-2014   [facility] [employer]  . FEMORAL ARTERY STENT Bilateral 03/2013  . PERIPHERAL VASCULAR CATHETERIZATION N/A 04/04/2016   Procedure: Lower Extremity Angiography;  Surgeon: Lorretta Harp, MD;  Location: [place] [facility];  Service: Cardiovascular;  Laterality: N/A;  . PERIPHERAL VASCULAR CATHETERIZATION N/A 04/04/2016   Procedure: Abdominal Aortogram;  Surgeon: Lorretta Harp, MD;  Location: [place] [facility];  Service: Cardiovascular;  Laterality: N/A;  . PERIPHERAL VASCULAR CATHETERIZATION  04/04/2016   Procedure: Peripheral Vascular Intervention;  Surgeon: Lorretta Harp, MD;  Location: [place] [facility];  Service: Cardiovascular;;  rt SFA stent  . POLYPECTOMY  11/22/2016   Procedure: POLYPECTOMY;  Surgeon: [name], MD;  Location[facility];  Service: Endoscopy;;  descending colon polyp, transverse colon polyp, sigmoid colon polyp, rectal polyp  . PORTACATH PLACEMENT Left 02/19/2018   Procedure: INSERTION PORT-A-CATH;  Surgeon: Aviva Signs, MD;  Location: [facility] OR[facility];  Service: General;  Laterality: Left;  . STENT PLACEMENT VASCULAR ([place] HX)  2013    Social History   Socioeconomic History  . Marital status: Single    Spouse name: Not on file  . Number of children: Not on file  . Years of education: Not on file  . Highest education level: Not on file  Occupational History  . Occupation: unemployed  Social Needs  . Financial resource strain: Not on file  . Food insecurity:    Worry: Not on file    Inability: Not on file  . Transportation needs:     Medical: Not on file    Non-medical: Not on file  Tobacco Use  . Smoking status: Current Every Day Smoker    Packs/day: 0.25    Years: 45.00    Pack years: 11.25    Types: Cigarettes    Start date: 02/12/1971  . Smokeless tobacco: Never Used  Substance and Sexual Activity  . Alcohol use: Not Currently    Alcohol/week: 0.0 standard drinks  . Drug use: No  . Sexual activity: Yes  Lifestyle  . Physical activity:    Days per week: Not on file    Minutes per session: Not on file  . Stress: Not on file  Relationships  . Social connections:    Talks on phone: Not on file    Gets together: Not on file    Attends religious service: Not on file    Active member of club or organization: Not on file    Attends meetings of clubs or organizations: Not on file    Relationship status: Not on file  . Intimate partner violence:    Fear of current or ex partner: Not on file    Emotionally abused: Not on file    Physically abused: Not on file    Forced sexual activity: Not on file  Other Topics Concern  . Not on file  Social History Narrative  . Not on file    Allergies  Allergen Reactions  . Lisinopril Anaphylaxis  . Penicillins Anaphylaxis    Has patient had a PCN reaction causing immediate rash, facial/tongue/throat swelling, SOB or lightheadedness with hypotension:Yes Has patient had a PCN reaction causing severe rash involving mucus membranes or skin necrosis:unsure Has patient had a PCN reaction that required hospitalization:unsure Has patient had a PCN reaction occurring within the last 10 years:~10 years per patient If all of the above answers are "NO", then may proceed with Cephalosporin use.   . Ace Inhibitors Swelling    Family History  Problem Relation Age of Onset  . Diabetes Mother   . Heart disease Mother   . Hypertension Mother   . Healthy Brother   . Other Brother        accident   . Arthritis Sister   . Arthritis Sister   . SIDS Brother   . Colon cancer Neg  Hx     Prior to Admission medications   Medication Sig Start Date End Date Taking? Authorizing Provider  albuterol (PROAIR HFA) 108 (90 Base) MCG/ACT inhaler Inhale 1-2 puffs into the lungs every 6 (six) hours as needed for wheezing or shortness of breath.   Yes [provider]  aspirin 81 MG chewable tablet Chew 81 mg by mouth every morning.    Yes [provider]  atorvastatin (LIPITOR) 80 MG tablet Take 80 mg by mouth at bedtime.    Yes [provider]  CARBOPLATIN IV  Inject into the vein every 21 ( twenty-one) days.    Yes [provider]  cilostazol (PLETAL) 50 MG tablet TAKE 1 TABLET(50 MG) BY MOUTH TWICE DAILY Patient taking differently: Take 50 mg by mouth 2 (two) times daily.  08/09/18  Yes Lorretta Harp, MD  Coenzyme Q10 100 MG capsule Take 100 mg by mouth at bedtime.    Yes [provider]  Diphenhyd-Hydrocort-Nystatin (FIRST-DUKES MOUTHWASH) SUSP Use as directed 5 mLs in the mouth or throat 4 (four) times daily -  before meals and at bedtime. 11/16/18  Yes Derek Jack, MD  doxycycline (VIBRAMYCIN) 100 MG capsule Take 100 mg by mouth 2 (two) times daily. 10 day course starting on 11/22/2018 11/21/18  Yes [provider]  fentaNYL (DURAGESIC - DOSED MCG/HR) 50 MCG/HR Place 1 patch (50 mcg total) onto the skin every 3 (three) days. 11/12/18  Yes Lockamy, Randi L, NP-C  fluticasone (FLONASE) 50 MCG/ACT nasal spray Place 1 spray into both nostrils every evening.    Yes [provider]  Gemcitabine HCl (GEMZAR IV) Inject into the vein. Day 1 day 8 every 21 days.   Yes [provider]  HYDROmorphone (DILAUDID) 2 MG tablet Take 2 tablets (4 mg total) by mouth every 2 (two) hours as needed for severe pain. Patient taking differently: Take 4 mg by mouth every 2 (two) hours.  11/15/18  Yes Lockamy, Randi L, NP-C  ipratropium-albuterol (DUONEB) 0.5-2.5 (3) MG/3ML SOLN Take 3 mLs by nebulization every 6 (six) hours as  needed (for shortness of breath).   Yes [provider]  lidocaine-prilocaine (EMLA) cream Apply a quarter size amount to affected area 1 hour prior to coming to chemotherapy.  Do not rub in.  Cover with plastic wrap. 01/30/18  Yes Derek Jack, MD  metFORMIN (GLUCOPHAGE) 500 MG tablet Take 500 mg by mouth 2 (two) times daily with a meal.    Yes [provider]  metoprolol succinate (TOPROL-XL) 50 MG 24 hr tablet Take 50 mg by mouth daily.  02/08/16  Yes [provider]  Omega-3 Fatty Acids (FISH OIL) 1000 MG CAPS Take 1 capsule by mouth at bedtime.    Yes [provider]  ondansetron (ZOFRAN) 8 MG tablet Take 1 tablet (8 mg total) by mouth every 8 (eight) hours as needed for nausea or vomiting. 01/30/18  Yes Derek Jack, MD  pantoprazole (PROTONIX) 40 MG tablet Take 40 mg by mouth 2 (two) times daily.   Yes [provider]  predniSONE (DELTASONE) 20 MG tablet Take 20-40 mg by mouth See admin instructions. Take 40mg  on day 1 then take 20 mg until gone starting on 11/22/2018. (#13 tablets dispensed) 11/21/18  Yes [provider]  prochlorperazine (COMPAZINE) 10 MG tablet Take 1 tablet (10 mg total) by mouth every 6 (six) hours as needed for nausea or vomiting. 01/30/18  Yes Derek Jack, MD    Physical Exam: Vitals:   11/26/18 1700 11/26/18 1730 11/26/18 1800 11/26/18 1830  BP: 114/71 101/61 117/73 115/72  Pulse: 87 83 84 83  Resp: (!) 22 14 17 14   SpO2: 100% 100% 100% 99%  Weight:      Height:         . General:  Appears calm and comfortable and is NAD; he is cachectic . Eyes:  PERRL, EOMI, normal lids, iris . ENT:  grossly normal hearing, lips & tongue with oropharyngeal thrush, dry mm . Neck:  no LAD, masses or thyromegaly . Cardiovascular:  RRR,  no m/r/g. No LE edema.  [name] Respiratory:   CTA bilaterally with no wheezes/rales/rhonchi.  Normal respiratory effort. . Abdomen:  soft, NT, ND, NABS . Skin:  no rash or  induration seen on limited exam . Musculoskeletal:  grossly normal tone BUE/BLE, good ROM, no bony abnormality . Psychiatric:  grossly normal mood and affect, speech fluent and appropriate, AOx3 . Neurologic:  CN 2-12 grossly intact, moves all extremities in coordinated fashion, sensation intact    Radiological Exams on Admission: Dg Chest 2 View  Result Date: 11/26/2018 CLINICAL DATA:  Shortness of breath, weakness, history prostate cancer, lung cancer EXAM: CHEST TWO VIEWS COMPARISON:  Chest radiograph 02/19/2018, PET-CT 08/30/2018 FINDINGS: LEFT subclavian Port-A-Cath with tip projecting over SVC. Normal heart size, mediastinal contours, and pulmonary vascularity. Chronic pleuroparenchymal opacity at the RIGHT apex associated with a cavitary focus versus loculated pneumothorax unchanged from PET-CT. Bone destruction of the lateral RIGHT second and posterior RIGHT third ribs again identified. Underlying emphysematous changes. Question LEFT nipple shadow. No definite acute infiltrate, pleural effusion or pneumothorax. IMPRESSION: Known tumor at RIGHT apex with bone destruction of the RIGHT second and third ribs as well as a chronic cavitary focus versus loculated pneumothorax at RIGHT apex. Emphysematous changes with question LEFT nipple shadow; repeat PA chest radiograph with nipple markers recommended to exclude pulmonary nodule. Electronically Signed   By: Lavonia Dana M.D.   On: 11/26/2018 15:14    EKG: not done   Labs on Admission: I have personally reviewed the available labs and imaging studies at the time of the admission.  Pertinent labs:   Na++ 127 K+ 5.3 CO2 7 BUN 100/Creatinine 9.31/GFR 6; prior 20/1.28/>60 on 1/17 Anion gap >20 Albumin 2.8 Troponin <0.03 WBC 14.8 Hgb 9.1 - stable Platelets 518   Assessment/Plan Principal Problem:   Acute renal failure (ARF) (HCC) Active Problems:   Essential hypertension   Diabetes mellitus with circulatory complication (HCC)    Tobacco abuse   Squamous cell carcinoma of right lung (HCC)   Anemia associated with chemotherapy   Acute renal failure -Baseline creatinine is <1. -Creatinine on 1/17 was 1.28 and today is 9.31.   -Likely due to prerenal failure secondary to dehydration and inability to tolerate PO as well as continuation of Metformin and recent rx for prednisone (also doxy - likely COPD exacerbation, although he does not have any apparent symptoms at this time and so these medications have been discontinued). -There is also an apparent known risk associated with tumor blocking agents such as Durvalumab.  This adverse reaction may or may not be reversible. -Patient discussed with Dr. Jimmy Footman. -Due to extremely low bicarb, will bolus with 2 amps HCO3 now and then start D5 with 3 amps of HCO3 at 150 cc/hour. -Follow up renal function by BMP -Avoid ACEI and NSAIDs -If not improving in AM, nephrology will be at [facility] tomorrow AM and  should be reconsulted at that time. -Nephrology prn order set was used. -Will order renal US.  Lung CA -NSCLC diagnosed in 2/19 -He was treated with chemoradiation with carboplatin and paclitaxel from 4-03/2018 with some tumor regression. -He was then treated in 7/19 with Durvalumab. -He had progressive disease with chest wall invasion in 10/19 and received one dose of palliative radiation on 12/17 and then was started on gemcitabine and carboplatin. -Due to increasing calcium and creatinine, his therapy was held on 1/17. -He is being treated with a fentanyl patch (50 mcg) as well as Dilaudid 2 mg PO q2h, which has  been continued. -His calcium is normal/low by today's BMP. -He has thrush, which is being treated with [facility] mouthwash. -Will order repeat CXR with nipple marker, as per radiology recommendation.  Cachexia -Patient was previously on a medication (?Marinol) for this but stopped taking it because he didn't like how it made his feel "high" - this in spite of using  daily Fentanyl patches and PO Dilaudid q2h -Suggest consideration of Remeron once more clinically stable  HTN -Continue Toprol XL.  DM -Will check A1c; good control as of 01/2018 -hold Glucophage -Cover with sensitive-scale SSI  Anemia -Appears to be stable at this time.  Tobacco dependence -Encourage cessation.  I was very frank with the patient and his girlfriend that they really must both stop smoking. -This was discussed with the patient and should be reviewed on an ongoing basis.   -Patch ordered at patient request.    DVT prophylaxis: Lovenox Code Status:  Full - confirmed with patient/family Family Communication: Fred Hernandez present throughout evaluation  Disposition Plan:  Home once clinically improved Consults called: Nephrology by telephone only  Admission status: Admit - It is my clinical opinion that admission to INPATIENT is reasonable and necessary because of the expectation that this patient will require hospital care that crosses at least 2 midnights to treat this condition based on the medical complexity of the problems presented.  Given the aforementioned information, the predictability of an adverse outcome is felt to be significant.    Karmen Bongo MD [facility]  If note is complete, please contact covering daytime or nighttime physician. [email or web address]   11/26/2018, 7:21 PM

## 2018-11-26 NOTE — ED Triage Notes (Signed)
Pt went to pcp today at [facility] for eval of n/v.    Office started IV and gave 1liter of ivf.  EMS reports pt afebrile, cbg 119.  EMS says pt receives cancer treatments here at [facility].

## 2018-11-26 NOTE — ED Provider Notes (Signed)
Emergency Department Provider Note   I have reviewed the triage vital signs and the nursing notes.   HISTORY  Chief Complaint Abdominal Pain   HPI Fred Hernandez is a 63 y.o. 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  Patient arrived initially to urgent care and was given 1 L IV fluids.  EMS was called.  Reported to them that he had not had anything to eat or drink in the past 2 days.  He is experiencing a nausea but no abdominal pain or back pain.  He has not produced urine in the last 24 hours.  Feeling increasingly fatigued and continuing to have pain in his upper back.  He has been using Dilaudid, fentanyl patches, lidocaine patches.  He has followed primarily by Dr. Delton Coombes with oncology. No fever/chills. No blood in the BM or diarrhea. Stools are infrequent.  Past Medical History:  Diagnosis Date  . Bursitis   . CAD (coronary artery disease)     s/p stent placement unknown artery 12/2014,  . Cancer [facility])    prostate cancer  . COPD (chronic obstructive pulmonary disease) ([place])   . Diabetes mellitus ([place])   . GERD (gastroesophageal reflux disease)   . History of prostate cancer   . Hyperlipemia   . Hypertension   . Peripheral arterial disease ([place])    a. h/o stents. b. 03/2016  - successful PTA and covered stenting using overlapping Viabahn covered stents of a [place]oke Payes segment in-stent restenosis of previously placed nitinol self expanding stents in the proximal mid and distal right SFA.  . Tobacco abuse     Patient Active Problem List   Diagnosis Date Noted  . Goals of care, counseling/discussion 10/04/2018  . History of prostate cancer 06/26/2018  . Squamous cell carcinoma of right lung ([place]) 02/20/2018  . 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  History of colonic polyps 09/20/2016  . Essential hypertension 04/05/2016  . Hyperlipidemia 04/05/2016  . Diabetes mellitus with circulatory complication ([place]) 32/99/2426  . Tobacco abuse 04/05/2016  . Claudication ([place]) 04/04/2016  . Peripheral arterial disease ([place]) 03/16/2016  . Left shoulder pain 12/23/2015    Past Surgical History:  Procedure Laterality Date  . COLONOSCOPY WITH PROPOFOL N/A 11/22/2016   Procedure: COLONOSCOPY WITH PROPOFOL;  Surgeon: [name], MD;  [facility];  Service: Endoscopy;  Laterality: N/A;  10:00 am  . CORONARY STENT PLACEMENT  12-2014   [facility] [employer]  . FEMORAL ARTERY STENT Bilateral 03/2013  . PERIPHERAL VASCULAR CATHETERIZATION N/A 04/04/2016   Procedure: Lower Extremity Angiography;  Surgeon: Lorretta Harp, MD;  Location: [place] [facility];  Service: Cardiovascular;  Laterality: N/A;  . PERIPHERAL VASCULAR CATHETERIZATION N/A 04/04/2016   Procedure: Abdominal Aortogram;  Surgeon: Lorretta Harp, MD;  Location: [place] [facility] LAB;  Service: Cardiovascular;  Laterality: N/A;  . PERIPHERAL VASCULAR CATHETERIZATION  04/04/2016   Procedure: Peripheral Vascular Intervention;  Surgeon: Lorretta Harp, MD;  Location: [place] [facility] LAB;  Service: Cardiovascular;;  rt SFA stent  . POLYPECTOMY  11/22/2016   Procedure: POLYPECTOMY;  Surgeon: [name], MD;  [facility];  Service: Endoscopy;;  descending colon polyp, transverse colon polyp, sigmoid colon polyp, rectal polyp  . PORTACATH PLACEMENT Left 02/19/2018  Procedure: INSERTION PORT-A-CATH;  Surgeon: Aviva Signs, MD;  Location: [facility];  Service: General;  Laterality: Left;  . STENT PLACEMENT VASCULAR ([place] HX)  2013   Allergies Lisinopril; Penicillins; and Ace inhibitors  Family History  Problem Relation Age of Onset  . Diabetes Mother   . Heart disease Mother   . Hypertension Mother   . Healthy Brother   . Other Brother        accident   .  Arthritis Sister   . Arthritis Sister   . SIDS Brother   . Colon cancer Neg Hx     Social History Social History   Tobacco Use  . Smoking status: Current Every Day Smoker    Packs/day: 0.25    Years: 45.00    Pack years: 11.25    Types: Cigarettes    Start date: 02/12/1971  . Smokeless tobacco: Never Used  Substance Use Topics  . Alcohol use: Not Currently    Alcohol/week: 0.0 standard drinks  . Drug use: No    Review of Systems  Constitutional: No fever/chills. Positive generalized weakness.  Eyes: No visual changes. ENT: No sore throat. Cardiovascular: Denies chest pain. Respiratory: Denies shortness of breath. Gastrointestinal: negative abdominal pain. Positive nausea and vomiting.  No diarrhea.  No constipation. Genitourinary: No urination in the last 24 hours.  Musculoskeletal: Negative for back pain. Skin: Negative for rash. Neurological: Negative for headaches, focal weakness or numbness.  10-point ROS otherwise negative.  ____________________________________________   PHYSICAL EXAM:  VITAL SIGNS: ED Triage Vitals  Enc Vitals Group     BP 11/26/18 1339 105/62     Pulse Rate 11/26/18 1339 80     Resp 11/26/18 1339 18     Temp --      Temp src --      SpO2 11/26/18 1339 100 %     Weight 11/26/18 1337 103 lb 9.9 oz (47 kg)     Height 11/26/18 1337 5\' 9"  (1.753 m)   Constitutional: Alert and oriented. Thin and chronically ill-appearing.  Eyes: Conjunctivae are normal.  Head: Atraumatic. Nose: No congestion/rhinnorhea. Mouth/Throat: Mucous membranes are moist.  Neck: No stridor.  Cardiovascular: Normal rate, regular rhythm. Good peripheral circulation. Grossly normal heart sounds.   Respiratory: Normal respiratory effort. No retractions. Lungs CTAB. Gastrointestinal: Soft and nontender. No distention.  Musculoskeletal: No lower extremity tenderness nor edema. No gross deformities of extremities. Neurologic:  Normal speech and language. No gross focal  neurologic deficits are appreciated.  Skin:  Skin is warm, dry and intact. No rash noted.  ____________________________________________   LABS (all labs ordered are listed, but only abnormal results are displayed)  Labs Reviewed  URINE CULTURE  COMPREHENSIVE METABOLIC PANEL  LIPASE, BLOOD  CBC WITH DIFFERENTIAL/PLATELET  TROPONIN I  URINALYSIS, ROUTINE W REFLEX MICROSCOPIC   ____________________________________________  RADIOLOGY  Dg Chest 2 View  Result Date: 11/26/2018 CLINICAL DATA:  Shortness of breath, weakness, history prostate cancer, lung cancer EXAM: CHEST TWO VIEWS COMPARISON:  Chest radiograph 02/19/2018, PET-CT 08/30/2018 FINDINGS: LEFT subclavian Port-A-Cath with tip projecting over SVC. Normal heart size, mediastinal contours, and pulmonary vascularity. Chronic pleuroparenchymal opacity at the RIGHT apex associated with a cavitary focus versus loculated pneumothorax unchanged from PET-CT. Bone destruction of the lateral RIGHT second and posterior RIGHT third ribs again identified. Underlying emphysematous changes. Question LEFT nipple shadow. No definite acute infiltrate, pleural effusion or pneumothorax. IMPRESSION: Known tumor at RIGHT apex with bone destruction of the RIGHT second and third ribs as well as  a chronic cavitary focus versus loculated pneumothorax at RIGHT apex. Emphysematous changes with question LEFT nipple shadow; repeat PA chest radiograph with nipple markers recommended to exclude pulmonary nodule. Electronically Signed   By: Lavonia Dana M.D.   On: 11/26/2018 15:14    ____________________________________________   PROCEDURES  Procedure(s) performed:   Procedures  None  ____________________________________________   INITIAL IMPRESSION / ASSESSMENT AND PLAN / ED COURSE  Pertinent labs & imaging results that were available during my care of the patient were reviewed by me and considered in my medical decision making (see chart for  details).  Patient with known, locally invasive squamous cell carcinoma of the right upper lobe presents with nausea, vomiting, decreased oral intake, and no urine output in the past 24 hours.  Abdomen is diffusely soft and non-tender to palpation. No suprapubic tenderness to suspect urinary retention.   111 ml on bladder scan. CXR with old findings on CXR. Labs pending. Suspect possible AKI and anticipating electrolyte abnormalities. Case transferred to Dr. Roderic Palau who will follow labs and reassess.  ____________________________________________  FINAL CLINICAL IMPRESSION(S) / ED DIAGNOSES  Final diagnoses:  Dehydration  Non-intractable vomiting with nausea, unspecified vomiting type     MEDICATIONS GIVEN DURING THIS VISIT:  Medications  sodium chloride 0.9 % bolus 1,000 mL (1,000 mLs Intravenous New Bag/Given 11/26/18 1457)    Note:  This document was prepared using Dragon voice recognition software and may include unintentional dictation errors.  Nanda Quinton, MD Emergency Medicine    Quita Mcgrory, [facility], MD 11/26/18 [phone]

## 2018-11-26 NOTE — Progress Notes (Signed)
Patient's significant other called today stating that patient has been vomiting all night, has not had a bowel movement in a while, has been able to drink ensure but not able to eat any food at all for the past few days. She states that he is running a low grade temperature 100.1.  She has not given him his antibiotic dose this morning and he hasn't thrown up yet.    I advised her to take him to the emergency room for evaluation. She states that she just wants to take him to the urgent care because it is too far of a drive to the ER here at [facility].  I told her that he would get a better evaluation at the ER but if she doesn't comply and does take him to urgent care to leave him in the car so that he isn't exposed to the contagions in the waiting rooms.  I told her to have him wear a mask with either place.  She states that it will take her a while to get him ready but she will bring him to the ER.

## 2018-11-27 ENCOUNTER — Inpatient Hospital Stay (HOSPITAL_COMMUNITY): Payer: Medicare Other

## 2018-11-27 DIAGNOSIS — E1159 Type 2 diabetes mellitus with other circulatory complications: Secondary | ICD-10-CM

## 2018-11-27 DIAGNOSIS — Z720 Tobacco use: Secondary | ICD-10-CM

## 2018-11-27 DIAGNOSIS — R112 Nausea with vomiting, unspecified: Secondary | ICD-10-CM

## 2018-11-27 DIAGNOSIS — E860 Dehydration: Secondary | ICD-10-CM

## 2018-11-27 DIAGNOSIS — N178 Other acute kidney failure: Secondary | ICD-10-CM

## 2018-11-27 DIAGNOSIS — C3491 Malignant neoplasm of unspecified part of right bronchus or lung: Secondary | ICD-10-CM

## 2018-11-27 DIAGNOSIS — I10 Essential (primary) hypertension: Secondary | ICD-10-CM

## 2018-11-27 LAB — URINALYSIS, ROUTINE W REFLEX MICROSCOPIC
Bilirubin Urine: NEGATIVE
Glucose, UA: 50 mg/dL — AB
KETONES UR: NEGATIVE mg/dL
Leukocytes, UA: NEGATIVE
Nitrite: NEGATIVE
PROTEIN: 100 mg/dL — AB
Specific Gravity, Urine: 1.01 (ref 1.005–1.030)
WBC, UA: >50 WBC/hpf — ABNORMAL HIGH (ref 0–5)
pH: 5 (ref 5.0–8.0)

## 2018-11-27 LAB — CBC
HCT: 24.3 % — ABNORMAL LOW (ref 39.0–52.0)
Hemoglobin: 8 g/dL — ABNORMAL LOW (ref 13.0–17.0)
MCH: 26.5 pg (ref 26.0–34.0)
MCHC: 32.9 g/dL (ref 30.0–36.0)
MCV: 80.5 fL (ref 80.0–100.0)
NRBC: 0 % (ref 0.0–0.2)
PLATELETS: 471 10*3/uL — AB (ref 150–400)
RBC: 3.02 MIL/uL — AB (ref 4.22–5.81)
RDW: 16.1 % — ABNORMAL HIGH (ref 11.5–15.5)
WBC: 12.9 10*3/uL — AB (ref 4.0–10.5)

## 2018-11-27 LAB — BASIC METABOLIC PANEL
Anion gap: >20 — ABNORMAL HIGH (ref 5–15)
BUN: 66 mg/dL — ABNORMAL HIGH (ref 8–23)
BUN: 116 mg/dL — AB (ref 8–23)
CO2: 18 mmol/L — ABNORMAL LOW (ref 22–32)
CO2: 22 mmol/L (ref 22–32)
Calcium: 5.9 mg/dL — CL (ref 8.9–10.3)
Calcium: 5.7 mg/dL — CL (ref 8.9–10.3)
Chloride: 85 mmol/L — ABNORMAL LOW (ref 98–111)
Chloride: 77 mmol/L — ABNORMAL LOW (ref 98–111)
Creatinine, Ser: 8.97 mg/dL — ABNORMAL HIGH (ref 0.61–1.24)
Creatinine, Ser: 9.2 mg/dL — ABNORMAL HIGH (ref 0.61–1.24)
GFR calc Af Amer: 6 mL/min — ABNORMAL LOW (ref >60)
GFR calc non Af Amer: 6 mL/min — ABNORMAL LOW (ref >60)
GFR calc non Af Amer: 5 mL/min — ABNORMAL LOW (ref >60)
GFR, EST AFRICAN AMERICAN: 7 mL/min — AB (ref >60)
Glucose, Bld: 149 mg/dL — ABNORMAL HIGH (ref 70–99)
Glucose, Bld: 168 mg/dL — ABNORMAL HIGH (ref 70–99)
Potassium: 4.6 mmol/L (ref 3.5–5.1)
Potassium: 3.6 mmol/L (ref 3.5–5.1)
SODIUM: 129 mmol/L — AB (ref 135–145)
Sodium: 125 mmol/L — ABNORMAL LOW (ref 135–145)

## 2018-11-27 LAB — GLUCOSE, CAPILLARY
GLUCOSE-CAPILLARY: 138 mg/dL — AB (ref 70–99)
Glucose-Capillary: 125 mg/dL — ABNORMAL HIGH (ref 70–99)
Glucose-Capillary: 206 mg/dL — ABNORMAL HIGH (ref 70–99)
Glucose-Capillary: 115 mg/dL — ABNORMAL HIGH (ref 70–99)

## 2018-11-27 LAB — HEMOGLOBIN A1C
Hgb A1c MFr Bld: 7.2 % — ABNORMAL HIGH (ref 4.8–5.6)
Mean Plasma Glucose: 159.94 mg/dL

## 2018-11-27 MED ORDER — CHLORHEXIDINE GLUCONATE 0.12 % MT SOLN
15.0000 mL | Freq: Two times a day (BID) | OROMUCOSAL | Status: DC
  Administered 2018-11-27 – 2018-12-06 (×11): 15 mL via OROMUCOSAL
  Filled 2018-11-27 (×17): qty 15

## 2018-11-27 MED ORDER — CALCIUM GLUCONATE-NACL 1-0.675 GM/50ML-% IV SOLN
1.0000 g | Freq: Two times a day (BID) | INTRAVENOUS | Status: AC
  Administered 2018-11-28 (×2): 1000 mg via INTRAVENOUS
  Filled 2018-11-27 (×2): qty 50

## 2018-11-27 MED ORDER — ORAL CARE MOUTH RINSE
15.0000 mL | Freq: Two times a day (BID) | OROMUCOSAL | Status: DC
  Administered 2018-11-27 – 2018-12-05 (×6): 15 mL via OROMUCOSAL

## 2018-11-27 MED ORDER — BOOST / RESOURCE BREEZE PO LIQD CUSTOM
1.0000 | Freq: Three times a day (TID) | ORAL | Status: DC
  Administered 2018-11-27 – 2018-11-29 (×2): 1 via ORAL

## 2018-11-27 MED ORDER — CALCIUM GLUCONATE-NACL 1-0.675 GM/50ML-% IV SOLN
1.0000 g | Freq: Once | INTRAVENOUS | Status: AC
  Administered 2018-11-27: 1000 mg via INTRAVENOUS
  Filled 2018-11-27: qty 50

## 2018-11-27 MED ORDER — HEPARIN SODIUM (PORCINE) 5000 UNIT/ML IJ SOLN
5000.0000 [IU] | Freq: Three times a day (TID) | INTRAMUSCULAR | Status: DC
  Administered 2018-11-28 – 2018-12-06 (×18): 5000 [IU] via SUBCUTANEOUS
  Filled 2018-11-27 (×22): qty 1

## 2018-11-27 NOTE — Progress Notes (Signed)
RN paged Tylene Fantasia, NP to make her aware patient is not feeling well and is refusing all medication at this time including pain and nausea medication.  RN will continue to monitor patient and make MD aware of any changes.  P.J. Linus Mako, RN

## 2018-11-27 NOTE — Progress Notes (Signed)
Critical calcium 5.7.  Texted Dr. Dyann Kief

## 2018-11-27 NOTE — Progress Notes (Signed)
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 Patient came to the ED due to inability to urinate. His oral intake has been poor and progressively decreasing. Family at bedside reported he was vomiting at home and spitting while in the hospital. Denies nausea, fevers, chest pain, SOB, abdominal discomfort, melena, hematochezia.   Assessment & Plan:  Acute renal failure -Creatinine yesterday was 9.31, today is 8.97 -Likely due to prerenal failure secondary to dehydration and poor oral intake. -renal US also demonstrated distended bladder, but no hydronephrosis -Continue following up renal function panel -continue IVF's -will place foley -nephrology service will follow up as needed base on his response to fluids.  Lung CA/with chronic pain -NSCLC diagnosed in 2/19 -Continue fentanyl patch (50 mcg) as well as Dilaudid 2 mg PO q2h. -Continue [facility] mouthwash for thrush -will hold chemotherapy while inpatient -tobacco cessation encouraged -after discussing with family and in the setting of further decline in his health and quality will request palliative care consult.  Cachexia -Suggest consideration of Remeron once clinically stable and proven to tolerate PO's  HTN -Continue Toprol XL. -BP stable currently  DM -Continue following glucose trend -Continue holding Glucophage while inpatient -will use SSI  Anemia -stable -will follow trend   Tobacco dependence -I have discussed tobacco cessation with the patient.  I have counseled the patient regarding the negative impacts of continued tobacco use including but not limited to lung cancer, COPD, and cardiovascular disease.  I have discussed alternatives to tobacco and modalities that may  help facilitate tobacco cessation including but not limited to biofeedback, hypnosis, and medications.  Total time spent with tobacco counseling was 4 minutes. -Continue the use of nicotine patch  Hypocalcemia -will give three doses of calcium gluconate -follow trend    DVT prophylaxis: heparin  Code Status: FULL Family Communication: Fiancee and sisters at bedside Disposition Plan: home once clinically improved  Consultants:   Nephrology   Palliative Care  Procedures:   None   Antimicrobials:  Anti-infectives (From admission, onward)   None      Subjective: Very poor appetite, decreased urinary output. Afebrile, no nausea or vomiting at this time, no chest pain, no abdominal pain.  Objective: Vitals:   11/26/18 1830 11/26/18 1930 11/26/18 2057 11/27/18 0713  BP: 115/72 108/62 113/77 108/69  Pulse: 83 91 86 91  Resp: 14 16 20 16   Temp:   98 F (36.7 C) 97.6 F (36.4 C)  TempSrc:   Oral Oral  SpO2: 99% 100% 100% 100%  Weight:   49.3 kg   Height:   5\' 9"  (1.753 m)     Intake/Output Summary (Last 24 hours) at 11/27/2018 1523 Last data filed at 11/27/2018 0630 Gross per 24 hour  Intake 2292.44 ml  Output -  Net 2292.44 ml   Filed Weights   11/26/18 1337 11/26/18 2057  Weight: 47 kg 49.3 kg    Examination: General exam: Cachectic. Alert, awake, oriented x 3.  Respiratory system: Clear to auscultation. Respiratory effort normal. Cardiovascular system:RRR. No murmurs, rubs, gallops. Gastrointestinal system: Abdomen is nondistended, soft and nontender. No organomegaly or masses felt. Normal bowel sounds heard. Extremities: No C/C/E, +pedal pulses Skin: No rashes, lesions or ulcers Psychiatry: Judgement and insight  appear normal. Mood & affect appropriate.   Data Reviewed: I have personally reviewed following labs and imaging studies  CBC: Recent Labs  Lab 11/26/18 1550 11/27/18 0415  WBC 14.8* 12.9*  NEUTROABS 12.9*  --   HGB 9.1* 8.0*  HCT 29.7*  24.3*  MCV 85.6 80.5  PLT 518* 229*   Basic Metabolic Panel: Recent Labs  Lab 11/26/18 1550 11/27/18 0415  NA 127* 129*  K 5.3* 4.6  CL 85* 85*  CO2 7* 18*  GLUCOSE 102* 149*  BUN 100* 66*  CREATININE 9.31* 8.97*  CALCIUM 6.9* 5.9*   GFR: Estimated Creatinine Clearance: 6 mL/min (A) (by C-G formula based on SCr of 8.97 mg/dL (H)).   Liver Function Tests: Recent Labs  Lab 11/26/18 1550  AST 36  ALT 37  ALKPHOS 82  BILITOT 0.6  PROT 7.3  ALBUMIN 2.8*   Recent Labs  Lab 11/26/18 1550  LIPASE 21   Cardiac Enzymes: Recent Labs  Lab 11/26/18 1550  TROPONINI <0.03   HbA1C: Recent Labs    11/26/18 1550  HGBA1C 7.2*   CBG: Recent Labs  Lab 11/27/18 0802 11/27/18 1130  GLUCAP 138* 125*    Radiology Studies: Dg Chest 2 View  Result Date: 11/26/2018 CLINICAL DATA:  Shortness of breath, weakness, history prostate cancer, lung cancer EXAM: CHEST TWO VIEWS COMPARISON:  Chest radiograph 02/19/2018, PET-CT 08/30/2018 FINDINGS: LEFT subclavian Port-A-Cath with tip projecting over SVC. Normal heart size, mediastinal contours, and pulmonary vascularity. Chronic pleuroparenchymal opacity at the RIGHT apex associated with a cavitary focus versus loculated pneumothorax unchanged from PET-CT. Bone destruction of the lateral RIGHT second and posterior RIGHT third ribs again identified. Underlying emphysematous changes. Question LEFT nipple shadow. No definite acute infiltrate, pleural effusion or pneumothorax. IMPRESSION: Known tumor at RIGHT apex with bone destruction of the RIGHT second and third ribs as well as a chronic cavitary focus versus loculated pneumothorax at RIGHT apex. Emphysematous changes with question LEFT nipple shadow; repeat PA chest radiograph with nipple markers recommended to exclude pulmonary nodule. Electronically Signed   By: Lavonia Dana M.D.   On: 11/26/2018 15:14   US Renal  Result Date: 11/27/2018 CLINICAL DATA:  Lung cancer with difficulty  urinating EXAM: RENAL / URINARY TRACT ULTRASOUND COMPLETE COMPARISON:  PET-CT 08/30/2018 FINDINGS: Right Kidney: Renal measurements: 12.7 x 5.7 x 6.4 cm = volume: 242 mL. Increased cortical echogenicity. No hydronephrosis or mass. Left Kidney: Renal measurements: 12.3 x 6.3 x 5.9 cm = volume: 240 mL. Increased cortical echogenicity. No hydronephrosis. Bladder: Physiologic distension of the bladder. Trace pelvic fluid. No bladder wall thickening. IMPRESSION: 1. Moderate distension of the bladder without focal abnormality. 2. Medical renal disease. Electronically Signed   By: Monte Fantasia M.D.   On: 11/27/2018 10:02   Dg Chest 1v Repeat Same Day  Result Date: 11/26/2018 CLINICAL DATA:  Possible lung nodule versus nipple shadow. EXAM: CHEST - 1 VIEW SAME DAY COMPARISON:  Earlier same day FINDINGS: 2004 hours. Repeat frontal radiograph with nipple marker confirms that the nodular density overlying the posterior left eighth rib on the prior study represents a nipple shadow. Right apical lesion again noted with destruction of the second and third ribs. Left Port-A-Cath stable. The cardiopericardial silhouette is within normal limits for size. IMPRESSION: Nodular density seen at the left base on chest x-ray earlier today represented the patient's left nipple. No lung nodule in the left lower lung. Electronically Signed   By: Misty Stanley M.D.   On: 11/26/2018 20:31  Scheduled Meds: . aspirin  81 mg Oral q morning - 10a  . atorvastatin  80 mg Oral QHS  . chlorhexidine  15 mL Mouth Rinse BID  . cilostazol  50 mg Oral BID  . feeding supplement  1 Container Oral TID BM  . [START ON 11/29/2018] fentaNYL  1 patch Transdermal Q72H  . fluticasone  1 spray Each Nare QPM  . heparin injection (subcutaneous)  5,000 Units Subcutaneous Q8H  . insulin aspart  0-9 Units Subcutaneous TID WC  . magic mouthwash  5 mL Oral TID AC & HS  . mouth rinse  15 mL Mouth Rinse q12n4p  . metoprolol succinate  50 mg Oral Daily    . nicotine  14 mg Transdermal Daily  . pantoprazole  40 mg Oral BID   Continuous Infusions: . dextrose 5 % 850 mL with sodium bicarbonate 150 mEq infusion 150 mL/hr at 11/27/18 1100     LOS: 1 day    Time spent: 30 minutes   Barton Dubois, MD [facility] Pager [phone]   11/27/2018, 3:23 PM

## 2018-11-27 NOTE — Progress Notes (Signed)
RN paged MD to make aware that patient has not urinated since coming to hospital per ED and patient and not since admission.  Patient bladder scanned and 139 ml urine noted, awaiting response. P.J. Linus Mako, RN

## 2018-11-27 NOTE — Progress Notes (Signed)
CRITICAL VALUE ALERT  Critical Value:  Calcium 5.2  Date & Time Notied:  11/27/2018  Provider Notified: Dr. Velia Meyer  Orders Received/Actions taken: awaiting response

## 2018-11-27 NOTE — Progress Notes (Signed)
New order received to bladder scan patient q 6 hours and straight cath PRN for volume greater than 400 ml.  P.J. Linus Mako, RN

## 2018-11-28 LAB — RENAL FUNCTION PANEL
Albumin: 2.4 g/dL — ABNORMAL LOW (ref 3.5–5.0)
BUN: 114 mg/dL — ABNORMAL HIGH (ref 8–23)
CHLORIDE: 73 mmol/L — AB (ref 98–111)
CO2: 29 mmol/L (ref 22–32)
Calcium: 6 mg/dL — CL (ref 8.9–10.3)
Creatinine, Ser: 8.83 mg/dL — ABNORMAL HIGH (ref 0.61–1.24)
GFR calc Af Amer: 7 mL/min — ABNORMAL LOW (ref >60)
GFR calc non Af Amer: 6 mL/min — ABNORMAL LOW (ref >60)
GLUCOSE: 141 mg/dL — AB (ref 70–99)
Phosphorus: 4.5 mg/dL (ref 2.5–4.6)
Potassium: 3.6 mmol/L (ref 3.5–5.1)
Sodium: 129 mmol/L — ABNORMAL LOW (ref 135–145)

## 2018-11-28 LAB — BASIC METABOLIC PANEL
BUN: 114 mg/dL — AB (ref 8–23)
CO2: 29 mmol/L (ref 22–32)
Calcium: 6 mg/dL — CL (ref 8.9–10.3)
Chloride: 73 mmol/L — ABNORMAL LOW (ref 98–111)
Creatinine, Ser: 8.76 mg/dL — ABNORMAL HIGH (ref 0.61–1.24)
GFR calc Af Amer: 7 mL/min — ABNORMAL LOW (ref >60)
GFR calc non Af Amer: 6 mL/min — ABNORMAL LOW (ref >60)
GLUCOSE: 140 mg/dL — AB (ref 70–99)
Potassium: 3.7 mmol/L (ref 3.5–5.1)
Sodium: 128 mmol/L — ABNORMAL LOW (ref 135–145)

## 2018-11-28 LAB — HIV ANTIBODY (ROUTINE TESTING W REFLEX): HIV SCREEN 4TH GENERATION: NONREACTIVE

## 2018-11-28 LAB — GLUCOSE, CAPILLARY
GLUCOSE-CAPILLARY: 123 mg/dL — AB (ref 70–99)
Glucose-Capillary: 140 mg/dL — ABNORMAL HIGH (ref 70–99)
Glucose-Capillary: 85 mg/dL (ref 70–99)

## 2018-11-28 MED ORDER — SODIUM CHLORIDE 0.9 % IV SOLN
INTRAVENOUS | Status: DC
  Administered 2018-11-28 – 2018-12-03 (×7): via INTRAVENOUS

## 2018-11-28 NOTE — Consult Note (Addendum)
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  Also with recent calcium of 10.5 for which he was treated with hydration and zoledronic acid.  Prior to admission was taking metformin.  From review of labs, it appears that his creatinine has been 0.4-0.7 at baseline.  On 1/17 it was slightly elevated at 1.3 when he got 1 L normal saline and zoledronic acid.  He took some ibuprofen for headaches for a few days leading up to symptoms of respiratory infection for which he was started on doxycycline. He was admitted to the hospital 2 days ago with nausea/vomiting and inability to retain oral intake as well as complaints of abdominal pain and minimal urine output for about 2 days.  In the emergency room noted to have acute kidney injury with a BUN of 100 and creatinine of 9.3, anion gap metabolic acidosis and hyponatremia.  He continues to have an elevated creatinine which today is 8.7.  He reports poor appetite but denies any dysgeusia or abnormal limb jerking movements.  He has no urine output charted but there is at least 200 cc in his bag that was emptied earlier today.   10/26/2018  11/09/2018  11/16/2018  11/26/2018  11/27/2018  11/27/2018  11/28/2018   BUN 9 7 (L) 20 100 (H) 66 (H) 116 (H) 114 (H)  Creatinine 0.69 0.40 (L) 1.28 (H) 9.31 (H) 8.97 (H) 9.20 (H) 8.76 (H)    Past Medical History:  Diagnosis Date  . Bursitis   . CAD (coronary artery disease)     s/p stent placement unknown artery 12/2014,  . COPD (chronic obstructive  pulmonary disease) ([place])   . Diabetes mellitus ([place])   . GERD (gastroesophageal reflux disease)   . History of prostate cancer   . Hyperlipemia   . Hypertension   . Lung cancer ([place]) 12/2017   still having chemo - last was 2 weeks ago  . Peripheral arterial disease ([place])    a. h/o stents. b. 03/2016  - successful PTA and covered stenting using overlapping Viabahn covered stents of a long segment in-stent restenosis of previously placed nitinol self expanding stents in the proximal mid and distal right SFA.  . Tobacco abuse     Past Surgical History:  Procedure Laterality Date  . COLONOSCOPY WITH PROPOFOL N/A 11/22/2016   Procedure: COLONOSCOPY WITH PROPOFOL;  Surgeon: [name], MD;  Location: [facility] SU[facility];  Service: Endoscopy;  Laterality: N/A;  10:00 am  . CORONARY STENT PLACEMENT  12-2014   [facility] [employer]  . FEMORAL ARTERY STENT Bilateral 03/2013  . PERIPHERAL VASCULAR CATHETERIZATION N/A 04/04/2016   Procedure: Lower Extremity Angiography;  Surgeon: Lorretta Harp, MD;  Location: [place] CV LAB;  Service: Cardiovascular;  Laterality: N/A;  . PERIPHERAL VASCULAR CATHETERIZATION N/A 04/04/2016   Procedure: Abdominal Aortogram;  Surgeon: Lorretta Harp, MD;  Location: [place] CV LAB;  Service: Cardiovascular;  Laterality: N/A;  . PERIPHERAL VASCULAR CATHETERIZATION  04/04/2016   Procedure: Peripheral Vascular Intervention;  Surgeon: Lorretta Harp, MD;  Location: [place] CV LAB;  Service: Cardiovascular;;  rt SFA stent  . POLYPECTOMY  11/22/2016   Procedure: POLYPECTOMY;  Surgeon: [name], MD;  Location: [facility];  Service: Endoscopy;;  descending colon polyp, transverse colon polyp, sigmoid colon polyp, rectal polyp  . PORTACATH PLACEMENT Left 02/19/2018   Procedure: INSERTION PORT-A-CATH;  Surgeon: Aviva Signs, MD;  Location: [facility] ORS;  Service: General;  Laterality: Left;  . STENT PLACEMENT VASCULAR ([place] [facility])  2013    Family History   Problem Relation Age of Onset  . Diabetes Mother   . Heart disease Mother   . Hypertension Mother   . Healthy Brother   . Other Brother        accident   . Arthritis Sister   . Arthritis Sister   . SIDS Brother   . Colon cancer Neg Hx     Social History:  reports that he has been smoking cigarettes. He started smoking about 47 years ago. He has a 11.25 pack-year smoking history. He has never used smokeless tobacco. He reports previous alcohol use. He reports that he does not use drugs.  Allergies:  Allergies  Allergen Reactions  . Lisinopril Anaphylaxis  . Penicillins Anaphylaxis    Has patient had a PCN reaction causing immediate rash, facial/tongue/throat swelling, SOB or lightheadedness with hypotension:Yes Has patient had a PCN reaction causing severe rash involving mucus membranes or skin necrosis:unsure Has patient had a PCN reaction that required hospitalization:unsure Has patient had a PCN reaction occurring within the last 10 years:~10 years per patient If all of the above answers are "NO", then may proceed with Cephalosporin use.   . Ace Inhibitors Swelling    Medications:  Scheduled: . acetaZOLAMIDE  500 mg Oral Q12H  . aspirin  81 mg Oral q morning - 10a  . atorvastatin  80 mg Oral QHS  . calcium carbonate  1,000 mg of elemental calcium Oral TID WC  . chlorhexidine  15 mL Mouth Rinse BID  . cilostazol  50 mg Oral BID  . feeding supplement  1 Container Oral TID BM  . fentaNYL  1 patch Transdermal Q72H  . fluticasone  1 spray Each Nare QPM  . heparin injection (subcutaneous)  5,000 Units Subcutaneous Q8H  . insulin aspart  0-9 Units Subcutaneous TID WC  . magic mouthwash  5 mL Oral TID AC & HS  . mouth rinse  15 mL Mouth Rinse q12n4p  . metoprolol succinate  50 mg Oral Daily  . nicotine  14 mg Transdermal Daily  . pantoprazole  40 mg Oral BID  . potassium chloride  40 mEq Oral Once   BMP Latest Ref Rng & Units 11/29/2018 11/28/2018 11/28/2018  Glucose 70 -  99 mg/dL 106(H) 141(H) 140(H)  BUN 8 - 23 mg/dL 116(H) 114(H) 114(H)  Creatinine 0.61 - 1.24 mg/dL 8.72(H) 8.83(H) 8.76(H)  Sodium 135 - 145 mmol/L 130(L) 129(L) 128(L)  Potassium 3.5 - 5.1 mmol/L 3.2(L) 3.6 3.7  Chloride 98 - 111 mmol/L 78(L) 73(L) 73(L)  CO2 22 - 32 mmol/L 32 29 29  Calcium 8.9 - 10.3 mg/dL 5.4(LL) 6.0(LL) 6.0(LL)   CBC Latest Ref Rng & Units 11/29/2018 11/27/2018 11/26/2018  WBC 4.0 - 10.5 K/uL 10.7(H) 12.9(H) 14.8(H)  Hemoglobin 13.0 - 17.0 g/dL 8.0(L) 8.0(L) 9.1(L)  Hematocrit 39.0 - 52.0 % 23.8(L) 24.3(L) 29.7(L)  Platelets 150 - 400 K/uL 383 471(H) 518(H)     US Renal  Result Date: 11/27/2018 CLINICAL DATA:  Lung cancer with difficulty urinating EXAM: RENAL / URINARY TRACT ULTRASOUND COMPLETE COMPARISON:  PET-CT 08/30/2018 FINDINGS: Right  Kidney: Renal measurements: 12.7 x 5.7 x 6.4 cm = volume: 242 mL. Increased cortical echogenicity. No hydronephrosis or mass. Left Kidney: Renal measurements: 12.3 x 6.3 x 5.9 cm = volume: 240 mL. Increased cortical echogenicity. No hydronephrosis. Bladder: Physiologic distension of the bladder. Trace pelvic fluid. No bladder wall thickening. IMPRESSION: 1. Moderate distension of the bladder without focal abnormality. 2. Medical renal disease. Electronically Signed   By: Monte Fantasia M.D.   On: 11/27/2018 10:02    Review of Systems  Constitutional: Positive for malaise/fatigue and weight loss. Negative for chills and fever.  HENT: Negative.   Eyes: Negative.   Respiratory: Negative.   Cardiovascular: Negative.   Gastrointestinal: Negative for abdominal pain, diarrhea, nausea and vomiting.  Genitourinary: Negative.   Musculoskeletal: Negative.   Skin: Negative.    Blood pressure 112/73, pulse 94, temperature 97.9 F (36.6 C), temperature source Oral, resp. rate 16, height 5\' 9"  (1.753 m), weight 49.3 kg, SpO2 92 %. Physical Exam  Nursing note and vitals reviewed. Constitutional: He is oriented to person, place, and time.  He appears well-developed and well-nourished. No distress.  HENT:  Head: Normocephalic and atraumatic.  Dry lips/oral mucosa  Eyes: Pupils are equal, round, and reactive to light. Conjunctivae and EOM are normal. No scleral icterus.  Neck: Normal range of motion. Neck supple. No JVD present.  Cardiovascular: Normal rate, regular rhythm and normal heart sounds.  Respiratory: Effort normal and breath sounds normal. He has no wheezes. He has no rales.  Hyperpigmented skin (previous radiation) right upper chest  GI: Soft. Bowel sounds are normal. There is no abdominal tenderness. There is no rebound and no guarding.  Musculoskeletal: Normal range of motion.        General: No edema.  Neurological: He is alert and oriented to person, place, and time.  Skin: Skin is warm and dry. No erythema.    Assessment/Plan: 1.  Acute kidney injury: I suspect this is ATN that may be multifactorial but suspected to be largely associated with recent zoledronic acid administration.  He likely was volume depleted on account of his hypercalcemia and had intercurrent use of NSAIDs prior to presentation to the hospital.  Per his description, he appears to be nonoliguric at this time and does not have any uremic symptoms or indications for hemodialysis based on labs.  I suspect that he is possibly in the plateau phase of ATN and we should see some ensuing renal recovery in the near future.  Baseline renal function was normal and this bodes well towards his prognosis.  I informed him that if it came to the need for renal replacement therapy, I would recommend a timed trial of no more than 2 weeks with clear endpoints outlined due to his underlying lung cancer currently on palliative chemotherapy.  The timeline does not support renal toxicity from durvalumab.  Will reorder for strict input/output monitoring and will send off for urine electrolytes. 2.  Metabolic alkalosis: Likely iatrogenic from correction of anion gap  metabolic acidosis noted on admission (lactate/acute kidney injury) and now endogenous breakdown of lactic acid to bicarbonate.  Because this is contributing to his hypokalemia and hypocalcemia, I will treat him with oral acetazolamide. 3.  Non-small cell lung cancer: Recently on palliative chemotherapy with carboplatin and gemcitabine. 4.  Failure to thrive/cachexia 5.  Anemia: Associated with recent malignancy/ongoing chemotherapy 6.  Hypocalcemia: Corrected calcium today is ~7.0.  This appears to be possibly have induced by preceding treatment with Zometa and currently sustained by  increased calcium binding to albumin in alkaline milieu.  Agree with intravenous and oral calcium supplementation. 7.  Hypomagnesemia: Possibly due to tubular defect associated with platinum based chemotherapy.  Indeed contributing to hypocalcemia and for which he has magnesium sulfate ordered.  We will start him on magnesium oxide as well. 8.  Hypokalemia: Secondary to potassium shifting with alkalosis, agree with judicious oral supplementation and monitoring with improving urine output.  Kaycie Pegues K. 11/29/2018, 9:38 AM

## 2018-11-28 NOTE — Progress Notes (Signed)
RN spoke to [place] in pharmacy to discuss Calcium Gluconate order.  Tiffany instructed RN to clarify with provider if 2 doses of Calcium Gluconate need to be given to patient this evening.  P.J. Linus Mako, RN

## 2018-11-28 NOTE — Progress Notes (Signed)
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 Patient came to the ED due to inability to urinate. His oral intake has been poor and progressively decreasing. Family at bedside reported he was vomiting at home and spitting while in the hospital. Denies nausea, fevers, chest pain, SOB, abdominal discomfort, melena, hematochezia.   Assessment & Plan:   Principal Problem:   Acute renal failure (ARF) (HCC) Active Problems:   Essential hypertension   Diabetes mellitus with circulatory complication (HCC)   Tobacco abuse   Squamous cell carcinoma of right lung (HCC)   Anemia associated with chemotherapy  Acute renal failure -Creatinine yesterday was  8.97 and today is 8.83 -Likely due to prerenal failure secondary to dehydration andpoor oral intake. -renal US also demonstrated distended bladder, but no hydronephrosis -Continue following up renal function panel -continue IVF's -Foley placed with poor urine output noted. -nephrology service will follow up  -Change IV fluid to normal saline today  Lung CA/with chronic pain -NSCLC diagnosed in 2/19 -Continue fentanyl patch (50 mcg) as well as Dilaudid 2 mg PO q2h. -Continue [facility]'s mouthwash for thrush -will hold chemotherapy while inpatient -tobacco cessation encouraged -after discussing with family and in the setting of further decline in his health and quality will request palliative care consult which is currently pending  Cachexia -Suggest consideration of Remeron once clinically stable and proven to tolerate PO's  HTN -Continue Toprol XL. -BP hypotensive for now.  With holding home medications as patient is refusing.  DM -Continue following glucose trend -Continue holding Glucophage  while inpatient -will use SSI  Anemia -stable -will follow trend   Tobacco dependence -I have discussed tobacco cessation with the patient. I have counseled the patient regarding the negative impacts of continued tobacco use including but not limited to lung cancer, COPD, and cardiovascular disease. I have discussed alternatives to tobacco and modalities that may help facilitate tobacco cessation including but not limited to biofeedback, hypnosis, and medications. Total time spent with tobacco counseling was 4 minutes. -Continue the use of nicotine patch  Hypocalcemia-persistent -will give three doses of calcium gluconate -follow trend    DVT prophylaxis: heparin  Code Status: FULL Family Communication:  None currently at bedside Disposition Plan: home once clinically improved  Consultants:   Nephrology   Palliative Care  Procedures:   None   Antimicrobials:     Anti-infectives (From admission, onward)   None      Subjective: Patient seen and evaluated today with no new acute complaints or concerns. No acute concerns or events noted overnight.  Patient is refusing his medications this morning and is refusing to talk as well.  He was noted to have some anxiety last night.  Objective: Vitals:   11/27/18 0713 11/27/18 2205 11/28/18 0618 11/28/18 0756  BP: 108/69 104/74 102/71 99/71  Pulse: 91 100 (!) 109 (!) 104  Resp: 16 18 18 17   Temp: 97.6 F (36.4 C) 97.6 F (36.4 C) 98 F (36.7 C) 98.3 F (36.8 C)  TempSrc: Oral Oral Oral Oral  SpO2: 100% 99% 97% 97%  Weight:      Height:        Intake/Output Summary (Last 24 hours) at 11/28/2018 0947 Last data filed at 11/28/2018 5631 Gross  per 24 hour  Intake 3191.48 ml  Output 675 ml  Net 2516.48 ml   Filed Weights   11/26/18 1337 11/26/18 2057  Weight: 47 kg 49.3 kg    Examination:  General exam: Appears calm and comfortable  Respiratory system: Clear to auscultation. Respiratory effort  normal. Cardiovascular system: S1 & S2 heard, RRR. No JVD, murmurs, rubs, gallops or clicks. No pedal edema. Gastrointestinal system: Abdomen is nondistended, soft and nontender. No organomegaly or masses felt. Normal bowel sounds heard. Central nervous system: Alert and oriented. No focal neurological deficits. Extremities: Symmetric 5 x 5 power. Skin: No rashes, lesions or ulcers Psychiatry: Judgement and insight appear normal. Mood & affect appropriate.     Data Reviewed: I have personally reviewed following labs and imaging studies  CBC: Recent Labs  Lab 11/26/18 1550 11/27/18 0415  WBC 14.8* 12.9*  NEUTROABS 12.9*  --   HGB 9.1* 8.0*  HCT 29.7* 24.3*  MCV 85.6 80.5  PLT 518* 025*   Basic Metabolic Panel: Recent Labs  Lab 11/26/18 1550 11/27/18 0415 11/27/18 1551 11/28/18 0704  NA 127* 129* 125* 129*  128*  K 5.3* 4.6 3.6 3.6  3.7  CL 85* 85* 77* 73*  73*  CO2 7* 18* 22 29  29   GLUCOSE 102* 149* 168* 141*  140*  BUN 100* 66* 116* 114*  114*  CREATININE 9.31* 8.97* 9.20* 8.83*  8.76*  CALCIUM 6.9* 5.9* 5.7* 6.0*  6.0*  PHOS  --   --   --  4.5   GFR: Estimated Creatinine Clearance: 6 mL/min (A) (by C-G formula based on SCr of 8.83 mg/dL (H)). Liver Function Tests: Recent Labs  Lab 11/26/18 1550 11/28/18 0704  AST 36  --   ALT 37  --   ALKPHOS 82  --   BILITOT 0.6  --   PROT 7.3  --   ALBUMIN 2.8* 2.4*   Recent Labs  Lab 11/26/18 1550  LIPASE 21   No results for input(s): AMMONIA in the last 168 hours. Coagulation Profile: No results for input(s): INR, PROTIME in the last 168 hours. Cardiac Enzymes: Recent Labs  Lab 11/26/18 1550  TROPONINI <0.03   BNP (last 3 results) No results for input(s): PROBNP in the last 8760 hours. HbA1C: Recent Labs    11/26/18 1550  HGBA1C 7.2*   CBG: Recent Labs  Lab 11/27/18 0802 11/27/18 1130 11/27/18 1642 11/27/18 2207 11/28/18 0746  GLUCAP 138* 125* 206* 115* 140*   Lipid Profile: No  results for input(s): CHOL, HDL, LDLCALC, TRIG, CHOLHDL, LDLDIRECT in the last 72 hours. Thyroid Function Tests: No results for input(s): TSH, T4TOTAL, FREET4, T3FREE, THYROIDAB in the last 72 hours. Anemia Panel: No results for input(s): VITAMINB12, FOLATE, FERRITIN, TIBC, IRON, RETICCTPCT in the last 72 hours. Sepsis Labs: No results for input(s): PROCALCITON, LATICACIDVEN in the last 168 hours.  No results found for this or any previous visit (from the past 240 hour(s)).       Radiology Studies: Dg Chest 2 View  Result Date: 11/26/2018 CLINICAL DATA:  Shortness of breath, weakness, history prostate cancer, lung cancer EXAM: CHEST TWO VIEWS COMPARISON:  Chest radiograph 02/19/2018, PET-CT 08/30/2018 FINDINGS: LEFT subclavian Port-A-Cath with tip projecting over SVC. Normal heart size, mediastinal contours, and pulmonary vascularity. Chronic pleuroparenchymal opacity at the RIGHT apex associated with a cavitary focus versus loculated pneumothorax unchanged from PET-CT. Bone destruction of the lateral RIGHT second and posterior RIGHT third ribs again identified. Underlying emphysematous changes. Question LEFT nipple  shadow. No definite acute infiltrate, pleural effusion or pneumothorax. IMPRESSION: Known tumor at RIGHT apex with bone destruction of the RIGHT second and third ribs as well as a chronic cavitary focus versus loculated pneumothorax at RIGHT apex. Emphysematous changes with question LEFT nipple shadow; repeat PA chest radiograph with nipple markers recommended to exclude pulmonary nodule. Electronically Signed   By: Lavonia Dana M.D.   On: 11/26/2018 15:14   US Renal  Result Date: 11/27/2018 CLINICAL DATA:  Lung cancer with difficulty urinating EXAM: RENAL / URINARY TRACT ULTRASOUND COMPLETE COMPARISON:  PET-CT 08/30/2018 FINDINGS: Right Kidney: Renal measurements: 12.7 x 5.7 x 6.4 cm = volume: 242 mL. Increased cortical echogenicity. No hydronephrosis or mass. Left Kidney: Renal  measurements: 12.3 x 6.3 x 5.9 cm = volume: 240 mL. Increased cortical echogenicity. No hydronephrosis. Bladder: Physiologic distension of the bladder. Trace pelvic fluid. No bladder wall thickening. IMPRESSION: 1. Moderate distension of the bladder without focal abnormality. 2. Medical renal disease. Electronically Signed   By: Monte Fantasia M.D.   On: 11/27/2018 10:02   Dg Chest 1v Repeat Same Day  Result Date: 11/26/2018 CLINICAL DATA:  Possible lung nodule versus nipple shadow. EXAM: CHEST - 1 VIEW SAME DAY COMPARISON:  Earlier same day FINDINGS: 2004 hours. Repeat frontal radiograph with nipple marker confirms that the nodular density overlying the posterior left eighth rib on the prior study represents a nipple shadow. Right apical lesion again noted with destruction of the second and third ribs. Left Port-A-Cath stable. The cardiopericardial silhouette is within normal limits for size. IMPRESSION: Nodular density seen at the left base on chest x-ray earlier today represented the patient's left nipple. No lung nodule in the left lower lung. Electronically Signed   By: Misty Stanley M.D.   On: 11/26/2018 20:31        Scheduled Meds: . aspirin  81 mg Oral q morning - 10a  . atorvastatin  80 mg Oral QHS  . chlorhexidine  15 mL Mouth Rinse BID  . cilostazol  50 mg Oral BID  . feeding supplement  1 Container Oral TID BM  . [START ON 11/29/2018] fentaNYL  1 patch Transdermal Q72H  . fluticasone  1 spray Each Nare QPM  . heparin injection (subcutaneous)  5,000 Units Subcutaneous Q8H  . insulin aspart  0-9 Units Subcutaneous TID WC  . magic mouthwash  5 mL Oral TID AC & HS  . mouth rinse  15 mL Mouth Rinse q12n4p  . metoprolol succinate  50 mg Oral Daily  . nicotine  14 mg Transdermal Daily  . pantoprazole  40 mg Oral BID   Continuous Infusions: . calcium gluconate 1,000 mg (11/28/18 0935)  . dextrose 5 % 850 mL with sodium bicarbonate 150 mEq infusion 150 mL/hr at 11/28/18 0935      LOS: 2 days    Time spent: 30 minutes    Pratik Darleen Crocker, DO [facility] Pager [phone]  If 7PM-7AM, please contact night-coverage [email or web address] Password [facility] 11/28/2018, 9:47 AM

## 2018-11-28 NOTE — Progress Notes (Signed)
Refused oral meds.  Will not answer or talk.   Dr. Manuella Ghazi aware

## 2018-11-29 ENCOUNTER — Encounter (HOSPITAL_COMMUNITY): Payer: Self-pay | Admitting: Primary Care

## 2018-11-29 DIAGNOSIS — Z7189 Other specified counseling: Secondary | ICD-10-CM

## 2018-11-29 DIAGNOSIS — Z515 Encounter for palliative care: Secondary | ICD-10-CM

## 2018-11-29 LAB — BASIC METABOLIC PANEL
Anion gap: 20 — ABNORMAL HIGH (ref 5–15)
BUN: 116 mg/dL — AB (ref 8–23)
CO2: 32 mmol/L (ref 22–32)
CREATININE: 8.72 mg/dL — AB (ref 0.61–1.24)
Calcium: 5.4 mg/dL — CL (ref 8.9–10.3)
Chloride: 78 mmol/L — ABNORMAL LOW (ref 98–111)
GFR calc Af Amer: 7 mL/min — ABNORMAL LOW (ref >60)
GFR calc non Af Amer: 6 mL/min — ABNORMAL LOW (ref >60)
Glucose, Bld: 106 mg/dL — ABNORMAL HIGH (ref 70–99)
Potassium: 3.2 mmol/L — ABNORMAL LOW (ref 3.5–5.1)
Sodium: 130 mmol/L — ABNORMAL LOW (ref 135–145)

## 2018-11-29 LAB — PHOSPHORUS: Phosphorus: 5 mg/dL — ABNORMAL HIGH (ref 2.5–4.6)

## 2018-11-29 LAB — CBC
HCT: 23.8 % — ABNORMAL LOW (ref 39.0–52.0)
Hemoglobin: 8 g/dL — ABNORMAL LOW (ref 13.0–17.0)
MCH: 26.4 pg (ref 26.0–34.0)
MCHC: 33.6 g/dL (ref 30.0–36.0)
MCV: 78.5 fL — ABNORMAL LOW (ref 80.0–100.0)
Platelets: 383 10*3/uL (ref 150–400)
RBC: 3.03 MIL/uL — ABNORMAL LOW (ref 4.22–5.81)
RDW: 16.4 % — AB (ref 11.5–15.5)
WBC: 10.7 10*3/uL — ABNORMAL HIGH (ref 4.0–10.5)
nRBC: 0 % (ref 0.0–0.2)

## 2018-11-29 LAB — URINE CULTURE
Culture: NO GROWTH
SPECIAL REQUESTS: NORMAL

## 2018-11-29 LAB — GLUCOSE, CAPILLARY
GLUCOSE-CAPILLARY: 96 mg/dL (ref 70–99)
Glucose-Capillary: 138 mg/dL — ABNORMAL HIGH (ref 70–99)
Glucose-Capillary: 123 mg/dL — ABNORMAL HIGH (ref 70–99)
Glucose-Capillary: 96 mg/dL (ref 70–99)

## 2018-11-29 LAB — MAGNESIUM: Magnesium: 1.3 mg/dL — ABNORMAL LOW (ref 1.7–2.4)

## 2018-11-29 MED ORDER — CALCIUM GLUCONATE-NACL 1-0.675 GM/50ML-% IV SOLN
1.0000 g | Freq: Once | INTRAVENOUS | Status: AC
  Administered 2018-11-29: 1000 mg via INTRAVENOUS
  Filled 2018-11-29: qty 50

## 2018-11-29 MED ORDER — CALCIUM CARBONATE 1250 (500 CA) MG PO TABS
1000.0000 mg | ORAL_TABLET | Freq: Three times a day (TID) | ORAL | Status: DC
  Administered 2018-11-29 – 2018-12-06 (×21): 1000 mg via ORAL
  Filled 2018-11-29 (×23): qty 1

## 2018-11-29 MED ORDER — CALCIUM GLUCONATE-NACL 1-0.675 GM/50ML-% IV SOLN
1.0000 g | Freq: Once | INTRAVENOUS | Status: AC
  Administered 2018-11-29: 1000 mg via INTRAVENOUS

## 2018-11-29 MED ORDER — MAGNESIUM SULFATE 2 GM/50ML IV SOLN
2.0000 g | Freq: Once | INTRAVENOUS | Status: AC
  Administered 2018-11-29: 2 g via INTRAVENOUS
  Filled 2018-11-29: qty 50

## 2018-11-29 MED ORDER — POTASSIUM CHLORIDE CRYS ER 20 MEQ PO TBCR
40.0000 meq | EXTENDED_RELEASE_TABLET | Freq: Once | ORAL | Status: AC
  Administered 2018-11-29: 40 meq via ORAL
  Filled 2018-11-29: qty 4

## 2018-11-29 MED ORDER — ACETAZOLAMIDE ER 500 MG PO CP12
500.0000 mg | ORAL_CAPSULE | Freq: Two times a day (BID) | ORAL | Status: AC
  Administered 2018-11-29 – 2018-11-30 (×3): 500 mg via ORAL
  Filled 2018-11-29 (×3): qty 1

## 2018-11-29 MED ORDER — CALCIUM GLUCONATE-NACL 2-0.675 GM/100ML-% IV SOLN: 2.0000 g | Freq: Once | INTRAVENOUS | Status: DC

## 2018-11-29 MED ORDER — ENSURE ENLIVE PO LIQD
237.0000 mL | Freq: Four times a day (QID) | ORAL | Status: DC
  Administered 2018-11-29 – 2018-12-06 (×14): 237 mL via ORAL

## 2018-11-29 MED ORDER — MAGNESIUM OXIDE 400 (241.3 MG) MG PO TABS
400.0000 mg | ORAL_TABLET | Freq: Two times a day (BID) | ORAL | Status: DC
  Administered 2018-11-29 – 2018-12-02 (×7): 400 mg via ORAL
  Filled 2018-11-29 (×9): qty 1

## 2018-11-29 NOTE — Progress Notes (Signed)
CRITICAL VALUE ALERT  Critical Value: Calcium 5.4  Date & Time Notied:  11/29/2018 8101  Provider Notified: Olevia Bowens  Orders Received/Actions taken:

## 2018-11-29 NOTE — Care Management Important Message (Signed)
Important Message  Patient Details  Name: Fred Hernandez MRN: [id number] Date of Birth: 05-Nov-1955   Medicare Important Message Given:  Yes    Tommy Medal 11/29/2018, 10:45 AM

## 2018-11-29 NOTE — Progress Notes (Signed)
PROGRESS NOTE    Fred Hernandez  ZOX:[id number] DOB: [birth year] DOA: 11/26/2018 PCP: Vidal Schwalbe, MD   Brief Narrative:  63 year old male with PMH of hypertension, PVD s/p stents, CAD s/p stent, DN type 2, HLD, COPD with stage IIb/IIIa SCC of RUL with local extension and prostate CA. Patient came to the ED due to inability to urinate. His oral intake has been poor and progressively decreasing. Family at bedside reported he was vomiting at home and spitting while in the hospital. Denies nausea, fevers, chest pain, SOB, abdominal discomfort, melena, hematochezia.  Patient was admitted with AKI secondary likely to ATN with Zometa use and persistent hypocalcemia as well as other electrolyte derangements.  Appreciate nephrology assistance.  Assessment & Plan:   Principal Problem:   Acute renal failure (ARF) (HCC) Active Problems:   Essential hypertension   Diabetes mellitus with circulatory complication (HCC)   Tobacco abuse   Squamous cell carcinoma of right lung (HCC)   Anemia associated with chemotherapy  Acute renal failure-persistent and likely secondary to ATN -Creatinineyesterday was8.83 and today is 8.72 -Likely due to ATN with Zometa use -renal US also demonstrated distended bladder, but no hydronephrosis -Continue followingup renal function panel -continue IVF with normal saline -Foley to be changed over to condom catheter today -Appreciate nephrology assistance and it appears to likely be related to Zometa use per discussion with Dr. Posey Pronto yesterday; may require limited course of hemodialysis for improvement.  Lung CA/with chronic pain -NSCLC diagnosed in 2/19 -Continuefentanyl patch (50 mcg) as well as Dilaudid 2 mg PO q2h. -Continue [facility]'s mouthwash forthrush -will hold chemotherapy while inpatient -tobacco cessation encouraged -Appreciate palliative care discussions for goals of care  Cachexia -Suggest consideration of Remeron once clinically stableand  proven to tolerate PO's  HTN -Continue Toprol XL. -BP hypotensive for now.  With holding home medications until blood pressure starts to increase  DM -Continue following glucose trend -ContinueholdingGlucophage while inpatient -will use SSI  Anemia -stable -will follow trend  Tobacco dependence -I have discussed tobacco cessation with the patient. I have counseled the patient regarding the negative impacts of continued tobacco use including but not limited to lung cancer, COPD, and cardiovascular disease. I have discussed alternatives to tobacco and modalities that may help facilitate tobacco cessation including but not limited to biofeedback, hypnosis, and medications. Total time spent with tobacco counseling was4 minutes. -Continue the use of nicotine patch  Hypocalcemia-persistent -Continue replacement with calcium gluconate -Continue to monitor and appreciate nephrology assistance   DVT prophylaxis:heparin Code Status:FULL Family Communication:  Fianc at bedside Disposition Plan:home once clinically improved  Consultants:  Nephrology   Palliative Care  Procedures:  None  Antimicrobials:    Anti-infectives(From admission, onward)   None      Subjective: Patient seen and evaluated today with no new acute complaints or concerns. No acute concerns or events noted overnight.  He states that his appetite appears to be improving some and that he would like "real food."  He also states that he needs to go home soon to pay his bills.  Objective: Vitals:   11/28/18 0618 11/28/18 0756 11/28/18 2211 11/29/18 0557  BP: 102/71 99/71 (!) 101/55 112/73  Pulse: (!) 109 (!) 104 (!) 103 94  Resp: 18 17 20 16   Temp: 98 F (36.7 C) 98.3 F (36.8 C) 98.2 F (36.8 C) 97.9 F (36.6 C)  TempSrc: Oral Oral Oral Oral  SpO2: 97% 97% 98% 92%  Weight:      Height:  Intake/Output Summary (Last 24 hours) at 11/29/2018 1142 Last data filed  at 11/29/2018 0900 Gross per 24 hour  Intake 1707.54 ml  Output -  Net 1707.54 ml   Filed Weights   11/26/18 1337 11/26/18 2057  Weight: 47 kg 49.3 kg    Examination:  General exam: Appears calm and comfortable  Respiratory system: Clear to auscultation. Respiratory effort normal. Cardiovascular system: S1 & S2 heard, RRR. No JVD, murmurs, rubs, gallops or clicks. No pedal edema. Gastrointestinal system: Abdomen is nondistended, soft and nontender. No organomegaly or masses felt. Normal bowel sounds heard. Central nervous system: Alert and oriented. No focal neurological deficits. Extremities: Symmetric 5 x 5 power. Skin: No rashes, lesions or ulcers Psychiatry: Judgement and insight appear normal. Mood & affect appropriate.     Data Reviewed: I have personally reviewed following labs and imaging studies  CBC: Recent Labs  Lab 11/26/18 1550 11/27/18 0415 11/29/18 0515  WBC 14.8* 12.9* 10.7*  NEUTROABS 12.9*  --   --   HGB 9.1* 8.0* 8.0*  HCT 29.7* 24.3* 23.8*  MCV 85.6 80.5 78.5*  PLT 518* 471* 664   Basic Metabolic Panel: Recent Labs  Lab 11/26/18 1550 11/27/18 0415 11/27/18 1551 11/28/18 0704 11/29/18 0515  NA 127* 129* 125* 129*  128* 130*  K 5.3* 4.6 3.6 3.6  3.7 3.2*  CL 85* 85* 77* 73*  73* 78*  CO2 7* 18* 22 29  29  32  GLUCOSE 102* 149* 168* 141*  140* 106*  BUN 100* 66* 116* 114*  114* 116*  CREATININE 9.31* 8.97* 9.20* 8.83*  8.76* 8.72*  CALCIUM 6.9* 5.9* 5.7* 6.0*  6.0* 5.4*  MG  --   --   --   --  1.3*  PHOS  --   --   --  4.5 5.0*   GFR: Estimated Creatinine Clearance: 6.1 mL/min (A) (by C-G formula based on SCr of 8.72 mg/dL (H)). Liver Function Tests: Recent Labs  Lab 11/26/18 1550 11/28/18 0704  AST 36  --   ALT 37  --   ALKPHOS 82  --   BILITOT 0.6  --   PROT 7.3  --   ALBUMIN 2.8* 2.4*   Recent Labs  Lab 11/26/18 1550  LIPASE 21   No results for input(s): AMMONIA in the last 168 hours. Coagulation Profile: No  results for input(s): INR, PROTIME in the last 168 hours. Cardiac Enzymes: Recent Labs  Lab 11/26/18 1550  TROPONINI <0.03   BNP (last 3 results) No results for input(s): PROBNP in the last 8760 hours. HbA1C: Recent Labs    11/26/18 1550  HGBA1C 7.2*   CBG: Recent Labs  Lab 11/28/18 0746 11/28/18 1714 11/28/18 2207 11/29/18 0726 11/29/18 1110  GLUCAP 140* 85 123* 96 138*   Lipid Profile: No results for input(s): CHOL, HDL, LDLCALC, TRIG, CHOLHDL, LDLDIRECT in the last 72 hours. Thyroid Function Tests: No results for input(s): TSH, T4TOTAL, FREET4, T3FREE, THYROIDAB in the last 72 hours. Anemia Panel: No results for input(s): VITAMINB12, FOLATE, FERRITIN, TIBC, IRON, RETICCTPCT in the last 72 hours. Sepsis Labs: No results for input(s): PROCALCITON, LATICACIDVEN in the last 168 hours.  Recent Results (from the past 240 hour(s))  Urine culture     Status: None   Collection Time: 11/26/18  2:09 PM  Result Value Ref Range Status   Specimen Description   Final    URINE, CATHETERIZED Performed at [facility], [address], [place], [place] [address]    Special Requests  Final    Normal Performed at [facility], [address] [place], [place] [address]    Culture   Final    NO GROWTH Performed at [facility], [place] [address] [place], [place] [address]    Report Status 11/29/2018 FINAL  Final         Radiology Studies: No results found.      Scheduled Meds: . acetaZOLAMIDE  500 mg Oral Q12H  . aspirin  81 mg Oral q morning - 10a  . atorvastatin  80 mg Oral QHS  . calcium carbonate  1,000 mg of elemental calcium Oral TID WC  . chlorhexidine  15 mL Mouth Rinse BID  . cilostazol  50 mg Oral BID  . feeding supplement  1 Container Oral TID BM  . fentaNYL  1 patch Transdermal Q72H  . fluticasone  1 spray Each Nare QPM  . heparin injection (subcutaneous)  5,000 Units Subcutaneous Q8H  . insulin aspart  0-9 Units Subcutaneous TID WC  . magic  mouthwash  5 mL Oral TID AC & HS  . magnesium oxide  400 mg Oral BID  . mouth rinse  15 mL Mouth Rinse q12n4p  . metoprolol succinate  50 mg Oral Daily  . nicotine  14 mg Transdermal Daily  . pantoprazole  40 mg Oral BID   Continuous Infusions: . sodium chloride 100 mL/hr at 11/28/18 1204  . calcium gluconate 1,000 mg (11/29/18 1125)   Followed by  . calcium gluconate       LOS: 3 days    Time spent: 30 minutes    Colinda Barth Darleen Crocker, DO [facility] Pager [phone]  If 7PM-7AM, please contact night-coverage [email or web address] Password [email or web address] 11/29/2018, 11:42 AM

## 2018-11-29 NOTE — Consult Note (Signed)
Consultation Note Date: 11/29/2018   Patient Name: Fred Hernandez  DOB: [birth year]  MRN: [id number]  Age / Sex: 63 y.o., male  PCP: Vidal Schwalbe, MD Referring Physician: Rodena Goldmann, DO  Reason for Consultation: Establishing goals of care  HPI/Patient Profile: 63 y.o. male  with past medical history of lung cancer receiving chemo, prostate cancer, COPD continues to smoke, coronary artery disease with a stent placed 12/2014, PAD stents placed 03/2016, high blood pressure and cholesterol, GERD, diabetes, weight loss admitted on 11/26/2018 with acute renal failure.  PMT consulted for goals of care and advance directives.  Clinical Assessment and Goals of Care: Fred Hernandez is resting quietly in bed.  Fred Hernandez greets me making and keeping eye contact.  Fred Hernandez is calm and cooperative, pleasant.  Present today at bedside his girlfriend/significant other of 5 years, Royden Purl.  They share that they live together.  We talked about his acute and chronic health problems.  Fred Hernandez tells me that 1 year ago his weight was 158 pounds.  Fred Hernandez shares that Fred Hernandez has lost even more weight over the last 3 months.    Talk in detail about his acute health problem related to his kidney damage, and the treatment plan.  [name] tells me that at this point Fred Hernandez is agreeable to stay in the hospital as long as needed.  We talked about the seriousness of his illness, and again discussed the treatment plan.  We talked about healthcare power of attorney, see below. We talked about CODE STATUS, see below.  HCPOA   OTHER -Mr. Bransfield tells me that his desire is for his significant other of 5 years, Royden Purl, to make healthcare choices if Fred Hernandez is unable.  Fred Hernandez tells me that she can abide Fred Hernandez's wishes.  Fred Hernandez tells me that Fred Hernandez is not married, his parents are deceased, Fred Hernandez has 1 son, [name] read.  Fred Hernandez also has 2 sisters Earney Hamburg, and Anderson Malta.     SUMMARY OF RECOMMENDATIONS   Continue to treat the treatable Continue cancer treatments as offered Yes to chest compressions and defibrillations, no to intubation  Code Status/Advance Care Planning:  Limited code -we talked about the realities of CPR and intubation.  Fred Hernandez tells me that Fred Hernandez would not want to be intubated, but at this point Fred Hernandez would choose to be shocked and have chest compressions.  I share that usually chest compressions do are not effective without intubation, I consider that if Fred Hernandez were to worsen, CPR and or intubation would not change his current health status, his cancer.  Code status changed.  Symptom Management:   Per hospitalist, no additional needs at this time.  Palliative Prophylaxis:   No special needs at this time.  Additional Recommendations (Limitations, Scope, Preferences):  Continue to treat the treatable, no CPR, yes to defibrillation and chest compressions.  Psycho-social/Spiritual:   Desire for further Chaplaincy support:no  Additional Recommendations: Caregiving  Support/Resources and Education on Hospice  Prognosis:   Unable to determine, based on outcomes.  Discharge Planning: To be determined, based on outcomes.  Anticipate return home if possible.      Primary Diagnoses: Present on Admission: . Acute renal failure (ARF) ([place]) . Diabetes mellitus with circulatory complication ([place]) . Essential hypertension . Squamous cell carcinoma of right lung ([place]) . Tobacco abuse . Anemia associated with chemotherapy   I have reviewed the medical record, interviewed the patient and family, and examined the patient. The following aspects are pertinent.  Past Medical History:  Diagnosis Date  . Bursitis   . CAD (coronary artery disease)     s/p stent placement unknown artery 12/2014,  . COPD (chronic obstructive pulmonary disease) ([place])   . Diabetes mellitus ([place])   . GERD (gastroesophageal reflux disease)   . History of prostate  cancer   . Hyperlipemia   . Hypertension   . Lung cancer ([place]) 12/2017   still having chemo - last was 2 weeks ago  . Peripheral arterial disease ([place])    a. h/o stents. b. 03/2016  - successful PTA and covered stenting using overlapping Viabahn covered stents of a long segment in-stent restenosis of previously placed nitinol self expanding stents in the proximal mid and distal right SFA.  . Tobacco abuse    Social History   Socioeconomic History  . Marital status: Single    Spouse name: Not on file  . Number of children: Not on file  . Years of education: Not on file  . Highest education level: Not on file  Occupational History  . Occupation: unemployed  Social Needs  . Financial resource strain: Not on file  . Food insecurity:    Worry: Not on file    Inability: Not on file  . Transportation needs:    Medical: Not on file    Non-medical: Not on file  Tobacco Use  . Smoking status: Current Every Day Smoker    Packs/day: 0.25    Years: 45.00    Pack years: 11.25    Types: Cigarettes    Start date: 02/12/1971  . Smokeless tobacco: Never Used  Substance and Sexual Activity  . Alcohol use: Not Currently    Alcohol/week: 0.0 standard drinks  . Drug use: No  . Sexual activity: Yes  Lifestyle  . Physical activity:    Days per week: Not on file    Minutes per session: Not on file  . Stress: Not on file  Relationships  . Social connections:    Talks on phone: Not on file    Gets together: Not on file    Attends religious service: Not on file    Active member of club or organization: Not on file    Attends meetings of clubs or organizations: Not on file    Relationship status: Not on file  Other Topics Concern  . Not on file  Social History Narrative  . Not on file   Family History  Problem Relation Age of Onset  . Diabetes Mother   . Heart disease Mother   . Hypertension Mother   . Healthy Brother   . Other Brother        accident   . Arthritis Sister   .  Arthritis Sister   . SIDS Brother   . Colon cancer Neg Hx    Scheduled Meds: . acetaZOLAMIDE  500 mg Oral Q12H  . aspirin  81 mg Oral q morning - 10a  . atorvastatin  80 mg Oral QHS  . calcium carbonate  1,000 mg of  elemental calcium Oral TID WC  . chlorhexidine  15 mL Mouth Rinse BID  . cilostazol  50 mg Oral BID  . feeding supplement  1 Container Oral TID BM  . fentaNYL  1 patch Transdermal Q72H  . fluticasone  1 spray Each Nare QPM  . heparin injection (subcutaneous)  5,000 Units Subcutaneous Q8H  . insulin aspart  0-9 Units Subcutaneous TID WC  . magic mouthwash  5 mL Oral TID AC & HS  . magnesium oxide  400 mg Oral BID  . mouth rinse  15 mL Mouth Rinse q12n4p  . metoprolol succinate  50 mg Oral Daily  . nicotine  14 mg Transdermal Daily  . pantoprazole  40 mg Oral BID   Continuous Infusions: . sodium chloride 100 mL/hr at 11/28/18 1204  . calcium gluconate     Followed by  . calcium gluconate    . magnesium sulfate 1 - 4 g bolus IVPB     PRN Meds:.acetaminophen **OR** acetaminophen, camphor-menthol **AND** hydrOXYzine, docusate sodium, HYDROmorphone, ipratropium-albuterol, ondansetron **OR** ondansetron (ZOFRAN) IV, sorbitol, zolpidem Medications Prior to Admission:  Prior to Admission medications   Medication Sig Start Date End Date Taking? Authorizing Provider  albuterol (PROAIR HFA) 108 (90 Base) MCG/ACT inhaler Inhale 1-2 puffs into the lungs every 6 (six) hours as needed for wheezing or shortness of breath.   Yes [provider]  aspirin 81 MG chewable tablet Chew 81 mg by mouth every morning.    Yes [provider]  atorvastatin (LIPITOR) 80 MG tablet Take 80 mg by mouth at bedtime.    Yes [provider]  CARBOPLATIN IV Inject into the vein every 21 ( twenty-one) days.    Yes [provider]  cilostazol (PLETAL) 50 MG tablet TAKE 1 TABLET(50 MG) BY MOUTH TWICE DAILY Patient taking differently: Take 50 mg by mouth 2 (two) times  daily.  08/09/18  Yes Lorretta Harp, MD  Coenzyme Q10 100 MG capsule Take 100 mg by mouth at bedtime.    Yes [provider]  Diphenhyd-Hydrocort-Nystatin (FIRST-DUKES MOUTHWASH) SUSP Use as directed 5 mLs in the mouth or throat 4 (four) times daily -  before meals and at bedtime. 11/16/18  Yes Derek Jack, MD  doxycycline (VIBRAMYCIN) 100 MG capsule Take 100 mg by mouth 2 (two) times daily. 10 day course starting on 11/22/2018 11/21/18  Yes [provider]  fentaNYL (DURAGESIC - DOSED MCG/HR) 50 MCG/HR Place 1 patch (50 mcg total) onto the skin every 3 (three) days. 11/12/18  Yes Lockamy, Randi L, NP-C  fluticasone (FLONASE) 50 MCG/ACT nasal spray Place 1 spray into both nostrils every evening.    Yes [provider]  Gemcitabine HCl (GEMZAR IV) Inject into the vein. Day 1 day 8 every 21 days.   Yes [provider]  HYDROmorphone (DILAUDID) 2 MG tablet Take 2 tablets (4 mg total) by mouth every 2 (two) hours as needed for severe pain. Patient taking differently: Take 4 mg by mouth every 2 (two) hours.  11/15/18  Yes Lockamy, Randi L, NP-C  ipratropium-albuterol (DUONEB) 0.5-2.5 (3) MG/3ML SOLN Take 3 mLs by nebulization every 6 (six) hours as needed (for shortness of breath).   Yes [provider]  lidocaine-prilocaine (EMLA) cream Apply a quarter size amount to affected area 1 hour prior to coming to chemotherapy.  Do not rub in.  Cover with plastic wrap. 01/30/18  Yes Derek Jack, MD  metFORMIN (GLUCOPHAGE) 500 MG tablet Take 500 mg by mouth  2 (two) times daily with a meal.    Yes [provider]  metoprolol succinate (TOPROL-XL) 50 MG 24 hr tablet Take 50 mg by mouth daily.  02/08/16  Yes [provider]  Omega-3 Fatty Acids (FISH OIL) 1000 MG CAPS Take 1 capsule by mouth at bedtime.    Yes [provider]  ondansetron (ZOFRAN) 8 MG tablet Take 1 tablet (8 mg total) by mouth every 8 (eight) hours as needed for  nausea or vomiting. 01/30/18  Yes Derek Jack, MD  pantoprazole (PROTONIX) 40 MG tablet Take 40 mg by mouth 2 (two) times daily.   Yes [provider]  predniSONE (DELTASONE) 20 MG tablet Take 20-40 mg by mouth See admin instructions. Take 40mg  on day 1 then take 20 mg until gone starting on 11/22/2018. (#13 tablets dispensed) 11/21/18  Yes [provider]  prochlorperazine (COMPAZINE) 10 MG tablet Take 1 tablet (10 mg total) by mouth every 6 (six) hours as needed for nausea or vomiting. 01/30/18  Yes Derek Jack, MD   Allergies  Allergen Reactions  . Lisinopril Anaphylaxis  . Penicillins Anaphylaxis    Has patient had a PCN reaction causing immediate rash, facial/tongue/throat swelling, SOB or lightheadedness with hypotension:Yes Has patient had a PCN reaction causing severe rash involving mucus membranes or skin necrosis:unsure Has patient had a PCN reaction that required hospitalization:unsure Has patient had a PCN reaction occurring within the last 10 years:~10 years per patient If all of the above answers are "NO", then may proceed with Cephalosporin use.   . Ace Inhibitors Swelling   Review of Systems  Unable to perform ROS: Other    Physical Exam Vitals signs and nursing note reviewed.     Vital Signs: BP 112/73   Pulse 94   Temp 97.9 F (36.6 C) (Oral)   Resp 16   Ht 5\' 9"  (1.753 m)   Wt 49.3 kg   SpO2 92%   BMI 16.05 kg/m  Pain Scale: 0-10   Pain Score: Asleep   SpO2: SpO2: 92 % O2 Device:SpO2: 92 % O2 Flow Rate: .   IO: Intake/output summary:   Intake/Output Summary (Last 24 hours) at 11/29/2018 1021 Last data filed at 11/29/2018 0900 Gross per 24 hour  Intake 1707.54 ml  Output -  Net 1707.54 ml    LBM: Last BM Date: 11/27/18 Baseline Weight: Weight: 47 kg Most recent weight: Weight: 49.3 kg     Palliative Assessment/Data:   Flowsheet Rows     Most Recent Value  Intake Tab  Referral Department  Hospitalist  Unit  at Time of Referral  Med/Surg Unit  Palliative Care Primary Diagnosis  Cancer  Date Notified  11/27/18  Palliative Care Type  New Palliative care  Reason for referral  Clarify Goals of Care  Date of Admission  11/26/18  Date first seen by Palliative Care  11/29/18  # of days Palliative referral response time  2 Day(s)  # of days IP prior to Palliative referral  1  Clinical Assessment  Palliative Performance Scale Score  50%  Pain Max last 24 hours  Not able to report  Pain Min Last 24 hours  Not able to report  Dyspnea Max Last 24 Hours  Not able to report  Dyspnea Min Last 24 hours  Not able to report  Psychosocial & Spiritual Assessment  Palliative Care Outcomes  Patient/Family meeting held?  Yes      Time In: 1000 Time Out: 1110 Time Total: 70 minutes  Greater than 50%  of this time was spent counseling and coordinating care related to the above assessment and plan.  Signed by: Drue Novel, NP   Please contact Palliative Medicine Team phone at [phone] for questions and concerns.  For individual provider: See Shea Evans

## 2018-11-29 NOTE — Progress Notes (Addendum)
Initial Nutrition Assessment  DOCUMENTATION CODES:  Non-severe (moderate) malnutrition in context of chronic illness, Underweight (feel listed ht may be overstated)  INTERVENTION:  D/C boost breeze  Ensure Enlive po QID, each supplement provides 350 kcal and 20 grams of protein (this is how many pt consumes at home)  Discussed tray additives and preferences to help promote intake->comminunicated to diet ambassador  NUTRITION DIAGNOSIS:  Moderate Malnutrition related to cancer and cancer related treatments as evidenced by loss of >30% bw x1 year and mild-moderate muscle/fat wasting  GOAL:  Patient will meet greater than or equal to 90% of their needs  MONITOR:  PO intake, Supplement acceptance, Diet advancement, Labs, I & O's, Weight trends,goals of care  REASON FOR ASSESSMENT:  Malnutrition Screening Tool    ASSESSMENT:  64 y/o male PMHx tobacco abuse, COPD, PAD, DM2, HLD/HTN, CAD, GERD, remote Prostate Cancer and active SCC of lung 10/2017. Has undergone palliative chemoradiation. Currently only on chemotherapy. Presented to ED d/t inability to pass urine. This was associated w/ abrupt anorexia and abdominal pain. Workup revealed AKI and pt admitted for management.   Pt well known to [facility] staff and the dietitians in particular. He receives ensure cases at each visit and routinely receives diet counseling to help maximize caloric/protein intake. He also had been tried on appetite stimulant. However, despite maximal nutrition interventions, pt continues to lose weight. This has largely been felt to be d/t progression of underlying cancer despite treatment. He has exhausted all non-aggressive nutrition interventions, such as PEG- (this would not be appropriate given clinical state).   When seen today, there is a large family presence in room w/ several others besides spouse present.   RD had ordered breeze for patient when he was first admitted on [facility] diet. Today, pt reports that he does  not like the [place] supplement nearly as much as he does the Ensure. Yesterday, pt had refused all meals and today pt says this was because he had no appetite. Today, he has eaten 25-50% of his CL meals.   PTA, family members say he had eaten poorly for ~ 1 week. They give examples of some of the meals he has been eating: mashed potatoes and gravy, black eyed peas, or a half can of soup. Surprisingly, spouse says he still has been drinking 4 Ensure [employer] each day. These alone equate to 1400 kcals and 80 g Pro. He has relied on ensure supplements greatly over the past 6 months, hence why he has been receiving ensure assistance at center.   Weight wise, he was admitted at 103 lbs 10 oz and, though he was dehydrated, this is only a pound difference from his last [facility] weight on 1/17 of 104 lb 10 oz. In the short term, he has lost 10 lbs (8.5%) x 1 month.  Long term, he has lost ~35 lbs (25% bw) in 6 months and ~60 lbs (~35% bw) in 12 months.    RD sought out food items that pt and family thought pt would eat. They all agree that pts taste preferences change abruptly and he often requests something, but when it is brought to him, he wont eat it. A consensus was reached that mashed potatoes and gravy is a preferred item. Will order with L+ D. Will continue with oral supplements - both pt/spouse feel pt can still drink 4 each day. Will order QID.    Labs: Bgs: 85-135, WBC:10.7, Na: 125->130, Calcium 5.4-6.0- (non adjusted) since admit, Albumin:2.4, Creat: 9.31->8.72.  A1c on 1/27: 7.2 Meds: Calcium Carb, Diamox, Boost Breeze, Fentanyl patch, mag ox, ppi, IVF, IV calcium   Recent Labs  Lab 11/27/18 1551 11/28/18 0704 11/29/18 0515  NA 125* 129*  128* 130*  K 3.6 3.6  3.7 3.2*  CL 77* 73*  73* 78*  CO2 22 29  29  32  BUN 116* 114*  114* 116*  CREATININE 9.20* 8.83*  8.76* 8.72*  CALCIUM 5.7* 6.0*  6.0* 5.4*  MG  --   --  1.3*  PHOS  --  4.5 5.0*  GLUCOSE 168* 141*  140* 106*    NUTRITION - FOCUSED PHYSICAL EXAM:   Most Recent Value  Orbital Region  Mild depletion  Upper Arm Region  Moderate depletion  Thoracic and Lumbar Region  Moderate depletion  Buccal Region  No depletion  Temple Region  Mild depletion  Clavicle Bone Region  Moderate depletion  Clavicle and Acromion Bone Region  Mild depletion  Scapular Bone Region  No depletion  Dorsal Hand  No depletion  Patellar Region  Mild depletion  Anterior Thigh Region  Mild depletion  Posterior Calf Region  Mild depletion  Edema (RD Assessment)  None  Hair  Reviewed  Eyes  Reviewed  Mouth  Reviewed  Skin  Reviewed  Nails  Reviewed     Diet Order:   Diet Order            DIET SOFT Room service appropriate? Yes; Fluid consistency: Thin  Diet effective now             EDUCATION NEEDS:  No education needs have been identified at this time  Skin:  Skin Assessment: Reviewed RN Assessment  Last BM:  1/28  Height:  Ht Readings from Last 1 Encounters:  11/26/18 5\' 9"  (1.753 m)   Weight:  Wt Readings from Last 1 Encounters:  11/26/18 49.3 kg   Wt Readings from Last 10 Encounters:  11/26/18 49.3 kg  11/16/18 47.4 kg  11/09/18 49.7 kg  10/26/18 52.1 kg  10/19/18 54.2 kg  10/04/18 55.5 kg  09/19/18 59 kg  09/05/18 60.8 kg  08/22/18 63.4 kg  08/08/18 61.8 kg   Ideal Body Weight:  72.73 kg  BMI:  Body mass index is 16.05 kg/m.  Estimated Nutritional Needs:  Kcal:  >1900 kcals (40 kcal/kg bw) Protein:  >95 g Pro (2g/kg bw) Fluid:  >1.4 L (30 ml/kg bw)  Burtis Junes RD, LDN, CNSC Clinical Nutrition Available Tues-Sat via Pager: [phone] 11/29/2018 1:23 PM

## 2018-11-30 ENCOUNTER — Ambulatory Visit (HOSPITAL_COMMUNITY): Payer: Medicare Other | Admitting: Hematology

## 2018-11-30 ENCOUNTER — Ambulatory Visit (HOSPITAL_COMMUNITY): Payer: Medicare Other

## 2018-11-30 ENCOUNTER — Other Ambulatory Visit (HOSPITAL_COMMUNITY): Payer: Medicare Other

## 2018-11-30 ENCOUNTER — Encounter (HOSPITAL_COMMUNITY): Payer: Medicare Other

## 2018-11-30 LAB — GLUCOSE, CAPILLARY
GLUCOSE-CAPILLARY: 210 mg/dL — AB (ref 70–99)
Glucose-Capillary: 109 mg/dL — ABNORMAL HIGH (ref 70–99)
Glucose-Capillary: 98 mg/dL (ref 70–99)
Glucose-Capillary: 66 mg/dL — ABNORMAL LOW (ref 70–99)
Glucose-Capillary: 142 mg/dL — ABNORMAL HIGH (ref 70–99)

## 2018-11-30 LAB — BASIC METABOLIC PANEL
Anion gap: 19 — ABNORMAL HIGH (ref 5–15)
BUN: 100 mg/dL — ABNORMAL HIGH (ref 8–23)
CO2: 24 mmol/L (ref 22–32)
Calcium: 6.7 mg/dL — ABNORMAL LOW (ref 8.9–10.3)
Chloride: 88 mmol/L — ABNORMAL LOW (ref 98–111)
Creatinine, Ser: 7.99 mg/dL — ABNORMAL HIGH (ref 0.61–1.24)
GFR calc non Af Amer: 7 mL/min — ABNORMAL LOW (ref >60)
GFR, EST AFRICAN AMERICAN: 8 mL/min — AB (ref >60)
Glucose, Bld: 95 mg/dL (ref 70–99)
Potassium: 3.5 mmol/L (ref 3.5–5.1)
Sodium: 131 mmol/L — ABNORMAL LOW (ref 135–145)

## 2018-11-30 LAB — RENAL FUNCTION PANEL
Albumin: 2.1 g/dL — ABNORMAL LOW (ref 3.5–5.0)
Anion gap: 19 — ABNORMAL HIGH (ref 5–15)
BUN: 103 mg/dL — ABNORMAL HIGH (ref 8–23)
CO2: 26 mmol/L (ref 22–32)
Calcium: 5.9 mg/dL — CL (ref 8.9–10.3)
Chloride: 85 mmol/L — ABNORMAL LOW (ref 98–111)
Creatinine, Ser: 8.2 mg/dL — ABNORMAL HIGH (ref 0.61–1.24)
GFR calc Af Amer: 7 mL/min — ABNORMAL LOW (ref >60)
GFR calc non Af Amer: 6 mL/min — ABNORMAL LOW (ref >60)
Glucose, Bld: 97 mg/dL (ref 70–99)
POTASSIUM: 3.2 mmol/L — AB (ref 3.5–5.1)
Phosphorus: 4.1 mg/dL (ref 2.5–4.6)
Sodium: 130 mmol/L — ABNORMAL LOW (ref 135–145)

## 2018-11-30 LAB — MAGNESIUM: Magnesium: 1.9 mg/dL (ref 1.7–2.4)

## 2018-11-30 LAB — VITAMIN D 25 HYDROXY (VIT D DEFICIENCY, FRACTURES): Vit D, 25-Hydroxy: 20 ng/mL — ABNORMAL LOW (ref 30.0–100.0)

## 2018-11-30 LAB — PARATHYROID HORMONE, INTACT (NO CA): PTH: 135 pg/mL — ABNORMAL HIGH (ref 15–65)

## 2018-11-30 MED ORDER — POTASSIUM CHLORIDE CRYS ER 20 MEQ PO TBCR
40.0000 meq | EXTENDED_RELEASE_TABLET | Freq: Once | ORAL | Status: AC
  Administered 2018-11-30: 40 meq via ORAL
  Filled 2018-11-30: qty 2

## 2018-11-30 MED ORDER — MIRTAZAPINE 15 MG PO TABS
7.5000 mg | ORAL_TABLET | Freq: Every day | ORAL | Status: DC
  Administered 2018-11-30 – 2018-12-05 (×5): 7.5 mg via ORAL
  Filled 2018-11-30 (×6): qty 1

## 2018-11-30 MED ORDER — CALCIUM GLUCONATE-NACL 1-0.675 GM/50ML-% IV SOLN
1.0000 g | Freq: Once | INTRAVENOUS | Status: AC
  Administered 2018-11-30: 1000 mg via INTRAVENOUS
  Filled 2018-11-30: qty 50

## 2018-11-30 MED ORDER — POTASSIUM CHLORIDE CRYS ER 20 MEQ PO TBCR
40.0000 meq | EXTENDED_RELEASE_TABLET | Freq: Three times a day (TID) | ORAL | Status: DC
  Administered 2018-11-30 (×2): 40 meq via ORAL
  Filled 2018-11-30 (×3): qty 2

## 2018-11-30 MED ORDER — CALCIUM GLUCONATE-NACL 1-0.675 GM/50ML-% IV SOLN: 1.0000 g | Freq: Once | INTRAVENOUS | Status: DC

## 2018-11-30 MED ORDER — CALCITRIOL 0.25 MCG PO CAPS
0.5000 ug | ORAL_CAPSULE | Freq: Every day | ORAL | Status: AC
  Administered 2018-11-30 – 2018-12-03 (×4): 0.5 ug via ORAL
  Filled 2018-11-30 (×4): qty 2

## 2018-11-30 NOTE — Care Management Note (Signed)
Case Management Note  Patient Details  Name: Fred Hernandez MRN: [id number] Date of Birth: 1956/03/19  Subjective/Objective:  Admitted with acute renal failure and electrolyte abnormalities secondary to chemo drug. Pt from home, lives with significant other. Two sisters at the bedside today. PT has recommended HH PT and pt agreeable. CMS provider list given and pt has no preference.                   Action/Plan: Referral given to [facility] rep. DC this weekend not anticipated. CM will cont to follow and update [facility].  Expected Discharge Date:    12/03/2018              Expected Discharge Plan:  [place]  In-House Referral:  Hospice / Palliative Care  Discharge planning Services  CM Consult  Post Acute Care Choice:  Home Health Choice offered to:  Patient  HH Arranged:  RN, PT [facility] Agency:  [place]  Status of Service:  In process, will continue to follow  Sherald Barge, RN 11/30/2018, 12:01 PM

## 2018-11-30 NOTE — Evaluation (Signed)
Physical Therapy Evaluation Patient Details Name: Fred Hernandez MRN: [id number] DOB: [birth year] Today's Date: 11/30/2018   History of Present Illness  Fred Hernandez is a 63 y.o. 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  He came to the ER because he couldn't pee.  He hasn't been able to eat and drink.  He peed once today.  His girlfriend has been getting on him - he has been declining in his PO intake x 3-4 weeks.  He just suddenly didn't feel like eating - despite previously eating 4 times a day.  He was 148 in 2/19; he was 109 the last time he was weighed at chemo.  +tobacco - 1/2 ppd.  Severe anorexia, no nausea.  He doesn't like the Marinol because it makes him feel high and so he won't take them.  He doesn't have difficulty with urination other than small and infrequent voids.  No fever.  Mild cough.    Clinical Impression  Patient functioning near baseline for functional mobility and gait, slightly unsteady ambulation with wider than normal base of support during gait training, improvement noted after 2-3 minutes of walking, limited secondary to c/o fatigue and tolerated sitting up in chair after therapy.  Patient will benefit from continued physical therapy in hospital and recommended venue below to increase strength, balance, endurance for safe ADLs and gait.    Follow Up Recommendations Home health PT;Supervision - Intermittent    Equipment Recommendations  None recommended by PT    Recommendations for Other Services       Precautions / Restrictions Precautions Precautions: Fall Restrictions Weight Bearing Restrictions: No      Mobility  Bed Mobility Overal bed mobility: Modified Independent             General bed mobility comments: increased time  Transfers Overall transfer level: Needs assistance Equipment used:  None;1 person hand held assist Transfers: Sit to/from Stand;Stand Pivot Transfers Sit to Stand: Supervision Stand pivot transfers: Supervision       General transfer comment: slightly labored movement  Ambulation/Gait Ambulation/Gait assistance: Supervision;Min guard Gait Distance (Feet): 100 Feet Assistive device: None;1 person hand held assist Gait Pattern/deviations: Decreased step length - right;Decreased step length - left;Decreased stride length;Wide base of support Gait velocity: decreased   General Gait Details: slightly unsteady labored cadence without loss of balance, wider base of support than normal, limited secondary to c/o fatigue  Stairs            Wheelchair Mobility    Modified Rankin (Stroke Patients Only)       Balance Overall balance assessment: Mild deficits observed, not formally tested                                           Pertinent Vitals/Pain Pain Assessment: No/denies pain    Home Living Family/patient expects to be discharged to:: Private residence Living Arrangements: Non-relatives/Friends(girlfriend) Available Help at Discharge: Family;Friend(s) Type of Home: Mobile home Home Access: Stairs to enter Entrance Stairs-Rails: Right;Left;Can reach both Entrance Stairs-Number of Steps: 2-3 Home Layout: One level Home Equipment: None      Prior Function Level of Independence: Independent         Comments: community ambulator, drives  Hand Dominance        Extremity/Trunk Assessment   Upper Extremity Assessment Upper Extremity Assessment: Generalized weakness    Lower Extremity Assessment Lower Extremity Assessment: Generalized weakness    Cervical / Trunk Assessment Cervical / Trunk Assessment: Normal  Communication   Communication: No difficulties  Cognition Arousal/Alertness: Awake/alert Behavior During Therapy: WFL for tasks assessed/performed Overall Cognitive Status: Within Functional  Limits for tasks assessed                                        General Comments      Exercises     Assessment/Plan    PT Assessment Patient needs continued PT services  PT Problem List Decreased strength;Decreased activity tolerance;Decreased balance;Decreased mobility       PT Treatment Interventions Gait training;Stair training;Functional mobility training;Therapeutic activities;Patient/family education;Therapeutic exercise    PT Goals (Current goals can be found in the Care Plan section)  Acute Rehab PT Goals Patient Stated Goal: return home with family/girl friend to assist PT Goal Formulation: With patient/family Time For Goal Achievement: 12/07/18 Potential to Achieve Goals: Good    Frequency Min 3X/week   Barriers to discharge        Co-evaluation               AM-PAC PT "6 Clicks" Mobility  Outcome Measure Help needed turning from your back to your side while in a flat bed without using bedrails?: None Help needed moving from lying on your back to sitting on the side of a flat bed without using bedrails?: None Help needed moving to and from a bed to a chair (including a wheelchair)?: A Little Help needed standing up from a chair using your arms (e.g., wheelchair or bedside chair)?: A Little Help needed to walk in hospital room?: A Little Help needed climbing 3-5 steps with a railing? : A Little 6 Click Score: 20    End of Session Equipment Utilized During Treatment: Gait belt Activity Tolerance: Patient tolerated treatment well;Patient limited by fatigue Patient left: in chair;with call bell/phone within reach Nurse Communication: Mobility status PT Visit Diagnosis: Unsteadiness on feet (R26.81);Muscle weakness (generalized) (M62.81)    Time: 0109-3235 PT Time Calculation (min) (ACUTE ONLY): 23 min   Charges:   PT Evaluation $PT Eval Moderate Complexity: 1 Mod PT Treatments $Gait Training: 8-22 mins        12:28 PM,  11/30/18 [name], MPT Physical Therapist with [facility] [phone] office [phone] mobile phone

## 2018-11-30 NOTE — Progress Notes (Signed)
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 Patient came to the ED due to inability to urinate. His oral intake has been poor and progressively decreasing. Family at bedside reported he was vomiting at home and spitting while in the hospital. Denies nausea, fevers, chest pain, SOB, abdominal discomfort, melena, hematochezia.  Patient was admitted with AKI secondary likely to ATN with Zometa use and persistent hypocalcemia as well as other electrolyte derangements.  Appreciate nephrology assistance.  Assessment & Plan:   Principal Problem:   Acute renal failure (ARF) (HCC) Active Problems:   Essential hypertension   Diabetes mellitus with circulatory complication (HCC)   Tobacco abuse   Squamous cell carcinoma of right lung (HCC)   Anemia associated with chemotherapy   Palliative care by specialist   DNR (do not resuscitate) discussion  Acute renal failure-persistent and likely secondary to ATN -Creatinineyesterday was8.72 and today is 8.2 -Likely due to ATN with Zometa use -renal US also demonstrated distended bladder, but no hydronephrosis -Continue followingup renal function panel -continue IVF with normal saline -Continue monitor strict I's and O's with urine output of 2100 in the last 24 hours. -Appreciate nephrology assistance and it appears to likely be related to Zometa  Lung CA/with chronic pain -NSCLC diagnosed in 2/19 -Continuefentanyl patch (50 mcg) as well as Dilaudid 2 mg PO q2h. -Continue [facility] mouthwash forthrush -will hold chemotherapy while inpatient -tobacco cessation encouraged -Appreciate palliative care discussions for goals of care  Cachexia -We will start trial of Remeron today  HTN -Continue Toprol  XL. -BPcontinues to remain controlled and will resume home medications once there is an increase.  DM -Continue following glucose trend -ContinueholdingGlucophage while inpatient -will use SSI  Anemia -stable -will check repeat CBC as needed if there is any overt bleeding  Tobacco dependence -I have discussed tobacco cessation with the patient. I have counseled the patient regarding the negative impacts of continued tobacco use including but not limited to lung cancer, COPD, and cardiovascular disease. I have discussed alternatives to tobacco and modalities that may help facilitate tobacco cessation including but not limited to biofeedback, hypnosis, and medications. Total time spent with tobacco counseling was4 minutes. -Continue the use of nicotine patch  Hypocalcemia-persistent -Continue replacement with calcium gluconate -Noted to have elevated parathyroid hormone levels -Continue to monitor and appreciate nephrology assistance  Mild hypokalemia -Magnesium levels have normalized and will replete orally and recheck labs in a.m.   DVT prophylaxis:heparin Code Status:FULL Family Communication: Fianc at bedside Disposition Plan:home once clinically improved  Consultants:  Nephrology   Palliative Care  Procedures:  None  Antimicrobials:    Anti-infectives(From admission, onward)   None      Subjective: Patient seen and evaluated today with no new acute complaints or concerns. No acute concerns or events noted overnight.  He states that he is feeling well enough to go home, but continues to struggle with his appetite.  This is slowly improving, however, but is not back to baseline.  Objective: Vitals:   11/29/18 0557 11/29/18 1425 11/29/18 2106 11/30/18 0451  BP: 112/73 111/73 136/83 (!) 122/92  Pulse: 94 95 98 (!) 109  Resp: 16 16 16 17   Temp: 97.9 F (36.6 C) 98.5 F (36.9 C) 97.7 F (  36.5 C) 97.6 F (36.4 C)  TempSrc: Oral  Oral Oral Oral  SpO2: 92% 96% 96% 96%  Weight:      Height:        Intake/Output Summary (Last 24 hours) at 11/30/2018 0924 Last data filed at 11/30/2018 0617 Gross per 24 hour  Intake 711.29 ml  Output 2100 ml  Net -1388.71 ml   Filed Weights   11/26/18 1337 11/26/18 2057  Weight: 47 kg 49.3 kg    Examination:  General exam: Appears calm and comfortable  Respiratory system: Clear to auscultation. Respiratory effort normal. Cardiovascular system: S1 & S2 heard, RRR. No JVD, murmurs, rubs, gallops or clicks. No pedal edema. Gastrointestinal system: Abdomen is nondistended, soft and nontender. No organomegaly or masses felt. Normal bowel sounds heard. Central nervous system: Alert and oriented. No focal neurological deficits. Extremities: Symmetric 5 x 5 power. Skin: No rashes, lesions or ulcers Psychiatry: Judgement and insight appear normal. Mood & affect appropriate.     Data Reviewed: I have personally reviewed following labs and imaging studies  CBC: Recent Labs  Lab 11/26/18 1550 11/27/18 0415 11/29/18 0515  WBC 14.8* 12.9* 10.7*  NEUTROABS 12.9*  --   --   HGB 9.1* 8.0* 8.0*  HCT 29.7* 24.3* 23.8*  MCV 85.6 80.5 78.5*  PLT 518* 471* 102   Basic Metabolic Panel: Recent Labs  Lab 11/27/18 0415 11/27/18 1551 11/28/18 0704 11/29/18 0515 11/30/18 0508  NA 129* 125* 129*  128* 130* 130*  K 4.6 3.6 3.6  3.7 3.2* 3.2*  CL 85* 77* 73*  73* 78* 85*  CO2 18* 22 29  29  32 26  GLUCOSE 149* 168* 141*  140* 106* 97  BUN 66* 116* 114*  114* 116* 103*  CREATININE 8.97* 9.20* 8.83*  8.76* 8.72* 8.20*  CALCIUM 5.9* 5.7* 6.0*  6.0* 5.4* 5.9*  MG  --   --   --  1.3* 1.9  PHOS  --   --  4.5 5.0* 4.1   GFR: Estimated Creatinine Clearance: 6.5 mL/min (A) (by C-G formula based on SCr of 8.2 mg/dL (H)). Liver Function Tests: Recent Labs  Lab 11/26/18 1550 11/28/18 0704 11/30/18 0508  AST 36  --   --   ALT 37  --   --   ALKPHOS 82  --   --   BILITOT 0.6  --    --   PROT 7.3  --   --   ALBUMIN 2.8* 2.4* 2.1*   Recent Labs  Lab 11/26/18 1550  LIPASE 21   No results for input(s): AMMONIA in the last 168 hours. Coagulation Profile: No results for input(s): INR, PROTIME in the last 168 hours. Cardiac Enzymes: Recent Labs  Lab 11/26/18 1550  TROPONINI <0.03   BNP (last 3 results) No results for input(s): PROBNP in the last 8760 hours. HbA1C: No results for input(s): HGBA1C in the last 72 hours. CBG: Recent Labs  Lab 11/29/18 0726 11/29/18 1110 11/29/18 1610 11/29/18 2110 11/30/18 0719  GLUCAP 96 138* 123* 96 109*   Lipid Profile: No results for input(s): CHOL, HDL, LDLCALC, TRIG, CHOLHDL, LDLDIRECT in the last 72 hours. Thyroid Function Tests: No results for input(s): TSH, T4TOTAL, FREET4, T3FREE, THYROIDAB in the last 72 hours. Anemia Panel: No results for input(s): VITAMINB12, FOLATE, FERRITIN, TIBC, IRON, RETICCTPCT in the last 72 hours. Sepsis Labs: No results for input(s): PROCALCITON, LATICACIDVEN in the last 168 hours.  Recent Results (from the past 240 hour(s))  Urine culture  Status: None   Collection Time: 11/26/18  2:09 PM  Result Value Ref Range Status   Specimen Description   Final    URINE, CATHETERIZED Performed at [facility], [address], [place], [place] [address]    Special Requests   Final    Normal Performed at [facility], [address], [place], [place] [address]    Culture   Final    NO GROWTH Performed at [facility], [place] [address], [place], [place] [address]    Report Status 11/29/2018 FINAL  Final         Radiology Studies: No results found.      Scheduled Meds: . aspirin  81 mg Oral q morning - 10a  . atorvastatin  80 mg Oral QHS  . calcium carbonate  1,000 mg of elemental calcium Oral TID WC  . chlorhexidine  15 mL Mouth Rinse BID  . cilostazol  50 mg Oral BID  . feeding supplement (ENSURE ENLIVE)  237 mL Oral QID  . fentaNYL  1 patch Transdermal Q72H  .  fluticasone  1 spray Each Nare QPM  . heparin injection (subcutaneous)  5,000 Units Subcutaneous Q8H  . insulin aspart  0-9 Units Subcutaneous TID WC  . magic mouthwash  5 mL Oral TID AC & HS  . magnesium oxide  400 mg Oral BID  . mouth rinse  15 mL Mouth Rinse q12n4p  . metoprolol succinate  50 mg Oral Daily  . nicotine  14 mg Transdermal Daily  . pantoprazole  40 mg Oral BID  . potassium chloride  40 mEq Oral Once   Continuous Infusions: . sodium chloride 100 mL/hr at 11/30/18 0908  . calcium gluconate     Followed by  . calcium gluconate       LOS: 4 days    Time spent: 30 minutes    Jaylyne Breese Darleen Crocker, DO [facility] Pager [phone]  If 7PM-7AM, please contact night-coverage [email or web address] Password [facility] 11/30/2018, 9:24 AM

## 2018-11-30 NOTE — Plan of Care (Signed)
  Problem: Acute Rehab PT Goals(only PT should resolve) Goal: Pt Will Go Supine/Side To Sit Flowsheets (Taken 11/30/2018 1229) Pt will go Supine/Side to Sit: Independently Goal: Patient Will Transfer Sit To/From Stand Flowsheets (Taken 11/30/2018 1229) Patient will transfer sit to/from stand: with modified independence Goal: Pt Will Transfer Bed To Chair/Chair To Bed Flowsheets (Taken 11/30/2018 1229) Pt will Transfer Bed to Chair/Chair to Bed: with modified independence Goal: Pt Will Ambulate Flowsheets (Taken 11/30/2018 1229) Pt will Ambulate: > 125 feet; with modified independence   12:30 PM, 11/30/18 [name], MPT Physical Therapist with [facility] [phone] office [phone] mobile phone

## 2018-11-30 NOTE — Progress Notes (Signed)
[facility] NEPHROLOGY PROGRESS NOTE  Assessment/ Plan: Pt is a 63 y.o. 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  #Acute kidney injury suspected ATN due to multifactorial etiology including zoledronic acid, NSAIDs, dehydration due to hypercalcemia and poor oral intake.  Patient with normal baseline serum creatinine level.  He was admitted with peaked creatinine level of 9.3 which is trending down to 8.20 today.  He is nonoliguric with urine output 2.1 L in 24 hours.  UA likely contaminant.  The US renal with bilateral cortical echogenicity, no hydronephrosis. -Recommend to continue IV fluid, encourage oral intake, monitor BMP and urine output.  Avoid nephrotoxins including NSAIDs or IV contrast.  He has Foley catheter. -Watch for renal recovery.  Patient is a poor candidate for long-term dialysis if renal function worsen.  I have discussed this with the patient and family members.  #Hypocalcemia: Due to Zoledronic acid, corrected calcium 7.42.  Continue calcium carbonate and also receiving IV calcium gluconate.  Given high PTH, I will start calcitriol.  #Hypokalemia, hypomagnesemia: Replete potassium chloride and magnesium.  Monitor labs.  #Squamous cell cancer of lung: Was receiving chemotherapy with carboplatin and gemcitabine.  Management per oncologist  #Severe protein calorie malnutrition, failure to thrive: Recommend dietary evaluation, encourage oral intake.  We will continue to follow through the weekend.  I discussed with the palliative care team and the primary team.    Subjective: Seen and examined at bedside.  Patient denied headache, nausea, vomiting, chest pain, shortness of breath.  He reports some weakness and lightheadedness.  His sisters at bedside. Objective Vital signs  in last 24 hours: Vitals:   11/29/18 0557 11/29/18 1425 11/29/18 2106 11/30/18 0451  BP: 112/73 111/73 136/83 (!) 122/92  Pulse: 94 95 98 (!) 109  Resp: 16 16 16 17   Temp: 97.9 F (36.6 C) 98.5 F (36.9 C) 97.7 F (36.5 C) 97.6 F (36.4 C)  TempSrc: Oral Oral Oral Oral  SpO2: 92% 96% 96% 96%  Weight:      Height:       Weight change:   Intake/Output Summary (Last 24 hours) at 11/30/2018 1049 Last data filed at 11/30/2018 1009 Gross per 24 hour  Intake 831.29 ml  Output 2100 ml  Net -1268.71 ml       Labs: Basic Metabolic Panel: Recent Labs  Lab 11/28/18 0704 11/29/18 0515 11/30/18 0508  NA 129*  128* 130* 130*  K 3.6  3.7 3.2* 3.2*  CL 73*  73* 78* 85*  CO2 29  29 32 26  GLUCOSE 141*  140* 106* 97  BUN 114*  114* 116* 103*  CREATININE 8.83*  8.76* 8.72* 8.20*  CALCIUM 6.0*  6.0* 5.4* 5.9*  PHOS 4.5 5.0* 4.1   Liver Function Tests: Recent Labs  Lab 11/26/18 1550 11/28/18 0704 11/30/18 0508  AST 36  --   --   ALT 37  --   --   ALKPHOS 82  --   --   BILITOT 0.6  --   --   PROT 7.3  --   --   ALBUMIN 2.8* 2.4* 2.1*   Recent Labs  Lab 11/26/18 1550  LIPASE 21   No results for input(s): AMMONIA in the last 168 hours. CBC: Recent Labs  Lab 11/26/18 1550 11/27/18 0415 11/29/18 0515  WBC 14.8* 12.9* 10.7*  NEUTROABS 12.9*  --   --   HGB 9.1* 8.0* 8.0*  HCT 29.7* 24.3* 23.8*  MCV 85.6 80.5 78.5*  PLT 518* 471* 383   Cardiac Enzymes: Recent Labs  Lab 11/26/18 1550  TROPONINI <0.03   CBG: Recent Labs  Lab 11/29/18 0726 11/29/18 1110 11/29/18 1610 11/29/18 2110 11/30/18 0719  GLUCAP 96 138* 123* 96 109*    Iron Studies: No results for input(s): IRON, TIBC, TRANSFERRIN, FERRITIN in the last 72 hours. Studies/Results: No results found.  Medications: Infusions: . sodium chloride 100 mL/hr at 11/30/18 0908  . calcium gluconate 1,000 mg (11/30/18 1030)   Followed by  . calcium gluconate      Scheduled Medications: .  aspirin  81 mg Oral q morning - 10a  . atorvastatin  80 mg Oral QHS  . calcitRIOL  0.5 mcg Oral Daily  . calcium carbonate  1,000 mg of elemental calcium Oral TID WC  . chlorhexidine  15 mL Mouth Rinse BID  . cilostazol  50 mg Oral BID  . feeding supplement (ENSURE ENLIVE)  237 mL Oral QID  . fentaNYL  1 patch Transdermal Q72H  . fluticasone  1 spray Each Nare QPM  . heparin injection (subcutaneous)  5,000 Units Subcutaneous Q8H  . insulin aspart  0-9 Units Subcutaneous TID WC  . magic mouthwash  5 mL Oral TID AC & HS  . magnesium oxide  400 mg Oral BID  . mouth rinse  15 mL Mouth Rinse q12n4p  . metoprolol succinate  50 mg Oral Daily  . mirtazapine  7.5 mg Oral QHS  . nicotine  14 mg Transdermal Daily  . pantoprazole  40 mg Oral BID  . potassium chloride  40 mEq Oral TID    have reviewed scheduled and prn medications.  Physical Exam: General: Thin, cachectic male sitting on chair comfortable  Heart:RRR, s1s2 nl, no rubs Lungs:clear b/l, no crackle Abdomen:soft, Non-tender, non-distended Extremities:No edema Neurology: Alert, awake, following commands  Lyrick Lagrand Prasad Tristain Daily 11/30/2018,10:49 AM  LOS: 4 days

## 2018-11-30 NOTE — Progress Notes (Signed)
CRITICAL VALUE ALERT  Critical Value:  Calcium 5.9  Date & Time Notied:  11/30/2018 6681  Provider Notified: Olevia Bowens  Orders Received/Actions taken:

## 2018-12-01 LAB — BASIC METABOLIC PANEL
Anion gap: 15 (ref 5–15)
Anion gap: 17 — ABNORMAL HIGH (ref 5–15)
BUN: 91 mg/dL — AB (ref 8–23)
BUN: 86 mg/dL — AB (ref 8–23)
CALCIUM: 6.4 mg/dL — AB (ref 8.9–10.3)
CO2: 23 mmol/L (ref 22–32)
CO2: 21 mmol/L — ABNORMAL LOW (ref 22–32)
CREATININE: 7.35 mg/dL — AB (ref 0.61–1.24)
Calcium: 6.5 mg/dL — ABNORMAL LOW (ref 8.9–10.3)
Chloride: 93 mmol/L — ABNORMAL LOW (ref 98–111)
Chloride: 95 mmol/L — ABNORMAL LOW (ref 98–111)
Creatinine, Ser: 7.09 mg/dL — ABNORMAL HIGH (ref 0.61–1.24)
GFR calc Af Amer: 8 mL/min — ABNORMAL LOW (ref >60)
GFR calc Af Amer: 9 mL/min — ABNORMAL LOW (ref >60)
GFR calc non Af Amer: 7 mL/min — ABNORMAL LOW (ref >60)
GFR calc non Af Amer: 8 mL/min — ABNORMAL LOW (ref >60)
Glucose, Bld: 100 mg/dL — ABNORMAL HIGH (ref 70–99)
Glucose, Bld: 155 mg/dL — ABNORMAL HIGH (ref 70–99)
Potassium: 3.8 mmol/L (ref 3.5–5.1)
Potassium: 3.7 mmol/L (ref 3.5–5.1)
Sodium: 131 mmol/L — ABNORMAL LOW (ref 135–145)
Sodium: 133 mmol/L — ABNORMAL LOW (ref 135–145)

## 2018-12-01 LAB — GLUCOSE, CAPILLARY
Glucose-Capillary: 137 mg/dL — ABNORMAL HIGH (ref 70–99)
Glucose-Capillary: 119 mg/dL — ABNORMAL HIGH (ref 70–99)

## 2018-12-01 LAB — MAGNESIUM: Magnesium: 1.8 mg/dL (ref 1.7–2.4)

## 2018-12-01 MED ORDER — CALCIUM GLUCONATE-NACL 1-0.675 GM/50ML-% IV SOLN
1.0000 g | INTRAVENOUS | Status: AC
  Administered 2018-12-01: 1000 mg via INTRAVENOUS
  Filled 2018-12-01: qty 50

## 2018-12-01 MED ORDER — POTASSIUM CHLORIDE CRYS ER 20 MEQ PO TBCR
40.0000 meq | EXTENDED_RELEASE_TABLET | Freq: Two times a day (BID) | ORAL | Status: AC
  Administered 2018-12-01 – 2018-12-02 (×3): 40 meq via ORAL
  Filled 2018-12-01 (×3): qty 2

## 2018-12-01 MED ORDER — METOPROLOL TARTRATE 5 MG/5ML IV SOLN
5.0000 mg | Freq: Once | INTRAVENOUS | Status: AC
  Administered 2018-12-01: 5 mg via INTRAVENOUS
  Filled 2018-12-01: qty 5

## 2018-12-01 NOTE — Progress Notes (Addendum)
[facility] NEPHROLOGY PROGRESS NOTE  Assessment/ Plan: Pt is a 63 y.o. 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  #Acute kidney injury suspected ATN due to multifactorial etiology including zoledronic acid, NSAIDs, dehydration due to hypercalcemia and poor oral intake.  Patient with normal baseline serum creatinine level.  He was admitted with peaked creatinine level of 9.3 which is trending down to 7.35 today.  He is nonoliguric with urine output 2.8 L in 24 hours.  UA likely contaminant.  The US renal with bilateral cortical echogenicity, no hydronephrosis. -Recommend to continue IV fluid, encourage oral intake, monitor BMP and urine output.  Avoid nephrotoxins including NSAIDs or IV contrast.  He has Foley catheter. -recovering AKI. Encourage oral intake.  #Hypocalcemia: Due to Zoledronic acid.  Continue calcium carbonate and calcitriol.Ordered IV calcium gluconate  #Hypokalemia, hypomagnesemia: Continue to replete KCL, reduce to bid. Mg acceptable.  Monitor labs.  #Squamous cell cancer of lung: Was receiving chemotherapy with carboplatin and gemcitabine.  Management per oncologist  #Severe protein calorie malnutrition, failure to thrive: Recommend dietary evaluation, encourage oral intake.  Patient has recovering AKI. We will see him back on Monday. If your have any question tomorrow, please contact Dr. Joelyn Oms [phone]).   Subjective: Seen and examined at bedside.no new event. No chest pain, SOB, nausea or vomiting. His fiance at bedside.   Objective Vital signs in last 24 hours: Vitals:   11/30/18 0451 11/30/18 1315 11/30/18 2121 12/01/18 0517  BP: (!) 122/92 107/76 122/87 123/90  Pulse: (!) 109 (!) 110 95 (!) 126  Resp: 17 17 20 20   Temp: 97.6 F (36.4 C) (!) 97.5 F  (36.4 C) 98.4 F (36.9 C) (!) 97.5 F (36.4 C)  TempSrc: Oral Oral Oral Oral  SpO2: 96% 92% 100% 98%  Weight:      Height:       Weight change:   Intake/Output Summary (Last 24 hours) at 12/01/2018 1057 Last data filed at 12/01/2018 1020 Gross per 24 hour  Intake 840 ml  Output 3750 ml  Net -2910 ml       Labs: Basic Metabolic Panel: Recent Labs  Lab 11/28/18 0704 11/29/18 0515 11/30/18 0508 11/30/18 1714 12/01/18 0534  NA 129*  128* 130* 130* 131* 131*  K 3.6  3.7 3.2* 3.2* 3.5 3.8  CL 73*  73* 78* 85* 88* 93*  CO2 29  29 32 26 24 23   GLUCOSE 141*  140* 106* 97 95 100*  BUN 114*  114* 116* 103* 100* 91*  CREATININE 8.83*  8.76* 8.72* 8.20* 7.99* 7.35*  CALCIUM 6.0*  6.0* 5.4* 5.9* 6.7* 6.4*  PHOS 4.5 5.0* 4.1  --   --    Liver Function Tests: Recent Labs  Lab 11/26/18 1550 11/28/18 0704 11/30/18 0508  AST 36  --   --   ALT 37  --   --   ALKPHOS 82  --   --   BILITOT 0.6  --   --   PROT 7.3  --   --   ALBUMIN 2.8* 2.4* 2.1*   Recent Labs  Lab 11/26/18 1550  LIPASE 21   No results for input(s): AMMONIA in the last 168 hours. CBC: Recent Labs  Lab 11/26/18 1550 11/27/18 0415 11/29/18 0515  WBC 14.8* 12.9* 10.7*  NEUTROABS 12.9*  --   --  HGB 9.1* 8.0* 8.0*  HCT 29.7* 24.3* 23.8*  MCV 85.6 80.5 78.5*  PLT 518* 471* 383   Cardiac Enzymes: Recent Labs  Lab 11/26/18 1550  TROPONINI <0.03   CBG: Recent Labs  Lab 11/30/18 0719 11/30/18 1200 11/30/18 1630 11/30/18 2113 11/30/18 2206  GLUCAP 109* 210* 98 66* 142*    Iron Studies: No results for input(s): IRON, TIBC, TRANSFERRIN, FERRITIN in the last 72 hours. Studies/Results: No results found.  Medications: Infusions: . sodium chloride 100 mL/hr at 12/01/18 1021    Scheduled Medications: . aspirin  81 mg Oral q morning - 10a  . atorvastatin  80 mg Oral QHS  . calcitRIOL  0.5 mcg Oral Daily  . calcium carbonate  1,000 mg of elemental calcium Oral TID WC  . chlorhexidine   15 mL Mouth Rinse BID  . cilostazol  50 mg Oral BID  . feeding supplement (ENSURE ENLIVE)  237 mL Oral QID  . fentaNYL  1 patch Transdermal Q72H  . fluticasone  1 spray Each Nare QPM  . heparin injection (subcutaneous)  5,000 Units Subcutaneous Q8H  . insulin aspart  0-9 Units Subcutaneous TID WC  . magic mouthwash  5 mL Oral TID AC & HS  . magnesium oxide  400 mg Oral BID  . mouth rinse  15 mL Mouth Rinse q12n4p  . metoprolol succinate  50 mg Oral Daily  . mirtazapine  7.5 mg Oral QHS  . nicotine  14 mg Transdermal Daily  . pantoprazole  40 mg Oral BID  . potassium chloride  40 mEq Oral BID    have reviewed scheduled and prn medications.  Physical Exam: General: Thin, cachectic male sitting on chair comfortable  Heart:RRR, s1s2 nl, no rubs Lungs:clear b/l, no wheeze or crackle Abdomen:soft, Non-tender, non-distended Extremities:no edema. Neurology: Alert, awake, following commands  Jenalee Trevizo Tanna Furry 12/01/2018,10:57 AM  LOS: 5 days

## 2018-12-01 NOTE — Progress Notes (Signed)
PROGRESS NOTE    Fred Hernandez  FKC:[id number] DOB: 11-23-1955 DOA: 11/26/2018 PCP: Vidal Schwalbe, MD   Brief Narrative:  63 year old male with PMH of hypertension, PVD s/p stents, CAD s/p stent, DN type 2, HLD, COPD with stage IIb/IIIa SCC of RUL with local extension and prostate CA. Patient came to the ED due to inability to urinate. His oral intake has been poor and progressively decreasing. Family at bedside reported he was vomiting at home and spitting while in the hospital. Denies nausea, fevers, chest pain, SOB, abdominal discomfort, melena, hematochezia.  Patient was admitted with AKI secondary likely to ATN with Zometa use and persistent hypocalcemia as well as other electrolyte derangements. Appreciate nephrology assistance.  Assessment & Plan:   Principal Problem:   Acute renal failure (ARF) (HCC) Active Problems:   Essential hypertension   Diabetes mellitus with circulatory complication (HCC)   Tobacco abuse   Squamous cell carcinoma of right lung (HCC)   Anemia associated with chemotherapy   Palliative care by specialist   DNR (do not resuscitate) discussion  Acute renal failure-persistent and likely secondary to ATN -Creatinineyesterday was8.2 and today is 7.35 -Likelydue to ATN with Zometa use along with dehydration and NSAIDs -renal US also demonstrated distended bladder, but no hydronephrosis -Continue followingup renal function panel -continue IVFwith normal saline -Continue monitor strict I's and O's with urine output of 2850 in the last 24 hours. -Appreciate nephrology assistance and it appears to likely be related to Zometa  Lung CA/with chronic pain -NSCLC diagnosed in 2/19 -Continuefentanyl patch (50 mcg) as well as Dilaudid 2 mg PO q2h. -Continue [facility] mouthwash forthrush -will hold chemotherapy while inpatient -tobacco cessation encouraged -Appreciate palliative care discussions for goals of care  Cachexia-severe protein calorie  malnutrition -We will start trial of Remeron 1/31  HTN -Continue Toprol XL. -BPcontinues to remain controlled and will resume home medications once there is an increase.  DM -Continue following glucose trend -ContinueholdingGlucophage while inpatient -will use SSI  Anemia -stable -will check repeat CBC as needed if there is any overt bleeding  Tobacco dependence -I have discussed tobacco cessation with the patient. I have counseled the patient regarding the negative impacts of continued tobacco use including but not limited to lung cancer, COPD, and cardiovascular disease. I have discussed alternatives to tobacco and modalities that may help facilitate tobacco cessation including but not limited to biofeedback, hypnosis, and medications. Total time spent with tobacco counseling was4 minutes. -Continue the use of nicotine patch  Hypocalcemia-persistent -Continue replacement with calcium gluconate IV ordered by Nephrology -Noted to have elevated parathyroid hormone levels and calcitriol started along with calcium carbonate -Continue to monitor and appreciate nephrology assistance  Mild hypokalemia-resolved -Magnesium levels have normalized and will replete orally and recheck labs in a.m.   DVT prophylaxis:heparin Code Status:FULL Family Communication:Fianc at bedside Disposition Plan:home once clinically improved  Consultants:  Nephrology   Palliative Care  Procedures:  None  Antimicrobials:    Anti-infectives(From admission, onward)   None    Subjective: Patient seen and evaluated today with no new acute complaints or concerns. No acute concerns or events noted overnight.  Patient denies any active symptomatology and feels that he might be able to eat a little bit more today.  Objective: Vitals:   11/30/18 0451 11/30/18 1315 11/30/18 2121 12/01/18 0517  BP: (!) 122/92 107/76 122/87 123/90  Pulse: (!) 109 (!) 110 95 (!) 126    Resp: 17 17 20 20   Temp: 97.6 F (36.4 C) (!)  97.5 F (36.4 C) 98.4 F (36.9 C) (!) 97.5 F (36.4 C)  TempSrc: Oral Oral Oral Oral  SpO2: 96% 92% 100% 98%  Weight:      Height:        Intake/Output Summary (Last 24 hours) at 12/01/2018 1141 Last data filed at 12/01/2018 1020 Gross per 24 hour  Intake 840 ml  Output 3750 ml  Net -2910 ml   Filed Weights   11/26/18 1337 11/26/18 2057  Weight: 47 kg 49.3 kg    Examination:  General exam: Appears calm and comfortable  Respiratory system: Clear to auscultation. Respiratory effort normal. Cardiovascular system: S1 & S2 heard, RRR. No JVD, murmurs, rubs, gallops or clicks. No pedal edema. Gastrointestinal system: Abdomen is nondistended, soft and nontender. No organomegaly or masses felt. Normal bowel sounds heard. Central nervous system: Alert and oriented. No focal neurological deficits. Extremities: Symmetric 5 x 5 power. Skin: No rashes, lesions or ulcers Psychiatry: Judgement and insight appear normal. Mood & affect appropriate.     Data Reviewed: I have personally reviewed following labs and imaging studies  CBC: Recent Labs  Lab 11/26/18 1550 11/27/18 0415 11/29/18 0515  WBC 14.8* 12.9* 10.7*  NEUTROABS 12.9*  --   --   HGB 9.1* 8.0* 8.0*  HCT 29.7* 24.3* 23.8*  MCV 85.6 80.5 78.5*  PLT 518* 471* 338   Basic Metabolic Panel: Recent Labs  Lab 11/28/18 0704 11/29/18 0515 11/30/18 0508 11/30/18 1714 12/01/18 0534  NA 129*  128* 130* 130* 131* 131*  K 3.6  3.7 3.2* 3.2* 3.5 3.8  CL 73*  73* 78* 85* 88* 93*  CO2 29  29 32 26 24 23   GLUCOSE 141*  140* 106* 97 95 100*  BUN 114*  114* 116* 103* 100* 91*  CREATININE 8.83*  8.76* 8.72* 8.20* 7.99* 7.35*  CALCIUM 6.0*  6.0* 5.4* 5.9* 6.7* 6.4*  MG  --  1.3* 1.9  --  1.8  PHOS 4.5 5.0* 4.1  --   --    GFR: Estimated Creatinine Clearance: 7.3 mL/min (A) (by C-G formula based on SCr of 7.35 mg/dL (H)). Liver Function Tests: Recent Labs  Lab  11/26/18 1550 11/28/18 0704 11/30/18 0508  AST 36  --   --   ALT 37  --   --   ALKPHOS 82  --   --   BILITOT 0.6  --   --   PROT 7.3  --   --   ALBUMIN 2.8* 2.4* 2.1*   Recent Labs  Lab 11/26/18 1550  LIPASE 21   No results for input(s): AMMONIA in the last 168 hours. Coagulation Profile: No results for input(s): INR, PROTIME in the last 168 hours. Cardiac Enzymes: Recent Labs  Lab 11/26/18 1550  TROPONINI <0.03   BNP (last 3 results) No results for input(s): PROBNP in the last 8760 hours. HbA1C: No results for input(s): HGBA1C in the last 72 hours. CBG: Recent Labs  Lab 11/30/18 0719 11/30/18 1200 11/30/18 1630 11/30/18 2113 11/30/18 2206  GLUCAP 109* 210* 98 66* 142*   Lipid Profile: No results for input(s): CHOL, HDL, LDLCALC, TRIG, CHOLHDL, LDLDIRECT in the last 72 hours. Thyroid Function Tests: No results for input(s): TSH, T4TOTAL, FREET4, T3FREE, THYROIDAB in the last 72 hours. Anemia Panel: No results for input(s): VITAMINB12, FOLATE, FERRITIN, TIBC, IRON, RETICCTPCT in the last 72 hours. Sepsis Labs: No results for input(s): PROCALCITON, LATICACIDVEN in the last 168 hours.  Recent Results (from the past 240 hour(s))  Urine culture     Status: None   Collection Time: 11/26/18  2:09 PM  Result Value Ref Range Status   Specimen Description   Final    URINE, CATHETERIZED Performed at [facility], [address] [place], [place] [address]    Special Requests   Final    Normal Performed at [facility], [address], [place], [place] [address]    Culture   Final    NO GROWTH Performed at [facility], [place] [address], [place], [place] [address]    Report Status 11/29/2018 FINAL  Final         Radiology Studies: No results found.      Scheduled Meds: . aspirin  81 mg Oral q morning - 10a  . atorvastatin  80 mg Oral QHS  . calcitRIOL  0.5 mcg Oral Daily  . calcium carbonate  1,000 mg of elemental calcium Oral TID WC  .  chlorhexidine  15 mL Mouth Rinse BID  . cilostazol  50 mg Oral BID  . feeding supplement (ENSURE ENLIVE)  237 mL Oral QID  . fentaNYL  1 patch Transdermal Q72H  . fluticasone  1 spray Each Nare QPM  . heparin injection (subcutaneous)  5,000 Units Subcutaneous Q8H  . insulin aspart  0-9 Units Subcutaneous TID WC  . magic mouthwash  5 mL Oral TID AC & HS  . magnesium oxide  400 mg Oral BID  . mouth rinse  15 mL Mouth Rinse q12n4p  . metoprolol succinate  50 mg Oral Daily  . mirtazapine  7.5 mg Oral QHS  . nicotine  14 mg Transdermal Daily  . pantoprazole  40 mg Oral BID  . potassium chloride  40 mEq Oral BID   Continuous Infusions: . sodium chloride 100 mL/hr at 12/01/18 1021  . calcium gluconate       LOS: 5 days    Time spent: 30 minutes    Brytney Somes Darleen Crocker, DO [facility] Pager [phone]  If 7PM-7AM, please contact night-coverage [email or web address] Password [facility] 12/01/2018, 11:41 AM

## 2018-12-01 NOTE — Progress Notes (Signed)
Pt having some notable altered mental status during the night, attempted several times to get out of the bed, stated that he had to empty all the bags, and he was ready to go home. RN CM was able to redirect patient each brief episode.

## 2018-12-02 LAB — RENAL FUNCTION PANEL
Albumin: 2.2 g/dL — ABNORMAL LOW (ref 3.5–5.0)
Anion gap: 16 — ABNORMAL HIGH (ref 5–15)
BUN: 73 mg/dL — ABNORMAL HIGH (ref 8–23)
CALCIUM: 6.1 mg/dL — AB (ref 8.9–10.3)
CO2: 20 mmol/L — ABNORMAL LOW (ref 22–32)
Chloride: 100 mmol/L (ref 98–111)
Creatinine, Ser: 5.91 mg/dL — ABNORMAL HIGH (ref 0.61–1.24)
GFR calc non Af Amer: 9 mL/min — ABNORMAL LOW (ref >60)
GFR, EST AFRICAN AMERICAN: 11 mL/min — AB (ref >60)
Glucose, Bld: 98 mg/dL (ref 70–99)
Phosphorus: 2.9 mg/dL (ref 2.5–4.6)
Potassium: 3 mmol/L — ABNORMAL LOW (ref 3.5–5.1)
Sodium: 136 mmol/L (ref 135–145)

## 2018-12-02 LAB — BASIC METABOLIC PANEL
Anion gap: 14 (ref 5–15)
BUN: 71 mg/dL — ABNORMAL HIGH (ref 8–23)
CO2: 19 mmol/L — AB (ref 22–32)
CREATININE: 5.47 mg/dL — AB (ref 0.61–1.24)
Calcium: 6.4 mg/dL — CL (ref 8.9–10.3)
Chloride: 104 mmol/L (ref 98–111)
GFR calc Af Amer: 12 mL/min — ABNORMAL LOW (ref >60)
GFR calc non Af Amer: 10 mL/min — ABNORMAL LOW (ref >60)
Glucose, Bld: 124 mg/dL — ABNORMAL HIGH (ref 70–99)
Potassium: 3.1 mmol/L — ABNORMAL LOW (ref 3.5–5.1)
Sodium: 137 mmol/L (ref 135–145)

## 2018-12-02 LAB — GLUCOSE, CAPILLARY
GLUCOSE-CAPILLARY: 89 mg/dL (ref 70–99)
Glucose-Capillary: 94 mg/dL (ref 70–99)
Glucose-Capillary: 124 mg/dL — ABNORMAL HIGH (ref 70–99)
Glucose-Capillary: 152 mg/dL — ABNORMAL HIGH (ref 70–99)

## 2018-12-02 MED ORDER — POTASSIUM CHLORIDE CRYS ER 20 MEQ PO TBCR
40.0000 meq | EXTENDED_RELEASE_TABLET | Freq: Two times a day (BID) | ORAL | Status: DC
  Administered 2018-12-02: 40 meq via ORAL
  Filled 2018-12-02 (×4): qty 2

## 2018-12-02 MED ORDER — HYDRALAZINE HCL 20 MG/ML IJ SOLN: 10.0000 mg | INTRAMUSCULAR | Status: DC | PRN

## 2018-12-02 MED ORDER — CALCIUM GLUCONATE-NACL 1-0.675 GM/50ML-% IV SOLN
1.0000 g | Freq: Once | INTRAVENOUS | Status: AC
  Administered 2018-12-02: 1000 mg via INTRAVENOUS
  Filled 2018-12-02: qty 50

## 2018-12-02 NOTE — Progress Notes (Signed)
PROGRESS NOTE    Fred Hernandez  DDU:[id number] DOB: [birth year] DOA: 11/26/2018 PCP: Vidal Schwalbe, MD   Brief Narrative:  63 year old male with PMH of hypertension, PVD s/p stents, CAD s/p stent, DN type 2, HLD, COPD with stage IIb/IIIa SCC of RUL with local extension and prostate CA. Patient came to the ED due to inability to urinate. His oral intake has been poor and progressively decreasing. Family at bedside reported he was vomiting at home and spitting while in the hospital. Denies nausea, fevers, chest pain, SOB, abdominal discomfort, melena, hematochezia.  Patient was admitted with AKI secondary likely to ATN with Zometa use and persistent hypocalcemia as well as other electrolyte derangements. Appreciate nephrology assistance.   Assessment & Plan:   Principal Problem:   Acute renal failure (ARF) (HCC) Active Problems:   Essential hypertension   Diabetes mellitus with circulatory complication (HCC)   Tobacco abuse   Squamous cell carcinoma of right lung (HCC)   Anemia associated with chemotherapy   Palliative care by specialist   DNR (do not resuscitate) discussion  Acute renal failure-persistent and likely secondary to ATN -Creatinineyesterday was7.35 and is 5.91 today -Likelydue to ATN with Zometa use along with dehydration and NSAIDs -renal US also demonstrated distended bladder, but no hydronephrosis -Continue followingup renal function panel -continue IVFwith normal saline -Continue monitor strict I's and O's with urine output of 2850 in the last 24 hours. -Appreciate nephrology assistance and it appears to likely be related to Zometa  Lung CA/with chronic pain -NSCLC diagnosed in 2/19 -Continuefentanyl patch (50 mcg) as well as Dilaudid 2 mg PO q2h. -Continue [facility] mouthwash forthrush -will hold chemotherapy while inpatient -tobacco cessation encouraged -Appreciate palliative care discussions for goals of care  Cachexia-severe protein calorie  malnutrition -We will start trial of Remeron 1/31  HTN -Continue Toprol XL; noted to have tachycardia overnight which required IV metoprolol -Restart some of the home antihypertensive agents for better control.  DM -Continue following glucose trend -ContinueholdingGlucophage while inpatient -will use SSI  Anemia -stable -willcheck repeat CBC as needed if there is any overt bleeding  Tobacco dependence -I have discussed tobacco cessation with the patient. I have counseled the patient regarding the negative impacts of continued tobacco use including but not limited to lung cancer, COPD, and cardiovascular disease. I have discussed alternatives to tobacco and modalities that may help facilitate tobacco cessation including but not limited to biofeedback, hypnosis, and medications. Total time spent with tobacco counseling was4 minutes. -Continue the use of nicotine patch  Hypocalcemia-persistent -Continue replacement with calcium gluconate IV today with a corrected calcium of 7.5 -Noted to have elevated parathyroid hormone levels and calcitriol started along with calcium carbonate -Continue to monitor and appreciate nephrology assistance  Mild hypokalemia -Magnesium levels have normalized and will replete orally and recheck labs in a.m. -Replete orally and recheck in a.m.   DVT prophylaxis:heparin Code Status:FULL Family Communication:Fianc at bedside Disposition Plan:home once clinically improved  Consultants:  Nephrology   Palliative Care  Procedures:  None  Antimicrobials:    Anti-infectives(From admission, onward)   None    Subjective: Patient seen and evaluated today with no new acute complaints or concerns. No acute concerns or events noted overnight.  He appears to be feeling somewhat better this morning and has more of an appetite returning.  Objective: Vitals:   12/01/18 0517 12/01/18 2200 12/02/18 0119 12/02/18 0600  BP:  123/90 (!) 135/100 125/84 (!) 138/95  Pulse: (!) 126 (!) 144 (!) 121 [name])  118  Resp: 20 (!) 22  (!) 24  Temp: (!) 97.5 F (36.4 C) 98 F (36.7 C)  99 F (37.2 C)  TempSrc: Oral   Oral  SpO2: 98% 100%  98%  Weight:      Height:        Intake/Output Summary (Last 24 hours) at 12/02/2018 1049 Last data filed at 12/02/2018 0900 Gross per 24 hour  Intake 2546.37 ml  Output 3300 ml  Net -753.63 ml   Filed Weights   11/26/18 1337 11/26/18 2057  Weight: 47 kg 49.3 kg    Examination:  General exam: Appears calm and comfortable  Respiratory system: Clear to auscultation. Respiratory effort normal. Cardiovascular system: S1 & S2 heard, RRR. No JVD, murmurs, rubs, gallops or clicks. No pedal edema. Gastrointestinal system: Abdomen is nondistended, soft and nontender. No organomegaly or masses felt. Normal bowel sounds heard. Central nervous system: Alert and oriented. No focal neurological deficits. Extremities: Symmetric 5 x 5 power. Skin: No rashes, lesions or ulcers Psychiatry: Judgement and insight appear normal. Mood & affect appropriate.     Data Reviewed: I have personally reviewed following labs and imaging studies  CBC: Recent Labs  Lab 11/26/18 1550 11/27/18 0415 11/29/18 0515  WBC 14.8* 12.9* 10.7*  NEUTROABS 12.9*  --   --   HGB 9.1* 8.0* 8.0*  HCT 29.7* 24.3* 23.8*  MCV 85.6 80.5 78.5*  PLT 518* 471* 151   Basic Metabolic Panel: Recent Labs  Lab 11/28/18 0704 11/29/18 0515 11/30/18 0508 11/30/18 1714 12/01/18 0534 12/01/18 1742 12/02/18 0820  NA 129*  128* 130* 130* 131* 131* 133* 136  K 3.6  3.7 3.2* 3.2* 3.5 3.8 3.7 3.0*  CL 73*  73* 78* 85* 88* 93* 95* 100  CO2 29  29 32 26 24 23  21* 20*  GLUCOSE 141*  140* 106* 97 95 100* 155* 98  BUN 114*  114* 116* 103* 100* 91* 86* 73*  CREATININE 8.83*  8.76* 8.72* 8.20* 7.99* 7.35* 7.09* 5.91*  CALCIUM 6.0*  6.0* 5.4* 5.9* 6.7* 6.4* 6.5* 6.1*  MG  --  1.3* 1.9  --  1.8  --   --   PHOS 4.5 5.0* 4.1   --   --   --  2.9   GFR: Estimated Creatinine Clearance: 9 mL/min (A) (by C-G formula based on SCr of 5.91 mg/dL (H)). Liver Function Tests: Recent Labs  Lab 11/26/18 1550 11/28/18 0704 11/30/18 0508 12/02/18 0820  AST 36  --   --   --   ALT 37  --   --   --   ALKPHOS 82  --   --   --   BILITOT 0.6  --   --   --   PROT 7.3  --   --   --   ALBUMIN 2.8* 2.4* 2.1* 2.2*   Recent Labs  Lab 11/26/18 1550  LIPASE 21   No results for input(s): AMMONIA in the last 168 hours. Coagulation Profile: No results for input(s): INR, PROTIME in the last 168 hours. Cardiac Enzymes: Recent Labs  Lab 11/26/18 1550  TROPONINI <0.03   BNP (last 3 results) No results for input(s): PROBNP in the last 8760 hours. HbA1C: No results for input(s): HGBA1C in the last 72 hours. CBG: Recent Labs  Lab 11/30/18 2113 11/30/18 2206 12/01/18 1236 12/01/18 1621 12/02/18 0740  GLUCAP 66* 142* 137* 119* 94   Lipid Profile: No results for input(s): CHOL, HDL, LDLCALC, TRIG, CHOLHDL, LDLDIRECT in  the last 72 hours. Thyroid Function Tests: No results for input(s): TSH, T4TOTAL, FREET4, T3FREE, THYROIDAB in the last 72 hours. Anemia Panel: No results for input(s): VITAMINB12, FOLATE, FERRITIN, TIBC, IRON, RETICCTPCT in the last 72 hours. Sepsis Labs: No results for input(s): PROCALCITON, LATICACIDVEN in the last 168 hours.  Recent Results (from the past 240 hour(s))  Urine culture     Status: None   Collection Time: 11/26/18  2:09 PM  Result Value Ref Range Status   Specimen Description   Final    URINE, CATHETERIZED Performed at [facility], [address], [place], [place] [address]    Special Requests   Final    Normal Performed at [facility], [address], [place], [place] [address]    Culture   Final    NO GROWTH Performed at [facility], [place] [address] [place], [place] [address]    Report Status 11/29/2018 FINAL  Final         Radiology Studies: No results  found.      Scheduled Meds: . aspirin  81 mg Oral q morning - 10a  . atorvastatin  80 mg Oral QHS  . calcitRIOL  0.5 mcg Oral Daily  . calcium carbonate  1,000 mg of elemental calcium Oral TID WC  . chlorhexidine  15 mL Mouth Rinse BID  . cilostazol  50 mg Oral BID  . feeding supplement (ENSURE ENLIVE)  237 mL Oral QID  . fentaNYL  1 patch Transdermal Q72H  . fluticasone  1 spray Each Nare QPM  . heparin injection (subcutaneous)  5,000 Units Subcutaneous Q8H  . insulin aspart  0-9 Units Subcutaneous TID WC  . magic mouthwash  5 mL Oral TID AC & HS  . magnesium oxide  400 mg Oral BID  . mouth rinse  15 mL Mouth Rinse q12n4p  . metoprolol succinate  50 mg Oral Daily  . mirtazapine  7.5 mg Oral QHS  . nicotine  14 mg Transdermal Daily  . pantoprazole  40 mg Oral BID  . potassium chloride  40 mEq Oral BID   Continuous Infusions: . sodium chloride 100 mL/hr at 12/01/18 2222  . calcium gluconate       LOS: 6 days    Time spent: 30 minutes    Nana Vastine Darleen Crocker, DO [facility] Pager [phone]  If 7PM-7AM, please contact night-coverage [email or web address] Password [facility] 12/02/2018, 10:49 AM

## 2018-12-02 NOTE — Progress Notes (Signed)
RN paged C. Bodenheimer, NP to make him aware that patient has refused all medications this evening.  P.J. Linus Mako, RN

## 2018-12-02 NOTE — Progress Notes (Signed)
Patient was tachycardic earlier in shift.  Received dose for metoprolol IV x 1.  Heart rate and blood pressure decreased.  Patient tolerated well.

## 2018-12-03 ENCOUNTER — Ambulatory Visit (INDEPENDENT_AMBULATORY_CARE_PROVIDER_SITE_OTHER): Payer: Medicare Other | Admitting: Otolaryngology

## 2018-12-03 DIAGNOSIS — E440 Moderate protein-calorie malnutrition: Secondary | ICD-10-CM

## 2018-12-03 LAB — GLUCOSE, CAPILLARY
GLUCOSE-CAPILLARY: 134 mg/dL — AB (ref 70–99)
Glucose-Capillary: 97 mg/dL (ref 70–99)
Glucose-Capillary: 140 mg/dL — ABNORMAL HIGH (ref 70–99)
Glucose-Capillary: 94 mg/dL (ref 70–99)
Glucose-Capillary: 140 mg/dL — ABNORMAL HIGH (ref 70–99)

## 2018-12-03 LAB — MAGNESIUM: Magnesium: 1.4 mg/dL — ABNORMAL LOW (ref 1.7–2.4)

## 2018-12-03 LAB — RENAL FUNCTION PANEL
Albumin: 2.2 g/dL — ABNORMAL LOW (ref 3.5–5.0)
Anion gap: 12 (ref 5–15)
BUN: 61 mg/dL — ABNORMAL HIGH (ref 8–23)
CALCIUM: 6.5 mg/dL — AB (ref 8.9–10.3)
CHLORIDE: 108 mmol/L (ref 98–111)
CO2: 18 mmol/L — AB (ref 22–32)
Creatinine, Ser: 5.03 mg/dL — ABNORMAL HIGH (ref 0.61–1.24)
GFR calc Af Amer: 13 mL/min — ABNORMAL LOW (ref >60)
GFR calc non Af Amer: 11 mL/min — ABNORMAL LOW (ref >60)
Glucose, Bld: 150 mg/dL — ABNORMAL HIGH (ref 70–99)
Phosphorus: 3.4 mg/dL (ref 2.5–4.6)
Potassium: 2.5 mmol/L — CL (ref 3.5–5.1)
SODIUM: 138 mmol/L (ref 135–145)

## 2018-12-03 LAB — BASIC METABOLIC PANEL
Anion gap: 11 (ref 5–15)
BUN: 52 mg/dL — ABNORMAL HIGH (ref 8–23)
CALCIUM: 7 mg/dL — AB (ref 8.9–10.3)
CO2: 18 mmol/L — ABNORMAL LOW (ref 22–32)
CREATININE: 4.33 mg/dL — AB (ref 0.61–1.24)
Chloride: 108 mmol/L (ref 98–111)
GFR calc Af Amer: 16 mL/min — ABNORMAL LOW (ref >60)
GFR calc non Af Amer: 14 mL/min — ABNORMAL LOW (ref >60)
Glucose, Bld: 114 mg/dL — ABNORMAL HIGH (ref 70–99)
Potassium: 3 mmol/L — ABNORMAL LOW (ref 3.5–5.1)
Sodium: 137 mmol/L (ref 135–145)

## 2018-12-03 MED ORDER — POTASSIUM CHLORIDE IN NACL 40-0.9 MEQ/L-% IV SOLN
INTRAVENOUS | Status: DC
  Administered 2018-12-03: 100 mL/h via INTRAVENOUS

## 2018-12-03 MED ORDER — MAGNESIUM SULFATE 4 GM/100ML IV SOLN
4.0000 g | Freq: Once | INTRAVENOUS | Status: AC
  Administered 2018-12-03: 4 g via INTRAVENOUS
  Filled 2018-12-03: qty 100

## 2018-12-03 MED ORDER — POTASSIUM CHLORIDE CRYS ER 20 MEQ PO TBCR
40.0000 meq | EXTENDED_RELEASE_TABLET | Freq: Three times a day (TID) | ORAL | Status: DC
  Administered 2018-12-03: 40 meq via ORAL

## 2018-12-03 MED ORDER — PANTOPRAZOLE SODIUM 40 MG PO TBEC
40.0000 mg | DELAYED_RELEASE_TABLET | Freq: Every day | ORAL | Status: DC
  Administered 2018-12-03 – 2018-12-06 (×4): 40 mg via ORAL
  Filled 2018-12-03 (×3): qty 1

## 2018-12-03 MED ORDER — CALCIUM GLUCONATE-NACL 1-0.675 GM/50ML-% IV SOLN
1.0000 g | Freq: Once | INTRAVENOUS | Status: AC
  Administered 2018-12-03: 1000 mg via INTRAVENOUS
  Filled 2018-12-03: qty 50

## 2018-12-03 MED ORDER — CALCIUM CARBONATE 1250 (500 CA) MG PO TABS
1000.0000 mg | ORAL_TABLET | Freq: Every day | ORAL | Status: DC
  Administered 2018-12-03 – 2018-12-05 (×3): 1000 mg via ORAL
  Filled 2018-12-03 (×3): qty 1

## 2018-12-03 MED ORDER — POTASSIUM CHLORIDE 10 MEQ/100ML IV SOLN
10.0000 meq | INTRAVENOUS | Status: AC
  Administered 2018-12-03 (×5): 10 meq via INTRAVENOUS
  Filled 2018-12-03 (×5): qty 100

## 2018-12-03 MED ORDER — POTASSIUM CHLORIDE CRYS ER 20 MEQ PO TBCR
40.0000 meq | EXTENDED_RELEASE_TABLET | Freq: Once | ORAL | Status: AC
  Administered 2018-12-03: 40 meq via ORAL
  Filled 2018-12-03: qty 2

## 2018-12-03 MED ORDER — MAGNESIUM OXIDE 400 (241.3 MG) MG PO TABS
800.0000 mg | ORAL_TABLET | Freq: Two times a day (BID) | ORAL | Status: DC
  Administered 2018-12-03 – 2018-12-06 (×7): 800 mg via ORAL
  Filled 2018-12-03 (×6): qty 2

## 2018-12-03 NOTE — Clinical Social Work Placement (Addendum)
   CLINICAL SOCIAL WORK PLACEMENT  NOTE  Date:  12/03/2018  Patient Details  Name: Fred Hernandez MRN: [id number] Date of Birth: Oct 01, 1956  Clinical Social Work is seeking post-discharge placement for this patient at the [place] level of care (*CSW will initial, date and re-position this form in  chart as items are completed):  Yes   Patient/family provided with [facility] Department's list of facilities offering this level of care within the geographic area requested by the patient (or if unable, by the patient's family).  Yes   Patient/family informed of their freedom to choose among providers that offer the needed level of care, that participate in Medicare, Medicaid or managed care program needed by the patient, have an available bed and are willing to accept the patient.  No   Patient/family informed of [facility] [place] ownership interest in [facility] and [facility], as well as of the fact that they are under no obligation to receive care at these facilities.  PASRR submitted to EDS on       PASRR number received on       Existing PASRR number confirmed on       FL2 transmitted to all facilities in geographic area requested by pt/family on 12/03/18     FL2 transmitted to all facilities within larger geographic area on       Patient informed that his/her managed care company has contracts with or will negotiate with certain facilities, including the following:            Patient/family informed of bed offers received.  Patient chooses bed at       Physician recommends and patient chooses bed at      Patient to be transferred to   on  .  Patient to be transferred to facility by       Patient family notified on   of transfer.  Name of family member notified:        PHYSICIAN       Additional Comment:  Patient and supports, fiance [name], [place],, and sister [name] have indicated that they are interested in rehab, and  that they would prefer facilities in [place] as pt and fiance live in [place] and sister lives in [place].  Info sent to all three facilities. _______________________________________________ Trish Mage, LCSW 12/03/2018, 3:46 PM

## 2018-12-03 NOTE — Progress Notes (Signed)
Patient ID: Fred Hernandez, male   DOB: September 25, 1956, 63 y.o.   MRN: [id number] [facility] Progress Note   Assessment/ Plan:   1.  Acute kidney injury:  Likely ATN from zoledronic acid in the setting of volume depletion and NSAID use.  Now with improving urine output/polyuria on intravenous fluids-we will discontinue intravenous fluids and limit sodium loading.  Renal function continues to show improvement consistent with recovery phase of ATN. 2.  Metabolic acidosis:  Mild with anion gap-discontinue saline. 3.  Non-small cell lung cancer: Recently on palliative chemotherapy with carboplatin and gemcitabine. 4.  Failure to thrive/cachexia 5.  Anemia: Associated with recent malignancy/ongoing chemotherapy 6.  Hypocalcemia: Corrected calcium today is ~8.0.    Likely from effect of zoledronic acid and now sustained by polyuria-increase oral supplementation and will give intravenous calcium gluconate today. 7.  Hypomagnesemia:  Possibly carboplatinum induced with tubular toxicity, continue oral magnesium supplement/intravenous magnesium today. 8.  Hypokalemia: Secondary to polyuria-supplement via oral route.  Subjective:   Reports to be feeling better-inquires about going home   Objective:   BP (!) 137/98 (BP Location: Right Arm)   Pulse (!) 124   Temp 98.6 F (37 C) (Oral)   Resp 20   Ht 5\' 9"  (1.753 m)   Wt 49.3 kg   SpO2 98%   BMI 16.05 kg/m   Intake/Output Summary (Last 24 hours) at 12/03/2018 0825 Last data filed at 12/03/2018 0548 Gross per 24 hour  Intake 3199.02 ml  Output 4050 ml  Net -850.98 ml   Weight change:   Physical Exam: Gen: Comfortably resting in bed CVS: Pulse regular rhythm, normal rate, S1 and S2 normal Resp: Clear to auscultation bilaterally, no distinct rales or rhonchi Abd: Soft, flat, nontender Ext: No lower extremity edema  Imaging: No results found.  Labs: BMET Recent Labs  Lab 11/28/18 0704 11/29/18 0515 11/30/18 0508 11/30/18 1714  12/01/18 0534 12/01/18 1742 12/02/18 0820 12/02/18 1702 12/03/18 0540  NA 129*  128* 130* 130* 131* 131* 133* 136 137 138  K 3.6  3.7 3.2* 3.2* 3.5 3.8 3.7 3.0* 3.1* 2.5*  CL 73*  73* 78* 85* 88* 93* 95* 100 104 108  CO2 29  29 32 26 24 23  21* 20* 19* 18*  GLUCOSE 141*  140* 106* 97 95 100* 155* 98 124* 150*  BUN 114*  114* 116* 103* 100* 91* 86* 73* 71* 61*  CREATININE 8.83*  8.76* 8.72* 8.20* 7.99* 7.35* 7.09* 5.91* 5.47* 5.03*  CALCIUM 6.0*  6.0* 5.4* 5.9* 6.7* 6.4* 6.5* 6.1* 6.4* 6.5*  PHOS 4.5 5.0* 4.1  --   --   --  2.9  --  3.4   CBC Recent Labs  Lab 11/26/18 1550 11/27/18 0415 11/29/18 0515  WBC 14.8* 12.9* 10.7*  NEUTROABS 12.9*  --   --   HGB 9.1* 8.0* 8.0*  HCT 29.7* 24.3* 23.8*  MCV 85.6 80.5 78.5*  PLT 518* 471* 383   Medications:    . aspirin  81 mg Oral q morning - 10a  . atorvastatin  80 mg Oral QHS  . calcitRIOL  0.5 mcg Oral Daily  . calcium carbonate  1,000 mg of elemental calcium Oral TID WC  . calcium carbonate  1,000 mg of elemental calcium Oral QHS  . chlorhexidine  15 mL Mouth Rinse BID  . cilostazol  50 mg Oral BID  . feeding supplement (ENSURE ENLIVE)  237 mL Oral QID  . fentaNYL  1 patch Transdermal Q72H  .  fluticasone  1 spray Each Nare QPM  . heparin injection (subcutaneous)  5,000 Units Subcutaneous Q8H  . insulin aspart  0-9 Units Subcutaneous TID WC  . magic mouthwash  5 mL Oral TID AC & HS  . magnesium oxide  400 mg Oral BID  . mouth rinse  15 mL Mouth Rinse q12n4p  . metoprolol succinate  50 mg Oral Daily  . mirtazapine  7.5 mg Oral QHS  . nicotine  14 mg Transdermal Daily  . pantoprazole  40 mg Oral BID  . potassium chloride  40 mEq Oral BID  . potassium chloride  40 mEq Oral Once   Elmarie Shiley, MD 12/03/2018, 8:25 AM

## 2018-12-03 NOTE — Progress Notes (Signed)
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 Patient came to the ED due to inability to urinate. His oral intake has been poor and progressively decreasing. Family at bedside reported he was vomiting at home and spitting while in the hospital. Denies nausea, fevers, chest pain, SOB, abdominal discomfort, melena, hematochezia.  Patient was admitted with AKI secondary likely to ATN with Zometa use and persistent hypocalcemia as well as other electrolyte derangements. Appreciate nephrology assistance.   Assessment & Plan:   Principal Problem:   Acute renal failure (ARF) (HCC) Active Problems:   Essential hypertension   Diabetes mellitus with circulatory complication (HCC)   Tobacco abuse   Squamous cell carcinoma of right lung (HCC)   Anemia associated with chemotherapy   Palliative care by specialist   DNR (do not resuscitate) discussion   Malnutrition of moderate degree  Acute renal failure-improving and likely secondary to ATN -Creatinineyesterday was5.91 and is 5.03 today -Likelydue to ATN with Zometa usealong with dehydration and NSAIDs -renal US also demonstrated distended bladder, but no hydronephrosis -Continue followingup renal function panel -DC IV fluid today -Continue monitor strict I's and O's with urine output of 4050 in the last 24 hours. -Appreciate nephrology assistance and it appears to likely be related to Zometa  Lung CA/with chronic pain -NSCLC diagnosed in 2/19 -Continuefentanyl patch (50 mcg) as well as Dilaudid 2 mg PO q2h. -Continue [facility] mouthwash forthrush -will hold chemotherapy while inpatient -tobacco cessation encouraged -Appreciate palliative care discussions for goals of care  Cachexia-severe  protein calorie malnutrition-ongoing -We will start trial of Remeron1/31 -Ask dietary to evaluate  HTN -Continue Toprol XL at increased dose due to tachycardia; noted to have tachycardia overnight which required IV metoprolol -Restart some of the home antihypertensive agents for better control.  DM -Continue following glucose trend -ContinueholdingGlucophage while inpatient -will use SSI  Anemia -stable -willcheck repeat CBC as needed if there is any overt bleeding  Tobacco dependence -I have discussed tobacco cessation with the patient. I have counseled the patient regarding the negative impacts of continued tobacco use including but not limited to lung cancer, COPD, and cardiovascular disease. I have discussed alternatives to tobacco and modalities that may help facilitate tobacco cessation including but not limited to biofeedback, hypnosis, and medications. Total time spent with tobacco counseling was4 minutes. -Continue the use of nicotine patch  Hypocalcemia-persistent -Continue replacement with calcium gluconateIV today with a corrected calcium of 8.0 -Noted to have elevated parathyroid hormone levelsand calcitriol started along with calcium carbonate -Continue to monitor and appreciate nephrology assistance  Hypokalemia -Magnesium levels have normalized and will replete orally and recheck labs in a.m. -Replete IV today as patient will not take PO. Recheck labs later today and monitor closely   DVT prophylaxis:heparin Code Status:FULL Family Communication:Fianc at bedside Disposition Plan:home once clinically improved  Consultants:  Nephrology   Palliative Care  Procedures:  None  Antimicrobials:    Anti-infectives(From admission, onward)   None     Subjective: Patient seen and evaluated today with no new acute complaints or concerns. No acute concerns or events noted overnight.  He continues to have poor appetite and  family members at bedside are concerned about him going home due to bedbugs and poor living situation  at home.  Objective: Vitals:   12/02/18 0600 12/02/18 1435 12/02/18 2155 12/03/18 0550  BP: (!) 138/95 (!) 133/91 (!) 132/93 (!) 137/98  Pulse: (!) 118 (!) 106 (!) 116 (!) 124  Resp: (!) 24 20    Temp: 99 F (37.2 C) 97.7 F (36.5 C) 98.9 F (37.2 C) 98.6 F (37 C)  TempSrc: Oral Oral Oral Oral  SpO2: 98% 100% 93% 98%  Weight:      Height:        Intake/Output Summary (Last 24 hours) at 12/03/2018 0952 Last data filed at 12/03/2018 0548 Gross per 24 hour  Intake 2959.02 ml  Output 4050 ml  Net -1090.98 ml   Filed Weights   11/26/18 1337 11/26/18 2057  Weight: 47 kg 49.3 kg    Examination:  General exam: Appears calm and comfortable  Respiratory system: Clear to auscultation. Respiratory effort normal. Cardiovascular system: S1 & S2 heard, RRR. No JVD, murmurs, rubs, gallops or clicks. No pedal edema. Gastrointestinal system: Abdomen is nondistended, soft and nontender. No organomegaly or masses felt. Normal bowel sounds heard. Central nervous system: Alert and oriented. No focal neurological deficits. Extremities: Symmetric 5 x 5 power. Skin: No rashes, lesions or ulcers Psychiatry: Judgement and insight appear normal. Mood & affect appropriate.     Data Reviewed: I have personally reviewed following labs and imaging studies  CBC: Recent Labs  Lab 11/26/18 1550 11/27/18 0415 11/29/18 0515  WBC 14.8* 12.9* 10.7*  NEUTROABS 12.9*  --   --   HGB 9.1* 8.0* 8.0*  HCT 29.7* 24.3* 23.8*  MCV 85.6 80.5 78.5*  PLT 518* 471* 623   Basic Metabolic Panel: Recent Labs  Lab 11/28/18 0704 11/29/18 0515 11/30/18 0508  12/01/18 0534 12/01/18 1742 12/02/18 0820 12/02/18 1702 12/03/18 0540  NA 129*  128* 130* 130*   < > 131* 133* 136 137 138  K 3.6  3.7 3.2* 3.2*   < > 3.8 3.7 3.0* 3.1* 2.5*  CL 73*  73* 78* 85*   < > 93* 95* 100 104 108  CO2 29  29 32 26   < >  23 21* 20* 19* 18*  GLUCOSE 141*  140* 106* 97   < > 100* 155* 98 124* 150*  BUN 114*  114* 116* 103*   < > 91* 86* 73* 71* 61*  CREATININE 8.83*  8.76* 8.72* 8.20*   < > 7.35* 7.09* 5.91* 5.47* 5.03*  CALCIUM 6.0*  6.0* 5.4* 5.9*   < > 6.4* 6.5* 6.1* 6.4* 6.5*  MG  --  1.3* 1.9  --  1.8  --   --   --  1.4*  PHOS 4.5 5.0* 4.1  --   --   --  2.9  --  3.4   < > = values in this interval not displayed.   GFR: Estimated Creatinine Clearance: 10.6 mL/min (A) (by C-G formula based on SCr of 5.03 mg/dL (H)). Liver Function Tests: Recent Labs  Lab 11/26/18 1550 11/28/18 0704 11/30/18 0508 12/02/18 0820 12/03/18 0540  AST 36  --   --   --   --   ALT 37  --   --   --   --   ALKPHOS 82  --   --   --   --   BILITOT 0.6  --   --   --   --   PROT 7.3  --   --   --   --   ALBUMIN 2.8*  2.4* 2.1* 2.2* 2.2*   Recent Labs  Lab 11/26/18 1550  LIPASE 21   No results for input(s): AMMONIA in the last 168 hours. Coagulation Profile: No results for input(s): INR, PROTIME in the last 168 hours. Cardiac Enzymes: Recent Labs  Lab 11/26/18 1550  TROPONINI <0.03   BNP (last 3 results) No results for input(s): PROBNP in the last 8760 hours. HbA1C: No results for input(s): HGBA1C in the last 72 hours. CBG: Recent Labs  Lab 12/02/18 0740 12/02/18 1117 12/02/18 1610 12/02/18 2156 12/03/18 0727  GLUCAP 94 89 124* 152* 134*   Lipid Profile: No results for input(s): CHOL, HDL, LDLCALC, TRIG, CHOLHDL, LDLDIRECT in the last 72 hours. Thyroid Function Tests: No results for input(s): TSH, T4TOTAL, FREET4, T3FREE, THYROIDAB in the last 72 hours. Anemia Panel: No results for input(s): VITAMINB12, FOLATE, FERRITIN, TIBC, IRON, RETICCTPCT in the last 72 hours. Sepsis Labs: No results for input(s): PROCALCITON, LATICACIDVEN in the last 168 hours.  Recent Results (from the past 240 hour(s))  Urine culture     Status: None   Collection Time: 11/26/18  2:09 PM  Result Value Ref Range Status    Specimen Description   Final    URINE, CATHETERIZED Performed at [facility], [address], [place], [place] [address]    Special Requests   Final    Normal Performed at [facility], [address], [place], [place] [address]    Culture   Final    NO GROWTH Performed at [facility], [place] [address] [place], [place] [address]    Report Status 11/29/2018 FINAL  Final         Radiology Studies: No results found.      Scheduled Meds: . aspirin  81 mg Oral q morning - 10a  . atorvastatin  80 mg Oral QHS  . calcium carbonate  1,000 mg of elemental calcium Oral TID WC  . calcium carbonate  1,000 mg of elemental calcium Oral QHS  . chlorhexidine  15 mL Mouth Rinse BID  . cilostazol  50 mg Oral BID  . feeding supplement (ENSURE ENLIVE)  237 mL Oral QID  . fentaNYL  1 patch Transdermal Q72H  . fluticasone  1 spray Each Nare QPM  . heparin injection (subcutaneous)  5,000 Units Subcutaneous Q8H  . insulin aspart  0-9 Units Subcutaneous TID WC  . magic mouthwash  5 mL Oral TID AC & HS  . magnesium oxide  800 mg Oral BID  . mouth rinse  15 mL Mouth Rinse q12n4p  . metoprolol succinate  50 mg Oral Daily  . mirtazapine  7.5 mg Oral QHS  . nicotine  14 mg Transdermal Daily  . pantoprazole  40 mg Oral Daily   Continuous Infusions: . calcium gluconate    . magnesium sulfate 1 - 4 g bolus IVPB 4 g (12/03/18 0923)  . potassium chloride       LOS: 7 days    Time spent: 30 minutes    Srihan Brutus Darleen Crocker, DO [facility] Pager [phone]  If 7PM-7AM, please contact night-coverage [email or web address] Password [email or web address] 12/03/2018, 9:52 AM

## 2018-12-03 NOTE — Progress Notes (Signed)
CRITICAL VALUE ALERT  Critical Value:  K+ 2.5  Date & Time Notied:  12/03/2018 0620  Provider Notified: Dr. Olevia Bowens  Orders Received/Actions taken: Awaiting response.

## 2018-12-03 NOTE — Progress Notes (Signed)
Physical Therapy Treatment Patient Details Name: Fred Hernandez MRN: [id number] DOB: 1956/02/28 Today's Date: 12/03/2018    History of Present Illness Fred Hernandez is a 63 y.o. 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  He came to the ER because he couldn't pee.  He hasn't been able to eat and drink.  He peed once today.  His girlfriend has been getting on him - he has been declining in his PO intake x 3-4 weeks.  He just suddenly didn't feel like eating - despite previously eating 4 times a day.  He was 148 in 2/19; he was 109 the last time he was weighed at chemo.  +tobacco - 1/2 ppd.  Severe anorexia, no nausea.  He doesn't like the Marinol because it makes him feel high and so he won't take them.  He doesn't have difficulty with urination other than small and infrequent voids.  No fever.  Mild cough.    PT Comments    Patient presents with with slightly changed mentation, requires occasional repeated verbal/tactile cueing for safety and has difficulty attempting gait training without AD due to decreased standing balance with tendency to lean on nearby objects for support due to weakness.  Patient able to complete exercises with verbal cueing, tends to veer to left and right when walking and limited due to fatigue and labored breathing.  Patient tolerated sitting up in chair after therapy with family member present in room.  Patient will benefit from continued physical therapy in hospital and recommended venue below to increase strength, balance, endurance for safe ADLs and gait.   Follow Up Recommendations  SNF     Equipment Recommendations  Rolling walker with 5" wheels    Recommendations for Other Services       Precautions / Restrictions Precautions Precautions: Fall Restrictions Weight Bearing Restrictions: No    Mobility  Bed  Mobility Overal bed mobility: Modified Independent             General bed mobility comments: increased time  Transfers Overall transfer level: Needs assistance Equipment used: None;Rolling walker (2 wheeled) Transfers: Sit to/from [facility] Sit to Stand: Min assist Stand pivot transfers: Min assist       General transfer comment: unsteady with frequent leaning on nearby objects for support, required use of RW for safety  Ambulation/Gait Ambulation/Gait assistance: Min assist Gait Distance (Feet): 50 Feet Assistive device: Rolling walker (2 wheeled);None Gait Pattern/deviations: Decreased step length - right;Decreased step length - left;Decreased stride length;Drifts right/left Gait velocity: decreased   General Gait Details: unsteady slow labored cadence with occasional drifting left/right, had near loss of balance without AD, required use of RW for safety   Stairs             Wheelchair Mobility    Modified Rankin (Stroke Patients Only)       Balance Overall balance assessment: Needs assistance Sitting-balance support: Feet supported;No upper extremity supported Sitting balance-Leahy Scale: Good     Standing balance support: No upper extremity supported;During functional activity Standing balance-Leahy Scale: Poor Standing balance comment: fair/poor without AD, fair using RW                            Cognition Arousal/Alertness: Awake/alert Behavior During Therapy: WFL for tasks assessed/performed;Impulsive Overall Cognitive  Status: Impaired/Different from baseline Area of Impairment: Safety/judgement                         Safety/Judgement: Decreased awareness of safety     General Comments: requires repeated verbal/tactile cueing, occasional impulsive behavior      Exercises General Exercises - Lower Extremity Ankle Circles/Pumps: Seated;Strengthening;AROM;Both;10 reps Long Arc Quad:  Seated;Strengthening;AROM;Both;10 reps Hip Flexion/Marching: Seated;AROM;Strengthening;Both;10 reps    General Comments        Pertinent Vitals/Pain Pain Assessment: No/denies pain    Home Living                      Prior Function            PT Goals (current goals can now be found in the care plan section) Acute Rehab PT Goals Patient Stated Goal: return home with family/girl friend to assist PT Goal Formulation: With patient/family Time For Goal Achievement: 12/07/18 Potential to Achieve Goals: Good Progress towards PT goals: Progressing toward goals    Frequency    Min 3X/week      PT Plan Current plan remains appropriate    Co-evaluation              AM-PAC PT "6 Clicks" Mobility   Outcome Measure  Help needed turning from your back to your side while in a flat bed without using bedrails?: None Help needed moving from lying on your back to sitting on the side of a flat bed without using bedrails?: None Help needed moving to and from a bed to a chair (including a wheelchair)?: A Little Help needed standing up from a chair using your arms (e.g., wheelchair or bedside chair)?: A Little Help needed to walk in hospital room?: A Lot Help needed climbing 3-5 steps with a railing? : A Lot 6 Click Score: 18    End of Session Equipment Utilized During Treatment: Gait belt Activity Tolerance: Patient tolerated treatment well;Patient limited by fatigue Patient left: in chair;with call bell/phone within reach;with family/visitor present Nurse Communication: Mobility status PT Visit Diagnosis: Unsteadiness on feet (R26.81);Muscle weakness (generalized) (M62.81)     Time: 2500-3704 PT Time Calculation (min) (ACUTE ONLY): 28 min  Charges:  $Gait Training: 8-22 mins $Therapeutic Exercise: 8-22 mins                     12:05 PM, 12/03/18 [name], MPT Physical Therapist with [facility] [phone] office [phone] mobile  phone

## 2018-12-03 NOTE — [place] FL2 (Signed)
[place] MEDICAID FL2 LEVEL OF CARE SCREENING TOOL     IDENTIFICATION  Patient Name: Fred Hernandez: 07/25/1956 Sex: male Admission Date (Current Location): 11/26/2018  [facility] [place] Number:  [employer] and Address:  [facility] [address], [place]      Provider Number: [id number]  Attending Physician Name and Address:  Rodena Goldmann, DO  Relative Name and Phone Number:       Current Level of Care: Hospital Recommended Level of Care: [place] Prior Approval Number:    Date Approved/Denied:   PASRR Number:    Discharge Plan: SNF    Current Diagnoses: Patient Active Problem List   Diagnosis Date Noted  . Malnutrition of moderate degree 12/03/2018  . Palliative care by specialist   . DNR (do not resuscitate) discussion   . Acute renal failure (ARF) ([place]) 11/26/2018  . Anemia associated with chemotherapy 11/26/2018  . Goals of care, counseling/discussion 10/04/2018  . History of prostate cancer 06/26/2018  . Squamous cell carcinoma of right lung ([place]) 02/20/2018  . Adenocarcinoma of prostate ([place]) 01/17/2018  . History of colonic polyps 09/20/2016  . Essential hypertension 04/05/2016  . Hyperlipidemia 04/05/2016  . Diabetes mellitus with circulatory complication ([place]) 36/64/4034  . Tobacco abuse 04/05/2016  . Claudication ([place]) 04/04/2016  . Peripheral arterial disease ([place]) 03/16/2016  . Left shoulder pain 12/23/2015    Orientation RESPIRATION BLADDER Height & Weight     Self  Normal Continent Weight: 49.3 kg Height:  5\' 9"  (175.3 cm)  BEHAVIORAL SYMPTOMS/MOOD NEUROLOGICAL BOWEL NUTRITION STATUS  (none) (none) Continent (Soft)  AMBULATORY STATUS COMMUNICATION OF NEEDS Skin   Extensive Assist Verbally Normal                       Personal Care Assistance Level of Assistance  Bathing, Feeding, Dressing Bathing Assistance: Limited assistance Feeding assistance:  Independent Dressing Assistance: Limited assistance     Functional Limitations Info  Sight, Hearing, Speech Sight Info: Adequate Hearing Info: Adequate Speech Info: Adequate    SPECIAL CARE FACTORS FREQUENCY  PT (By licensed PT)     PT Frequency: PT 5X/W              Contractures Contractures Info: Not present    Additional Factors Info  Code Status, Allergies Code Status Info: Partial Allergies Info: Lisinopril, Penicillins, Ace Inhibitor           Current Medications (12/03/2018):  This is the current hospital active medication list Current Facility-Administered Medications  Medication Dose Route Frequency Provider Last Rate Last Dose  . acetaminophen (TYLENOL) tablet 650 mg  650 mg Oral Q6H PRN Karmen Bongo, MD   650 mg at 12/02/18 0118   Or  . acetaminophen (TYLENOL) suppository 650 mg  650 mg Rectal Q6H PRN Karmen Bongo, MD      . aspirin chewable tablet 81 mg  81 mg Oral q morning - 10a Karmen Bongo, MD   81 mg at 12/03/18 0909  . atorvastatin (LIPITOR) tablet 80 mg  80 mg Oral Ivery Quale, MD   80 mg at 12/01/18 2220  . calcium carbonate (OS-CAL - dosed in mg of elemental calcium) tablet 1,000 mg of elemental calcium  1,000 mg of elemental calcium Oral TID WC Elmarie Shiley, MD   1,000 mg of elemental calcium at 12/03/18 1217  . calcium carbonate (OS-CAL - dosed in mg of elemental calcium) tablet 1,000 mg of  elemental calcium  1,000 mg of elemental calcium Oral QHS Elmarie Shiley, MD      . camphor-menthol [facility]) lotion 1 application  1 application Topical F0X PRN Karmen Bongo, MD       And  . hydrOXYzine (ATARAX/VISTARIL) tablet 25 mg  25 mg Oral Q8H PRN Karmen Bongo, MD      . chlorhexidine (PERIDEX) 0.12 % solution 15 mL  15 mL Mouth Rinse BID Karmen Bongo, MD   15 mL at 12/02/18 0825  . cilostazol (PLETAL) tablet 50 mg  50 mg Oral BID Karmen Bongo, MD   50 mg at 12/03/18 0909  . docusate sodium (ENEMEEZ) enema 283 mg  1 enema Rectal PRN  Karmen Bongo, MD      . feeding supplement (ENSURE ENLIVE) (ENSURE ENLIVE) liquid 237 mL  237 mL Oral QID Manuella Ghazi, Pratik D, DO   237 mL at 12/02/18 1257  . fentaNYL (DURAGESIC) 50 MCG/HR 1 patch  1 patch Transdermal Q72H Karmen Bongo, MD   1 patch at 12/02/18 0825  . fluticasone (FLONASE) 50 MCG/ACT nasal spray 1 spray  1 spray Each Nare QPM Karmen Bongo, MD   1 spray at 12/02/18 1718  . heparin injection 5,000 Units  5,000 Units Subcutaneous Skipper Cliche, MD   5,000 Units at 12/03/18 1218  . hydrALAZINE (APRESOLINE) injection 10 mg  10 mg Intravenous Q4H PRN Manuella Ghazi, Pratik D, DO      . HYDROmorphone (DILAUDID) tablet 4 mg  4 mg Oral Q2H PRN Karmen Bongo, MD   4 mg at 11/29/18 2103  . insulin aspart (novoLOG) injection 0-9 Units  0-9 Units Subcutaneous TID WC Karmen Bongo, MD   1 Units at 12/03/18 1218  . ipratropium-albuterol (DUONEB) 0.5-2.5 (3) MG/3ML nebulizer solution 3 mL  3 mL Nebulization Q6H PRN Karmen Bongo, MD      . magic mouthwash  5 mL Oral TID AC & HS Karmen Bongo, MD   5 mL at 12/03/18 0909  . magnesium oxide (MAG-OX) tablet 800 mg  800 mg Oral BID Elmarie Shiley, MD   800 mg at 12/03/18 0908  . MEDLINE mouth rinse  15 mL Mouth Rinse q12n4p Karmen Bongo, MD   15 mL at 11/30/18 1548  . metoprolol succinate (TOPROL-XL) 24 hr tablet 50 mg  50 mg Oral Daily Karmen Bongo, MD   50 mg at 12/03/18 0908  . mirtazapine (REMERON) tablet 7.5 mg  7.5 mg Oral QHS Shah, Pratik D, DO   7.5 mg at 12/01/18 2220  . nicotine (NICODERM CQ - dosed in mg/24 hours) patch 14 mg  14 mg Transdermal Daily Karmen Bongo, MD   14 mg at 12/03/18 0910  . ondansetron (ZOFRAN) tablet 4 mg  4 mg Oral Q6H PRN Karmen Bongo, MD       Or  . ondansetron [facility]) injection 4 mg  4 mg Intravenous Q6H PRN Karmen Bongo, MD   4 mg at 11/28/18 0007  . pantoprazole (PROTONIX) EC tablet 40 mg  40 mg Oral Daily Elmarie Shiley, MD   40 mg at 12/03/18 0908  . potassium chloride 10 mEq in 100 mL IVPB  10  mEq Intravenous Q1 Hr x 6 Heath Lark D, DO 100 mL/hr at 12/03/18 1432 10 mEq at 12/03/18 1432  . sorbitol 70 % solution 30 mL  30 mL Oral PRN Karmen Bongo, MD      . zolpidem Lorrin Mais) tablet 5 mg  5 mg Oral QHS PRN Karmen Bongo, MD  Discharge Medications: Please see discharge summary for a list of discharge medications.  Relevant Imaging Results:  Relevant Lab Results:   Additional [place], LCSW

## 2018-12-03 NOTE — Care Management Important Message (Signed)
Important Message  Patient Details  Name: Fred Hernandez MRN: [id number] Date of Birth: 1956/10/03   Medicare Important Message Given:  Yes    Tommy Medal 12/03/2018, 11:25 AM

## 2018-12-04 ENCOUNTER — Ambulatory Visit (HOSPITAL_COMMUNITY): Admission: RE | Admit: 2018-12-04 | Payer: Medicare Other | Source: Ambulatory Visit

## 2018-12-04 ENCOUNTER — Encounter (HOSPITAL_COMMUNITY): Payer: Self-pay | Admitting: *Deleted

## 2018-12-04 ENCOUNTER — Ambulatory Visit (HOSPITAL_COMMUNITY): Payer: Medicare Other

## 2018-12-04 LAB — GLUCOSE, CAPILLARY
Glucose-Capillary: 126 mg/dL — ABNORMAL HIGH (ref 70–99)
Glucose-Capillary: 199 mg/dL — ABNORMAL HIGH (ref 70–99)
Glucose-Capillary: 67 mg/dL — ABNORMAL LOW (ref 70–99)
Glucose-Capillary: 107 mg/dL — ABNORMAL HIGH (ref 70–99)
Glucose-Capillary: 106 mg/dL — ABNORMAL HIGH (ref 70–99)

## 2018-12-04 LAB — RENAL FUNCTION PANEL
Albumin: 2.2 g/dL — ABNORMAL LOW (ref 3.5–5.0)
Anion gap: 11 (ref 5–15)
BUN: 44 mg/dL — ABNORMAL HIGH (ref 8–23)
CHLORIDE: 109 mmol/L (ref 98–111)
CO2: 16 mmol/L — AB (ref 22–32)
Calcium: 6.9 mg/dL — ABNORMAL LOW (ref 8.9–10.3)
Creatinine, Ser: 3.54 mg/dL — ABNORMAL HIGH (ref 0.61–1.24)
GFR calc Af Amer: 20 mL/min — ABNORMAL LOW (ref >60)
GFR calc non Af Amer: 17 mL/min — ABNORMAL LOW (ref >60)
Glucose, Bld: 116 mg/dL — ABNORMAL HIGH (ref 70–99)
Phosphorus: 2.2 mg/dL — ABNORMAL LOW (ref 2.5–4.6)
Potassium: 2.3 mmol/L — CL (ref 3.5–5.1)
Sodium: 136 mmol/L (ref 135–145)

## 2018-12-04 LAB — MAGNESIUM: Magnesium: 2 mg/dL (ref 1.7–2.4)

## 2018-12-04 MED ORDER — MAGNESIUM SULFATE 2 GM/50ML IV SOLN
2.0000 g | Freq: Once | INTRAVENOUS | Status: AC
  Administered 2018-12-04: 2 g via INTRAVENOUS
  Filled 2018-12-04: qty 50

## 2018-12-04 MED ORDER — POTASSIUM CHLORIDE 20 MEQ/15ML (10%) PO SOLN
40.0000 meq | Freq: Three times a day (TID) | ORAL | Status: AC
  Administered 2018-12-04: 40 meq via ORAL
  Filled 2018-12-04 (×2): qty 30

## 2018-12-04 MED ORDER — POTASSIUM CHLORIDE CRYS ER 20 MEQ PO TBCR: 40.0000 meq | EXTENDED_RELEASE_TABLET | Freq: Once | ORAL | Status: DC

## 2018-12-04 MED ORDER — POTASSIUM & SODIUM PHOSPHATES 280-160-250 MG PO PACK
500.0000 mg | PACK | Freq: Three times a day (TID) | ORAL | Status: AC
  Administered 2018-12-04: 500 mg via ORAL
  Filled 2018-12-04: qty 2

## 2018-12-04 MED ORDER — GUAIFENESIN-DM 100-10 MG/5ML PO SYRP: 5.0000 mL | ORAL_SOLUTION | ORAL | Status: DC | PRN

## 2018-12-04 MED ORDER — POTASSIUM CHLORIDE 10 MEQ/100ML IV SOLN
10.0000 meq | INTRAVENOUS | Status: AC
  Administered 2018-12-04 (×6): 10 meq via INTRAVENOUS
  Filled 2018-12-04 (×6): qty 100

## 2018-12-04 NOTE — Progress Notes (Signed)
I spoke with [name], patient's fiance, and she states that patient is going to be discharged to [facility]. She states that they want him to get better before pursuing any additional chemotherapy treatments.  I advised her to let us know how he was doing and if he wants to come back in the future we would be glad to see him.  She verbalizes understanding.

## 2018-12-04 NOTE — Progress Notes (Signed)
PROGRESS NOTE    Fred Hernandez  NKN:[id number] DOB: 07/10/1956 DOA: 11/26/2018 PCP: Vidal Schwalbe, MD   Brief Narrative:  63 year old male with PMH of hypertension, PVD s/p stents, CAD s/p stent, DN type 2, HLD, COPD with stage IIb/IIIa SCC of RUL with local extension and prostate CA. Patient came to the ED due to inability to urinate. His oral intake has been poor and progressively decreasing. Family at bedside reported he was vomiting at home and spitting while in the hospital. Denies nausea, fevers, chest pain, SOB, abdominal discomfort, melena, hematochezia.  Patient was admitted with AKI secondary likely to ATN with Zometa use and persistent hypocalcemia as well as other electrolyte derangements.  He continues to have some significant hypokalemia over the last couple days, but has shown great improvement in kidney function otherwise. Appreciate nephrology assistance who will now sign off, but will be available as needed.   Assessment & Plan:   Principal Problem:   Acute renal failure (ARF) (HCC) Active Problems:   Essential hypertension   Diabetes mellitus with circulatory complication (HCC)   Tobacco abuse   Squamous cell carcinoma of right lung (HCC)   Anemia associated with chemotherapy   Palliative care by specialist   DNR (do not resuscitate) discussion   Malnutrition of moderate degree  Acute renal failure-improving and likely secondary to ATN -Creatinineyesterday was5.03 and is now 3.54 -Likelydue to ATN with Zometa usealong with dehydration and NSAIDs -renal US also demonstrated distended bladder, but no hydronephrosis -Continue followingup renal function panel -Continue monitor strict I's and O's with urine output of 900 in the last 24 hours. -Appreciate nephrology assistance and it appears to likely be related to Zometa will be available peripherally as needed.  Lung CA/with chronic pain -NSCLC diagnosed in 2/19 -Continuefentanyl patch (50 mcg) as well as  Dilaudid 2 mg PO q2h. -Continue [facility] mouthwash forthrush -will hold chemotherapy while inpatient -tobacco cessation encouraged -Appreciate palliative care discussions for goals of care as patient is now DNI  Cachexia-severe protein calorie malnutrition-improving -We will start trial of Remeron1/31 -Patient does appear to have some improvement in appetite today.  Continue to monitor  HTN -Continue Toprol XL at increased dose due to tachycardia;noted to have tachycardia overnight which required IV metoprolol -Restart some of the home antihypertensive agents for better control.  DM -Continue following glucose trend -ContinueholdingGlucophage while inpatient -will use SSI  Anemia -stable -willcheck repeat CBC as needed if there is any overt bleeding  Tobacco dependence -I have discussed tobacco cessation with the patient. I have counseled the patient regarding the negative impacts of continued tobacco use including but not limited to lung cancer, COPD, and cardiovascular disease. I have discussed alternatives to tobacco and modalities that may help facilitate tobacco cessation including but not limited to biofeedback, hypnosis, and medications. Total time spent with tobacco counseling was4 minutes. -Continue the use of nicotine patch  Hypocalcemia-persistent, but improved -Continue oral supplementation until corrected calcium consistently greater than 8.5 -Noted to have elevated parathyroid hormone levelsand calcitriol started along with calcium carbonate -Continue to monitor and appreciate nephrology assistance  Hypokalemia-persistent -We will recheck magnesium in a.m. along with renal panel -Replete IV today as well as oral with elixir and recheck in a.m.   DVT prophylaxis:heparin Code Status:DNI Family Communication:Fianc at bedside Disposition Plan:Transfer to inpatient rehabilitation as determined by PT once electrolytes are stable which could be  the next 1 to 2 days.  Consultants:  Nephrology   Palliative Care  Procedures:  None  Antimicrobials:    Anti-infectives(From admission, onward)   None    Subjective: Patient seen and evaluated today with no new acute complaints or concerns. No acute concerns or events noted overnight.  He continues to struggle with his appetite, but this appears to be slowly improving now that his renal function has improved as well.  He is noticing that he would like to drink some more ensures and have some cranberry juice.  He continues to have low potassium levels and refused to take oral supplementation yesterday.  He is agreeable to liquid oral supplementation today.  Objective: Vitals:   12/03/18 0550 12/03/18 1433 12/03/18 2143 12/04/18 0554  BP: (!) 137/98 120/82 100/68 125/66  Pulse: (!) 124 (!) 109 (!) 103 (!) 104  Resp:      Temp: 98.6 F (37 C) 98.1 F (36.7 C) 99.3 F (37.4 C) 98.3 F (36.8 C)  TempSrc: Oral Oral Oral Oral  SpO2: 98% 99% 100% 100%  Weight:      Height:        Intake/Output Summary (Last 24 hours) at 12/04/2018 1127 Last data filed at 12/03/2018 2030 Gross per 24 hour  Intake 120 ml  Output 900 ml  Net -780 ml   Filed Weights   11/26/18 1337 11/26/18 2057  Weight: 47 kg 49.3 kg    Examination:  General exam: Appears calm and comfortable  Respiratory system: Clear to auscultation. Respiratory effort normal. Cardiovascular system: S1 & S2 heard, RRR. No JVD, murmurs, rubs, gallops or clicks. No pedal edema. Gastrointestinal system: Abdomen is nondistended, soft and nontender. No organomegaly or masses felt. Normal bowel sounds heard. Central nervous system: Alert and oriented. No focal neurological deficits. Extremities: Symmetric 5 x 5 power. Skin: No rashes, lesions or ulcers Psychiatry: Judgement and insight appear normal. Mood & affect appropriate.     Data Reviewed: I have personally reviewed following labs and imaging  studies  CBC: Recent Labs  Lab 11/29/18 0515  WBC 10.7*  HGB 8.0*  HCT 23.8*  MCV 78.5*  PLT 616   Basic Metabolic Panel: Recent Labs  Lab 11/29/18 0515 11/30/18 0508  12/01/18 0534  12/02/18 0820 12/02/18 1702 12/03/18 0540 12/03/18 1648 12/04/18 0607  NA 130* 130*   < > 131*   < > 136 137 138 137 136  K 3.2* 3.2*   < > 3.8   < > 3.0* 3.1* 2.5* 3.0* 2.3*  CL 78* 85*   < > 93*   < > 100 104 108 108 109  CO2 32 26   < > 23   < > 20* 19* 18* 18* 16*  GLUCOSE 106* 97   < > 100*   < > 98 124* 150* 114* 116*  BUN 116* 103*   < > 91*   < > 73* 71* 61* 52* 44*  CREATININE 8.72* 8.20*   < > 7.35*   < > 5.91* 5.47* 5.03* 4.33* 3.54*  CALCIUM 5.4* 5.9*   < > 6.4*   < > 6.1* 6.4* 6.5* 7.0* 6.9*  MG 1.3* 1.9  --  1.8  --   --   --  1.4*  --  2.0  PHOS 5.0* 4.1  --   --   --  2.9  --  3.4  --  2.2*   < > = values in this interval not displayed.   GFR: Estimated Creatinine Clearance: 15.1 mL/min (A) (by C-G formula based on SCr of 3.54 mg/dL (H)). Liver Function Tests: Recent  Labs  Lab 11/28/18 0704 11/30/18 0508 12/02/18 0820 12/03/18 0540 12/04/18 0607  ALBUMIN 2.4* 2.1* 2.2* 2.2* 2.2*   No results for input(s): LIPASE, AMYLASE in the last 168 hours. No results for input(s): AMMONIA in the last 168 hours. Coagulation Profile: No results for input(s): INR, PROTIME in the last 168 hours. Cardiac Enzymes: No results for input(s): CKTOTAL, CKMB, CKMBINDEX, TROPONINI in the last 168 hours. BNP (last 3 results) No results for input(s): PROBNP in the last 8760 hours. HbA1C: No results for input(s): HGBA1C in the last 72 hours. CBG: Recent Labs  Lab 12/03/18 1201 12/03/18 1619 12/03/18 2144 12/04/18 0722 12/04/18 1118  GLUCAP 140* 94 140* 126* 199*   Lipid Profile: No results for input(s): CHOL, HDL, LDLCALC, TRIG, CHOLHDL, LDLDIRECT in the last 72 hours. Thyroid Function Tests: No results for input(s): TSH, T4TOTAL, FREET4, T3FREE, THYROIDAB in the last 72  hours. Anemia Panel: No results for input(s): VITAMINB12, FOLATE, FERRITIN, TIBC, IRON, RETICCTPCT in the last 72 hours. Sepsis Labs: No results for input(s): PROCALCITON, LATICACIDVEN in the last 168 hours.  Recent Results (from the past 240 hour(s))  Urine culture     Status: None   Collection Time: 11/26/18  2:09 PM  Result Value Ref Range Status   Specimen Description   Final    URINE, CATHETERIZED Performed at [facility], [address], [place], [place] [address]    Special Requests   Final    Normal Performed at [facility], [address] [place], [place] [address]    Culture   Final    NO GROWTH Performed at [facility], [place] [address], [place], [place] [address]    Report Status 11/29/2018 FINAL  Final         Radiology Studies: No results found.      Scheduled Meds: . aspirin  81 mg Oral q morning - 10a  . atorvastatin  80 mg Oral QHS  . calcium carbonate  1,000 mg of elemental calcium Oral TID WC  . calcium carbonate  1,000 mg of elemental calcium Oral QHS  . chlorhexidine  15 mL Mouth Rinse BID  . cilostazol  50 mg Oral BID  . feeding supplement (ENSURE ENLIVE)  237 mL Oral QID  . fentaNYL  1 patch Transdermal Q72H  . fluticasone  1 spray Each Nare QPM  . heparin injection (subcutaneous)  5,000 Units Subcutaneous Q8H  . insulin aspart  0-9 Units Subcutaneous TID WC  . magic mouthwash  5 mL Oral TID AC & HS  . magnesium oxide  800 mg Oral BID  . mouth rinse  15 mL Mouth Rinse q12n4p  . metoprolol succinate  50 mg Oral Daily  . mirtazapine  7.5 mg Oral QHS  . nicotine  14 mg Transdermal Daily  . pantoprazole  40 mg Oral Daily  . potassium & sodium phosphates  500 mg Oral TID WC & HS  . potassium chloride  40 mEq Oral TID   Continuous Infusions: . magnesium sulfate 1 - 4 g bolus IVPB 2 g (12/04/18 1032)  . potassium chloride       LOS: 8 days    Time spent: 30 minutes    Pratik Darleen Crocker, DO [facility] Pager  [phone]  If 7PM-7AM, please contact night-coverage [email or web address] Password [email or web address] 12/04/2018, 11:27 AM

## 2018-12-04 NOTE — Clinical Social Work Placement (Signed)
   CLINICAL SOCIAL WORK PLACEMENT  NOTE  Date:  12/04/2018  Patient Details  Name: Fred Hernandez MRN: [id number] Date of Birth: 09/24/1956  Clinical Social Work is seeking post-discharge placement for this patient at the [place] level of care (*CSW will initial, date and re-position this form in  chart as items are completed):  Yes   Patient/family provided with [place] Work Department's list of facilities offering this level of care within the geographic area requested by the patient (or if unable, by the patient's family).  Yes   Patient/family informed of their freedom to choose among providers that offer the needed level of care, that participate in Medicare, Medicaid or managed care program needed by the patient, have an available bed and are willing to accept the patient.  No   Patient/family informed of [facility]'s ownership interest in [facility] and [facility], as well as of the fact that they are under no obligation to receive care at these facilities.  PASRR submitted to EDS on       PASRR number received on       Existing PASRR number confirmed on       FL2 transmitted to all facilities in geographic area requested by pt/family on 12/03/18     FL2 transmitted to all facilities within larger geographic area on       Patient informed that his/her managed care company has contracts with or will negotiate with certain facilities, including the following:        Yes   Patient/family informed of bed offers received.  Patient chooses bed at [facility]     Physician recommends and patient chooses bed at      Patient to be transferred to   on  .  Patient to be transferred to facility by       Patient family notified on   of transfer.  Name of family member notified:        PHYSICIAN       Additional Comment: Bed offer at [facility] in [place].  Patient and family happy that they got their first  choice.  Called Myriam Jacobson in admissions who states they get authorization same day from [facility], so will not submit auth request until day of d/c.  Told her we would let her know when we know-possibly tomorrow.   _______________________________________________ Trish Mage, LCSW 12/04/2018, 10:35 AM

## 2018-12-04 NOTE — Progress Notes (Signed)
Physical Therapy Treatment Patient Details Name: Fred Hernandez MRN: [id number] DOB: 06-09-1956 Today's Date: 12/04/2018    History of Present Illness Fred Hernandez is a 63 y.o. 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  He came to the ER because he couldn't pee.  He hasn't been able to eat and drink.  He peed once today.  His girlfriend has been getting on him - he has been declining in his PO intake x 3-4 weeks.  He just suddenly didn't feel like eating - despite previously eating 4 times a day.  He was 148 in 2/19; he was 109 the last time he was weighed at chemo.  +tobacco - 1/2 ppd.  Severe anorexia, no nausea.  He doesn't like the Marinol because it makes him feel high and so he won't take them.  He doesn't have difficulty with urination other than small and infrequent voids.  No fever.  Mild cough.    PT Comments    Patient presents supine in bed with sister at bedside and agreeable to therapy. Patient agitated initially, but with improving mood. Patient refuses gait training in hallway today reporting "I don't feel like it" but agrees to ambulate tomorrow. Patient tolerates seated BLE strengthening exercises with demonstration and verbal cues. Patient denies pain throughout therapy. Patient requires min assist with RW for transfers due to wanting to perform without AD despite education. Nurse in room to manage blood from patient's abdominal region on hospital gown and to administer medication. Patient left up in chair with BLE elevated, chair alarm on, and call bell in lap with sister at bedside. Patient will benefit from continued physical therapy in hospital and recommended venue below to increase strength, balance, endurance for safe ADLs and gait.    Follow Up Recommendations  SNF     Equipment Recommendations  Rolling walker with 5"  wheels    Recommendations for Other Services       Precautions / Restrictions Precautions Precautions: Fall Restrictions Weight Bearing Restrictions: No    Mobility  Bed Mobility Overal bed mobility: Modified Independent             General bed mobility comments: increased time  Transfers Overall transfer level: Needs assistance Equipment used: Rolling walker (2 wheeled) Transfers: Sit to/from [facility] Sit to Stand: Min guard Stand pivot transfers: Min guard       General transfer comment: increased time, attempting to perform without RW requiring min assist for RW management  Ambulation/Gait Ambulation/Gait assistance: Min assist Gait Distance (Feet): 4 Feet Assistive device: Rolling walker (2 wheeled);None Gait Pattern/deviations: Decreased step length - right;Decreased step length - left;Decreased stride length Gait velocity: decreased   General Gait Details: labored 4-5 steps at bedside attempting to complete without RW requiring min assist for RW management   Stairs             Wheelchair Mobility    Modified Rankin (Stroke Patients Only)       Balance Overall balance assessment: Needs assistance Sitting-balance support: Feet supported;No upper extremity supported Sitting balance-Leahy Scale: Good Sitting balance - Comments: seated egde of bed   Standing balance support: During functional activity;Bilateral upper extremity supported Standing balance-Leahy Scale: Fair Standing balance comment: with RW  Cognition Arousal/Alertness: Awake/alert Behavior During Therapy: WFL for tasks assessed/performed Overall Cognitive Status: Within Functional Limits for tasks assessed                                        Exercises General Exercises - Lower Extremity Long Arc Quad: Seated;AROM;Strengthening;Both;15 reps Hip Flexion/Marching: Seated;AROM;Strengthening;Both;15  reps Toe Raises: Seated;AROM;Strengthening;Both;15 reps Heel Raises: Seated;AROM;Strengthening;Both;15 reps    General Comments        Pertinent Vitals/Pain Pain Assessment: No/denies pain    Home Living                      Prior Function            PT Goals (current goals can now be found in the care plan section) Acute Rehab PT Goals Patient Stated Goal: return home with family/girl friend to assist PT Goal Formulation: With patient/family Time For Goal Achievement: 12/07/18 Potential to Achieve Goals: Good Progress towards PT goals: Progressing toward goals    Frequency    Min 3X/week      PT Plan Current plan remains appropriate    Co-evaluation              AM-PAC PT "6 Clicks" Mobility   Outcome Measure  Help needed turning from your back to your side while in a flat bed without using bedrails?: None Help needed moving from lying on your back to sitting on the side of a flat bed without using bedrails?: None Help needed moving to and from a bed to a chair (including a wheelchair)?: A Little Help needed standing up from a chair using your arms (e.g., wheelchair or bedside chair)?: A Little Help needed to walk in hospital room?: A Lot Help needed climbing 3-5 steps with a railing? : A Lot 6 Click Score: 18    End of Session   Activity Tolerance: Patient tolerated treatment well;Patient limited by fatigue Patient left: in chair;with call bell/phone within reach;with chair alarm set;with family/visitor present Nurse Communication: Mobility status PT Visit Diagnosis: Unsteadiness on feet (R26.81);Muscle weakness (generalized) (M62.81)     Time: 6314-9702 PT Time Calculation (min) (ACUTE ONLY): 21 min  Charges:  $Therapeutic Exercise: 8-22 mins                     2:16 PM, 12/04/18 Talbot Grumbling, DPT Physical Therapist with [facility] [phone] office

## 2018-12-04 NOTE — Care Management Note (Signed)
Case Management Note  Patient Details  Name: Fred Hernandez MRN: [id number] Date of Birth: [birth year]  If discussed at Long Length of Stay Meetings, dates discussed:12/04/2018    Additional Comments:  Sherald Barge, RN 12/04/2018, 10:45 AM

## 2018-12-04 NOTE — Progress Notes (Addendum)
Patient ID: Fred Hernandez, male   DOB: 22-Mar-1956, 63 y.o.   MRN: [id number] [facility] Progress Note   Assessment/ Plan:   1.  Acute kidney injury:  Likely ATN from zoledronic acid in the setting of volume depletion and NSAID use.  Continues to show renal recovery with excellent urine output and downtrending creatinine.  Has some associated electrolyte depletion.  Discontinue Foley catheter. 2.  Metabolic acidosis:  Mild, will begin oral sodium bicarbonate which would likely be transient. 3.  Non-small cell lung cancer: Recently on palliative chemotherapy with carboplatin and gemcitabine.  To follow-up as an outpatient with oncology stop 4.  Failure to thrive/cachexia 5.  Anemia: Associated with recent malignancy/ongoing chemotherapy 6.  Hypocalcemia: Likely effect of zoledronic acid and sustained by polyuria-we will need to continue oral supplementation until corrected calcium consistently >8.5 and then consider tapering this down to discontinuation. 7.  Hypomagnesemia:  Possibly carboplatinum induced with tubular toxicity, replaced with oral and intravenous magnesium. 8.  Hypokalemia: Secondary to polyuria-supplement via oral route (patient agreeable to taking elixir).  Renal service will sign off at this time and remain available for questions or concerns.  I recommend follow-up with his PCP with metabolic panel recommended weekly until renal function is back to baseline.  Subjective:   Complains of cough with productive sputum overnight.  Unable to take potassium tablets overnight (size/dysphagia).   Objective:   BP 125/66 (BP Location: Right Arm)   Pulse (!) 104   Temp 98.3 F (36.8 C) (Oral)   Resp 20   Ht 5\' 9"  (1.753 m)   Wt 49.3 kg   SpO2 100%   BMI 16.05 kg/m   Intake/Output Summary (Last 24 hours) at 12/04/2018 0902 Last data filed at 12/03/2018 2030 Gross per 24 hour  Intake 120 ml  Output 900 ml  Net -780 ml   Weight change:   Physical Exam: Gen:  Comfortably resting in bed CVS: Pulse regular rhythm, normal rate, S1 and S2 normal Resp: Coarse breath sounds bilaterally with some expiratory rhonchi right upper lung Abd: Soft, flat, nontender Ext: No lower extremity edema  Imaging: No results found.  Labs: BMET Recent Labs  Lab 11/28/18 3244 11/29/18 0515 11/30/18 0102  12/01/18 0534 12/01/18 1742 12/02/18 0820 12/02/18 1702 12/03/18 0540 12/03/18 1648 12/04/18 0607  NA 129*  128* 130* 130*   < > 131* 133* 136 137 138 137 136  K 3.6  3.7 3.2* 3.2*   < > 3.8 3.7 3.0* 3.1* 2.5* 3.0* 2.3*  CL 73*  73* 78* 85*   < > 93* 95* 100 104 108 108 109  CO2 29  29 32 26   < > 23 21* 20* 19* 18* 18* 16*  GLUCOSE 141*  140* 106* 97   < > 100* 155* 98 124* 150* 114* 116*  BUN 114*  114* 116* 103*   < > 91* 86* 73* 71* 61* 52* 44*  CREATININE 8.83*  8.76* 8.72* 8.20*   < > 7.35* 7.09* 5.91* 5.47* 5.03* 4.33* 3.54*  CALCIUM 6.0*  6.0* 5.4* 5.9*   < > 6.4* 6.5* 6.1* 6.4* 6.5* 7.0* 6.9*  PHOS 4.5 5.0* 4.1  --   --   --  2.9  --  3.4  --  2.2*   < > = values in this interval not displayed.   CBC Recent Labs  Lab 11/29/18 0515  WBC 10.7*  HGB 8.0*  HCT 23.8*  MCV 78.5*  PLT 383  Medications:    . aspirin  81 mg Oral q morning - 10a  . atorvastatin  80 mg Oral QHS  . calcium carbonate  1,000 mg of elemental calcium Oral TID WC  . calcium carbonate  1,000 mg of elemental calcium Oral QHS  . chlorhexidine  15 mL Mouth Rinse BID  . cilostazol  50 mg Oral BID  . feeding supplement (ENSURE ENLIVE)  237 mL Oral QID  . fentaNYL  1 patch Transdermal Q72H  . fluticasone  1 spray Each Nare QPM  . heparin injection (subcutaneous)  5,000 Units Subcutaneous Q8H  . insulin aspart  0-9 Units Subcutaneous TID WC  . magic mouthwash  5 mL Oral TID AC & HS  . magnesium oxide  800 mg Oral BID  . mouth rinse  15 mL Mouth Rinse q12n4p  . metoprolol succinate  50 mg Oral Daily  . mirtazapine  7.5 mg Oral QHS  . nicotine  14 mg  Transdermal Daily  . pantoprazole  40 mg Oral Daily  . potassium & sodium phosphates  500 mg Oral TID WC & HS  . potassium chloride  40 mEq Oral Once   Elmarie Shiley, MD 12/04/2018, 9:02 AM

## 2018-12-05 DIAGNOSIS — E440 Moderate protein-calorie malnutrition: Secondary | ICD-10-CM

## 2018-12-05 DIAGNOSIS — N179 Acute kidney failure, unspecified: Secondary | ICD-10-CM

## 2018-12-05 LAB — BASIC METABOLIC PANEL
Anion gap: 11 (ref 5–15)
BUN: 32 mg/dL — AB (ref 8–23)
CO2: 16 mmol/L — ABNORMAL LOW (ref 22–32)
Calcium: 7.1 mg/dL — ABNORMAL LOW (ref 8.9–10.3)
Chloride: 107 mmol/L (ref 98–111)
Creatinine, Ser: 2.55 mg/dL — ABNORMAL HIGH (ref 0.61–1.24)
GFR calc Af Amer: 30 mL/min — ABNORMAL LOW (ref >60)
GFR calc non Af Amer: 26 mL/min — ABNORMAL LOW (ref >60)
Glucose, Bld: 133 mg/dL — ABNORMAL HIGH (ref 70–99)
Potassium: 2 mmol/L — CL (ref 3.5–5.1)
SODIUM: 134 mmol/L — AB (ref 135–145)

## 2018-12-05 LAB — GLUCOSE, CAPILLARY
GLUCOSE-CAPILLARY: 137 mg/dL — AB (ref 70–99)
Glucose-Capillary: 121 mg/dL — ABNORMAL HIGH (ref 70–99)
Glucose-Capillary: 90 mg/dL (ref 70–99)
Glucose-Capillary: 98 mg/dL (ref 70–99)

## 2018-12-05 LAB — MAGNESIUM: MAGNESIUM: 1.9 mg/dL (ref 1.7–2.4)

## 2018-12-05 MED ORDER — POTASSIUM CHLORIDE 10 MEQ/100ML IV SOLN
10.0000 meq | INTRAVENOUS | Status: AC
  Administered 2018-12-05 – 2018-12-06 (×3): 10 meq via INTRAVENOUS
  Filled 2018-12-05 (×2): qty 100

## 2018-12-05 MED ORDER — POTASSIUM CHLORIDE CRYS ER 20 MEQ PO TBCR
40.0000 meq | EXTENDED_RELEASE_TABLET | ORAL | Status: AC
  Administered 2018-12-05 (×3): 40 meq via ORAL
  Filled 2018-12-05 (×3): qty 2

## 2018-12-05 NOTE — Progress Notes (Signed)
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 Patient came to the ED due to inability to urinate. His oral intake has been poor and progressively decreasing. Family at bedside reported he was vomiting at home and spitting while in the hospital. Denies nausea, fevers, chest pain, SOB, abdominal discomfort, melena, hematochezia.  Patient was admitted with AKI secondary likely to ATN with Zometa use and persistent hypocalcemia as well as other electrolyte derangements.  He continues to have some significant hypokalemia over the last couple days, but has shown great improvement in kidney function otherwise. Appreciate nephrology assistance who will now sign off, but will be available as needed.   Assessment & Plan:   Principal Problem:   Acute renal failure (ARF) (HCC) Active Problems:   Essential hypertension   Diabetes mellitus with circulatory complication (HCC)   Tobacco abuse   Squamous cell carcinoma of right lung (HCC)   Anemia associated with chemotherapy   Palliative care by specialist   DNR (do not resuscitate) discussion   Malnutrition of moderate degree  Acute renal failure-improving and likely secondary to ATN -Creatinineyesterday was5.03 and is now 3.54 -Likelydue to ATN with Zometa usealong with dehydration and NSAIDs -renal US also demonstrated distended bladder, but no hydronephrosis -Continue followingup renal function panel -Continue monitor strict I's and O's -Appreciate nephrology assistance -Cr down to 2.5 now  Lung CA/with chronic pain -NSCLC diagnosed in 2/19 -Continuefentanyl patch (50 mcg) as well as Dilaudid 2 mg PO q2h. -Continue [facility] mouthwash forthrush -will hold chemotherapy while inpatient -tobacco  cessation encouraged -Appreciate palliative care discussions for goals of care as patient is now DNI  Cachexia-severe protein calorie malnutrition-improving -Patient does appear to have some improvement in appetite today.   -continue Remeron.  HTN -Continue Toprol XL at increased dose due to tachycardia. -continue current antihypertensive agents -BP better control.  DM -Continue following glucose trend -ContinueholdingGlucophage while inpatient -will continue use SSI  Anemia -stable; and associated with chronic renal failure. -willfollow trend intermittently  Tobacco dependence -cessation counseling provided. -Continue the use of nicotine patch  Hypocalcemia-persistent, but improved -Continue oral supplementation until corrected calcium consistently greater than 8.5 -Noted to have elevated parathyroid hormone levelsand calcitriol started along with calcium carbonate. -Continue to monitor and appreciate nephrology assistance and rec's.  Hypokalemia-persistent -will recheck magnesium in a.m. along with BMET -Replete IV today as well as oral with Kdur and follow trend.   DVT prophylaxis:heparin Code Status:DNI Family Communication:Fianc at bedside Disposition Plan:Transfer to inpatient rehabilitation as determined by PT once electrolytes are stable. Hopefully tomorrow.  Consultants:  Nephrology   Palliative Care  Procedures:  None  Antimicrobials:    Anti-infectives(From admission, onward)   None    Subjective: Denies chest pain or shortness of breath; appetite is slightly improved.  He reports feeling weak and deconditioned.  No nausea or vomiting.  Objective: Vitals:   12/05/18 1348 12/05/18 1449 12/05/18 1700 12/05/18 2101  BP: (!) 63/50 (!) 86/54 98/71   Pulse: (!) 108     Resp: 18     Temp: 98.7 F (37.1 C)     TempSrc: Oral     SpO2: 99%   99%  Weight:      Height:  Intake/Output Summary (Last 24 hours)  at 12/05/2018 2213 Last data filed at 12/05/2018 1831 Gross per 24 hour  Intake 360 ml  Output 800 ml  Net -440 ml   Filed Weights   11/26/18 1337 11/26/18 2057  Weight: 47 kg 49.3 kg    Examination: General exam: Alert, awake, oriented x 3; no fever, denies CP and SOB. Reported appetite is better. Patient chronically ill in appearance, weak and deconditioned.  Respiratory system: Clear to auscultation. Respiratory effort normal. Cardiovascular system:RRR. No murmurs, rubs, gallops. Gastrointestinal system: Abdomen is nondistended, soft and nontender. No organomegaly or masses felt. Normal bowel sounds heard. Central nervous system: Alert and oriented. No focal neurological deficits. Extremities: No C/C/E, +pedal pulses Skin: No rashes, lesions or ulcers Psychiatry: Judgement and insight appear normal. Mood & affect appropriate.    Data Reviewed: I have personally reviewed following labs and imaging studies  CBC: Recent Labs  Lab 11/29/18 0515  WBC 10.7*  HGB 8.0*  HCT 23.8*  MCV 78.5*  PLT 878   Basic Metabolic Panel: Recent Labs  Lab 11/29/18 0515 11/30/18 0508  12/01/18 0534  12/02/18 0820 12/02/18 1702 12/03/18 0540 12/03/18 1648 12/04/18 0607 12/05/18 0536  NA 130* 130*   < > 131*   < > 136 137 138 137 136 134*  K 3.2* 3.2*   < > 3.8   < > 3.0* 3.1* 2.5* 3.0* 2.3* 2.0*  CL 78* 85*   < > 93*   < > 100 104 108 108 109 107  CO2 32 26   < > 23   < > 20* 19* 18* 18* 16* 16*  GLUCOSE 106* 97   < > 100*   < > 98 124* 150* 114* 116* 133*  BUN 116* 103*   < > 91*   < > 73* 71* 61* 52* 44* 32*  CREATININE 8.72* 8.20*   < > 7.35*   < > 5.91* 5.47* 5.03* 4.33* 3.54* 2.55*  CALCIUM 5.4* 5.9*   < > 6.4*   < > 6.1* 6.4* 6.5* 7.0* 6.9* 7.1*  MG 1.3* 1.9  --  1.8  --   --   --  1.4*  --  2.0 1.9  PHOS 5.0* 4.1  --   --   --  2.9  --  3.4  --  2.2*  --    < > = values in this interval not displayed.   GFR: Estimated Creatinine Clearance: 20.9 mL/min (A) (by C-G formula  based on SCr of 2.55 mg/dL (H)).   Liver Function Tests: Recent Labs  Lab 11/30/18 0508 12/02/18 0820 12/03/18 0540 12/04/18 0607  ALBUMIN 2.1* 2.2* 2.2* 2.2*   CBG: Recent Labs  Lab 12/04/18 1802 12/04/18 2226 12/05/18 0810 12/05/18 1102 12/05/18 1624  GLUCAP 107* 106* 137* 121* 90    Recent Results (from the past 240 hour(s))  Urine culture     Status: None   Collection Time: 11/26/18  2:09 PM  Result Value Ref Range Status   Specimen Description   Final    URINE, CATHETERIZED Performed at [facility], [address], [place], [place] [address]    Special Requests   Final    Normal Performed at [facility], [address] [place], [place] [address]    Culture   Final    NO GROWTH Performed at [facility], [place] [address], [place], [place] [address]    Report Status 11/29/2018 FINAL  Final  Radiology Studies: No results found.   Scheduled Meds: . aspirin  81 mg Oral q morning - 10a  . atorvastatin  80 mg Oral QHS  . calcium carbonate  1,000 mg of elemental calcium Oral TID WC  . calcium carbonate  1,000 mg of elemental calcium Oral QHS  . chlorhexidine  15 mL Mouth Rinse BID  . cilostazol  50 mg Oral BID  . feeding supplement (ENSURE ENLIVE)  237 mL Oral QID  . fentaNYL  1 patch Transdermal Q72H  . fluticasone  1 spray Each Nare QPM  . heparin injection (subcutaneous)  5,000 Units Subcutaneous Q8H  . insulin aspart  0-9 Units Subcutaneous TID WC  . magic mouthwash  5 mL Oral TID AC & HS  . magnesium oxide  800 mg Oral BID  . mouth rinse  15 mL Mouth Rinse q12n4p  . metoprolol succinate  50 mg Oral Daily  . mirtazapine  7.5 mg Oral QHS  . nicotine  14 mg Transdermal Daily  . pantoprazole  40 mg Oral Daily   Continuous Infusions: . potassium chloride       LOS: 9 days    Time spent: 30 minutes    Barton Dubois, MD [facility] Pager [phone]  12/05/2018, 10:13 PM

## 2018-12-05 NOTE — Progress Notes (Signed)
CRITICAL VALUE ALERT  Critical Value: Potassium, 2.0  Date & Time Notied:  12/05/18, 0650  Provider Notified: Olevia Bowens  Orders Received/Actions taken:

## 2018-12-06 DIAGNOSIS — T451X5A Adverse effect of antineoplastic and immunosuppressive drugs, initial encounter: Secondary | ICD-10-CM

## 2018-12-06 DIAGNOSIS — D6481 Anemia due to antineoplastic chemotherapy: Secondary | ICD-10-CM

## 2018-12-06 DIAGNOSIS — E860 Dehydration: Secondary | ICD-10-CM

## 2018-12-06 DIAGNOSIS — R1110 Vomiting, unspecified: Secondary | ICD-10-CM

## 2018-12-06 LAB — BASIC METABOLIC PANEL
Anion gap: 7 (ref 5–15)
BUN: 34 mg/dL — ABNORMAL HIGH (ref 8–23)
CO2: 15 mmol/L — ABNORMAL LOW (ref 22–32)
Calcium: 7.5 mg/dL — ABNORMAL LOW (ref 8.9–10.3)
Chloride: 112 mmol/L — ABNORMAL HIGH (ref 98–111)
Creatinine, Ser: 2.49 mg/dL — ABNORMAL HIGH (ref 0.61–1.24)
GFR calc Af Amer: 31 mL/min — ABNORMAL LOW (ref >60)
GFR calc non Af Amer: 27 mL/min — ABNORMAL LOW (ref >60)
Glucose, Bld: 103 mg/dL — ABNORMAL HIGH (ref 70–99)
POTASSIUM: 3.7 mmol/L (ref 3.5–5.1)
Sodium: 134 mmol/L — ABNORMAL LOW (ref 135–145)

## 2018-12-06 LAB — GLUCOSE, CAPILLARY
Glucose-Capillary: 151 mg/dL — ABNORMAL HIGH (ref 70–99)
Glucose-Capillary: 144 mg/dL — ABNORMAL HIGH (ref 70–99)

## 2018-12-06 LAB — MAGNESIUM: Magnesium: 1.7 mg/dL (ref 1.7–2.4)

## 2018-12-06 MED ORDER — MIRTAZAPINE 7.5 MG PO TABS: 7.5000 mg | ORAL_TABLET | Freq: Every day | ORAL | Status: AC

## 2018-12-06 MED ORDER — POTASSIUM CHLORIDE ER 10 MEQ PO TBCR: 20.0000 meq | EXTENDED_RELEASE_TABLET | Freq: Every day | ORAL | Status: AC

## 2018-12-06 MED ORDER — HYDROMORPHONE HCL 2 MG PO TABS: 4.0000 mg | ORAL_TABLET | Freq: Four times a day (QID) | ORAL | 0 refills | Status: DC | PRN

## 2018-12-06 MED ORDER — CALCIUM CARBONATE 1250 (500 CA) MG PO TABS: 1.0000 | ORAL_TABLET | Freq: Three times a day (TID) | ORAL | Status: AC

## 2018-12-06 MED ORDER — NICOTINE 14 MG/24HR TD PT24: 14.0000 mg | MEDICATED_PATCH | Freq: Every day | TRANSDERMAL | Status: AC

## 2018-12-06 MED ORDER — MAGNESIUM OXIDE 400 (241.3 MG) MG PO TABS: 800.0000 mg | ORAL_TABLET | Freq: Two times a day (BID) | ORAL | Status: AC

## 2018-12-06 MED ORDER — SORBITOL 70 % SOLN: 30.0000 mL | Status: AC | PRN

## 2018-12-06 MED ORDER — POTASSIUM CHLORIDE 10 MEQ/100ML IV SOLN
INTRAVENOUS | Status: AC
  Administered 2018-12-06: 10 meq
  Filled 2018-12-06: qty 100

## 2018-12-06 MED ORDER — HEPARIN SOD (PORK) LOCK FLUSH 100 UNIT/ML IV SOLN
500.0000 [IU] | Freq: Once | INTRAVENOUS | Status: DC
  Filled 2018-12-06: qty 5

## 2018-12-06 MED ORDER — ENSURE ENLIVE PO LIQD: 237.0000 mL | Freq: Four times a day (QID) | ORAL | Status: AC

## 2018-12-06 MED ORDER — GLIPIZIDE 5 MG PO TABS: 2.5000 mg | ORAL_TABLET | Freq: Every day | ORAL | Status: AC

## 2018-12-06 MED ORDER — FENTANYL 50 MCG/HR TD PT72: 1.0000 | MEDICATED_PATCH | TRANSDERMAL | 0 refills | Status: DC

## 2018-12-06 NOTE — Care Management Important Message (Signed)
Important Message  Patient Details  Name: Fred Hernandez MRN: [id number] Date of Birth: 08/08/1956   Medicare Important Message Given:  Yes    Sherald Barge, RN 12/06/2018, 10:34 AM

## 2018-12-06 NOTE — Discharge Summary (Signed)
Physician Discharge Summary  Jaimes Eckert [place] OTL:[id number] DOB: May 06, 1956 DOA: 11/26/2018  PCP: Vidal Schwalbe, MD  Admit date: 11/26/2018 Discharge date: 12/06/2018  Time spent: 35 minutes  Recommendations for Outpatient Follow-up:  1. Repeat basic metabolic panel in 5 days to follow electrolytes and renal function 2. Reassess blood pressure and adjust antihypertensive regimen as needed 3. Close follow-up CBGs with further adjustment to hypoglycemic agents as required.   Discharge Diagnoses:  Principal Problem:   Acute renal failure (ARF) (HCC) Active Problems:   Essential hypertension   Diabetes mellitus with circulatory complication (HCC)   Tobacco abuse   Squamous cell carcinoma of right lung (HCC)   Anemia associated with chemotherapy   Palliative care by specialist   DNR (do not resuscitate) discussion   Malnutrition of moderate degree   Dehydration   Non-intractable vomiting   Discharge Condition: Stable and improved.  Electrolytes within normal limits at discharge creatinine of 2.4.  Patient discharged to skilled nursing facility for further care, conditioning and rehabilitation.  Diet recommendation: Modified carbohydrates and heart healthy diet.  Filed Weights   11/26/18 1337 11/26/18 2057  Weight: 47 kg 49.3 kg    Brief History of present illness:  As per H&P written by Dr. Lorin Mercy on 11/26/2018 63 y.o. 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  He came to the ER because he couldn't pee.  He hasn't been able to eat and drink.  He peed once today.  His girlfriend has been getting on him - he has been declining in his PO intake x 3-4 weeks.  He just suddenly didn't feel like eating - despite previously eating 4 times a day.  He was 148 in 2/19; he was 109 the last time he was weighed at chemo.  +tobacco - 1/2 ppd.  Severe  anorexia, no nausea.  He doesn't like the Marinol because it makes him feel high and so he won't take them.  He doesn't have difficulty with urination other than small and infrequent voids.  No fever.  Mild cough.   ED Course:  Decreased PO since Friday - not eating/drinking.  Creatinine 1.28 -> 9.31.  Bladder scan with 100 cc.  He received 2L IVFs.  Hospital Course:  Acute renal failure-improvingand likely secondary to ATN -Creatinineyesterday was5.03 and is now 3.54 -Likelydue to ATN with Zometa usealong with dehydration and NSAIDs -renal US also demonstrated distended bladder, but no hydronephrosis. -Continue followingrenal function panel -Continue monitoring adequate intake and output.  -Appreciate nephrology assistance -Cr down to 2.49 now -Repeat basic metabolic panel in the next 5 days to reassess creatinine/renal function trend.  Lung CA/with chronic pain -NSCLC diagnosed in 2/19 -Continuefentanyl patch (50 mcg) as well as Dilaudid 2 mg PO q2h. -Continue [facility] mouthwash forthrush -will hold chemotherapy while inpatient -tobacco cessation encouraged -Appreciate palliative care discussions for goals of care as patient is now partial code: He will like to start CPR, receive medications as per ACLS need but declined intubation.    Cachexia-severe protein calorie malnutrition-improving -Patient does appear to have some improvement in appetite today.   -continue Remeron.  HTN -Continue Toprol XLat increased dose and the rest of current antihypertensive agents. -BP better control. -Heart healthy diet recommended.  DM -Continue following CBGs fluctuation and further adjust hypoglycemic regimen as needed. -Given elevated creatinine at discharge metformin has been discontinued -  Patient will use low-dose glipizide on daily basis -Modified carbohydrate diet has been encouraged.  Anemia -stable; and associated with chronic renal failure. -willrecommend to follow  trend intermittently  Tobacco dependence -cessation counseling provided. -Continue the use of nicotine patch  Hypocalcemia-persistent, but improved -Continue oral supplementation as recommended by nephrology service. -Noted to have elevated parathyroid hormone levelsand calcitriol started along with calcium carbonate. At discharge will continue just [place].  Hypokalemia-persistent -repleted and WNL at discharge -will continue daily maintenance -repeat BMET in 5 days to follow trend    Procedures:  See below for x-ray reports.  Consultations:  Nephrology service.  Palliative care  Discharge Exam: Vitals:   12/05/18 2305 12/06/18 0547  BP: (!) 93/59 91/68  Pulse: (!) 104 (!) 119  Resp: 20 18  Temp: 99.6 F (37.6 C) 100 F (37.8 C)  SpO2: 99% 97%   General exam: Alert, awake, oriented x 3; no fever, denies CP and SOB. Reported appetite is better. Patient chronically ill in appearance, feeling weak and deconditioned. In no acute distress. Respiratory system: Clear to auscultation. Respiratory effort normal. Cardiovascular system:RRR. No murmurs, rubs, gallops. Gastrointestinal system: Abdomen is nondistended, soft and nontender. No organomegaly or masses felt. Normal bowel sounds heard. Central nervous system: Alert and oriented. No focal neurological deficits. Extremities: No C/C/E, +pedal pulses Skin: No rashes, lesions or ulcers Psychiatry: Judgement and insight appear normal. Mood & affect appropriate.    Discharge Instructions   Discharge Instructions    Discharge instructions   Complete by:  As directed    Take medications as prescribed Maintain adequate hydration Follow-up with oncology service before resuming further chemotherapy treatments. Stop smoking Increase oral intake and take all feeding supplements to assist with protein calorie malnutrition.   Increase activity slowly   Complete by:  As directed      Allergies as of 12/06/2018       Reactions   Lisinopril Anaphylaxis   Penicillins Anaphylaxis   Has patient had a PCN reaction causing immediate rash, facial/tongue/throat swelling, SOB or lightheadedness with hypotension:Yes Has patient had a PCN reaction causing severe rash involving mucus membranes or skin necrosis:unsure Has patient had a PCN reaction that required hospitalization:unsure Has patient had a PCN reaction occurring within the last 10 years:~10 years per patient If all of the above answers are "NO", then may proceed with Cephalosporin use.   Ace Inhibitors Swelling      Medication List    STOP taking these medications   doxycycline 100 MG capsule Commonly known as:  VIBRAMYCIN   metFORMIN 500 MG tablet Commonly known as:  GLUCOPHAGE   predniSONE 20 MG tablet Commonly known as:  DELTASONE     TAKE these medications   aspirin 81 MG chewable tablet Chew 81 mg by mouth every morning.   atorvastatin 80 MG tablet Commonly known as:  LIPITOR Take 80 mg by mouth at bedtime.   calcium carbonate 1250 (500 Ca) MG tablet Commonly known as:  OS-CAL - dosed in mg of elemental calcium Take 1 tablet (500 mg of elemental calcium total) by mouth 3 (three) times daily with meals.   CARBOPLATIN IV Inject into the vein every 21 ( twenty-one) days.   cilostazol 50 MG tablet Commonly known as:  PLETAL TAKE 1 TABLET(50 MG) BY MOUTH TWICE DAILY What changed:  See the new instructions.   Coenzyme Q10 100 MG capsule Take 100 mg by mouth at bedtime.   feeding supplement (ENSURE ENLIVE) Liqd Take 237 mLs by mouth 4 (  four) times daily.   fentaNYL 50 MCG/HR Commonly known as:  [place] 1 patch onto the skin every 3 (three) days. What changed:  how much to take   FIRST-DUKES MOUTHWASH Susp Use as directed 5 mLs in the mouth or throat 4 (four) times daily -  before meals and at bedtime.   Fish Oil 1000 MG Caps Take 1 capsule by mouth at bedtime.   fluticasone 50 MCG/ACT nasal spray Commonly known  as:  FLONASE Place 1 spray into both nostrils every evening.   GEMZAR IV Inject into the vein. Day 1 day 8 every 21 days.   glipiZIDE 5 MG tablet Commonly known as:  GLUCOTROL Take 0.5 tablets (2.5 mg total) by mouth daily.   HYDROmorphone 2 MG tablet Commonly known as:  DILAUDID Take 2 tablets (4 mg total) by mouth every 6 (six) hours as needed (break through pain). What changed:    when to take this  reasons to take this   ipratropium-albuterol 0.5-2.5 (3) MG/3ML Soln Commonly known as:  DUONEB Take 3 mLs by nebulization every 6 (six) hours as needed (for shortness of breath).   lidocaine-prilocaine cream Commonly known as:  EMLA Apply a quarter size amount to affected area 1 hour prior to coming to chemotherapy.  Do not rub in.  Cover with plastic wrap.   magnesium oxide 400 (241.3 Mg) MG tablet Commonly known as:  MAG-OX Take 2 tablets (800 mg total) by mouth 2 (two) times daily.   metoprolol succinate 50 MG 24 hr tablet Commonly known as:  TOPROL-XL Take 50 mg by mouth daily.   mirtazapine 7.5 MG tablet Commonly known as:  REMERON Take 1 tablet (7.5 mg total) by mouth at bedtime.   nicotine 14 mg/24hr patch Commonly known as:  NICODERM CQ - dosed in mg/24 hours Place 1 patch (14 mg total) onto the skin daily. Start taking on:  December 07, 2018   ondansetron 8 MG tablet Commonly known as:  ZOFRAN Take 1 tablet (8 mg total) by mouth every 8 (eight) hours as needed for nausea or vomiting.   pantoprazole 40 MG tablet Commonly known as:  PROTONIX Take 40 mg by mouth 2 (two) times daily.   potassium chloride 10 MEQ tablet Commonly known as:  K-DUR Take 2 tablets (20 mEq total) by mouth daily.   PROAIR HFA 108 (90 Base) MCG/ACT inhaler Generic drug:  albuterol Inhale 1-2 puffs into the lungs every 6 (six) hours as needed for wheezing or shortness of breath.   prochlorperazine 10 MG tablet Commonly known as:  COMPAZINE Take 1 tablet (10 mg total) by mouth  every 6 (six) hours as needed for nausea or vomiting.   sorbitol 70 % Soln Take 30 mLs by mouth as needed for moderate constipation.      Allergies  Allergen Reactions  . Lisinopril Anaphylaxis  . Penicillins Anaphylaxis    Has patient had a PCN reaction causing immediate rash, facial/tongue/throat swelling, SOB or lightheadedness with hypotension:Yes Has patient had a PCN reaction causing severe rash involving mucus membranes or skin necrosis:unsure Has patient had a PCN reaction that required hospitalization:unsure Has patient had a PCN reaction occurring within the last 10 years:~10 years per patient If all of the above answers are "NO", then may proceed with Cephalosporin use.   Fred Hernandez Inhibitors Swelling    Contact information for follow-up providers    Vidal Schwalbe, MD. Schedule an appointment as soon as possible for a visit in 10 day(s).  Specialty:  Family Medicine Why:  After discharge from the skilled nursing facility Contact information: [address] [place] [address] [phone]        [name], MD .   Specialty:  Cardiology Contact information: [place] [address] [phone]            Contact information for after-discharge care    [facility] .   Service:  Skilled Nursing Contact information: [place] [phone]                  The results of significant diagnostics from this hospitalization (including imaging, microbiology, ancillary and laboratory) are listed below for reference.    Significant Diagnostic Studies: Dg Chest 2 View  Result Date: 11/26/2018 CLINICAL DATA:  Shortness of breath, weakness, history prostate cancer, lung cancer EXAM: CHEST TWO VIEWS COMPARISON:  Chest radiograph 02/19/2018, PET-CT 08/30/2018 FINDINGS: LEFT subclavian Port-A-Cath with tip projecting over SVC. Normal heart size, mediastinal contours,  and pulmonary vascularity. Chronic pleuroparenchymal opacity at the RIGHT apex associated with a cavitary focus versus loculated pneumothorax unchanged from PET-CT. Bone destruction of the lateral RIGHT second and posterior RIGHT third ribs again identified. Underlying emphysematous changes. Question LEFT nipple shadow. No definite acute infiltrate, pleural effusion or pneumothorax. IMPRESSION: Known tumor at RIGHT apex with bone destruction of the RIGHT second and third ribs as well as a chronic cavitary focus versus loculated pneumothorax at RIGHT apex. Emphysematous changes with question LEFT nipple shadow; repeat PA chest radiograph with nipple markers recommended to exclude pulmonary nodule. Electronically Signed   By: Lavonia Dana M.D.   On: 11/26/2018 15:14   US Renal  Result Date: 11/27/2018 CLINICAL DATA:  Lung cancer with difficulty urinating EXAM: RENAL / URINARY TRACT ULTRASOUND COMPLETE COMPARISON:  PET-CT 08/30/2018 FINDINGS: Right Kidney: Renal measurements: 12.7 x 5.7 x 6.4 cm = volume: 242 mL. Increased cortical echogenicity. No hydronephrosis or mass. Left Kidney: Renal measurements: 12.3 x 6.3 x 5.9 cm = volume: 240 mL. Increased cortical echogenicity. No hydronephrosis. Bladder: Physiologic distension of the bladder. Trace pelvic fluid. No bladder wall thickening. IMPRESSION: 1. Moderate distension of the bladder without focal abnormality. 2. Medical renal disease. Electronically Signed   By: Monte Fantasia M.D.   On: 11/27/2018 10:02   Dg Chest 1v Repeat Same Day  Result Date: 11/26/2018 CLINICAL DATA:  Possible lung nodule versus nipple shadow. EXAM: CHEST - 1 VIEW SAME DAY COMPARISON:  Earlier same day FINDINGS: 2004 hours. Repeat frontal radiograph with nipple marker confirms that the nodular density overlying the posterior left eighth rib on the prior study represents a nipple shadow. Right apical lesion again noted with destruction of the second and third ribs. Left Port-A-Cath  stable. The cardiopericardial silhouette is within normal limits for size. IMPRESSION: Nodular density seen at the left base on chest x-ray earlier today represented the patient's left nipple. No lung nodule in the left lower lung. Electronically Signed   By: Misty Stanley M.D.   On: 11/26/2018 20:31    Microbiology: Recent Results (from the past 240 hour(s))  Urine culture     Status: None   Collection Time: 11/26/18  2:09 PM  Result Value Ref Range Status   Specimen Description   Final    URINE, CATHETERIZED Performed at [facility], [address], [place], [place] [address]    Special Requests   Final    Normal  Performed at [facility], [address], [place], [place] [address]    Culture   Final    NO GROWTH Performed at [facility], [address] [place], [place] [address]    Report Status 11/29/2018 FINAL  Final     Labs: Basic Metabolic Panel: Recent Labs  Lab 11/30/18 0508  12/01/18 0534  12/02/18 0820  12/03/18 0540 12/03/18 1648 12/04/18 0607 12/05/18 0536 12/06/18 0526  NA 130*   < > 131*   < > 136   < > 138 137 136 134* 134*  K 3.2*   < > 3.8   < > 3.0*   < > 2.5* 3.0* 2.3* 2.0* 3.7  CL 85*   < > 93*   < > 100   < > 108 108 109 107 112*  CO2 26   < > 23   < > 20*   < > 18* 18* 16* 16* 15*  GLUCOSE 97   < > 100*   < > 98   < > 150* 114* 116* 133* 103*  BUN 103*   < > 91*   < > 73*   < > 61* 52* 44* 32* 34*  CREATININE 8.20*   < > 7.35*   < > 5.91*   < > 5.03* 4.33* 3.54* 2.55* 2.49*  CALCIUM 5.9*   < > 6.4*   < > 6.1*   < > 6.5* 7.0* 6.9* 7.1* 7.5*  MG 1.9  --  1.8  --   --   --  1.4*  --  2.0 1.9 1.7  PHOS 4.1  --   --   --  2.9  --  3.4  --  2.2*  --   --    < > = values in this interval not displayed.   Liver Function Tests: Recent Labs  Lab 11/30/18 0508 12/02/18 0820 12/03/18 0540 12/04/18 0607  ALBUMIN 2.1* 2.2* 2.2* 2.2*   CBG: Recent Labs  Lab 12/05/18 0810 12/05/18 1102 12/05/18 1624 12/05/18 2307 12/06/18 0802  GLUCAP  137* 121* 90 98 151*    Signed:  Barton Dubois MD.  [facility] 12/06/2018, 10:33 AM

## 2018-12-06 NOTE — Clinical Social Work Placement (Signed)
   CLINICAL SOCIAL WORK PLACEMENT  NOTE  Date:  12/06/2018  Patient Details  Name: Fred Hernandez MRN: [id number] Date of Birth: 09/09/1956  Clinical Social Work is seeking post-discharge placement for this patient at the [place] level of care (*CSW will initial, date and re-position this form in  chart as items are completed):  Yes   Patient/family provided with [facility] Work Department's list of facilities offering this level of care within the geographic area requested by the patient (or if unable, by the patient's family).  Yes   Patient/family informed of their freedom to choose among providers that offer the needed level of care, that participate in Medicare, Medicaid or managed care program needed by the patient, have an available bed and are willing to accept the patient.  No   Patient/family informed of [facility]'s ownership interest in [facility] and [facility], as well as of the fact that they are under no obligation to receive care at these facilities.  PASRR submitted to EDS on       PASRR number received on       Existing PASRR number confirmed on       FL2 transmitted to all facilities in geographic area requested by pt/family on 12/03/18     FL2 transmitted to all facilities within larger geographic area on       Patient informed that his/her managed care company has contracts with or will negotiate with certain facilities, including the following:        Yes   Patient/family informed of bed offers received.  Patient [facility] bed at [facility]     Physician recommends and patient chooses bed at      Patient to be transferred to [facility] on 12/06/18.  Patient to be transferred to facility by family     Patient family notified on 12/06/18 of transfer.  Name of family member notified:  SO, multiple family members, all bedside     PHYSICIAN       Additional Comment:   Based on PT observation this AM, pt able to be transported by car.  Family is willing to transport.  Alerted nursing of plan.  Call report to [phone].  CSW sign off   _______________________________________________ Trish Mage, LCSW 12/06/2018, 10:56 AM

## 2018-12-06 NOTE — Progress Notes (Signed)
Physical Therapy Treatment Patient Details Name: Fred Hernandez MRN: [id number] DOB: [birth year] Today's Date: 12/06/2018    History of Present Illness Fred Hernandez is a 63 y.o. 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  He came to the ER because he couldn't pee.  He hasn't been able to eat and drink.  He peed once today.  His girlfriend has been getting on him - he has been declining in his PO intake x 3-4 weeks.  He just suddenly didn't feel like eating - despite previously eating 4 times a day.  He was 148 in 2/19; he was 109 the last time he was weighed at chemo.  +tobacco - 1/2 ppd.  Severe anorexia, no nausea.  He doesn't like the Marinol because it makes him feel high and so he won't take them.  He doesn't have difficulty with urination other than small and infrequent voids.  No fever.  Mild cough.    PT Comments    Patient mentation at baseline and cooperative with therapy without c/o pain.  Patient demonstrates good tolerance for sitting up at bedside while performing BLE ROM/strengthening exercises, slow slightly labored cadence without loss of balance during gait training in hallways and tolerated sitting up in chair after therapy.  Patient will benefit from continued physical therapy in hospital and recommended venue below to increase strength, balance, endurance for safe ADLs and gait.    Follow Up Recommendations  SNF     Equipment Recommendations  Rolling walker with 5" wheels    Recommendations for Other Services       Precautions / Restrictions Precautions Precautions: Fall Restrictions Weight Bearing Restrictions: No    Mobility  Bed Mobility Overal bed mobility: Needs Assistance Bed Mobility: Supine to Sit     Supine to sit: Supervision     General bed mobility comments: increased time  Transfers Overall  transfer level: Needs assistance Equipment used: Rolling walker (2 wheeled) Transfers: Sit to/from [facility] Sit to Stand: Min guard Stand pivot transfers: Min guard       General transfer comment: slightly labored, increased time  Ambulation/Gait Ambulation/Gait assistance: Min guard Gait Distance (Feet): 100 Feet Assistive device: Rolling walker (2 wheeled) Gait Pattern/deviations: Decreased step length - right;Decreased step length - left;Decreased stride length Gait velocity: decreased   General Gait Details: slightly labored slow cadence without loss of balance, occasional bumping into nearby objects, limited secondary to c/o fatigue   Stairs             Wheelchair Mobility    Modified Rankin (Stroke Patients Only)       Balance Overall balance assessment: Needs assistance Sitting-balance support: Feet supported;No upper extremity supported Sitting balance-Leahy Scale: Good     Standing balance support: No upper extremity supported;During functional activity Standing balance-Leahy Scale: Fair Standing balance comment: fair/good with RW                            Cognition Arousal/Alertness: Awake/alert Behavior During Therapy: WFL for tasks assessed/performed Overall Cognitive Status: Within Functional Limits for tasks assessed  Exercises General Exercises - Lower Extremity Long Arc Quad: Seated;AROM;Strengthening;Both;15 reps Hip Flexion/Marching: Seated;AROM;Strengthening;Both;15 reps Toe Raises: Seated;AROM;Strengthening;Both;15 reps Heel Raises: Seated;AROM;Strengthening;Both;15 reps    General Comments        Pertinent Vitals/Pain Pain Assessment: No/denies pain    Home Living                      Prior Function            PT Goals (current goals can now be found in the care plan section) Acute Rehab PT Goals Patient Stated Goal: return home  with family/girl friend to assist PT Goal Formulation: With patient/family Time For Goal Achievement: 12/07/18 Potential to Achieve Goals: Good Progress towards PT goals: Progressing toward goals    Frequency    Min 3X/week      PT Plan Current plan remains appropriate    Co-evaluation              AM-PAC PT "6 Clicks" Mobility   Outcome Measure  Help needed turning from your back to your side while in a flat bed without using bedrails?: None Help needed moving from lying on your back to sitting on the side of a flat bed without using bedrails?: None Help needed moving to and from a bed to a chair (including a wheelchair)?: A Little Help needed standing up from a chair using your arms (e.g., wheelchair or bedside chair)?: A Little Help needed to walk in hospital room?: A Little Help needed climbing 3-5 steps with a railing? : A Lot 6 Click Score: 19    End of Session   Activity Tolerance: Patient tolerated treatment well;Patient limited by fatigue Patient left: in chair;with call bell/phone within reach;with family/visitor present Nurse Communication: Mobility status PT Visit Diagnosis: Unsteadiness on feet (R26.81);Muscle weakness (generalized) (M62.81)     Time: 0920-0950 PT Time Calculation (min) (ACUTE ONLY): 30 min  Charges:  $Gait Training: 8-22 mins $Therapeutic Exercise: 8-22 mins                     12:17 PM, 12/06/18 [name], MPT Physical Therapist with [facility] [phone] office [phone] mobile phone

## 2018-12-07 ENCOUNTER — Ambulatory Visit (HOSPITAL_COMMUNITY): Payer: Medicare Other

## 2018-12-07 ENCOUNTER — Other Ambulatory Visit (HOSPITAL_COMMUNITY): Payer: Medicare Other

## 2018-12-07 ENCOUNTER — Ambulatory Visit (HOSPITAL_COMMUNITY): Payer: Medicare Other | Admitting: Hematology

## 2018-12-14 ENCOUNTER — Ambulatory Visit (HOSPITAL_COMMUNITY): Payer: Medicare Other | Admitting: Hematology

## 2018-12-14 ENCOUNTER — Ambulatory Visit (HOSPITAL_COMMUNITY): Payer: Medicare Other

## 2018-12-14 ENCOUNTER — Other Ambulatory Visit (HOSPITAL_COMMUNITY): Payer: Medicare Other

## 2018-12-26 ENCOUNTER — Telehealth: Payer: Self-pay | Admitting: *Deleted

## 2018-12-26 NOTE — Telephone Encounter (Signed)
Fred Hernandez from [facility] rehab says since 12/06/2018 pt BP has been 90s/50s-70s with HR 104-140s - not currently taking Toprol XL or Pletal - rehab doesn't know when or why this was stopped - wanted to know Dr Court Joy recs on how to treat low BP and elevated HR - has f/u in April with Dr Bronson Ing

## 2018-12-26 NOTE — Telephone Encounter (Signed)
Spoke with Gabriel Cirri and scheduled pt for 3/17 at 130pm - aware appt is in [place] office

## 2018-12-26 NOTE — Telephone Encounter (Signed)
He was recently hospitalized for acute renal failure with severe protein malnutrition.  I do not feel comfortable adjusting medications over the phone given his hypotension and tachycardia.  I would have him see an extender at any office.

## 2018-12-31 ENCOUNTER — Other Ambulatory Visit (HOSPITAL_COMMUNITY): Payer: Medicare Other

## 2019-01-01 ENCOUNTER — Other Ambulatory Visit (HOSPITAL_COMMUNITY): Payer: Self-pay | Admitting: *Deleted

## 2019-01-01 ENCOUNTER — Inpatient Hospital Stay (HOSPITAL_COMMUNITY): Payer: Medicare Other | Attending: Hematology | Admitting: Dietician

## 2019-01-01 ENCOUNTER — Other Ambulatory Visit (HOSPITAL_COMMUNITY): Payer: Self-pay | Admitting: Nurse Practitioner

## 2019-01-01 DIAGNOSIS — C7951 Secondary malignant neoplasm of bone: Secondary | ICD-10-CM | POA: Insufficient documentation

## 2019-01-01 DIAGNOSIS — Z9221 Personal history of antineoplastic chemotherapy: Secondary | ICD-10-CM | POA: Insufficient documentation

## 2019-01-01 DIAGNOSIS — I2510 Atherosclerotic heart disease of native coronary artery without angina pectoris: Secondary | ICD-10-CM | POA: Insufficient documentation

## 2019-01-01 DIAGNOSIS — I10 Essential (primary) hypertension: Secondary | ICD-10-CM | POA: Insufficient documentation

## 2019-01-01 DIAGNOSIS — K219 Gastro-esophageal reflux disease without esophagitis: Secondary | ICD-10-CM | POA: Insufficient documentation

## 2019-01-01 DIAGNOSIS — F17210 Nicotine dependence, cigarettes, uncomplicated: Secondary | ICD-10-CM | POA: Insufficient documentation

## 2019-01-01 DIAGNOSIS — E119 Type 2 diabetes mellitus without complications: Secondary | ICD-10-CM | POA: Insufficient documentation

## 2019-01-01 DIAGNOSIS — Z923 Personal history of irradiation: Secondary | ICD-10-CM | POA: Insufficient documentation

## 2019-01-01 DIAGNOSIS — E785 Hyperlipidemia, unspecified: Secondary | ICD-10-CM | POA: Insufficient documentation

## 2019-01-01 DIAGNOSIS — C3411 Malignant neoplasm of upper lobe, right bronchus or lung: Secondary | ICD-10-CM | POA: Insufficient documentation

## 2019-01-01 DIAGNOSIS — Z7982 Long term (current) use of aspirin: Secondary | ICD-10-CM | POA: Insufficient documentation

## 2019-01-01 DIAGNOSIS — G893 Neoplasm related pain (acute) (chronic): Secondary | ICD-10-CM | POA: Insufficient documentation

## 2019-01-01 DIAGNOSIS — J449 Chronic obstructive pulmonary disease, unspecified: Secondary | ICD-10-CM | POA: Insufficient documentation

## 2019-01-01 DIAGNOSIS — Z79899 Other long term (current) drug therapy: Secondary | ICD-10-CM | POA: Insufficient documentation

## 2019-01-01 DIAGNOSIS — I739 Peripheral vascular disease, unspecified: Secondary | ICD-10-CM | POA: Insufficient documentation

## 2019-01-01 DIAGNOSIS — C3491 Malignant neoplasm of unspecified part of right bronchus or lung: Secondary | ICD-10-CM

## 2019-01-01 DIAGNOSIS — C61 Malignant neoplasm of prostate: Secondary | ICD-10-CM | POA: Insufficient documentation

## 2019-01-01 DIAGNOSIS — L89159 Pressure ulcer of sacral region, unspecified stage: Secondary | ICD-10-CM | POA: Insufficient documentation

## 2019-01-01 DIAGNOSIS — R630 Anorexia: Secondary | ICD-10-CM | POA: Insufficient documentation

## 2019-01-01 MED ORDER — FIRST-DUKES MOUTHWASH MT SUSP: 5.0000 mL | Freq: Three times a day (TID) | OROMUCOSAL | 1 refills | Status: AC

## 2019-01-01 NOTE — Progress Notes (Signed)
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 Has been undergoing palliative chemoradiation up until recent hospitalization 1/27-2/6 for acute renal failure and hypocalcemia. He was discharged to SNF for rehab. He is to see MD for first time since his hospitalization on 3/5 and discuss future care.   SPouse had requested to meet with RD today, largely because they benefit greatly from [employer]/supplement assistance.   Per spouse, pt got out of rehab this past Saturday. He did not return back to his home and been living with his sister since.  RD asked about how he did at SNF and his current functional status. He is seen in a wheelchair, but apparently can walk very short distances; he was discharged from SNF with a rolling walker. Spouse says therapy was greatly hindered by pts tachycardia; w/ his HR getting >140 with minimal effort.   Regarding intake, spouse says the pt was eating fair at SNF. He would "ateleast eat something" at each meal. He was not fond of the food there so family frequently would bring food items in: beef tips, tv dinners, chicken noodle soup. He continued with oral supplements while there and was drinking 3 each day.   Wt wise, spouse says the last weight measurement taken at SNF had patient weighing 104 lbs. This is same as the admission weight from his hospitalization in late January-from which he was discharged to the SNF. Spouse says pt had gotten to as low as 101 lbs while at SNF, but had regained. He is down 35 lbs x 5 months (25% bw)  RD asked nursing if they could check vitals today given his appearance. Pulse was 139, but temp was 98.4 and BP was un concerning.   The pt himself only voices 2 complaints. One is his persistent lack of appetite and the other is a small, painful wound on his sacrum. Apparently the [facility] nurse had assessed this at SNF and was not impressed. Spouse  says it is very small, scabbed over and stable to slowly improving. RD noted he would pass this concern on to NP. Pt denies any n/v/d. He has constipation, but this is well managed w/ softeners.    Wt Readings from Last 10 Encounters:  01/01/19 104 lb (47.2 kg)  11/26/18 108 lb 11 oz (49.3 kg)  11/16/18 104 lb 9.6 oz (47.4 kg)  11/09/18 109 lb 9.6 oz (49.7 kg)  10/26/18 114 lb 14.4 oz (52.1 kg)  10/19/18 119 lb 6.4 oz (54.2 kg)  10/04/18 122 lb 6.4 oz (55.5 kg)  09/19/18 130 lb (59 kg)  09/05/18 134 lb (60.8 kg)  08/22/18 139 lb 12.8 oz (63.4 kg)   MEDICATIONS:  Chemo: treatment on hold Supportive: Fentanyl, Dukes mouth wash, dilaudid, Remeron, compazine, ppi, zofran,  Other: Omega 3s, Calc carb, mag ox, glipizide, kcl,   LABS:  None in >1 month  ANTHROPOMETRICS: Height:  Ht Readings from Last 1 Encounters:  11/26/18 5\' 9"  (1.753 m)   Weight:  Wt Readings from Last 1 Encounters:  01/01/19 104 lb (47.2 kg)   BMI:  BMI Readings from Last 1 Encounters:  01/01/19 15.36 kg/m   IBW: 72.73 kg Wt changes: He is down 35 lbs x 5 months (25% bw) - was 139.8 end of Oct  ESTIMATED ENERGY NEEDS:  Kcal: >1900 kcals (40 kcal/kg) Protein: >94g Pro (2g/kg bw  Fluid:  1.9 Liters fluid (39ml/kcal)  NUTRITION DIAGNOSIS:  Severe malnutrition r/t cancer and cancer related treatments AEB severe muscle/fat loss  DOCUMENTATION CODES:  Severe mal, chronic  INTERVENTION:  Pt has exhausted all non-aggressive nutrition interventions (education, supplements, appetite stimulations, optimization of supportive medications etc). Only aggressive interventions remain, such as PEG. However, this is not felt appropriate as he is believed to be exhibiting cancer cachexia and is likely unable to even gain weight at this point.   RD provided pt/family with a large number of oral supplements and coupons. RD also discussed pts tachycardia and wound with NP.   Pt will be seen by MD Thursday. Will monitor  outcome of that discussion, specifically whether or not patient will be candidate for any further treatment.   GOAL:  Oral intake to meet >90% of pro/kcal needs, wt stability.   MONITOR:  Oral intake, weight, labs, treatment plan   Next Visit: TBD  Burtis Junes RD, LDN, CNSC Clinical Nutrition Available Tues-Sat via Pager: [phone] 01/01/2019 1:00 PM

## 2019-01-01 NOTE — Telephone Encounter (Signed)
Patient's fiance called clinic stating that she forgot to mention to the dietician today that he has sores along his gum line. She wanted a refill on the magic mouth wash.  I sent in refills for patient on this medication.

## 2019-01-03 ENCOUNTER — Other Ambulatory Visit: Payer: Self-pay

## 2019-01-03 ENCOUNTER — Inpatient Hospital Stay (HOSPITAL_BASED_OUTPATIENT_CLINIC_OR_DEPARTMENT_OTHER): Payer: Medicare Other | Admitting: Hematology

## 2019-01-03 ENCOUNTER — Encounter (HOSPITAL_COMMUNITY): Payer: Self-pay | Admitting: Hematology

## 2019-01-03 VITALS — BP 92/57 | HR 139 | Temp 98.3°F | Resp 18 | Wt 104.9 lb

## 2019-01-03 DIAGNOSIS — C3491 Malignant neoplasm of unspecified part of right bronchus or lung: Secondary | ICD-10-CM

## 2019-01-03 DIAGNOSIS — Z923 Personal history of irradiation: Secondary | ICD-10-CM | POA: Diagnosis not present

## 2019-01-03 DIAGNOSIS — Z9221 Personal history of antineoplastic chemotherapy: Secondary | ICD-10-CM

## 2019-01-03 DIAGNOSIS — C3411 Malignant neoplasm of upper lobe, right bronchus or lung: Secondary | ICD-10-CM | POA: Diagnosis not present

## 2019-01-03 DIAGNOSIS — G893 Neoplasm related pain (acute) (chronic): Secondary | ICD-10-CM | POA: Diagnosis not present

## 2019-01-03 DIAGNOSIS — C7951 Secondary malignant neoplasm of bone: Secondary | ICD-10-CM

## 2019-01-03 DIAGNOSIS — Z79899 Other long term (current) drug therapy: Secondary | ICD-10-CM | POA: Diagnosis not present

## 2019-01-03 DIAGNOSIS — L89159 Pressure ulcer of sacral region, unspecified stage: Secondary | ICD-10-CM | POA: Diagnosis not present

## 2019-01-03 DIAGNOSIS — I2510 Atherosclerotic heart disease of native coronary artery without angina pectoris: Secondary | ICD-10-CM | POA: Diagnosis not present

## 2019-01-03 DIAGNOSIS — I739 Peripheral vascular disease, unspecified: Secondary | ICD-10-CM

## 2019-01-03 DIAGNOSIS — C61 Malignant neoplasm of prostate: Secondary | ICD-10-CM | POA: Diagnosis not present

## 2019-01-03 DIAGNOSIS — K219 Gastro-esophageal reflux disease without esophagitis: Secondary | ICD-10-CM

## 2019-01-03 DIAGNOSIS — Z7982 Long term (current) use of aspirin: Secondary | ICD-10-CM | POA: Diagnosis not present

## 2019-01-03 DIAGNOSIS — E785 Hyperlipidemia, unspecified: Secondary | ICD-10-CM

## 2019-01-03 DIAGNOSIS — S31000A Unspecified open wound of lower back and pelvis without penetration into retroperitoneum, initial encounter: Secondary | ICD-10-CM

## 2019-01-03 DIAGNOSIS — F17210 Nicotine dependence, cigarettes, uncomplicated: Secondary | ICD-10-CM

## 2019-01-03 DIAGNOSIS — J449 Chronic obstructive pulmonary disease, unspecified: Secondary | ICD-10-CM | POA: Diagnosis not present

## 2019-01-03 DIAGNOSIS — I10 Essential (primary) hypertension: Secondary | ICD-10-CM

## 2019-01-03 DIAGNOSIS — R630 Anorexia: Secondary | ICD-10-CM | POA: Diagnosis not present

## 2019-01-03 DIAGNOSIS — E119 Type 2 diabetes mellitus without complications: Secondary | ICD-10-CM

## 2019-01-03 MED ORDER — CEPHALEXIN 500 MG PO CAPS: 500.0000 mg | ORAL_CAPSULE | Freq: Two times a day (BID) | ORAL | 0 refills | Status: AC

## 2019-01-03 NOTE — Assessment & Plan Note (Signed)
1.  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 -CT scan on 04/19/2018 showed regression of the tumor which is sub-solid in appearance with no new areas.  - Consolidation durvalumab started on 05/03/2018 until 09/19/2018 with CT scan on 07/31/2018 showing slight interval worsening of destruction of the posterior lateral right second and third ribs.  There is also slight worsening by few millimeters of right apical lung mass. - PET CT scan on 08/30/2018 shows peripheral right apical tumor with rib and chest wall invasion, progressive compared to prior PET scan, with increased chest wall invasion.  Right paratracheal/mediastinal lymph nodes demonstrate increased hypermetabolism.  - 1 dose of palliative radiation on 10/16/2018  -Cycle 1 of gemcitabine and carboplatin on 10/19/2018. -Cycle 2 of gemcitabine and carboplatin on 11/09/2018.  He missed day 8 of gemcitabine. - He was hospitalized from 11/26/2018 through 12/06/2018 with acute renal failure.  He was subsequently discharged to rehab.  He is now living with his sister for the past 1 week.  His last creatinine was 2.5. - He is too weak to receive any further chemotherapy.  I have ordered restaging PET CT scan. - He has developed decubitus ulcer on the left buttock cheek, with pain and induration.  Is also putting out some pus. - I have given a prescription for Keflex 500 mg twice daily.  I have made a referral to 1 of our surgeons. -I will see him back after the PET CT scan.  If there is any significant worsening we will consider palliative/hospice consult.  2.  Recurrent prostate cancer: He had a history of prostate cancer and reportedly received radiation therapy 6-7 years ago.  He had prostate biopsies on 01/17/2018 which showed recurrence with a Gleason score (5+5)=10.  He will be treated with GnRH agonist.  3.  Cancer related pain: -He has pain in  the spine and between his shoulder blades. -He is wearing 50 mcg fentanyl patch.  He is also taking Dilaudid 2 mg 2-3 times per day.

## 2019-01-03 NOTE — Patient Instructions (Addendum)
[place] [facility] Discharge Instructions  You were seen today by Dr. Delton Coombes, he went over how you've been feeling and you're pain. He checked the wound on your bottom, Dr. Raliegh Ip wants you to be evaluated by a surgeon for this. Your kidney labs are still elevated. We will schedule a PET scan and check on the cancer. We have sent in an antibiotic for you to take for your bottom.    Thank you for choosing [facility] [facility] to provide your oncology and hematology care.  To afford each patient quality time with our provider, please arrive at least 15 minutes before your scheduled appointment time.   If you have a lab appointment with the [facility] please come in thru the  Main Entrance and check in at the main information desk  You need to re-schedule your appointment should you arrive 10 or more minutes late.  We strive to give you quality time with our providers, and arriving late affects you and other patients whose appointments are after yours.  Also, if you no show three or more times for appointments you may be dismissed from the clinic at the providers discretion.     Again, thank you for choosing [facility].  Our hope is that these requests will decrease the amount of time that you wait before being seen by our physicians.       _____________________________________________________________  Should you have questions after your visit to [facility], please contact our office at [phone] between the hours of 8:00 a.m. and 4:30 p.m.  Voicemails left after 4:00 p.m. will not be returned until the following business day.  For prescription refill requests, have your pharmacy contact our office and allow 72 hours.    Cancer Center Support Programs:   > Cancer Support Group  2nd Tuesday of the month 1pm-2pm, Journey Room

## 2019-01-03 NOTE — Progress Notes (Signed)
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 There is early bony invasion of the right second rib and cortical destruction indicating local rib/chest wall invasion by tumor. 2. There is additional pleural thickening and pleural calcification along the right posterior pleural surface which is not separately hypermetabolic. 3. Small mediastinal lymph nodes are not appreciably hypermetabolic. 4. Multifocal and some diffuse regions of high activity in the bowel are likely physiologic. 5. No findings of metastatic disease to the neck, abdomen/pelvis, or to the included remainder of the skeleton. 6. Other imaging findings of potential clinical significance: Chronic right maxillary sinusitis. Aortic Atherosclerosis (ICD10-I70.0) and Emphysema (ICD10-J43.9). Chronic calcific pancreatitis.     01/15/2018 Initial Diagnosis    Squamous cell carcinoma of right lung ([place])    01/15/2018 Initial Biopsy    Lung needle core biopsy RUL: Squamous cell carcinoma    02/19/2018 Procedure    Port-a-cath placement by Dr. Arnoldo Morale     02/20/2018 -  Chemotherapy    The patient had palonosetron (ALOXI) injection 0.25 mg, 0.25 mg, Intravenous,  Once, 1 of 4 cycles Administration: 0.25 mg (02/20/2018) CARBOplatin (PARAPLATIN) 230 mg in sodium chloride 0.9 % 250 mL chemo infusion, 230 mg (100 % of original dose 225.8 mg), Intravenous,  Once, 1 of 4  cycles Dose modification:   (original dose 225.8 mg, Cycle 1) PACLitaxel (TAXOL) 78 mg in dextrose 5 % 250 mL chemo infusion (</= 80mg /m2), 45 mg/m2 = 78 mg, Intravenous,  Once, 1 of 4 cycles Administration: 78 mg (02/20/2018)  for chemotherapy treatment.     05/06/2018 - 10/03/2018 Chemotherapy    The patient had durvalumab (IMFINZI) 620 mg in sodium chloride 0.9 % 100 mL chemo infusion, 10 mg/kg = 620 mg, Intravenous,  Once, 10 of 12 cycles Administration: 620 mg (05/11/2018), 620 mg (05/25/2018), 620 mg (06/08/2018), 620 mg (06/22/2018), 620 mg (07/06/2018), 620 mg (07/20/2018), 620 mg (08/08/2018), 620 mg (08/22/2018), 620 mg (09/05/2018), 620 mg (09/19/2018)  for chemotherapy treatment.     10/19/2018 -  Chemotherapy    The patient had palonosetron (ALOXI) injection 0.25 mg, 0.25 mg, Intravenous,  Once, 2 of 4 cycles Administration: 0.25 mg (10/19/2018), 0.25 mg (11/09/2018) ondansetron (ZOFRAN) 8 mg in sodium chloride 0.9 % 50 mL IVPB, 8 mg (100 % of original dose 8 mg), Intravenous,  Once, 1 of 3 cycles Dose modification: 8 mg (original dose 8 mg, Cycle 1) Administration: 8 mg (10/26/2018) CARBOplatin (PARAPLATIN) 500 mg in sodium chloride 0.9 % 250 mL chemo infusion, 500 mg (100 % of original dose 501 mg), Intravenous,  Once, 2 of 4 cycles Dose modification:   (original dose 501 mg, Cycle 1),   (original dose 501 mg, Cycle 2) Administration: 500 mg (10/19/2018), 500 mg (11/09/2018) gemcitabine (GEMZAR) 1,634 mg in sodium chloride 0.9 % 250 mL chemo infusion, 1,000 mg/m2 = 1,634 mg, Intravenous,  Once, 2 of 4 cycles Administration: 1,634 mg (10/19/2018), 1,634 mg (10/26/2018), 1,634 mg (11/09/2018)  for chemotherapy treatment.  Adenocarcinoma of prostate ([facility])   10/24/2015 Tumor Marker    PSA 1.53    01/06/2016 Tumor Marker    PSA 1.6    07/31/2017 Tumor Marker    PSA 4.6    01/17/2018 Initial Diagnosis    Adenocarcinoma of prostate ([place])    01/17/2018 Procedure    12-core prostate biopsy  revealed 6/12 cores positive for prostate adenocarcinoma (Dr[facility]).  (L) base lat: Gleason 9 (5+4), 20%. (L) mid lat: Gleason 9 (4+5), 40%. (L) apex lat: Gleason 9 (4+5), 20%. (L) base: Gleason 9 (5+4), 20%. (L) mid: Gleason 10 (5+5), 5%. (L) apex: Gleason 9 (5+4), 9%.         INTERVAL HISTORY:  Mr. Coppola 63 y.o. male returns for routine follow-up for squamous cell carcinoma of the right lung. He is here today with his family. He has felt better and a little stronger since being discharged from rehab. He is still unable to do any activities at home. He sits all day. His only activity is to the bathroom and back. He also has developed a wound on his sacrum on the left side. His appetite is decreased and he is not eating well. He has been living with his sister since he was discharged from rehab. Denies any nausea, vomiting, or diarrhea. Denies any new pains. Had not noticed any recent bleeding such as epistaxis, hematuria or hematochezia. Denies recent chest pain on exertion, shortness of breath on minimal exertion, pre-syncopal episodes, or palpitations. Denies any numbness or tingling in hands or feet. Denies any recent fevers, infections, or recent hospitalizations. Patient reports appetite at 25% and energy level at 0%. He has lost 10 pounds since the end of December. He is seeing our Nutritionist and has a plan to help increase his weight.    REVIEW OF SYSTEMS:  Review of Systems  Constitutional: Positive for appetite change and fatigue.  Gastrointestinal: Positive for constipation.  Skin: Positive for wound (buttock ).  All other systems reviewed and are negative.    PAST MEDICAL/SURGICAL HISTORY:  Past Medical History:  Diagnosis Date  . Bursitis   . CAD (coronary artery disease)     s/p stent placement unknown artery 12/2014,  . COPD (chronic obstructive pulmonary disease) ([facility])   . Diabetes mellitus ([facility])   . GERD (gastroesophageal reflux disease)   . History  of prostate cancer   . Hyperlipemia   . Hypertension   . Lung cancer ([facility]) 12/2017   still having chemo - last was 2 weeks ago  . Peripheral arterial disease ([place])    a. h/o stents. b. 03/2016  - successful PTA and covered stenting using overlapping Viabahn covered stents of a long segment in-stent restenosis of previously placed nitinol self expanding stents in the proximal mid and distal right SFA.  . Tobacco abuse    Past Surgical History:  Procedure Laterality Date  . COLONOSCOPY WITH PROPOFOL N/A 11/22/2016   Procedure: COLONOSCOPY WITH PROPOFOL;  Surgeon: [name], MD;  Location[facility];  Service: Endoscopy;  Laterality: N/A;  10:00 am  . CORONARY STENT PLACEMENT  12-2014   [facility] [employer]  . FEMORAL ARTERY STENT Bilateral 03/2013  . PERIPHERAL VASCULAR CATHETERIZATION N/A 04/04/2016   Procedure: Lower Extremity Angiography;  Surgeon: Lorretta Harp, MD;  Location: [facility] LAB;  Service: Cardiovascular;  Laterality: N/A;  . PERIPHERAL VASCULAR CATHETERIZATION N/A 04/04/2016   Procedure: Abdominal Aortogram;  Surgeon: Lorretta Harp, MD;  Location: [facility]  CV LAB;  Service: Cardiovascular;  Laterality: N/A;  . PERIPHERAL VASCULAR CATHETERIZATION  04/04/2016   Procedure: Peripheral Vascular Intervention;  Surgeon: Lorretta Harp, MD;  Location: [facility] LAB;  Service: Cardiovascular;;  rt SFA stent  . POLYPECTOMY  11/22/2016   Procedure: POLYPECTOMY;  Surgeon: [name], MD;  Location[facility];  Service: Endoscopy;;  descending colon polyp, transverse colon polyp, sigmoid colon polyp, rectal polyp  . PORTACATH PLACEMENT Left 02/19/2018   Procedure: INSERTION PORT-A-CATH;  Surgeon: Aviva Signs, MD;  Location: [facility];  Service: General;  Laterality: Left;  . STENT PLACEMENT VASCULAR ([facility] [facility])  2013     SOCIAL HISTORY:  Social History   Socioeconomic History  . Marital status: Single    Spouse name: Not on file  . Number of  children: Not on file  . Years of education: Not on file  . Highest education level: Not on file  Occupational History  . Occupation: unemployed  Social Needs  . Financial resource strain: Not on file  . Food insecurity:    Worry: Not on file    Inability: Not on file  . Transportation needs:    Medical: Not on file    Non-medical: Not on file  Tobacco Use  . Smoking status: Current Every Day Smoker    Packs/day: 0.25    Years: 45.00    Pack years: 11.25    Types: Cigarettes    Start date: 02/12/1971  . Smokeless tobacco: Never Used  Substance and Sexual Activity  . Alcohol use: Not Currently    Alcohol/week: 0.0 standard drinks  . Drug use: No  . Sexual activity: Yes  Lifestyle  . Physical activity:    Days per week: Not on file    Minutes per session: Not on file  . Stress: Not on file  Relationships  . Social connections:    Talks on phone: Not on file    Gets together: Not on file    Attends religious service: Not on file    Active member of club or organization: Not on file    Attends meetings of clubs or organizations: Not on file    Relationship status: Not on file  . Intimate partner violence:    Fear of current or ex partner: Not on file    Emotionally abused: Not on file    Physically abused: Not on file    Forced sexual activity: Not on file  Other Topics Concern  . Not on file  Social History Narrative  . Not on file    FAMILY HISTORY:  Family History  Problem Relation Age of Onset  . Diabetes Mother   . Heart disease Mother   . Hypertension Mother   . Healthy Brother   . Other Brother        accident   . Arthritis Sister   . Arthritis Sister   . SIDS Brother   . Colon cancer Neg Hx     CURRENT MEDICATIONS:  Outpatient Encounter Medications as of 01/03/2019  Medication Sig  . acetaminophen (TYLENOL) 325 MG tablet TK 2 TS PO Q 4 H PRF PAIN/FEVER  . albuterol (PROAIR HFA) 108 (90 Base) MCG/ACT inhaler Inhale 1-2 puffs into the lungs every 6  (six) hours as needed for wheezing or shortness of breath.  [name] aspirin 81 MG chewable tablet Chew 81 mg by mouth every morning.   . ASPIRIN LOW DOSE 81 MG EC tablet TK 1 T PO  QD  . atorvastatin (LIPITOR) 80 MG tablet Take 80 mg by mouth at bedtime.   . calcium carbonate (OS-CAL - DOSED IN MG OF ELEMENTAL CALCIUM) 1250 (500 Ca) MG tablet Take 1 tablet (500 mg of elemental calcium total) by mouth 3 (three) times daily with meals.  . CARBOPLATIN IV Inject into the vein every 21 ( twenty-one) days.   . cilostazol (PLETAL) 50 MG tablet TAKE 1 TABLET(50 MG) BY MOUTH TWICE DAILY (Patient taking differently: Take 50 mg by mouth 2 (two) times daily. )  . Coenzyme Q10 100 MG capsule Take 100 mg by mouth at bedtime.   . Diphenhyd-Hydrocort-Nystatin (FIRST-DUKES MOUTHWASH) SUSP Use as directed 5 mLs in the mouth or throat 4 (four) times daily -  before meals and at bedtime.  . feeding supplement, ENSURE ENLIVE, (ENSURE ENLIVE) LIQD Take 237 mLs by mouth 4 (four) times daily.  . fentaNYL (DURAGESIC) 50 MCG/HR Place 1 patch onto the skin every 3 (three) days.  . fluticasone (FLONASE) 50 MCG/ACT nasal spray Place 1 spray into both nostrils every evening.   . Gemcitabine HCl (GEMZAR IV) Inject into the vein. Day 1 day 8 every 21 days.  [name] glipiZIDE (GLUCOTROL XL) 2.5 MG 24 hr tablet TK 1 T PO QD  . glipiZIDE (GLUCOTROL) 5 MG tablet Take 0.5 tablets (2.5 mg total) by mouth daily.  [name] HYDROmorphone (DILAUDID) 2 MG tablet Take 2 tablets (4 mg total) by mouth every 6 (six) hours as needed (break through pain).  [name] ipratropium-albuterol (DUONEB) 0.5-2.5 (3) MG/3ML SOLN Take 3 mLs by nebulization every 6 (six) hours as needed (for shortness of breath).  . lidocaine-prilocaine (EMLA) cream Apply a quarter size amount to affected area 1 hour prior to coming to chemotherapy.  Do not rub in.  Cover with plastic wrap.  . magnesium oxide (MAG-OX) 400 (241.3 Mg) MG tablet Take 2 tablets (800 mg total) by mouth 2 (two) times  daily.  . metoprolol succinate (TOPROL-XL) 50 MG 24 hr tablet Take 50 mg by mouth daily.   . mirtazapine (REMERON) 7.5 MG tablet Take 1 tablet (7.5 mg total) by mouth at bedtime.  [name] MUCUS RELIEF 600 MG 12 hr tablet TK 1 T PO  BID FOR SEASONAL ALLERGIES FOR 7 DAYS  . nicotine (NICODERM CQ - DOSED IN MG/24 HOURS) 14 mg/24hr patch Place 1 patch (14 mg total) onto the skin daily.  [name] nystatin (MYCOSTATIN) 100000 UNIT/ML suspension   . Omega-3 Fatty Acids (FISH OIL) 1000 MG CAPS Take 1 capsule by mouth at bedtime.   . ondansetron (ZOFRAN) 8 MG tablet Take 1 tablet (8 mg total) by mouth every 8 (eight) hours as needed for nausea or vomiting.  . ondansetron (ZOFRAN-ODT) 8 MG disintegrating tablet DIS ONE T PO  Q 8 H PRF NAUSEA  . pantoprazole (PROTONIX) 40 MG tablet Take 40 mg by mouth 2 (two) times daily.  . potassium chloride (K-DUR) 10 MEQ tablet Take 2 tablets (20 mEq total) by mouth daily.  . prochlorperazine (COMPAZINE) 10 MG tablet Take 1 tablet (10 mg total) by mouth every 6 (six) hours as needed for nausea or vomiting.  . sorbitol 70 % SOLN Take 30 mLs by mouth as needed for moderate constipation.  . triamcinolone cream (KENALOG) 0.1 % Apply topically 2 (two) times daily.  . cephALEXin (KEFLEX) 500 MG capsule Take 1 capsule (500 mg total) by mouth 2 (two) times daily.   No facility-administered encounter medications on file as of 01/03/2019.  ALLERGIES:  Allergies  Allergen Reactions  . Lisinopril Anaphylaxis  . Penicillins Anaphylaxis    Has patient had a PCN reaction causing immediate rash, facial/tongue/throat swelling, SOB or lightheadedness with hypotension:Yes Has patient had a PCN reaction causing severe rash involving mucus membranes or skin necrosis:unsure Has patient had a PCN reaction that required hospitalization:unsure Has patient had a PCN reaction occurring within the last 10 years:~10 years per patient If all of the above answers are "NO", then may proceed with  Cephalosporin use.   . Ace Inhibitors Swelling     PHYSICAL EXAM:  ECOG Performance status: 1  Vitals:   01/03/19 0854 01/03/19 0911  BP: (!) 92/57 (!) 92/57  Pulse: (!) 139 (!) 139  Resp: 18   Temp: 98.3 F (36.8 C) 98.3 F (36.8 C)  SpO2: 100% 100%   Filed Weights   01/03/19 0854  Weight: 104 lb 14.4 oz (47.6 kg)    Physical Exam Constitutional:      Appearance: Normal appearance. He is normal weight.  Cardiovascular:     Rate and Rhythm: Normal rate and regular rhythm.     Heart sounds: Normal heart sounds.  Pulmonary:     Effort: Pulmonary effort is normal.     Breath sounds: Normal breath sounds.  Musculoskeletal: Normal range of motion.  Skin:    General: Skin is warm and dry.  Neurological:     Mental Status: He is alert and oriented to person, place, and time. Mental status is at baseline.  Psychiatric:        Mood and Affect: Mood normal.        Behavior: Behavior normal.        Thought Content: Thought content normal.        Judgment: Judgment normal.   Left buttock pressure ulcer present.   LABORATORY DATA:  I have reviewed the labs as listed.  CBC    Component Value Date/Time   WBC 10.7 (H) 11/29/2018 0515   RBC 3.03 (L) 11/29/2018 0515   HGB 8.0 (L) 11/29/2018 0515   HCT 23.8 (L) 11/29/2018 0515   PLT 383 11/29/2018 0515   MCV 78.5 (L) 11/29/2018 0515   MCH 26.4 11/29/2018 0515   MCHC 33.6 11/29/2018 0515   RDW 16.4 (H) 11/29/2018 0515   LYMPHSABS 0.4 (L) 11/26/2018 1550   MONOABS 1.2 (H) 11/26/2018 1550   EOSABS 0.0 11/26/2018 1550   BASOSABS 0.0 11/26/2018 1550   CMP Latest Ref Rng & Units 12/06/2018 12/05/2018 12/04/2018  Glucose 70 - 99 mg/dL 103(H) 133(H) 116(H)  BUN 8 - 23 mg/dL 34(H) 32(H) 44(H)  Creatinine 0.61 - 1.24 mg/dL 2.49(H) 2.55(H) 3.54(H)  Sodium 135 - 145 mmol/L 134(L) 134(L) 136  Potassium 3.5 - 5.1 mmol/L 3.7 2.0(LL) 2.3(LL)  Chloride 98 - 111 mmol/L 112(H) 107 109  CO2 22 - 32 mmol/L 15(L) 16(L) 16(L)  Calcium 8.9 -  10.3 mg/dL 7.5(L) 7.1(L) 6.9(L)  Total Protein 6.5 - 8.1 g/dL - - -  Total Bilirubin 0.3 - 1.2 mg/dL - - -  Alkaline Phos 38 - 126 U/L - - -  AST 15 - 41 U/L - - -  ALT 0 - 44 U/L - - -       DIAGNOSTIC IMAGING:  I have independently reviewed the scans and discussed with the patient.   I have reviewed Francene Finders, NP's note and agree with the documentation.  I personally performed a face-to-face visit, made revisions and my assessment and plan is as  follows.    ASSESSMENT & PLAN:   Squamous cell carcinoma of right lung ([place]) 1.  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 -CT scan on 04/19/2018 showed regression of the tumor which is sub-solid in appearance with no new areas.  - Consolidation durvalumab started on 05/03/2018 until 09/19/2018 with CT scan on 07/31/2018 showing slight interval worsening of destruction of the posterior lateral right second and third ribs.  There is also slight worsening by few millimeters of right apical lung mass. - PET CT scan on 08/30/2018 shows peripheral right apical tumor with rib and chest wall invasion, progressive compared to prior PET scan, with increased chest wall invasion.  Right paratracheal/mediastinal lymph nodes demonstrate increased hypermetabolism.  - 1 dose of palliative radiation on 10/16/2018  -Cycle 1 of gemcitabine and carboplatin on 10/19/2018. -Cycle 2 of gemcitabine and carboplatin on 11/09/2018.  He missed day 8 of gemcitabine. - He was hospitalized from 11/26/2018 through 12/06/2018 with acute renal failure.  He was subsequently discharged to rehab.  He is now living with his sister for the past 1 week.  His last creatinine was 2.5. - He is too weak to receive any further chemotherapy.  I have ordered restaging PET CT scan. - He has developed decubitus ulcer on the left buttock cheek, with pain and induration.  Is also putting out  some pus. - I have given a prescription for Keflex 500 mg twice daily.  I have made a referral to 1 of our surgeons. -I will see him back after the PET CT scan.  If there is any significant worsening we will consider palliative/hospice consult.  2.  Recurrent prostate cancer: He had a history of prostate cancer and reportedly received radiation therapy 6-7 years ago.  He had prostate biopsies on 01/17/2018 which showed recurrence with a Gleason score (5+5)=10.  He will be treated with GnRH agonist.  3.  Cancer related pain: -He has pain in the spine and between his shoulder blades. -He is wearing 50 mcg fentanyl patch.  He is also taking Dilaudid 2 mg 2-3 times per day.   Total time spent is 40 minutes with more than 50% of the time spent face-to-face discussing test results, recent hospitalization records, and coordination of care.    Orders placed this encounter:  Orders Placed This Encounter  Procedures  . NM PET Image Restag (PS) Skull Base To Thigh      Derek Jack, MD [place] [phone]

## 2019-01-09 ENCOUNTER — Ambulatory Visit (HOSPITAL_COMMUNITY): Payer: Medicare Other | Attending: Hematology | Admitting: Physical Therapy

## 2019-01-09 ENCOUNTER — Encounter (HOSPITAL_COMMUNITY): Payer: Self-pay | Admitting: Physical Therapy

## 2019-01-09 ENCOUNTER — Other Ambulatory Visit (HOSPITAL_COMMUNITY): Payer: Self-pay | Admitting: Nurse Practitioner

## 2019-01-09 ENCOUNTER — Other Ambulatory Visit: Payer: Self-pay

## 2019-01-09 DIAGNOSIS — S31000D Unspecified open wound of lower back and pelvis without penetration into retroperitoneum, subsequent encounter: Secondary | ICD-10-CM

## 2019-01-09 DIAGNOSIS — S31000A Unspecified open wound of lower back and pelvis without penetration into retroperitoneum, initial encounter: Secondary | ICD-10-CM

## 2019-01-09 NOTE — Therapy (Addendum)
[place], [place], [address] Phone: [phone]   Fax:  [phone]  Wound Care Evaluation  Patient Details  Name: Fred Hernandez MRN: [id number] Date of Birth: [birth year] Referring Provider (PT): Derek Jack   Encounter Date: 01/09/2019  PT End of Session - 01/09/19 1649    Visit Number  1    Number of Visits  8    Date for PT Re-Evaluation  02/08/19    Authorization Type  [employer] medicare/medicaid    Authorization - Visit Number  1    Authorization - Number of Visits  8    PT Start Time  1115    PT Stop Time  1150    PT Time Calculation (min)  35 min    Activity Tolerance  Patient tolerated treatment well       Past Medical History:  Diagnosis Date  . Bursitis   . CAD (coronary artery disease)     s/p stent placement unknown artery 12/2014,  . COPD (chronic obstructive pulmonary disease) ([place])   . Diabetes mellitus ([place])   . GERD (gastroesophageal reflux disease)   . History of prostate cancer   . Hyperlipemia   . Hypertension   . Lung cancer ([place]) 12/2017   still having chemo - last was 2 weeks ago  . Peripheral arterial disease ([place])    a. h/o stents. b. 03/2016  - successful PTA and covered stenting using overlapping Viabahn covered stents of a long segment in-stent restenosis of previously placed nitinol self expanding stents in the proximal mid and distal right SFA.  . Tobacco abuse     Past Surgical History:  Procedure Laterality Date  . COLONOSCOPY WITH PROPOFOL N/A 11/22/2016   Procedure: COLONOSCOPY WITH PROPOFOL;  Surgeon: [name], MD;  [facility];  Service: Endoscopy;  Laterality: N/A;  10:00 am  . CORONARY STENT PLACEMENT  12-2014   [facility] [place] [facility] [employer]  . FEMORAL ARTERY STENT Bilateral 03/2013  . PERIPHERAL VASCULAR CATHETERIZATION N/A 04/04/2016   Procedure: Lower Extremity Angiography;  Surgeon: Lorretta Harp, MD;  Location: [place] [facility];   Service: Cardiovascular;  Laterality: N/A;  . PERIPHERAL VASCULAR CATHETERIZATION N/A 04/04/2016   Procedure: Abdominal Aortogram;  Surgeon: Lorretta Harp, MD;  Location: [place];  Service: Cardiovascular;  Laterality: N/A;  . PERIPHERAL VASCULAR CATHETERIZATION  04/04/2016   Procedure: Peripheral Vascular Intervention;  Surgeon: Lorretta Harp, MD;  Location: [facility];  Service: Cardiovascular;;  rt SFA stent  . POLYPECTOMY  11/22/2016   Procedure: POLYPECTOMY;  Surgeon: [name], MD;  [facility];  Service: Endoscopy;;  descending colon polyp, transverse colon polyp, sigmoid colon polyp, rectal polyp  . PORTACATH PLACEMENT Left 02/19/2018   Procedure: INSERTION PORT-A-CATH;  Surgeon: Aviva Signs, MD;  Location: [facility];  Service: General;  Laterality: Left;  . STENT PLACEMENT VASCULAR ([place] [facility])  2013    There were no vitals filed for this visit.    [facility] PT Assessment - 01/09/19 0001      Assessment   Medical Diagnosis  nonhealing sacral wound     Referring Provider (PT)  Derek Jack    Onset Date/Surgical Date  12/01/18    Next MD Visit  unknown    Prior Therapy  none      Precautions   Precautions  None      Restrictions   Weight Bearing Restrictions  No  Balance Screen   Has the patient fallen in the past 6 months  No    Has the patient had a decrease in activity level because of a fear of falling?   Yes    Is the patient reluctant to leave their home because of a fear of falling?   No      Home Environment   Living Environment  Private residence      Prior Function   Level of Independence  Independent      Cognition   Overall Cognitive Status  Within Functional Limits for tasks assessed      Wound Therapy - 01/09/19 1259    Subjective  Fred Hernandez states that he was ill and went into the hospital on 11/21/2018; he went to a SNF following his hospital stay and ended up having a wound on his Lt buttock area.  He has  pain whenever there is pressure on his wound .   He states that it heals but then the pulling his pants up an down opens it back up and it just won't heal for good.    Patient and Family Stated Goals  wound to heal; pain to go away     Date of Onset  12/01/18    Prior Treatments  self care of neosporin and dressing     Pain Scale  0-10    Pain Score  7     Pain Type  Acute pain    Pain Location  Buttocks    Pain Orientation  Left    Pain Descriptors / Indicators  Grimacing    Pain Onset  Other (Comment)   with pressure or debridement   Patients Stated Pain Goal  0    Evaluation and Treatment Procedures Explained to Patient/Family  Yes    Evaluation and Treatment Procedures  agreed to    Wound Properties Date First Assessed: 01/09/19 Time First Assessed: 1130 Wound Type: Other (Comment) Location: Sacrum Location Orientation: Left Present on Admission: Yes   Dressing Type  None    Dressing Changed  New    Dressing Status  None    Dressing Change Frequency  PRN    Site / Wound Assessment  Pale    % Wound base Red or Granulating  20%    % Wound base Yellow/Fibrinous Exudate  25%    % Wound base Other/Granulation Tissue (Comment)  55%   pale pink    Peri-wound Assessment  --   darkened tissue around wound for 1cm    Wound Length (cm)  1.8 cm    Wound Width (cm)  1.2 cm    Wound Depth (cm)  0.3 cm    Wound Volume (cm^3)  0.65 cm^3    Wound Surface Area (cm^2)  2.16 cm^2    Undermining (cm)  along inferior boarders .3     Margins  Epibole (rolled edges)    Drainage Amount  None    Treatment  Cleansed;Debridement (Selective)    Selective Debridement - Location  epiboled edges and base of wound to promote blood circulation and healing.     Selective Debridement - Tools Used  Forceps;Scalpel    Selective Debridement - Tissue Removed  slough and dry skin.     Wound Therapy - Clinical Statement  Fred Hernandez is a 63 yo male who was hospitalized on January 22ne with renal failure.  He went to  SNF and was discharged on 12/29/2018.  When he was discharged from the  nursing home he had a wound on his lt sacral area.  They have been trying to complete self care at home with cleansing and putting neosporin on the wound as well as going to the MD and getting antibioitc but the wound will not heal therefore he has been referred to skilled PT.  Evaluation demonstrates a malnutrion male with hx of diabetes with limited ambulation ability who has a non healing wound on his Lt sacral area.  Fred Hernandez will benefit from skilled pt at this time for debridement and dressing change to provide a healing enviornment for his wound.      Factors Delaying/Impairing Wound Healing  Altered sensation;Diabetes Mellitus;Immobility;Polypharmacy;Vascular compromise    Hydrotherapy Plan  Debridement;Dressing change;Patient/family education    Wound Therapy - Frequency  2X / week    Wound Therapy - Current Recommendations  Nutritionist;PT    Wound Plan  Inquire if pt and family would like a referral for the nutritionist, continue to see pt 2x a week until wound no longer has any depth at this point it may be discharged to family for self care.     Dressing   medihoney followed by tegaderm              Objective measurements completed on examination: See above findings.            PT Education - 01/09/19 1647    Education Details  keep pressure off of the wound.  Keep dressing on if it falls off replace with neosporin and bandage but keep wound covered     Person(s) Educated  Patient;Caregiver(s)    Methods  Explanation    Comprehension  Verbalized understanding       PT Short Term Goals - 01/09/19 1651      PT SHORT TERM GOAL #1   Title  Pt wound to decrease to 1.0 x .7 x.1 to allow family to be comfortable with self care.     Time  2    Period  Weeks    Status  New    Target Date  01/16/19      PT SHORT TERM GOAL #2   Title  Pt to have no pain with presssue on his lt sacral area to be able  lean back in a chair for less than 15 minutes at a time     Time  4    Period  Weeks    Status  New        PT Long Term Goals - 01/09/19 1653      PT LONG TERM GOAL #1   Title  wound to be .5x.3 with no depth to allow pt to be comfortable with self care     Time  4    Period  Weeks    Status  New    Target Date  02/06/19      PT LONG TERM GOAL #2   Title  PT to be able to dress self without reopening wound.     Time  4    Period  Weeks    Status  New           Plan - 01/09/19 1649    Clinical Impression Statement  see above     Personal Factors and Comorbidities  Age;Comorbidity 3+;Time since onset of injury/illness/exacerbation    Examination-Activity Limitations  Bed Mobility;Sit    Stability/Clinical Decision Making  Evolving/Moderate complexity    Clinical Decision Making  Low  Rehab Potential  Good    PT Frequency  2x / week    PT Treatment/Interventions  ADLs/Self Care Home Management;Other (comment)   cleansing and debridement    Consulted and Agree with Plan of Care  Patient       Patient will benefit from skilled therapeutic intervention in order to improve the following deficits and impairments:  Decreased skin integrity, Pain  Visit Diagnosis: Sacral wound, subsequent encounter    Problem List Patient Active Problem List   Diagnosis Date Noted  . Dehydration   . Non-intractable vomiting   . Malnutrition of moderate degree 12/03/2018  . Palliative care by specialist   . DNR (do not resuscitate) discussion   . Acute renal failure (ARF) ([place]) 11/26/2018  . Anemia associated with chemotherapy 11/26/2018  . Goals of care, counseling/discussion 10/04/2018  . History of prostate cancer 06/26/2018  . Squamous cell carcinoma of right lung ([place]) 02/20/2018  . Adenocarcinoma of prostate ([place]) 01/17/2018  . History of colonic polyps 09/20/2016  . Essential hypertension 04/05/2016  . Hyperlipidemia 04/05/2016  . Diabetes mellitus with circulatory  complication ([place]) 67/20/9198  . Tobacco abuse 04/05/2016  . Claudication ([place]) 04/04/2016  . Peripheral arterial disease ([place]) 03/16/2016  . Left shoulder pain 12/23/2015    Rayetta Humphrey, PT CLT [phone] 01/09/2019, 4:57 PM  [place] [address] [place], [place], [address] Phone: [phone]   Fax:  [phone]  Name: Fred Hernandez MRN: [id number] Date of Birth: 04-29-1956

## 2019-01-10 NOTE — Addendum Note (Signed)
Addended by: Leeroy Cha on: 01/10/2019 02:34 PM   Modules accepted: Orders

## 2019-01-11 ENCOUNTER — Other Ambulatory Visit: Payer: Self-pay

## 2019-01-11 ENCOUNTER — Encounter (HOSPITAL_COMMUNITY): Payer: Self-pay | Admitting: Physical Therapy

## 2019-01-11 ENCOUNTER — Ambulatory Visit (HOSPITAL_COMMUNITY): Payer: Medicare Other | Admitting: Physical Therapy

## 2019-01-11 DIAGNOSIS — S31000D Unspecified open wound of lower back and pelvis without penetration into retroperitoneum, subsequent encounter: Secondary | ICD-10-CM

## 2019-01-11 NOTE — Therapy (Signed)
[place], [place], [address] Phone: [phone]   Fax:  [phone]  Wound Care Therapy  Patient Details  Name: Fred Hernandez MRN: [id number] Date of Birth: Nov 19, 1955 Referring Provider (PT): Derek Jack   Encounter Date: 01/11/2019  PT End of Session - 01/11/19 1558    Visit Number  2    Number of Visits  8    Date for PT Re-Evaluation  02/08/19    Authorization Type  [employer]HC medicare/medicaid    Authorization - Visit Number  2    Authorization - Number of Visits  8    PT Start Time  8182    PT Stop Time  1535    PT Time Calculation (min)  20 min    Activity Tolerance  Patient tolerated treatment well       Past Medical History:  Diagnosis Date  . Bursitis   . CAD (coronary artery disease)     s/p stent placement unknown artery 12/2014,  . COPD (chronic obstructive pulmonary disease) ([place])   . Diabetes mellitus ([place])   . GERD (gastroesophageal reflux disease)   . History of prostate cancer   . Hyperlipemia   . Hypertension   . Lung cancer ([place]) 12/2017   still having chemo - last was 2 weeks ago  . Peripheral arterial disease ([place])    a. h/o stents. b. 03/2016  - successful PTA and covered stenting using overlapping Viabahn covered stents of a long segment in-stent restenosis of previously placed nitinol self expanding stents in the proximal mid and distal right SFA.  . Tobacco abuse     Past Surgical History:  Procedure Laterality Date  . COLONOSCOPY WITH PROPOFOL N/A 11/22/2016   Procedure: COLONOSCOPY WITH PROPOFOL;  Surgeon: [name], MD;  [facility];  Service: Endoscopy;  Laterality: N/A;  10:00 am  . CORONARY STENT PLACEMENT  12-2014   [facility] [employer]  . FEMORAL ARTERY STENT Bilateral 03/2013  . PERIPHERAL VASCULAR CATHETERIZATION N/A 04/04/2016   Procedure: Lower Extremity Angiography;  Surgeon: Lorretta Harp, MD;  Location: [facility];   Service: Cardiovascular;  Laterality: N/A;  . PERIPHERAL VASCULAR CATHETERIZATION N/A 04/04/2016   Procedure: Abdominal Aortogram;  Surgeon: Lorretta Harp, MD;  Location: [facility];  Service: Cardiovascular;  Laterality: N/A;  . PERIPHERAL VASCULAR CATHETERIZATION  04/04/2016   Procedure: Peripheral Vascular Intervention;  Surgeon: Lorretta Harp, MD;  Location: [facility];  Service: Cardiovascular;;  rt SFA stent  . POLYPECTOMY  11/22/2016   Procedure: POLYPECTOMY;  Surgeon: [name], MD;  [facility];  Service: Endoscopy;;  descending colon polyp, transverse colon polyp, sigmoid colon polyp, rectal polyp  . PORTACATH PLACEMENT Left 02/19/2018   Procedure: INSERTION PORT-A-CATH;  Surgeon: Aviva Signs, MD;  Location: [facility];  Service: General;  Laterality: Left;  . STENT PLACEMENT VASCULAR ([place] [facility])  2013    There were no vitals filed for this visit.              Wound Therapy - 01/11/19 1554    Subjective  PT worried due to dressing falling off     Patient and Family Stated Goals  wound to heal; pain to go away     Date of Onset  12/01/18    Prior Treatments  self care of neosporin and dressing     Pain Scale  0-10    Pain  Score  7    only with debridement   Patients Stated Pain Goal  0    Pain Intervention(s)  Emotional support    Evaluation and Treatment Procedures Explained to Patient/Family  Yes    Evaluation and Treatment Procedures  agreed to    Wound Properties Date First Assessed: 01/09/19 Time First Assessed: 1130 Wound Type: Other (Comment) Location: Sacrum Location Orientation: Left Present on Admission: Yes   Dressing Type  None    Dressing Status  None    Dressing Change Frequency  PRN    Site / Wound Assessment  Pale    % Wound base Red or Granulating  25%    % Wound base Yellow/Fibrinous Exudate  20%    % Wound base Other/Granulation Tissue (Comment)  55%   pale pink    Peri-wound Assessment  --   darkened tissue  around wound for 1cm    Margins  Epibole (rolled edges)    Drainage Amount  None    Treatment  Cleansed;Debridement (Selective)    Selective Debridement - Location  epiboled edges and base of wound to promote blood circulation and healing.     Selective Debridement - Tools Used  Forceps;Scalpel    Selective Debridement - Tissue Removed  slough and dry skin.     Wound Therapy - Clinical Statement  Wound has appeared to have decreased depth.  Therapist explained to pt and family that pt does not have to come in if dressing falls off they can continue to dress the wound with neosporin and a bandaid as they had prior to coming to therapy.     Factors Delaying/Impairing Wound Healing  Altered sensation;Diabetes Mellitus;Immobility;Polypharmacy;Vascular compromise    Hydrotherapy Plan  Debridement;Dressing change;Patient/family education    Wound Therapy - Frequency  2X / week    Wound Therapy - Current Recommendations  Nutritionist;PT    Wound Plan  Inquire if pt and family would like a referral for the nutritionist, continue to see pt 2x a week until wound no longer has any depth at this point it may be discharged to family for self care.     Dressing   medihoney followed by tegaderm                 PT Short Term Goals - 01/11/19 1559      PT SHORT TERM GOAL #1   Title  Pt wound to decrease to 1.0 x .7 x.1 to allow family to be comfortable with self care.     Time  2    Period  Weeks    Status  On-going    Target Date  01/16/19      PT SHORT TERM GOAL #2   Title  Pt to have no pain with presssue on his lt sacral area to be able lean back in a chair for less than 15 minutes at a time     Time  4    Period  Weeks    Status  On-going        PT Long Term Goals - 01/11/19 1559      PT LONG TERM GOAL #1   Title  wound to be .5x.3 with no depth to allow pt to be comfortable with self care     Time  4    Period  Weeks    Status  On-going      PT LONG TERM GOAL #2   Title  PT  to be able to  dress self without reopening wound.     Time  4    Period  Weeks    Status  On-going            Plan - 01/11/19 1559    Clinical Impression Statement  as above     Personal Factors and Comorbidities  Age;Comorbidity 3+;Time since onset of injury/illness/exacerbation    Examination-Activity Limitations  Bed Mobility;Sit    Stability/Clinical Decision Making  Evolving/Moderate complexity    Rehab Potential  Good    PT Frequency  2x / week    PT Treatment/Interventions  ADLs/Self Care Home Management;Other (comment)   cleansing and debridement    PT Next Visit Plan  continue wound care     Consulted and Agree with Plan of Care  Patient       Patient will benefit from skilled therapeutic intervention in order to improve the following deficits and impairments:  Decreased skin integrity, Pain  Visit Diagnosis: Sacral wound, subsequent encounter     Problem List Patient Active Problem List   Diagnosis Date Noted  . Dehydration   . Non-intractable vomiting   . Malnutrition of moderate degree 12/03/2018  . Palliative care by specialist   . DNR (do not resuscitate) discussion   . Acute renal failure (ARF) ([place]) 11/26/2018  . Anemia associated with chemotherapy 11/26/2018  . Goals of care, counseling/discussion 10/04/2018  . History of prostate cancer 06/26/2018  . Squamous cell carcinoma of right lung ([place]) 02/20/2018  . Adenocarcinoma of prostate ([place]) 01/17/2018  . History of colonic polyps 09/20/2016  . Essential hypertension 04/05/2016  . Hyperlipidemia 04/05/2016  . Diabetes mellitus with circulatory complication ([place]) 54/65/6812  . Tobacco abuse 04/05/2016  . Claudication ([place]) 04/04/2016  . Peripheral arterial disease ([place]) 03/16/2016  . Left shoulder pain 12/23/2015    Rayetta Humphrey, PT CLT [phone] 01/11/2019, 4:00 PM  [place], [place], [address] Phone: [phone]    Fax:  [phone]  Name: SIYON LINCK MRN: [id number] Date of Birth: Feb 15, 1956

## 2019-01-14 ENCOUNTER — Other Ambulatory Visit: Payer: Self-pay

## 2019-01-14 ENCOUNTER — Other Ambulatory Visit (HOSPITAL_COMMUNITY): Payer: Self-pay | Admitting: *Deleted

## 2019-01-14 ENCOUNTER — Encounter (HOSPITAL_COMMUNITY)
Admission: RE | Admit: 2019-01-14 | Discharge: 2019-01-14 | Disposition: A | Discharge status: Home / Self Care | Payer: Medicare Other | Source: Ambulatory Visit | Attending: Hematology | Admitting: Hematology

## 2019-01-14 DIAGNOSIS — C3491 Malignant neoplasm of unspecified part of right bronchus or lung: Secondary | ICD-10-CM

## 2019-01-14 MED ORDER — FENTANYL 50 MCG/HR TD PT72: 1.0000 | MEDICATED_PATCH | TRANSDERMAL | 0 refills | Status: AC

## 2019-01-14 MED ORDER — FLUDEOXYGLUCOSE F - 18 (FDG) INJECTION
7.2400 | Freq: Once | INTRAVENOUS | Status: AC | PRN
  Administered 2019-01-14: 7.24 via INTRAVENOUS

## 2019-01-14 MED ORDER — HYDROMORPHONE HCL 2 MG PO TABS: 4.0000 mg | ORAL_TABLET | Freq: Four times a day (QID) | ORAL | 0 refills | Status: DC | PRN

## 2019-01-14 MED ORDER — HYDROMORPHONE HCL 2 MG PO TABS: 4.0000 mg | ORAL_TABLET | Freq: Four times a day (QID) | ORAL | 0 refills | Status: AC | PRN

## 2019-01-14 MED ORDER — FENTANYL 50 MCG/HR TD PT72: 1.0000 | MEDICATED_PATCH | TRANSDERMAL | 0 refills | Status: DC

## 2019-01-15 ENCOUNTER — Telehealth: Payer: Self-pay | Admitting: General Practice

## 2019-01-15 ENCOUNTER — Ambulatory Visit (HOSPITAL_COMMUNITY): Payer: Medicare Other | Admitting: Physical Therapy

## 2019-01-15 ENCOUNTER — Other Ambulatory Visit: Payer: Self-pay

## 2019-01-15 ENCOUNTER — Telehealth: Payer: Self-pay | Admitting: Student

## 2019-01-15 ENCOUNTER — Ambulatory Visit: Payer: Medicare Other | Admitting: Student

## 2019-01-15 DIAGNOSIS — S31000D Unspecified open wound of lower back and pelvis without penetration into retroperitoneum, subsequent encounter: Secondary | ICD-10-CM

## 2019-01-15 NOTE — Therapy (Signed)
[place], [place], [address] Phone: [phone]   Fax:  [phone]  Wound Care Therapy  Patient Details  Name: Fred Hernandez MRN: [id number] Date of Birth: [birth year] Referring Provider (PT): Derek Jack   Encounter Date: 01/15/2019  PT End of Session - 01/15/19 1644    Visit Number  3    Number of Visits  8    Date for PT Re-Evaluation  02/08/19    Authorization Type  [employer] medicare/medicaid    Authorization - Visit Number  3    Authorization - Number of Visits  8    PT Start Time  1115    PT Stop Time  1145    PT Time Calculation (min)  30 min    Activity Tolerance  Patient tolerated treatment well       Past Medical History:  Diagnosis Date  . Bursitis   . CAD (coronary artery disease)     s/p stent placement unknown artery 12/2014,  . COPD (chronic obstructive pulmonary disease) ([place])   . Diabetes mellitus ([place])   . GERD (gastroesophageal reflux disease)   . History of prostate cancer   . Hyperlipemia   . Hypertension   . Lung cancer ([place]) 12/2017   still having chemo - last was 2 weeks ago  . Peripheral arterial disease ([place])    a. h/o stents. b. 03/2016  - successful PTA and covered stenting using overlapping Viabahn covered stents of a long segment in-stent restenosis of previously placed nitinol self expanding stents in the proximal mid and distal right SFA.  . Tobacco abuse     Past Surgical History:  Procedure Laterality Date  . COLONOSCOPY WITH PROPOFOL N/A 11/22/2016   Procedure: COLONOSCOPY WITH PROPOFOL;  Surgeon: [name], MD;  [facility];  Service: Endoscopy;  Laterality: N/A;  10:00 am  . CORONARY STENT PLACEMENT  12-2014   [facility] [employer]  . FEMORAL ARTERY STENT Bilateral 03/2013  . PERIPHERAL VASCULAR CATHETERIZATION N/A 04/04/2016   Procedure: Lower Extremity Angiography;  Surgeon: Lorretta Harp, MD;  Location: [facility];   Service: Cardiovascular;  Laterality: N/A;  . PERIPHERAL VASCULAR CATHETERIZATION N/A 04/04/2016   Procedure: Abdominal Aortogram;  Surgeon: Lorretta Harp, MD;  Location: [facility];  Service: Cardiovascular;  Laterality: N/A;  . PERIPHERAL VASCULAR CATHETERIZATION  04/04/2016   Procedure: Peripheral Vascular Intervention;  Surgeon: Lorretta Harp, MD;  Location: [facility];  Service: Cardiovascular;;  rt SFA stent  . POLYPECTOMY  11/22/2016   Procedure: POLYPECTOMY;  Surgeon: [name], MD;  [facility];  Service: Endoscopy;;  descending colon polyp, transverse colon polyp, sigmoid colon polyp, rectal polyp  . PORTACATH PLACEMENT Left 02/19/2018   Procedure: INSERTION PORT-A-CATH;  Surgeon: Aviva Signs, MD;  Location: [facility];  Service: General;  Laterality: Left;  . STENT PLACEMENT VASCULAR ([place] [facility])  2013    There were no vitals filed for this visit.              Wound Therapy - 01/15/19 1639    Subjective  pt comes today without dressing on wound.     Patient and Family Stated Goals  wound to heal; pain to go away     Date of Onset  12/01/18    Prior Treatments  self care of neosporin and dressing     Pain Scale  0-10    Pain  Score  0-No pain    Evaluation and Treatment Procedures Explained to Patient/Family  Yes    Evaluation and Treatment Procedures  agreed to    Wound Properties Date First Assessed: 01/09/19 Time First Assessed: 1130 Wound Type: Other (Comment) Location: Sacrum Location Orientation: Left Present on Admission: Yes   Dressing Type  None    Dressing Changed  Changed    Dressing Status  None    Dressing Change Frequency  PRN    Site / Wound Assessment  Pink;Red    % Wound base Red or Granulating  85%    % Wound base Yellow/Fibrinous Exudate  15%    % Wound base Other/Granulation Tissue (Comment)  --   pale pink    Peri-wound Assessment  --   darkened tissue around wound for 1cm    Wound Length (cm)  1.8 cm    Wound  Width (cm)  1.2 cm    Wound Depth (cm)  0.1 cm    Wound Volume (cm^3)  0.22 cm^3    Wound Surface Area (cm^2)  2.16 cm^2    Margins  Attached edges (approximated)    Drainage Amount  None    Treatment  Cleansed;Debridement (Selective)    Selective Debridement - Location  epiboled edges and base of wound to promote blood circulation and healing.     Selective Debridement - Tools Used  Forceps;Scalpel    Selective Debridement - Tissue Removed  slough and dry skin.     Wound Therapy - Clinical Statement  wound measured without notable reduction in length or width, however filling in with decreased depth.  wound also with increased granlualtion as compared to last week.  Educated pt on importance of keeping a dressing on wound to normailize moisture and increased healing.  Pt verbalized understanding.  Pt also reported he is seeing a nutitionalist and trying to drink 2-3 ensures daily.  Changed dressing to medipore as tegaderm does not stay on, rolling off.      Factors Delaying/Impairing Wound Healing  Altered sensation;Diabetes Mellitus;Immobility;Polypharmacy;Vascular compromise    Hydrotherapy Plan  Debridement;Dressing change;Patient/family education    Wound Therapy - Frequency  2X / week    Wound Therapy - Current Recommendations  Nutritionist;PT    Wound Plan  continue current wound care with weekly measurements completed.     Dressing   medihoney, 2X2, medipore                PT Short Term Goals - 01/11/19 1559      PT SHORT TERM GOAL #1   Title  Pt wound to decrease to 1.0 x .7 x.1 to allow family to be comfortable with self care.     Time  2    Period  Weeks    Status  On-going    Target Date  01/16/19      PT SHORT TERM GOAL #2   Title  Pt to have no pain with presssue on his lt sacral area to be able lean back in a chair for Fred than 15 minutes at a time     Time  4    Period  Weeks    Status  On-going        PT Long Term Goals - 01/11/19 1559      PT LONG  TERM GOAL #1   Title  wound to be .5x.3 with no depth to allow pt to be comfortable with self care     Time  4  Period  Weeks    Status  On-going      PT LONG TERM GOAL #2   Title  PT to be able to dress self without reopening wound.     Time  4    Period  Weeks    Status  On-going            Plan - 01/15/19 1647    Personal Factors and Comorbidities  Age;Comorbidity 3+;Time since onset of injury/illness/exacerbation    Examination-Activity Limitations  Bed Mobility;Sit    Stability/Clinical Decision Making  Evolving/Moderate complexity    Rehab Potential  Good    PT Frequency  2x / week    PT Treatment/Interventions  ADLs/Self Care Home Management;Other (comment)   cleansing and debridement    PT Next Visit Plan  continue wound care     Consulted and Agree with Plan of Care  Patient       Patient will benefit from skilled therapeutic intervention in order to improve the following deficits and impairments:  Decreased skin integrity, Pain  Visit Diagnosis: Sacral wound, subsequent encounter     Problem List Patient Active Problem List   Diagnosis Date Noted  . Dehydration   . Non-intractable vomiting   . Malnutrition of moderate degree 12/03/2018  . Palliative care by specialist   . DNR (do not resuscitate) discussion   . Acute renal failure (ARF) ([place]) 11/26/2018  . Anemia associated with chemotherapy 11/26/2018  . Goals of care, counseling/discussion 10/04/2018  . History of prostate cancer 06/26/2018  . Squamous cell carcinoma of right lung ([place]) 02/20/2018  . Adenocarcinoma of prostate ([place]) 01/17/2018  . History of colonic polyps 09/20/2016  . Essential hypertension 04/05/2016  . Hyperlipidemia 04/05/2016  . Diabetes mellitus with circulatory complication ([place]) 36/46/8032  . Tobacco abuse 04/05/2016  . Claudication ([place]) 04/04/2016  . Peripheral arterial disease ([place]) 03/16/2016  . Left shoulder pain 12/23/2015   Teena Irani,  PTA/CLT [phone]  Teena Irani 01/15/2019, 4:48 PM  [facility] [address] [place], [place], [address] Phone: [phone]   Fax:  [phone]  Name: Fred Hernandez MRN: [id number] Date of Birth: 06-02-1956

## 2019-01-15 NOTE — Progress Notes (Deleted)
Cardiology Office Note    Date:  01/15/2019   ID:  Teyton, Pattillo 1956-01-30, MRN [id number]  PCP:  Vidal Schwalbe, MD  Cardiologist: Kate Sable, MD    No chief complaint on file.   History of Present Illness:    Fred Hernandez is a 63 y.o. male ***    Past Medical History:  Diagnosis Date  . Bursitis   . CAD (coronary artery disease)     s/p stent placement unknown artery 12/2014,  . COPD (chronic obstructive pulmonary disease) ([place])   . Diabetes mellitus ([place])   . GERD (gastroesophageal reflux disease)   . History of prostate cancer   . Hyperlipemia   . Hypertension   . Lung cancer ([place]) 12/2017   still having chemo - last was 2 weeks ago  . Peripheral arterial disease ([place])    a. h/o stents. b. 03/2016  - successful PTA and covered stenting using overlapping Viabahn covered stents of a long segment in-stent restenosis of previously placed nitinol self expanding stents in the proximal mid and distal right SFA.  . Tobacco abuse     Past Surgical History:  Procedure Laterality Date  . COLONOSCOPY WITH PROPOFOL N/A 11/22/2016   Procedure: COLONOSCOPY WITH PROPOFOL;  Surgeon: [name], MD;  [facility];  Service: Endoscopy;  Laterality: N/A;  10:00 am  . CORONARY STENT PLACEMENT  12-2014   [facility] [employer]  . FEMORAL ARTERY STENT Bilateral 03/2013  . PERIPHERAL VASCULAR CATHETERIZATION N/A 04/04/2016   Procedure: Lower Extremity Angiography;  Surgeon: Lorretta Harp, MD;  Location: [facility];  Service: Cardiovascular;  Laterality: N/A;  . PERIPHERAL VASCULAR CATHETERIZATION N/A 04/04/2016   Procedure: Abdominal Aortogram;  Surgeon: Lorretta Harp, MD;  Location: [facility];  Service: Cardiovascular;  Laterality: N/A;  . PERIPHERAL VASCULAR CATHETERIZATION  04/04/2016   Procedure: Peripheral Vascular Intervention;  Surgeon: Lorretta Harp, MD;  Location: [facility];  Service: Cardiovascular;;  rt SFA stent   . POLYPECTOMY  11/22/2016   Procedure: POLYPECTOMY;  Surgeon: [name], MD;  [facility];  Service: Endoscopy;;  descending colon polyp, transverse colon polyp, sigmoid colon polyp, rectal polyp  . PORTACATH PLACEMENT Left 02/19/2018   Procedure: INSERTION PORT-A-CATH;  Surgeon: Aviva Signs, MD;  Location: [facility];  Service: General;  Laterality: Left;  . STENT PLACEMENT VASCULAR ([place] [facility]X)  2013    Current Medications: Outpatient Medications Prior to Visit  Medication Sig Dispense Refill  . acetaminophen (TYLENOL) 325 MG tablet TK 2 TS PO Q 4 H PRF PAIN/FEVER    . albuterol (PROAIR HFA) 108 (90 Base) MCG/ACT inhaler Inhale 1-2 puffs into the lungs every 6 (six) hours as needed for wheezing or shortness of breath.    [name] aspirin 81 MG chewable tablet Chew 81 mg by mouth every morning.     . ASPIRIN LOW DOSE 81 MG EC tablet TK 1 T PO  QD    . atorvastatin (LIPITOR) 80 MG tablet Take 80 mg by mouth at bedtime.     . calcium carbonate (OS-CAL - DOSED IN MG OF ELEMENTAL CALCIUM) 1250 (500 Ca) MG tablet Take 1 tablet (500 mg of elemental calcium total) by mouth 3 (three) times daily with meals.    . CARBOPLATIN IV Inject into the vein every 21 ( twenty-one) days.     . cephALEXin (KEFLEX) 500 MG capsule Take 1 capsule (500 mg total) by  mouth 2 (two) times daily. 14 capsule 0  . cilostazol (PLETAL) 50 MG tablet TAKE 1 TABLET(50 MG) BY MOUTH TWICE DAILY (Patient taking differently: Take 50 mg by mouth 2 (two) times daily. ) 60 tablet 0  . Coenzyme Q10 100 MG capsule Take 100 mg by mouth at bedtime.     . Diphenhyd-Hydrocort-Nystatin (FIRST-DUKES MOUTHWASH) SUSP Use as directed 5 mLs in the mouth or throat 4 (four) times daily -  before meals and at bedtime. 480 mL 1  . feeding supplement, ENSURE ENLIVE, (ENSURE ENLIVE) LIQD Take 237 mLs by mouth 4 (four) times daily.    . fentaNYL (DURAGESIC) 50 MCG/HR Place 1 patch onto the skin every 3 (three) days. 5 patch 0  . fluticasone  (FLONASE) 50 MCG/ACT nasal spray Place 1 spray into both nostrils every evening.     . Gemcitabine HCl (GEMZAR IV) Inject into the vein. Day 1 day 8 every 21 days.    [name] glipiZIDE (GLUCOTROL XL) 2.5 MG 24 hr tablet TK 1 T PO QD    . glipiZIDE (GLUCOTROL) 5 MG tablet Take 0.5 tablets (2.5 mg total) by mouth daily.    [name] HYDROmorphone (DILAUDID) 2 MG tablet Take 2 tablets (4 mg total) by mouth every 6 (six) hours as needed (break through pain). 30 tablet 0  . ipratropium-albuterol (DUONEB) 0.5-2.5 (3) MG/3ML SOLN Take 3 mLs by nebulization every 6 (six) hours as needed (for shortness of breath).    . lidocaine-prilocaine (EMLA) cream Apply a quarter size amount to affected area 1 hour prior to coming to chemotherapy.  Do not rub in.  Cover with plastic wrap. 30 g 2  . magnesium oxide (MAG-OX) 400 (241.3 Mg) MG tablet Take 2 tablets (800 mg total) by mouth 2 (two) times daily.    . metoprolol succinate (TOPROL-XL) 50 MG 24 hr tablet Take 50 mg by mouth daily.     . mirtazapine (REMERON) 7.5 MG tablet Take 1 tablet (7.5 mg total) by mouth at bedtime.    [name] MUCUS RELIEF 600 MG 12 hr tablet TK 1 T PO  BID FOR SEASONAL ALLERGIES FOR 7 DAYS    . nicotine (NICODERM CQ - DOSED IN MG/24 HOURS) 14 mg/24hr patch Place 1 patch (14 mg total) onto the skin daily.    [name] nystatin (MYCOSTATIN) 100000 UNIT/ML suspension     . Omega-3 Fatty Acids (FISH OIL) 1000 MG CAPS Take 1 capsule by mouth at bedtime.     . ondansetron (ZOFRAN) 8 MG tablet Take 1 tablet (8 mg total) by mouth every 8 (eight) hours as needed for nausea or vomiting. 30 tablet 2  . ondansetron (ZOFRAN-ODT) 8 MG disintegrating tablet DIS ONE T PO  Q 8 H PRF NAUSEA    . pantoprazole (PROTONIX) 40 MG tablet Take 40 mg by mouth 2 (two) times daily.    . potassium chloride (K-DUR) 10 MEQ tablet Take 2 tablets (20 mEq total) by mouth daily.    . prochlorperazine (COMPAZINE) 10 MG tablet Take 1 tablet (10 mg total) by mouth every 6 (six) hours as needed for  nausea or vomiting. 30 tablet 2  . sorbitol 70 % SOLN Take 30 mLs by mouth as needed for moderate constipation.    . triamcinolone cream (KENALOG) 0.1 % Apply topically 2 (two) times daily.     No facility-administered medications prior to visit.      Allergies:   Lisinopril; Penicillins; and Ace inhibitors   Social History   Socioeconomic History  .  Marital status: Single    Spouse name: Not on file  . Number of children: Not on file  . Years of education: Not on file  . Highest education level: Not on file  Occupational History  . Occupation: unemployed  Social Needs  . Financial resource strain: Not on file  . Food insecurity:    Worry: Not on file    Inability: Not on file  . Transportation needs:    Medical: Not on file    Non-medical: Not on file  Tobacco Use  . Smoking status: Current Every Day Smoker    Packs/day: 0.25    Years: 45.00    Pack years: 11.25    Types: Cigarettes    Start date: 02/12/1971  . Smokeless tobacco: Never Used  Substance and Sexual Activity  . Alcohol use: Not Currently    Alcohol/week: 0.0 standard drinks  . Drug use: No  . Sexual activity: Yes  Lifestyle  . Physical activity:    Days per week: Not on file    Minutes per session: Not on file  . Stress: Not on file  Relationships  . Social connections:    Talks on phone: Not on file    Gets together: Not on file    Attends religious service: Not on file    Active member of club or organization: Not on file    Attends meetings of clubs or organizations: Not on file    Relationship status: Not on file  Other Topics Concern  . Not on file  Social History Narrative  . Not on file     Family History:  The patient's ***family history includes Arthritis in his sister and sister; Diabetes in his mother; Healthy in his brother; Heart disease in his mother; Hypertension in his mother; Other in his brother; SIDS in his brother.   Review of Systems:   Please see the history of present  illness.     General:  No chills, fever, night sweats or weight changes.  Cardiovascular:  No chest pain, dyspnea on exertion, edema, orthopnea, palpitations, paroxysmal nocturnal dyspnea. Dermatological: No rash, lesions/masses Respiratory: No cough, dyspnea Urologic: No hematuria, dysuria Abdominal:   No nausea, vomiting, diarrhea, bright red blood per rectum, melena, or hematemesis Neurologic:  No visual changes, wkns, changes in mental status. All other systems reviewed and are otherwise negative except as noted above.   Physical Exam:    VS:  There were no vitals taken for this visit.   General: Well developed, well nourished,male appearing in no acute distress. Head: Normocephalic, atraumatic, sclera non-icteric, no xanthomas, nares are without discharge.  Neck: No carotid bruits. JVD not elevated.  Lungs: Respirations regular and unlabored, without wheezes or rales.  Heart: ***Regular rate and rhythm. No S3 or S4.  No murmur, no rubs, or gallops appreciated. Abdomen: Soft, non-tender, non-distended with normoactive bowel sounds. No hepatomegaly. No rebound/guarding. No obvious abdominal masses. Msk:  Strength and tone appear normal for age. No joint deformities or effusions. Extremities: No clubbing or cyanosis. No edema.  Distal pedal pulses are 2+ bilaterally. Neuro: Alert and oriented X 3. Moves all extremities spontaneously. No focal deficits noted. Psych:  Responds to questions appropriately with a normal affect. Skin: No rashes or lesions noted  Wt Readings from Last 3 Encounters:  01/03/19 104 lb 14.4 oz (47.6 kg)  01/01/19 104 lb (47.2 kg)  11/26/18 108 lb 11 oz (49.3 kg)        Studies/Labs Reviewed:   EKG:  EKG is*** ordered today.  The ekg ordered today demonstrates ***  Recent Labs: 11/09/2018: TSH 2.602 11/26/2018: ALT 37 11/29/2018: Hemoglobin 8.0; Platelets 383 12/06/2018: BUN 34; Creatinine, Ser 2.49; Magnesium 1.7; Potassium 3.7; Sodium 134   Lipid  Panel No results found for: CHOL, TRIG, HDL, CHOLHDL, VLDL, LDLCALC, LDLDIRECT  Additional studies/ records that were reviewed today include:  ***  Assessment:    No diagnosis found.   Plan:   In order of problems listed above:  1. ***    Medication Adjustments/Labs and Tests Ordered: Current medicines are reviewed at length with the patient today.  Concerns regarding medicines are outlined above.  Medication changes, Labs and Tests ordered today are listed in the Patient Instructions below. There are no Patient Instructions on file for this visit.   Signed, Erma Heritage, PA-C  01/15/2019 7:34 AM    [facility] [address] [place], [place] [address] Phone: [phone] Fax: [phone]

## 2019-01-15 NOTE — Telephone Encounter (Addendum)
    Patient was initially scheduled for an office visit today in regards to hypotension following his recent admission. This was cancelled due to the coronavirus and him being high risk due to his current lung cancer.  By review of notes, he was hospitalized [facility] from 11/26/2018 to 12/06/2018 for acute renal failure likely secondary to ATN in the setting of the use of Zoledronic Acid, dehydration and NSAID use. Was hypotensive at times during admission and heart rate was variable from the 90's to 120's by review of vitals.  Was continued on Toprol-XL 50 mg daily during admission and at the time of discharge.  I called the patient today and spoke to his fiance. She reports that they have been trying to increase his fluid intake but intake of solid foods remains poor. Was checked at an office visit weeks back per her report and he was hypotensive at 98/58 and HR was in the 130's. They have not checked his vitals since but she does have a BP cuff. Recommended they continue to check this over the next several days and call back with his readings. Of note, she did decrease his Toprol-XL to 25mg  daily approximately 3 weeks ago due to soft BP.   Patient's fianc will call us with his vitals after following for several days. Would reschedule his visit within the next 3 to 4 weeks but can adjust medications in the interim pending BP and HR.   Signed, Erma Heritage, PA-C 01/15/2019, 3:10 PM Pager: [phone]

## 2019-01-15 NOTE — Telephone Encounter (Signed)
[facility] CSW Progress Note  Call to patient/caregiver to assess for needs given current community situation which recommends limited exposure in community.  Spoke w significant other - states they have someone who runs errands for them and they limit their contact w others in community.  No concerns at this time.  Edwyna Shell, LCSW Clinical Social Worker Phone:  [phone]

## 2019-01-17 ENCOUNTER — Encounter (HOSPITAL_COMMUNITY): Payer: Self-pay | Admitting: Hematology

## 2019-01-17 ENCOUNTER — Other Ambulatory Visit: Payer: Self-pay

## 2019-01-17 ENCOUNTER — Other Ambulatory Visit (HOSPITAL_COMMUNITY): Payer: Medicare Other

## 2019-01-17 ENCOUNTER — Encounter (HOSPITAL_COMMUNITY): Payer: Self-pay | Admitting: Lab

## 2019-01-17 ENCOUNTER — Inpatient Hospital Stay (HOSPITAL_COMMUNITY): Payer: Medicare Other

## 2019-01-17 ENCOUNTER — Inpatient Hospital Stay (HOSPITAL_BASED_OUTPATIENT_CLINIC_OR_DEPARTMENT_OTHER): Payer: Medicare Other | Admitting: Hematology

## 2019-01-17 DIAGNOSIS — K219 Gastro-esophageal reflux disease without esophagitis: Secondary | ICD-10-CM

## 2019-01-17 DIAGNOSIS — Z9221 Personal history of antineoplastic chemotherapy: Secondary | ICD-10-CM

## 2019-01-17 DIAGNOSIS — J449 Chronic obstructive pulmonary disease, unspecified: Secondary | ICD-10-CM

## 2019-01-17 DIAGNOSIS — C3411 Malignant neoplasm of upper lobe, right bronchus or lung: Secondary | ICD-10-CM

## 2019-01-17 DIAGNOSIS — E785 Hyperlipidemia, unspecified: Secondary | ICD-10-CM

## 2019-01-17 DIAGNOSIS — C7951 Secondary malignant neoplasm of bone: Secondary | ICD-10-CM | POA: Diagnosis not present

## 2019-01-17 DIAGNOSIS — C61 Malignant neoplasm of prostate: Secondary | ICD-10-CM

## 2019-01-17 DIAGNOSIS — Z79899 Other long term (current) drug therapy: Secondary | ICD-10-CM | POA: Diagnosis not present

## 2019-01-17 DIAGNOSIS — I2510 Atherosclerotic heart disease of native coronary artery without angina pectoris: Secondary | ICD-10-CM

## 2019-01-17 DIAGNOSIS — F17210 Nicotine dependence, cigarettes, uncomplicated: Secondary | ICD-10-CM

## 2019-01-17 DIAGNOSIS — L89159 Pressure ulcer of sacral region, unspecified stage: Secondary | ICD-10-CM

## 2019-01-17 DIAGNOSIS — E119 Type 2 diabetes mellitus without complications: Secondary | ICD-10-CM

## 2019-01-17 DIAGNOSIS — R630 Anorexia: Secondary | ICD-10-CM

## 2019-01-17 DIAGNOSIS — I10 Essential (primary) hypertension: Secondary | ICD-10-CM

## 2019-01-17 DIAGNOSIS — G893 Neoplasm related pain (acute) (chronic): Secondary | ICD-10-CM

## 2019-01-17 DIAGNOSIS — I739 Peripheral vascular disease, unspecified: Secondary | ICD-10-CM

## 2019-01-17 DIAGNOSIS — C3491 Malignant neoplasm of unspecified part of right bronchus or lung: Secondary | ICD-10-CM

## 2019-01-17 DIAGNOSIS — Z923 Personal history of irradiation: Secondary | ICD-10-CM

## 2019-01-17 LAB — CBC WITH DIFFERENTIAL/PLATELET
ABS IMMATURE GRANULOCYTES: 0.13 10*3/uL — AB (ref 0.00–0.07)
Basophils Absolute: 0.1 10*3/uL (ref 0.0–0.1)
Basophils Relative: 1 %
Eosinophils Absolute: 0.6 10*3/uL — ABNORMAL HIGH (ref 0.0–0.5)
Eosinophils Relative: 3 %
HCT: 31.9 % — ABNORMAL LOW (ref 39.0–52.0)
Hemoglobin: 9.6 g/dL — ABNORMAL LOW (ref 13.0–17.0)
Immature Granulocytes: 1 %
Lymphocytes Relative: 5 %
Lymphs Abs: 1 10*3/uL (ref 0.7–4.0)
MCH: 26.4 pg (ref 26.0–34.0)
MCHC: 30.1 g/dL (ref 30.0–36.0)
MCV: 87.9 fL (ref 80.0–100.0)
Monocytes Absolute: 1.7 10*3/uL — ABNORMAL HIGH (ref 0.1–1.0)
Monocytes Relative: 9 %
Neutro Abs: 15.8 10*3/uL — ABNORMAL HIGH (ref 1.7–7.7)
Neutrophils Relative %: 81 %
Platelets: 718 10*3/uL — ABNORMAL HIGH (ref 150–400)
RBC: 3.63 MIL/uL — ABNORMAL LOW (ref 4.22–5.81)
RDW: 17.8 % — ABNORMAL HIGH (ref 11.5–15.5)
WBC: 19.3 10*3/uL — ABNORMAL HIGH (ref 4.0–10.5)
nRBC: 0 % (ref 0.0–0.2)

## 2019-01-17 LAB — COMPREHENSIVE METABOLIC PANEL
ALT: 119 U/L — ABNORMAL HIGH (ref 0–44)
AST: 93 U/L — AB (ref 15–41)
Albumin: 3.2 g/dL — ABNORMAL LOW (ref 3.5–5.0)
Alkaline Phosphatase: 316 U/L — ABNORMAL HIGH (ref 38–126)
Anion gap: 16 — ABNORMAL HIGH (ref 5–15)
BUN: 19 mg/dL (ref 8–23)
CO2: 22 mmol/L (ref 22–32)
Calcium: 10.5 mg/dL — ABNORMAL HIGH (ref 8.9–10.3)
Chloride: 94 mmol/L — ABNORMAL LOW (ref 98–111)
Creatinine, Ser: 0.58 mg/dL — ABNORMAL LOW (ref 0.61–1.24)
GFR calc Af Amer: >60 mL/min (ref >60)
GFR calc non Af Amer: >60 mL/min (ref >60)
Glucose, Bld: 189 mg/dL — ABNORMAL HIGH (ref 70–99)
POTASSIUM: 4.1 mmol/L (ref 3.5–5.1)
Sodium: 132 mmol/L — ABNORMAL LOW (ref 135–145)
Total Bilirubin: 0.2 mg/dL — ABNORMAL LOW (ref 0.3–1.2)
Total Protein: 10.2 g/dL — ABNORMAL HIGH (ref 6.5–8.1)

## 2019-01-17 MED ORDER — HEPARIN SOD (PORK) LOCK FLUSH 100 UNIT/ML IV SOLN
500.0000 [IU] | Freq: Once | INTRAVENOUS | Status: AC
  Administered 2019-01-17: 500 [IU] via INTRAVENOUS
  Filled 2019-01-17: qty 5

## 2019-01-17 MED ORDER — ZOLEDRONIC ACID 4 MG/100ML IV SOLN
4.0000 mg | Freq: Once | INTRAVENOUS | Status: AC
  Administered 2019-01-17: 4 mg via INTRAVENOUS
  Filled 2019-01-17: qty 100

## 2019-01-17 MED ORDER — HYDROMORPHONE HCL 1 MG/ML IJ SOLN
2.0000 mg | Freq: Once | INTRAMUSCULAR | Status: AC
  Administered 2019-01-17: 2 mg via INTRAVENOUS
  Filled 2019-01-17: qty 2

## 2019-01-17 MED ORDER — SODIUM CHLORIDE 0.9% FLUSH: 10.0000 mL | INTRAVENOUS | Status: DC | PRN

## 2019-01-17 MED ORDER — SODIUM CHLORIDE 0.9 % IV SOLN
INTRAVENOUS | Status: DC
  Administered 2019-01-17: 11:00:00 via INTRAVENOUS

## 2019-01-17 MED ORDER — HYDROMORPHONE HCL 1 MG/ML IJ SOLN
INTRAMUSCULAR | Status: AC
  Filled 2019-01-17: qty 2

## 2019-01-17 NOTE — Progress Notes (Unsigned)
Referral sent to [facility].  Records faxed on 3/19

## 2019-01-17 NOTE — Progress Notes (Signed)
zometa and hydration fluids given today per orders.   Treatment given per orders. Patient tolerated it well without problems. Vitals stable and discharged home from clinic via wheelchair. Follow up as scheduled.

## 2019-01-17 NOTE — Progress Notes (Signed)
Patient is going to hospice but I received orders from Dr. Delton Coombes to administer normal saline 500 ml / hour for 2 hours total of 102ml.  Dilaudid 2 mg iv x 1 dose now and may repeat for severe pain.

## 2019-01-17 NOTE — Progress Notes (Signed)
Patient and caregiver unable to relay medications since discharge from the hospital.

## 2019-01-17 NOTE — Assessment & Plan Note (Signed)
1.  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 -CT scan on 04/19/2018 showed regression of the tumor which is sub-solid in appearance with no new areas.  - Consolidation durvalumab started on 05/03/2018 until 09/19/2018 with CT scan on 07/31/2018 showing slight interval worsening of destruction of the posterior lateral right second and third ribs.  There is also slight worsening by few millimeters of right apical lung mass. - PET CT scan on 08/30/2018 shows peripheral right apical tumor with rib and chest wall invasion, progressive compared to prior PET scan, with increased chest wall invasion.  Right paratracheal/mediastinal lymph nodes demonstrate increased hypermetabolism.  - 1 dose of palliative radiation on 10/16/2018  -Cycle 1 of gemcitabine and carboplatin on 10/19/2018. -Cycle 2 of gemcitabine and carboplatin on 11/09/2018.  He missed day 8 of gemcitabine. - He was hospitalized from 11/26/2018 through 12/06/2018 with acute renal failure.  He was subsequently discharged to rehab.  He is now living with his sister for the past 1 week.  His last creatinine was 2.5. - I reviewed results of the PET CT scan dated 01/14/2019 which shows new hypermetabolic soft tissue in the left paraspinal region at T3-T4 level.  There is associated new hypermetabolic bony involvement at the same level. -His functional status is also declining.  He is not able to walk much.  He is not eating much of any solid foods.  He is drinking about 4-5 ensures per day.  He is continuing to lose weight. - Because of his declining performance status and rapid progression on chemotherapy, I have recommended best supportive care in the form of hospice.  Patient and his family is agreeable.  We will make a referral.  2.  Cancer related pain: -He is wearing fentanyl 50 mcg patch. -He is taking Dilaudid 4 mg every 6 hours.  3.   Hypercalcemia: -His calcium level is 10.5 today with an albumin of 3.2. -He will receive hydration and zoledronic acid.

## 2019-01-17 NOTE — Patient Instructions (Addendum)
[place] at [facility] Discharge Instructions  You were seen today by Dr. Delton Coombes. He went over your recent lab and scan results, the scan shows that your cancer has grown, and your labs aren't good. He would like you to have IV fluids and Zometa today to help with your labs. He discussed Hospice with you today and recommends that you accept.   Thank you for choosing [place] at [facility] to provide your oncology and hematology care.  To afford each patient quality time with our provider, please arrive at least 15 minutes before your scheduled appointment time.   If you have a lab appointment with the [facility] please come in thru the  Main Entrance and check in at the main information desk  You need to re-schedule your appointment should you arrive 10 or more minutes late.  We strive to give you quality time with our providers, and arriving late affects you and other patients whose appointments are after yours.  Also, if you no show three or more times for appointments you may be dismissed from the clinic at the providers discretion.     Again, thank you for choosing [facility].  Our hope is that these requests will decrease the amount of time that you wait before being seen by our physicians.       _____________________________________________________________  Should you have questions after your visit to [facility], please contact our office at [phone] between the hours of 8:00 a.m. and 4:30 p.m.  Voicemails left after 4:00 p.m. will not be returned until the following business day.  For prescription refill requests, have your pharmacy contact our office and allow 72 hours.    Cancer Center Support Programs:   > Cancer Support Group  2nd Tuesday of the month 1pm-2pm, Journey Room

## 2019-01-17 NOTE — Progress Notes (Signed)
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 There is early bony invasion of the right second rib and cortical destruction indicating local rib/chest wall invasion by tumor. 2. There is additional pleural thickening and pleural calcification along the right posterior pleural surface which is not separately hypermetabolic. 3. Small mediastinal lymph nodes are not appreciably hypermetabolic. 4. Multifocal and some diffuse regions of high activity in the bowel are likely physiologic. 5. No findings of metastatic disease to the neck, abdomen/pelvis, or to the included remainder of the skeleton. 6. Other imaging findings of potential clinical significance: Chronic right maxillary sinusitis. Aortic Atherosclerosis (ICD10-I70.0) and Emphysema (ICD10-J43.9). Chronic calcific pancreatitis.     01/15/2018 Initial Diagnosis    Squamous cell carcinoma of right lung ([place])    01/15/2018 Initial Biopsy    Lung needle core biopsy RUL: Squamous cell carcinoma    02/19/2018 Procedure    Port-a-cath placement by Dr. Arnoldo Morale     02/20/2018 -  Chemotherapy    The patient had palonosetron (ALOXI) injection 0.25 mg, 0.25 mg, Intravenous,  Once, 1 of 4 cycles Administration: 0.25 mg (02/20/2018) CARBOplatin (PARAPLATIN) 230 mg in sodium chloride 0.9 % 250 mL chemo infusion, 230 mg (100 % of original dose 225.8 mg), Intravenous,  Once, 1 of 4 cycles  Dose modification:   (original dose 225.8 mg, Cycle 1) PACLitaxel (TAXOL) 78 mg in dextrose 5 % 250 mL chemo infusion (</= 80mg /m2), 45 mg/m2 = 78 mg, Intravenous,  Once, 1 of 4 cycles Administration: 78 mg (02/20/2018)  for chemotherapy treatment.     05/06/2018 - 10/03/2018 Chemotherapy    The patient had durvalumab (IMFINZI) 620 mg in sodium chloride 0.9 % 100 mL chemo infusion, 10 mg/kg = 620 mg, Intravenous,  Once, 10 of 12 cycles Administration: 620 mg (05/11/2018), 620 mg (05/25/2018), 620 mg (06/08/2018), 620 mg (06/22/2018), 620 mg (07/06/2018), 620 mg (07/20/2018), 620 mg (08/08/2018), 620 mg (08/22/2018), 620 mg (09/05/2018), 620 mg (09/19/2018)  for chemotherapy treatment.     10/19/2018 -  Chemotherapy    The patient had palonosetron (ALOXI) injection 0.25 mg, 0.25 mg, Intravenous,  Once, 2 of 4 cycles Administration: 0.25 mg (10/19/2018), 0.25 mg (11/09/2018) ondansetron (ZOFRAN) 8 mg in sodium chloride 0.9 % 50 mL IVPB, 8 mg (100 % of original dose 8 mg), Intravenous,  Once, 1 of 3 cycles Dose modification: 8 mg (original dose 8 mg, Cycle 1) Administration: 8 mg (10/26/2018) CARBOplatin (PARAPLATIN) 500 mg in sodium chloride 0.9 % 250 mL chemo infusion, 500 mg (100 % of original dose 501 mg), Intravenous,  Once, 2 of 4 cycles Dose modification:   (original dose 501 mg, Cycle 1),   (original dose 501 mg, Cycle 2) Administration: 500 mg (10/19/2018), 500 mg (11/09/2018) gemcitabine (GEMZAR) 1,634 mg in sodium chloride 0.9 % 250 mL chemo infusion, 1,000 mg/m2 = 1,634 mg, Intravenous,  Once, 2 of 4 cycles Administration: 1,634 mg (10/19/2018), 1,634 mg (10/26/2018), 1,634 mg (11/09/2018)  for chemotherapy  treatment.      Adenocarcinoma of prostate ([place])   10/24/2015 Tumor Marker    PSA 1.53    01/06/2016 Tumor Marker    PSA 1.6    07/31/2017 Tumor Marker    PSA 4.6    01/17/2018 Initial Diagnosis    Adenocarcinoma of prostate ([place])    01/17/2018 Procedure    12-core prostate biopsy  revealed 6/12 cores positive for prostate adenocarcinoma (Dr. [facility]).  (L) base lat: Gleason 9 (5+4), 20%. (L) mid lat: Gleason 9 (4+5), 40%. (L) apex lat: Gleason 9 (4+5), 20%. (L) base: Gleason 9 (5+4), 20%. (L) mid: Gleason 10 (5+5), 5%. (L) apex: Gleason 9 (5+4), 9%.        CANCER STAGING: Cancer Staging No matching staging information was found for the patient.   INTERVAL HISTORY:  Mr. Cabreja 64 y.o. male returns for routine follow-up and consideration for next cycle of chemotherapy. He is here today with his wife. He states that his pain is terrible and he gets no relief. He states that he is tired all the time and weak. Denies any nausea, vomiting, or diarrhea. Denies any new pains. Had not noticed any recent bleeding such as epistaxis, hematuria or hematochezia. Denies recent chest pain on exertion, shortness of breath on minimal exertion, pre-syncopal episodes, or palpitations. Denies any numbness or tingling in hands or feet. Denies any recent fevers, infections, or recent hospitalizations. Patient reports appetite at 25% and energy level at 25%.      REVIEW OF SYSTEMS:  Review of Systems  Constitutional: Positive for fatigue.     PAST MEDICAL/SURGICAL HISTORY:  Past Medical History:  Diagnosis Date  . Bursitis   . CAD (coronary artery disease)     s/p stent placement unknown artery 12/2014,  . COPD (chronic obstructive pulmonary disease) ([place])   . Diabetes mellitus ([place])   . GERD (gastroesophageal reflux disease)   . History of prostate cancer   . Hyperlipemia   . Hypertension   . Lung cancer ([place]) 12/2017   still having chemo - last was 2 weeks ago  . Peripheral arterial disease ([place])    a. h/o stents. b. 03/2016  - successful PTA and covered stenting using overlapping Viabahn covered stents of a long segment in-stent restenosis of previously placed nitinol self expanding stents in the proximal mid and distal right SFA.  . Tobacco abuse    Past  Surgical History:  Procedure Laterality Date  . COLONOSCOPY WITH PROPOFOL N/A 11/22/2016   Procedure: COLONOSCOPY WITH PROPOFOL;  Surgeon: [name], MD;  Location: [facility];  Service: Endoscopy;  Laterality: N/A;  10:00 am  . CORONARY STENT PLACEMENT  12-2014   [facility] [employer]  . FEMORAL ARTERY STENT Bilateral 03/2013  . PERIPHERAL VASCULAR CATHETERIZATION N/A 04/04/2016   Procedure: Lower Extremity Angiography;  Surgeon: Lorretta Harp, MD;  Location: [place] CV LAB;  Service: Cardiovascular;  Laterality: N/A;  . PERIPHERAL VASCULAR CATHETERIZATION N/A 04/04/2016   Procedure: Abdominal Aortogram;  Surgeon: Lorretta Harp, MD;  Location: [facility] CV LAB;  Service: Cardiovascular;  Laterality: N/A;  . PERIPHERAL VASCULAR CATHETERIZATION  04/04/2016   Procedure: Peripheral Vascular Intervention;  Surgeon: Lorretta Harp, MD;  Location: [place] CV LAB;  Service: Cardiovascular;;  rt SFA stent  . POLYPECTOMY  11/22/2016   Procedure: POLYPECTOMY;  Surgeon: [name], MD;  Location: [facility];  Service: Endoscopy;;  descending colon polyp, transverse colon polyp, sigmoid colon polyp, rectal  polyp  . PORTACATH PLACEMENT Left 02/19/2018   Procedure: INSERTION PORT-A-CATH;  Surgeon: Aviva Signs, MD;  Location: [facility] ORS;  Service: General;  Laterality: Left;  . STENT PLACEMENT VASCULAR ([place] [facility])  2013     SOCIAL HISTORY:  Social History   Socioeconomic History  . Marital status: Single    Spouse name: Not on file  . Number of children: Not on file  . Years of education: Not on file  . Highest education level: Not on file  Occupational History  . Occupation: unemployed  Social Needs  . Financial resource strain: Not on file  . Food insecurity:    Worry: Not on file    Inability: Not on file  . Transportation needs:    Medical: Not on file    Non-medical: Not on file  Tobacco Use  . Smoking status: Current Every Day Smoker    Packs/day: 0.25     Years: 45.00    Pack years: 11.25    Types: Cigarettes    Start date: 02/12/1971  . Smokeless tobacco: Never Used  Substance and Sexual Activity  . Alcohol use: Not Currently    Alcohol/week: 0.0 standard drinks  . Drug use: No  . Sexual activity: Yes  Lifestyle  . Physical activity:    Days per week: Not on file    Minutes per session: Not on file  . Stress: Not on file  Relationships  . Social connections:    Talks on phone: Not on file    Gets together: Not on file    Attends religious service: Not on file    Active member of club or organization: Not on file    Attends meetings of clubs or organizations: Not on file    Relationship status: Not on file  . Intimate partner violence:    Fear of current or ex partner: Not on file    Emotionally abused: Not on file    Physically abused: Not on file    Forced sexual activity: Not on file  Other Topics Concern  . Not on file  Social History Narrative  . Not on file    FAMILY HISTORY:  Family History  Problem Relation Age of Onset  . Diabetes Mother   . Heart disease Mother   . Hypertension Mother   . Healthy Brother   . Other Brother        accident   . Arthritis Sister   . Arthritis Sister   . SIDS Brother   . Colon cancer Neg Hx     CURRENT MEDICATIONS:  Outpatient Encounter Medications as of 01/17/2019  Medication Sig  . acetaminophen (TYLENOL) 325 MG tablet TK 2 TS PO Q 4 H PRF PAIN/FEVER  . albuterol (PROAIR HFA) 108 (90 Base) MCG/ACT inhaler Inhale 1-2 puffs into the lungs every 6 (six) hours as needed for wheezing or shortness of breath.  [name] aspirin 81 MG chewable tablet Chew 81 mg by mouth every morning.   . ASPIRIN LOW DOSE 81 MG EC tablet TK 1 T PO  QD  . atorvastatin (LIPITOR) 80 MG tablet Take 80 mg by mouth at bedtime.   . calcium carbonate (OS-CAL - DOSED IN MG OF ELEMENTAL CALCIUM) 1250 (500 Ca) MG tablet Take 1 tablet (500 mg of elemental calcium total) by mouth 3 (three) times daily with meals.   . CARBOPLATIN IV Inject into the vein every 21 ( twenty-one) days.   . cephALEXin (KEFLEX) 500 MG capsule Take 1 capsule (  500 mg total) by mouth 2 (two) times daily.  . cilostazol (PLETAL) 50 MG tablet TAKE 1 TABLET(50 MG) BY MOUTH TWICE DAILY (Patient taking differently: Take 50 mg by mouth 2 (two) times daily. )  . Coenzyme Q10 100 MG capsule Take 100 mg by mouth at bedtime.   . Diphenhyd-Hydrocort-Nystatin (FIRST-DUKES MOUTHWASH) SUSP Use as directed 5 mLs in the mouth or throat 4 (four) times daily -  before meals and at bedtime.  . feeding supplement, ENSURE ENLIVE, (ENSURE ENLIVE) LIQD Take 237 mLs by mouth 4 (four) times daily.  . fentaNYL (DURAGESIC) 50 MCG/HR Place 1 patch onto the skin every 3 (three) days.  . fluticasone (FLONASE) 50 MCG/ACT nasal spray Place 1 spray into both nostrils every evening.   . Gemcitabine HCl (GEMZAR IV) Inject into the vein. Day 1 day 8 every 21 days.  [name] glipiZIDE (GLUCOTROL XL) 2.5 MG 24 hr tablet TK 1 T PO QD  . glipiZIDE (GLUCOTROL) 5 MG tablet Take 0.5 tablets (2.5 mg total) by mouth daily.  [name] HYDROmorphone (DILAUDID) 2 MG tablet Take 2 tablets (4 mg total) by mouth every 6 (six) hours as needed (break through pain).  [name] ipratropium-albuterol (DUONEB) 0.5-2.5 (3) MG/3ML SOLN Take 3 mLs by nebulization every 6 (six) hours as needed (for shortness of breath).  . lidocaine-prilocaine (EMLA) cream Apply a quarter size amount to affected area 1 hour prior to coming to chemotherapy.  Do not rub in.  Cover with plastic wrap.  . magnesium oxide (MAG-OX) 400 (241.3 Mg) MG tablet Take 2 tablets (800 mg total) by mouth 2 (two) times daily.  . metoprolol succinate (TOPROL-XL) 50 MG 24 hr tablet Take 50 mg by mouth daily.   . mirtazapine (REMERON) 7.5 MG tablet Take 1 tablet (7.5 mg total) by mouth at bedtime.  [name] MUCUS RELIEF 600 MG 12 hr tablet TK 1 T PO  BID FOR SEASONAL ALLERGIES FOR 7 DAYS  . nicotine (NICODERM CQ - DOSED IN MG/24 HOURS) 14 mg/24hr patch Place 1  patch (14 mg total) onto the skin daily.  [name] nystatin (MYCOSTATIN) 100000 UNIT/ML suspension   . Omega-3 Fatty Acids (FISH OIL) 1000 MG CAPS Take 1 capsule by mouth at bedtime.   . ondansetron (ZOFRAN) 8 MG tablet Take 1 tablet (8 mg total) by mouth every 8 (eight) hours as needed for nausea or vomiting.  . ondansetron (ZOFRAN-ODT) 8 MG disintegrating tablet DIS ONE T PO  Q 8 H PRF NAUSEA  . pantoprazole (PROTONIX) 40 MG tablet Take 40 mg by mouth 2 (two) times daily.  . potassium chloride (K-DUR) 10 MEQ tablet Take 2 tablets (20 mEq total) by mouth daily.  . prochlorperazine (COMPAZINE) 10 MG tablet Take 1 tablet (10 mg total) by mouth every 6 (six) hours as needed for nausea or vomiting.  . sorbitol 70 % SOLN Take 30 mLs by mouth as needed for moderate constipation.  . triamcinolone cream (KENALOG) 0.1 % Apply topically 2 (two) times daily.  . [EXPIRED] Zoledronic Acid (ZOMETA) 4 mg IVPB    No facility-administered encounter medications on file as of 01/17/2019.     ALLERGIES:  Allergies  Allergen Reactions  . Lisinopril Anaphylaxis  . Penicillins Anaphylaxis    Has patient had a PCN reaction causing immediate rash, facial/tongue/throat swelling, SOB or lightheadedness with hypotension:Yes Has patient had a PCN reaction causing severe rash involving mucus membranes or skin necrosis:unsure Has patient had a PCN reaction that required hospitalization:unsure Has patient had a PCN reaction  occurring within the last 10 years:~10 years per patient If all of the above answers are "NO", then may proceed with Cephalosporin use.   . Ace Inhibitors Swelling     PHYSICAL EXAM:  ECOG Performance status: 3  Vitals:   01/17/19 0939  BP: 95/64  Pulse: (!) 120  Resp: 20  Temp: 98 F (36.7 C)  SpO2: 100%   Filed Weights   01/17/19 0939  Weight: 102 lb 9.6 oz (46.5 kg)    Physical Exam Constitutional:      Appearance: Normal appearance.  Cardiovascular:     Rate and Rhythm: Normal  rate and regular rhythm.  Pulmonary:     Effort: Pulmonary effort is normal.     Breath sounds: Normal breath sounds.  Abdominal:     General: There is no distension.     Palpations: Abdomen is soft.  Musculoskeletal:     Left lower leg: No edema.  Skin:    General: Skin is warm.  Neurological:     General: No focal deficit present.     Mental Status: He is alert and oriented to person, place, and time.  Psychiatric:        Mood and Affect: Mood normal.        Behavior: Behavior normal.      LABORATORY DATA:  I have reviewed the labs as listed.  CBC    Component Value Date/Time   WBC 19.3 (H) 01/17/2019 0940   RBC 3.63 (L) 01/17/2019 0940   HGB 9.6 (L) 01/17/2019 0940   HCT 31.9 (L) 01/17/2019 0940   PLT 718 (H) 01/17/2019 0940   MCV 87.9 01/17/2019 0940   MCH 26.4 01/17/2019 0940   MCHC 30.1 01/17/2019 0940   RDW 17.8 (H) 01/17/2019 0940   LYMPHSABS 1.0 01/17/2019 0940   MONOABS 1.7 (H) 01/17/2019 0940   EOSABS 0.6 (H) 01/17/2019 0940   BASOSABS 0.1 01/17/2019 0940   CMP Latest Ref Rng & Units 01/17/2019 12/06/2018 12/05/2018  Glucose 70 - 99 mg/dL 189(H) 103(H) 133(H)  BUN 8 - 23 mg/dL 19 34(H) 32(H)  Creatinine 0.61 - 1.24 mg/dL 0.58(L) 2.49(H) 2.55(H)  Sodium 135 - 145 mmol/L 132(L) 134(L) 134(L)  Potassium 3.5 - 5.1 mmol/L 4.1 3.7 2.0(LL)  Chloride 98 - 111 mmol/L 94(L) 112(H) 107  CO2 22 - 32 mmol/L 22 15(L) 16(L)  Calcium 8.9 - 10.3 mg/dL 10.5(H) 7.5(L) 7.1(L)  Total Protein 6.5 - 8.1 g/dL 10.2(H) - -  Total Bilirubin 0.3 - 1.2 mg/dL 0.2(L) - -  Alkaline Phos 38 - 126 U/L 316(H) - -  AST 15 - 41 U/L 93(H) - -  ALT 0 - 44 U/L 119(H) - -       DIAGNOSTIC IMAGING:  I have independently reviewed the scans and discussed with the patient.   I have reviewed Venita Lick LPN's note and agree with the documentation.  I personally performed a face-to-face visit, made revisions and my assessment and plan is as follows.    ASSESSMENT & PLAN:   Squamous cell  carcinoma of right lung ([place]) 1.  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 -CT scan on 04/19/2018 showed regression of the tumor which is sub-solid in appearance with no new areas.  - Consolidation durvalumab started on 05/03/2018 until 09/19/2018 with CT scan on 07/31/2018 showing slight interval worsening of destruction of the posterior lateral right second and third ribs.  There is also slight  worsening by few millimeters of right apical lung mass. - PET CT scan on 08/30/2018 shows peripheral right apical tumor with rib and chest wall invasion, progressive compared to prior PET scan, with increased chest wall invasion.  Right paratracheal/mediastinal lymph nodes demonstrate increased hypermetabolism.  - 1 dose of palliative radiation on 10/16/2018  -Cycle 1 of gemcitabine and carboplatin on 10/19/2018. -Cycle 2 of gemcitabine and carboplatin on 11/09/2018.  He missed day 8 of gemcitabine. - He was hospitalized from 11/26/2018 through 12/06/2018 with acute renal failure.  He was subsequently discharged to rehab.  He is now living with his sister for the past 1 week.  His last creatinine was 2.5. - I reviewed results of the PET CT scan dated 01/14/2019 which shows new hypermetabolic soft tissue in the left paraspinal region at T3-T4 level.  There is associated new hypermetabolic bony involvement at the same level. -His functional status is also declining.  He is not able to walk much.  He is not eating much of any solid foods.  He is drinking about 4-5 ensures per day.  He is continuing to lose weight. - Because of his declining performance status and rapid progression on chemotherapy, I have recommended best supportive care in the form of hospice.  Patient and his family is agreeable.  We will make a referral.  2.  Cancer related pain: -He is wearing fentanyl 50 mcg patch. -He is taking Dilaudid 4 mg  every 6 hours.  3.  Hypercalcemia: -His calcium level is 10.5 today with an albumin of 3.2. -He will receive hydration and zoledronic acid.    Total time spent is 40 minutes with more than 50% of the time spent face-to-face discussing scan results, lab results, hospice recommendation and coordination of care.    Orders placed this encounter:  No orders of the defined types were placed in this encounter.     Derek Jack, MD [place] [phone]

## 2019-01-18 ENCOUNTER — Telehealth (HOSPITAL_COMMUNITY): Payer: Self-pay | Admitting: Emergency Medicine

## 2019-01-18 NOTE — Telephone Encounter (Signed)
/  20/20  spoke with a lady and she said they were currently in the ED... 3/23 isn't a good day so she said he could come on Wed.

## 2019-01-21 ENCOUNTER — Ambulatory Visit (HOSPITAL_COMMUNITY): Payer: Medicare Other

## 2019-01-21 ENCOUNTER — Ambulatory Visit: Payer: Medicare Other | Admitting: Physician Assistant

## 2019-01-22 ENCOUNTER — Telehealth (HOSPITAL_COMMUNITY): Payer: Self-pay

## 2019-01-22 ENCOUNTER — Ambulatory Visit (HOSPITAL_COMMUNITY): Payer: Medicare Other | Admitting: Physical Therapy

## 2019-01-22 NOTE — Telephone Encounter (Signed)
Pt's girlfriend called and stated that hospice was going to start taking care of pt's wounds. Asked if she wanted him to be d/c from PT services here and she said yes, they weren't going to be getting him out of the house.   Geraldine Solar PT, DPT

## 2019-01-23 ENCOUNTER — Encounter (HOSPITAL_COMMUNITY): Payer: Self-pay

## 2019-01-23 ENCOUNTER — Ambulatory Visit (HOSPITAL_COMMUNITY): Payer: Medicare Other

## 2019-01-23 NOTE — Therapy (Signed)
[place], [place], [address] Phone: [phone]   Fax:  [phone]  Patient Details  Name: Fred Hernandez MRN: [id number] Date of Birth: May 28, 1956 Referring Provider:  No ref. provider found  Encounter Date: 01/23/2019   PHYSICAL THERAPY DISCHARGE SUMMARY  Visits from Start of Care: 3  Current functional level related to goals / functional outcomes: Patient has been evaluated and participated in 2 additional treatment sessions for wound care to treat his Lt sacral wound. He cancelled his last appointment on 01/21/2019 and his significant other called on 01/22/2019 to cancel all remaining appointments as he does not feel it is best to be out of his home during this time. The patients girlfriend stated he would be getting wound care at home from hospice now and requested for the patient to be discharged form outpatient wound care at this time.   Remaining deficits: See last treatment note for wound details.   Last measurements: 01/15/2019  Wound Properties Date First Assessed: 01/09/19 Time First Assessed: 1130 Wound Type: Other (Comment) Location: Sacrum Location Orientation: Left Present on Admission: Yes    Dressing Type  None    Dressing Changed  Changed    Dressing Status  None    Dressing Change Frequency  PRN    Site / Wound Assessment  Pink;Red    % Wound base Red or Granulating  85%    % Wound base Yellow/Fibrinous Exudate  15%    % Wound base Other/Granulation Tissue (Comment)  --   pale pink    Peri-wound Assessment  --   darkened tissue around wound for 1cm    Wound Length (cm)  1.8 cm    Wound Width (cm)  1.2 cm    Wound Depth (cm)  0.1 cm    Wound Volume (cm^3)  0.22 cm^3    Wound Surface Area (cm^2)  2.16 cm^2    Margins  Attached edges (approximated)       Education / Equipment: Patient educated to keep pressure off of the wound and keep dressings on. Educated that if the  dressing falls off or become soaked/saturated with drainage to replace with neosporin and bandage but keep wound covered. Patient will need new referral to return to wound care.   Plan: Patient agrees to discharge.  Patient goals were not met. Patient is being discharged due to the patient's request.  ?????     Kipp Brood, PT, DPT, [facility] Physical Therapist with [facility]  01/23/2019 12:04 PM    [place], [place], [address] Phone: [phone]   Fax:  [phone]

## 2019-01-24 ENCOUNTER — Ambulatory Visit (HOSPITAL_COMMUNITY): Payer: Medicare Other

## 2019-01-28 ENCOUNTER — Ambulatory Visit (HOSPITAL_COMMUNITY): Payer: Medicare Other

## 2019-01-29 ENCOUNTER — Ambulatory Visit (HOSPITAL_COMMUNITY): Payer: Medicare Other | Admitting: Physical Therapy

## 2019-01-30 ENCOUNTER — Ambulatory Visit (HOSPITAL_COMMUNITY): Payer: Medicare Other

## 2019-01-31 ENCOUNTER — Ambulatory Visit (HOSPITAL_COMMUNITY): Payer: Medicare Other | Admitting: Physical Therapy

## 2019-02-05 ENCOUNTER — Ambulatory Visit (HOSPITAL_COMMUNITY): Payer: Medicare Other | Admitting: Physical Therapy

## 2019-02-07 ENCOUNTER — Ambulatory Visit (HOSPITAL_COMMUNITY): Payer: Medicare Other | Admitting: Physical Therapy

## 2019-02-07 ENCOUNTER — Ambulatory Visit: Payer: Medicare Other | Admitting: Cardiovascular Disease

## 2019-02-12 ENCOUNTER — Ambulatory Visit (HOSPITAL_COMMUNITY): Payer: Medicare Other | Admitting: Physical Therapy

## 2019-02-19 ENCOUNTER — Ambulatory Visit: Payer: Medicare Other | Admitting: Student

## 2019-04-01 DEATH — deceased

## 2019-04-29 ENCOUNTER — Encounter (HOSPITAL_COMMUNITY): Payer: Self-pay | Admitting: *Deleted

## 2019-04-29 NOTE — Progress Notes (Signed)
Patient's significant other called me today to talk. She states that Fred Hernandez passed away on 04/17/23 peacefully at 2 am that morning.

## 2019-10-09 IMAGING — CT NM PET TUM IMG INITIAL (PI) SKULL BASE T - THIGH
8 series · 25 of 25 positions shown · non-contrast
Comparison: CT chest dated 11/20/2017

CLINICAL DATA: Initial treatment strategy for right apical lung
mass.

EXAM:
NUCLEAR MEDICINE PET SKULL BASE TO THIGH
TECHNIQUE: 7.8 mCi F-18 FDG was injected intravenously. Full-ring PET imaging
was performed from the skull base to thigh after the radiotracer. CT
data was obtained and used for attenuation correction and anatomic
localization.
FASTING BLOOD GLUCOSE:  Value: 118 mg/dl

[Series 3: pet sk_thigh ac · axial · 5.0mm · 4.07mm/px · z∈[-1435,-523]mm · 4 of 229 slices shown]
[im 1/229]
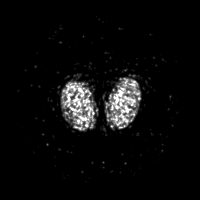
[im 77/229]
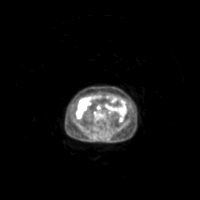
[im 153/229]
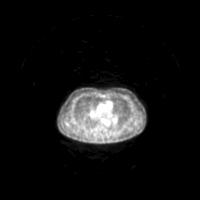
[im 229/229]
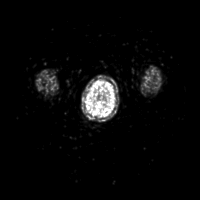

[Series 4: ct sk_thigh 5.0 b31f · axial · 5.0mm · 0.98mm/px · z∈[-1435,-523]mm · 5 of 229 slices shown]
[im 1/229]
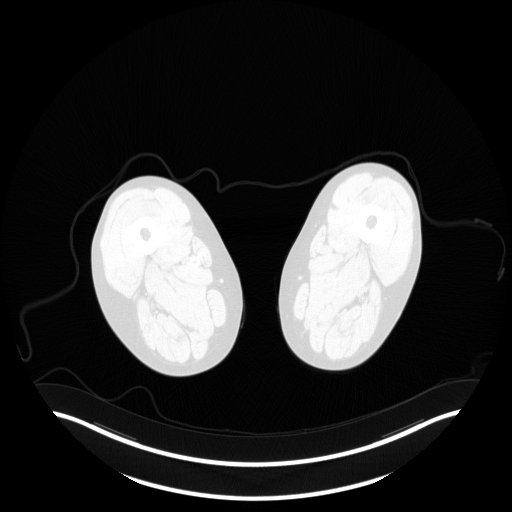
[im 58/229]
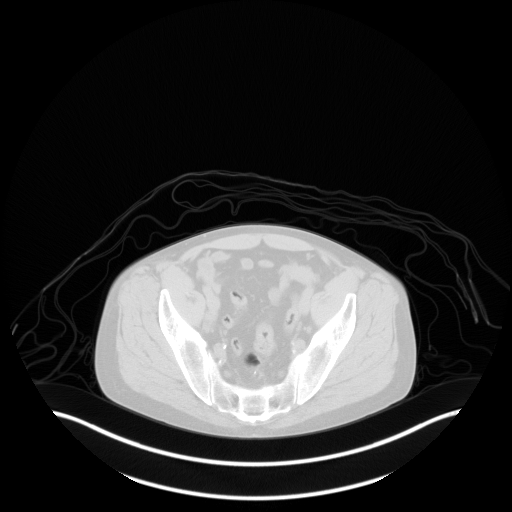
[im 115/229]
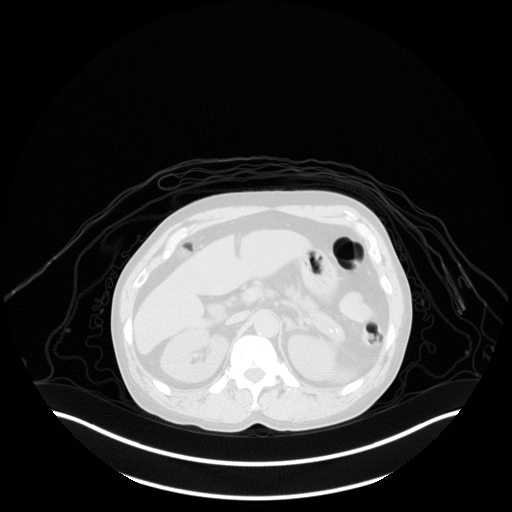
[im 172/229]
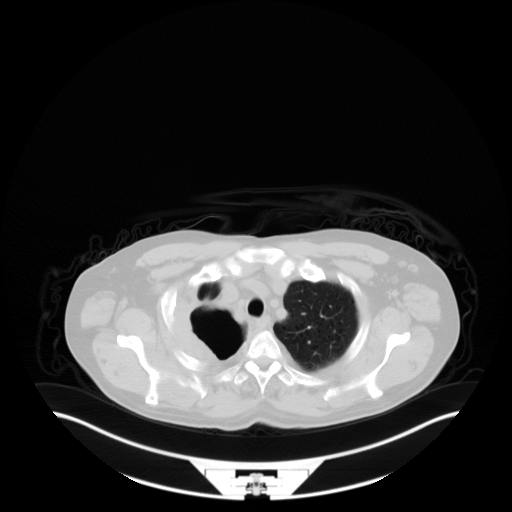
[im 229/229  brain]
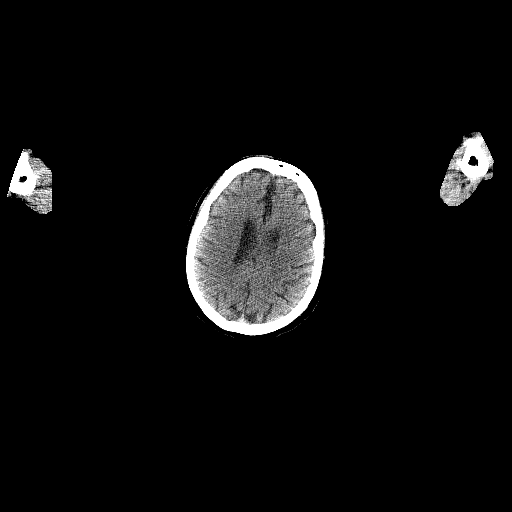

[Series 5: pet sk_thigh nac · axial · 5.0mm · 4.07mm/px · z∈[-1435,-523]mm · 5 of 229 slices shown]
[im 1/229]
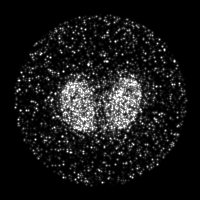
[im 58/229]
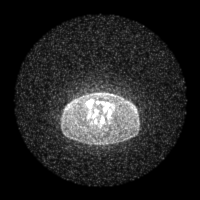
[im 115/229]
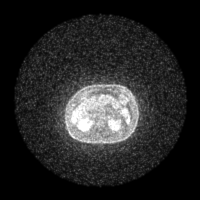
[im 172/229]
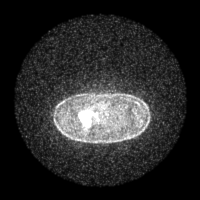
[im 229/229]
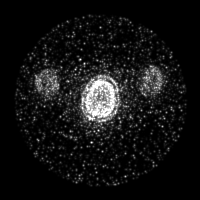

[Series 8: ct sk_thigh 5.0 b70f (id)_bone · axial · 5.0mm · 0.75mm/px · z∈[-961,-677]mm · 2 of 72 slices shown]
[im 1/72  bone]
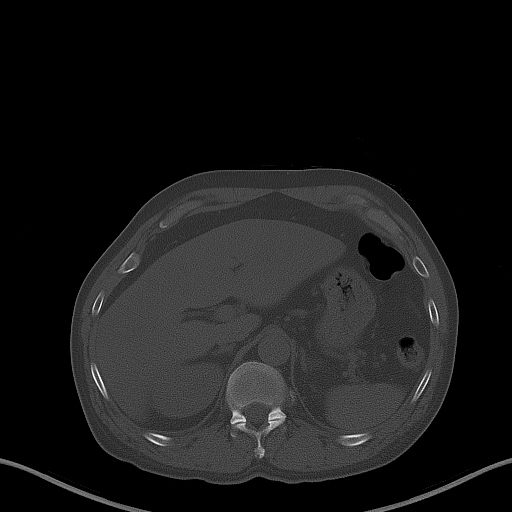
[im 72/72  bone]
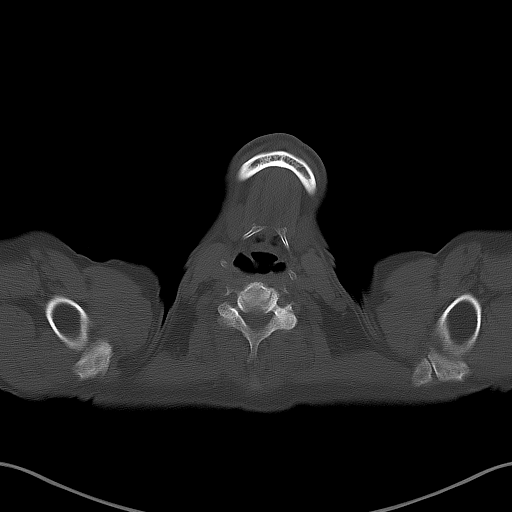

[Series 603: mip range 3 · coronal · 1.89mm/px · 1 of 32 slices shown]
[im 1/32]
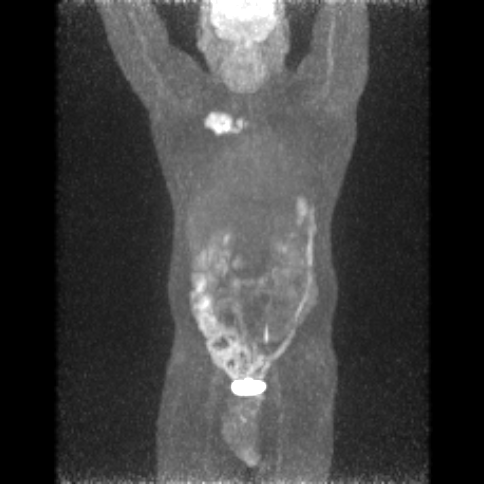

[Series 604: range-ct sk_thigh 5.0 (id)<alpha range> · 2 of 95 slices shown (1 of 2)]
[im 1/95]
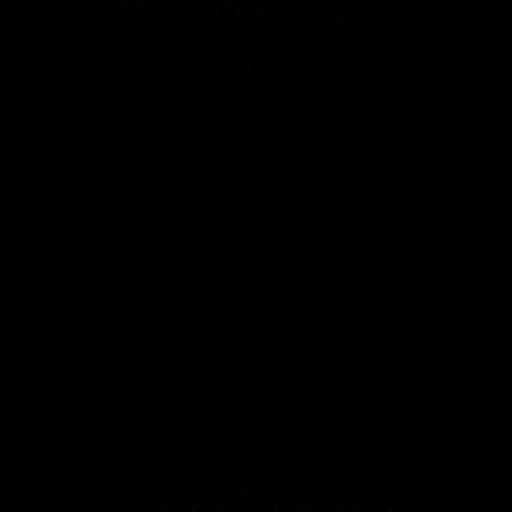
[im 95/95]
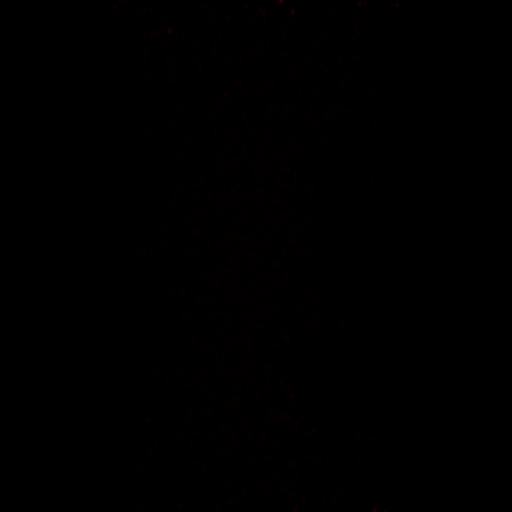

[Series 605: range-ct sk_thigh 5.0 (id)<alpha range> · 5 of 216 slices shown (2 of 2)]
[im 1/216]
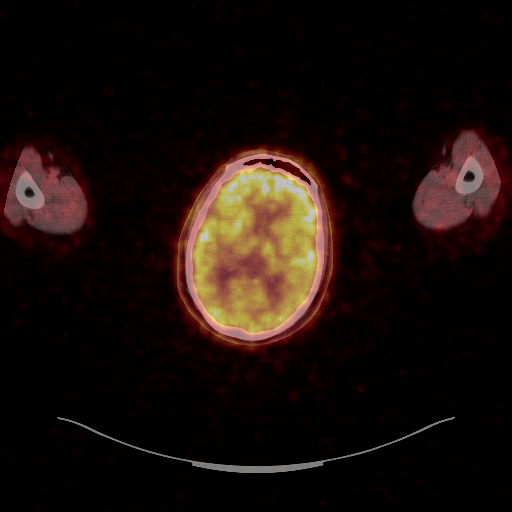
[im 54/216]
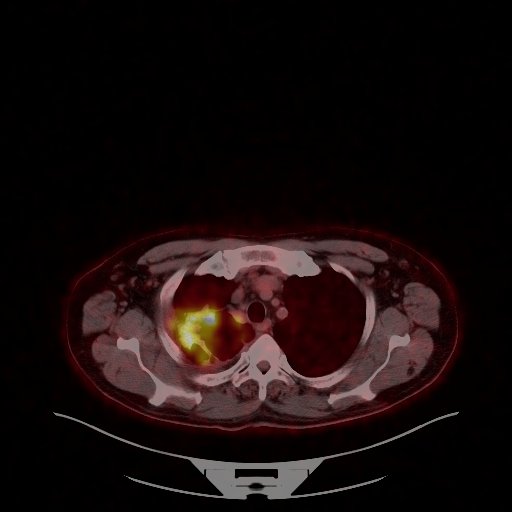
[im 108/216]
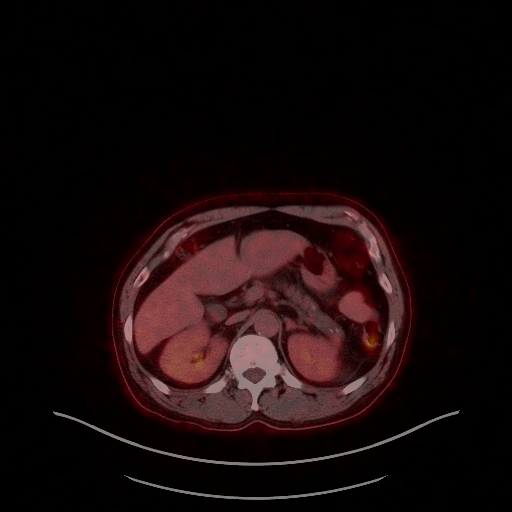
[im 162/216]
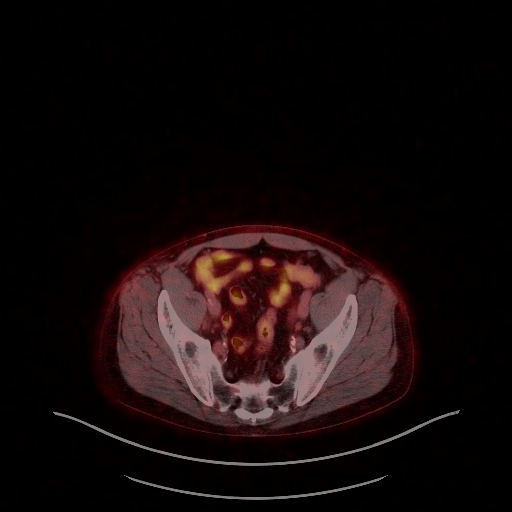
[im 216/216]
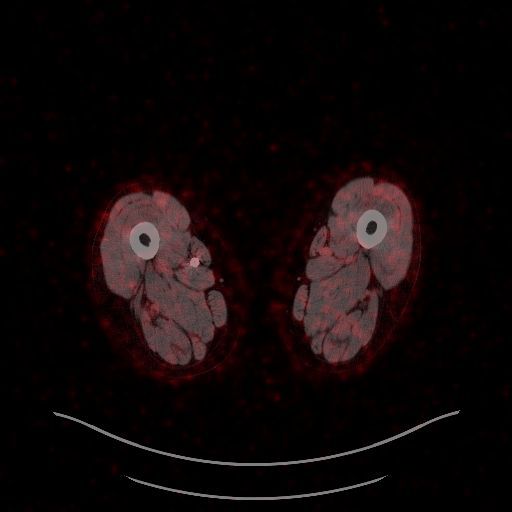

[Series 1069: results mm oncology reading · 5.0mm · 0.68mm/px · 1 of 4 slices shown]
[im 1/4]
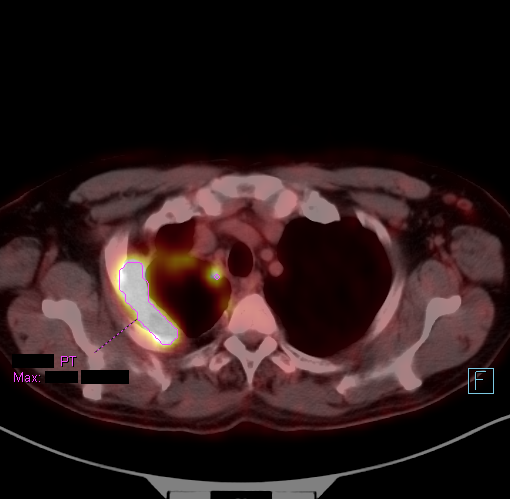

[25 of 25 positions shown; findings below may reference images not displayed]

FINDINGS: NECK

No hypermetabolic lymph nodes in the neck. Physiologic activity
along the strap musculature of the neck.

Chronic right maxillary sinusitis. There is atherosclerotic
calcification of the cavernous carotid arteries bilaterally.

CHEST

The peripheral right apical lung mass abutting the pleura is highly
hypermetabolic, with a maximum SUV of 20.4, compatible with
malignancy. Early destruction of the right second rib adjacent to
the mass compatible with chest wall invasion. The mass tracks around
the margins of a right apical bulla. Part of the tumor abuts the
right mediastinal margin.

A lymph node anterior to the carina measures 0.7 cm in short axis
with maximum standard uptake value of 2.0, similar to background
mediastinal activity. No overtly hypermetabolic adenopathy
identified.

Centrilobular emphysema. Mild scarring in the posterior basal
segment right lower lobe.

Calcified posterior pleural plaque on the right.

ABDOMEN/PELVIS

No abnormal hypermetabolic activity within the liver, pancreas,
adrenal glands, or spleen. No hypermetabolic lymph nodes in the
abdomen or pelvis.

Considerable physiologic activity in bowel. Calcifications in the
pancreatic tail favoring chronic calcific pancreatitis.

Aortoiliac atherosclerotic vascular disease. Right superficial
femoral artery stent noted.

High activity focally in the penile urethra with maximum SUV up to
11.7, probably from excreted FDG in the penile urethra considering
the lack of correlating CT finding.

SKELETON

Early destruction of the right second rib due to the adjacent mass.
No other abnormal osseous activity is identified.
IMPRESSION: 1. Highly hypermetabolic right apical Pancoast tumor, maximum SUV
20.4, partially extending around a large right apical bulla. There
is early bony invasion of the right second rib and cortical
destruction indicating local rib/chest wall invasion by tumor.
2. There is additional pleural thickening and pleural calcification
along the right posterior pleural surface which is not separately
hypermetabolic.
3. Small mediastinal lymph nodes are not appreciably hypermetabolic.
4. Multifocal and some diffuse regions of high activity in the bowel
are likely physiologic.
5. No findings of metastatic disease to the neck, abdomen/pelvis, or
to the included remainder of the skeleton.
6. Other imaging findings of potential clinical significance:
Chronic right maxillary sinusitis. Aortic Atherosclerosis
([id number]) and Emphysema ([id number]). Chronic calcific
pancreatitis.
